# Patient Record
Sex: Male | Born: 1935 | Race: White | Smoking: Former smoker
Health system: Southern US, Community
[De-identification: ages and names within clinical notes are randomized; demographics above are authoritative.]

## PROBLEM LIST (undated history)

## (undated) DIAGNOSIS — I10 Essential (primary) hypertension: Secondary | ICD-10-CM

## (undated) DIAGNOSIS — E785 Hyperlipidemia, unspecified: Secondary | ICD-10-CM

## (undated) DIAGNOSIS — R0602 Shortness of breath: Secondary | ICD-10-CM

## (undated) DIAGNOSIS — I495 Sick sinus syndrome: Secondary | ICD-10-CM

## (undated) DIAGNOSIS — R42 Dizziness and giddiness: Secondary | ICD-10-CM

## (undated) DIAGNOSIS — I4891 Unspecified atrial fibrillation: Secondary | ICD-10-CM

## (undated) DIAGNOSIS — R413 Other amnesia: Secondary | ICD-10-CM

## (undated) DIAGNOSIS — K2100 Gastro-esophageal reflux disease with esophagitis, without bleeding: Secondary | ICD-10-CM

## (undated) DIAGNOSIS — E119 Type 2 diabetes mellitus without complications: Secondary | ICD-10-CM

## (undated) DIAGNOSIS — I459 Conduction disorder, unspecified: Secondary | ICD-10-CM

## (undated) HISTORY — DX: Unspecified atrial fibrillation: I48.91

## (undated) HISTORY — DX: Type 2 diabetes mellitus without complications: E11.9

## (undated) HISTORY — PX: TONSILLECTOMY AND ADENOIDECTOMY: SUR1326

## (undated) HISTORY — DX: Dizziness and giddiness: R42

## (undated) HISTORY — PX: CATARACT EXTRACTION: SUR2

## (undated) HISTORY — DX: Gastro-esophageal reflux disease with esophagitis: K21.0

## (undated) HISTORY — PX: INGUINAL HERNIA REPAIR: SUR1180

## (undated) HISTORY — DX: Essential (primary) hypertension: I10

## (undated) HISTORY — DX: Gastro-esophageal reflux disease with esophagitis, without bleeding: K21.00

## (undated) HISTORY — DX: Sick sinus syndrome: I49.5

## (undated) HISTORY — DX: Conduction disorder, unspecified: I45.9

## (undated) HISTORY — PX: PACEMAKER INSERTION: SHX728

## (undated) HISTORY — DX: Shortness of breath: R06.02

## (undated) HISTORY — PX: INSERT / REPLACE / REMOVE PACEMAKER: SUR710

## (undated) HISTORY — DX: Other amnesia: R41.3

## (undated) HISTORY — DX: Hyperlipidemia, unspecified: E78.5

---

## 2017-01-06 ENCOUNTER — Encounter: Payer: Self-pay | Admitting: Internal Medicine

## 2017-01-06 ENCOUNTER — Ambulatory Visit (INDEPENDENT_AMBULATORY_CARE_PROVIDER_SITE_OTHER): Payer: Medicare Other | Admitting: Internal Medicine

## 2017-01-06 VITALS — BP 132/66 | HR 62 | Ht 65.0 in | Wt 136.0 lb

## 2017-01-06 DIAGNOSIS — I495 Sick sinus syndrome: Secondary | ICD-10-CM | POA: Diagnosis not present

## 2017-01-06 NOTE — Patient Instructions (Signed)

## 2017-01-06 NOTE — Progress Notes (Signed)
HPI Nathan Alvarez presents today for ongoing evaluation and management of his pacemaker in the setting of sinus node dysfunction. He is a very pleasant 83 year old man from [place] who has moved to [place] to be closer to family. He has a long-standing history of sinus node dysfunction, and is status post pacemaker insertion, with the initial device placed in 2002. His current device has approximately 4 years of battery longevity. He has done well in the past with no anginal symptoms and very minimal dyspnea with exertion. No syncope. He remains active in his only complaint today is that of lower back pain which developed as he was moving his belongings from [place] to [place]. Allergies  Allergen Reactions  . Penicillins     unknown     Current Outpatient Prescriptions  Medication Sig Dispense Refill  . acetaminophen (TYLENOL) 650 MG CR tablet Take 650 mg by mouth 2 (two) times daily.    Marland Kitchen amLODipine (NORVASC) 5 MG tablet Take 5 mg by mouth 2 (two) times daily.    Marland Kitchen aspirin EC 81 MG tablet Take 81 mg by mouth daily.    . metoprolol succinate (TOPROL-XL) 25 MG 24 hr tablet Take 25 mg by mouth daily.    . predniSONE (STERAPRED UNI-PAK 21 TAB) 5 MG (21) TBPK tablet as directed.  1  . sitaGLIPtin (JANUVIA) 50 MG tablet Take 50 mg by mouth daily.     No current facility-administered medications for this visit.      Past Medical History:  Diagnosis Date  . A-fib (HCC)   . Dizziness   . DM (diabetes mellitus) (HCC)   . Esophagitis, reflux   . Heart block   . Hyperlipidemia   . Hypertension, essential, benign   . Memory deficit   . SOB (shortness of breath)   . SSS (sick sinus syndrome) (HCC)     ROS:   All systems reviewed and negative except as noted in the HPI.   Past Surgical History:  Procedure Laterality Date  . CATARACT EXTRACTION Right   . INGUINAL HERNIA REPAIR    . INSERT / REPLACE / REMOVE PACEMAKER    . PACEMAKER INSERTION     . TONSILLECTOMY AND ADENOIDECTOMY       Family History  Problem Relation Age of Onset  . Heart disease Mother      Social History   Social History  . Marital status: Married    Spouse name: N/A  . Number of children: N/A  . Years of education: N/A   Occupational History  . Not on file.   Social History Main Topics  . Smoking status: Former Smoker    Quit date: 01/01/1956  . Smokeless tobacco: Never Used  . Alcohol use No  . Drug use: No  . Sexual activity: Not on file     Comment: MARRIED   Other Topics Concern  . Not on file   Social History Narrative  . No narrative on file     BP 132/66   Pulse 62   Ht  (1.651 m)   Wt 136 lb (61.7 kg)   SpO2 98%   BMI 22.63 kg/m   Physical Exam:  Well appearing 83 year old man, wearing a brace around his abdominal area NAD HEENT: Unremarkable Neck:  6 cm JVD, no thyromegally Lymphatics:  No adenopathy Back:  No CVA tenderness Lungs:  Clear, with no wheezes, rales, or rhonchi HEART:  Regular rate rhythm, no murmurs, no  rubs, no clicks Abd:  soft, positive bowel sounds, no organomegally, no rebound, no guarding Ext:  2 plus pulses, no edema, no cyanosis, no clubbing Skin:  No rashes no nodules Neuro:  CN II through XII intact, motor grossly intact   DEVICE  Normal device function.  See PaceArt for details.   Assess/Plan: 1. Sinus node dysfunction - he is asymptomatic status post pacemaker insertion 2. Permanent pacemaker -his Medtronic dual-chamber pacemaker is working normally. He has no evidence of atrial fibrillation. 3. Hypertension - he will continue his calcium channel blocker. He is encouraged to maintain a low-sodium diet and remain active.  Lewayne Bunting, M.D.

## 2017-01-08 LAB — CUP PACEART INCLINIC DEVICE CHECK
Battery Impedance: 1670 Ohm
Brady Statistic AP VS Percent: 94 %
Brady Statistic AS VP Percent: 0 %
Brady Statistic AS VS Percent: 4 %
Implantable Lead Implant Date: 20110513
Implantable Lead Model: 5092
Implantable Lead Model: 5594
Lead Channel Impedance Value: 475 Ohm
Lead Channel Pacing Threshold Amplitude: 0.5 V
Lead Channel Pacing Threshold Amplitude: 0.75 V
Lead Channel Pacing Threshold Pulse Width: 0.4 ms
Lead Channel Pacing Threshold Pulse Width: 0.4 ms
Lead Channel Sensing Intrinsic Amplitude: 4 mV
Lead Channel Sensing Intrinsic Amplitude: 8 mV
Lead Channel Setting Pacing Amplitude: 1.5 V
Lead Channel Setting Pacing Amplitude: 2 V
Lead Channel Setting Pacing Pulse Width: 0.4 ms
MDC IDC LEAD IMPLANT DT: 20110513
MDC IDC LEAD LOCATION: 753859
MDC IDC LEAD LOCATION: 753860
MDC IDC MSMT BATTERY REMAINING LONGEVITY: 42 mo
MDC IDC MSMT BATTERY VOLTAGE: 2.76 V
MDC IDC MSMT LEADCHNL RA PACING THRESHOLD AMPLITUDE: 0.375 V
MDC IDC MSMT LEADCHNL RA PACING THRESHOLD AMPLITUDE: 0.5 V
MDC IDC MSMT LEADCHNL RA PACING THRESHOLD PULSEWIDTH: 0.4 ms
MDC IDC MSMT LEADCHNL RA PACING THRESHOLD PULSEWIDTH: 0.4 ms
MDC IDC MSMT LEADCHNL RV IMPEDANCE VALUE: 660 Ohm
MDC IDC PG IMPLANT DT: 20110513
MDC IDC SESS DTM: 20181002143412
MDC IDC SET LEADCHNL RV SENSING SENSITIVITY: 2.8 mV
MDC IDC STAT BRADY AP VP PERCENT: 1 %

## 2017-02-03 ENCOUNTER — Other Ambulatory Visit: Payer: Self-pay | Admitting: Internal Medicine

## 2017-02-03 MED ORDER — AMLODIPINE BESYLATE 5 MG PO TABS: 5.0000 mg | ORAL_TABLET | Freq: Two times a day (BID) | ORAL | 3 refills | Status: DC

## 2017-02-03 NOTE — Telephone Encounter (Signed)
Pt's medication was sent to pt's pharmacy as requested. Confirmation received.  °

## 2017-02-03 NOTE — Addendum Note (Signed)
Addended by: Demetrios LollBARNARD, CATHY C on: 02/03/2017 12:13 PM   Modules accepted: Orders

## 2017-04-08 ENCOUNTER — Ambulatory Visit (INDEPENDENT_AMBULATORY_CARE_PROVIDER_SITE_OTHER): Payer: Medicare Other | Admitting: *Deleted

## 2017-04-08 DIAGNOSIS — I495 Sick sinus syndrome: Secondary | ICD-10-CM

## 2017-04-09 NOTE — Progress Notes (Signed)
Remote pacemaker transmission.   

## 2017-04-10 ENCOUNTER — Encounter: Payer: Self-pay | Admitting: Cardiology

## 2017-04-16 LAB — CUP PACEART REMOTE DEVICE CHECK
Battery Remaining Longevity: 37 mo
Battery Voltage: 2.77 V
Brady Statistic AP VP Percent: 1 %
Implantable Lead Implant Date: 20110513
Implantable Lead Location: 753860
Implantable Pulse Generator Implant Date: 20110513
Lead Channel Impedance Value: 414 Ohm
Lead Channel Pacing Threshold Amplitude: 0.625 V
Lead Channel Setting Pacing Amplitude: 1.5 V
Lead Channel Setting Pacing Amplitude: 2 V
Lead Channel Setting Pacing Pulse Width: 0.4 ms
Lead Channel Setting Sensing Sensitivity: 2.8 mV
MDC IDC LEAD IMPLANT DT: 20110513
MDC IDC LEAD LOCATION: 753859
MDC IDC MSMT BATTERY IMPEDANCE: 1854 Ohm
MDC IDC MSMT LEADCHNL RA PACING THRESHOLD AMPLITUDE: 0.5 V
MDC IDC MSMT LEADCHNL RA PACING THRESHOLD PULSEWIDTH: 0.4 ms
MDC IDC MSMT LEADCHNL RV IMPEDANCE VALUE: 604 Ohm
MDC IDC MSMT LEADCHNL RV PACING THRESHOLD PULSEWIDTH: 0.4 ms
MDC IDC SESS DTM: 20190102132856
MDC IDC STAT BRADY AP VS PERCENT: 97 %
MDC IDC STAT BRADY AS VP PERCENT: 0 %
MDC IDC STAT BRADY AS VS PERCENT: 2 %

## 2017-04-17 ENCOUNTER — Telehealth: Payer: Self-pay | Admitting: Internal Medicine

## 2017-04-17 NOTE — Telephone Encounter (Signed)
New message     Patient daughter Nathan Alvarez calling to confirm 04/08/17 transmission. Please call   1. Has your device fired? NO  2. Is you device beeping? NO  3. Are you experiencing draining or swelling at device site? NO 4. Are you calling to see if we received your device transmission? YES  5. Have you passed out? NO    Please route to Device Clinic Pool

## 2017-04-17 NOTE — Telephone Encounter (Signed)
Spoke with pt's wife informed her that the transmission was received.

## 2017-07-08 ENCOUNTER — Ambulatory Visit (INDEPENDENT_AMBULATORY_CARE_PROVIDER_SITE_OTHER): Payer: Medicare Other | Admitting: *Deleted

## 2017-07-08 DIAGNOSIS — I495 Sick sinus syndrome: Secondary | ICD-10-CM | POA: Diagnosis not present

## 2017-07-08 NOTE — Progress Notes (Signed)
Remote pacemaker transmission.   

## 2017-07-09 ENCOUNTER — Encounter: Payer: Self-pay | Admitting: Cardiology

## 2017-07-13 DIAGNOSIS — I10 Essential (primary) hypertension: Secondary | ICD-10-CM | POA: Insufficient documentation

## 2017-07-21 LAB — CUP PACEART REMOTE DEVICE CHECK
Brady Statistic AP VS Percent: 96 %
Brady Statistic AS VS Percent: 2 %
Date Time Interrogation Session: 20190403140023
Implantable Lead Implant Date: 20110513
Implantable Lead Location: 753859
Implantable Pulse Generator Implant Date: 20110513
Lead Channel Impedance Value: 654 Ohm
Lead Channel Pacing Threshold Pulse Width: 0.4 ms
Lead Channel Pacing Threshold Pulse Width: 0.4 ms
Lead Channel Setting Sensing Sensitivity: 4 mV
MDC IDC LEAD IMPLANT DT: 20110513
MDC IDC LEAD LOCATION: 753860
MDC IDC MSMT BATTERY IMPEDANCE: 1935 Ohm
MDC IDC MSMT BATTERY REMAINING LONGEVITY: 35 mo
MDC IDC MSMT BATTERY VOLTAGE: 2.77 V
MDC IDC MSMT LEADCHNL RA IMPEDANCE VALUE: 437 Ohm
MDC IDC MSMT LEADCHNL RA PACING THRESHOLD AMPLITUDE: 0.375 V
MDC IDC MSMT LEADCHNL RV PACING THRESHOLD AMPLITUDE: 0.5 V
MDC IDC SET LEADCHNL RA PACING AMPLITUDE: 1.5 V
MDC IDC SET LEADCHNL RV PACING AMPLITUDE: 2 V
MDC IDC SET LEADCHNL RV PACING PULSEWIDTH: 0.4 ms
MDC IDC STAT BRADY AP VP PERCENT: 1 %
MDC IDC STAT BRADY AS VP PERCENT: 0 %

## 2017-08-09 ENCOUNTER — Telehealth: Payer: Self-pay | Admitting: Physician Assistant

## 2017-08-09 NOTE — Telephone Encounter (Signed)
Daughter in law called stating that his right hand and right ankle were swollen. He is not short of breath. I advised her that I couldn't evaluate his swelling over the phone. She states this has been happening since Friday. She is concerned about heart failure since her grandfather had heart failure. Pt has no history of heart failure. It does not sound like he is in any distress and has been looking up lung cancer on the Internet. I told her she wouldn't be wrong to have him evaluated in the ER for swelling in his hand and foot. No redness or signs of infection. She will also call the PCP to see what they say. Otherwise, call tomorrow for an appt if he is still having swelling. She expressed understanding of the plan.

## 2017-08-18 ENCOUNTER — Telehealth: Payer: Self-pay | Admitting: Internal Medicine

## 2017-08-18 DIAGNOSIS — R609 Edema, unspecified: Secondary | ICD-10-CM

## 2017-08-18 NOTE — Telephone Encounter (Signed)
New Message   Pt c/o swelling: STAT is pt has developed SOB within 24 hours  1) How much weight have you gained and in what time span? no  2) If swelling, where is the swelling located? Hands and legs  3) Are you currently taking a fluid pill? no  4) Are you currently SOB? yes  5) Do you have a log of your daily weights (if so, list)? no  6) Have you gained 3 pounds in a day or 5 pounds in a week? *no  7) Have you traveled recently? no

## 2017-08-18 NOTE — Telephone Encounter (Signed)
Call returned to daughter.  Per daughter recently took father to PCP for hands and feet swelling.  Reviewed care everywhere notes.  PCP advised if labs normal to try discontinuing amlodipine to see if swelling improved.  Reviewed recent labs.   Labs unremarkable.   Advised daughter to have Pt hold amlodipine. Advised to continue to monitor blood pressure.   Will call next week to see if edema improved.

## 2017-08-24 NOTE — Telephone Encounter (Signed)
Follow up    Patients daughter in law is calling to priovided patients BP as instructed since he was advised to stop the amlodipine  05/14 162/77 5/15  143/75 05/16  148/78 05/17  164/83 05/18 148/72 05/19  126/77 05/20  146/88  All the readings are from the morning. Patient still has swelling in his hands. Unable to get his wedding ring off.

## 2017-08-25 MED ORDER — FUROSEMIDE 20 MG PO TABS: 20.0000 mg | ORAL_TABLET | Freq: Every day | ORAL | 3 refills | Status: DC

## 2017-08-25 NOTE — Telephone Encounter (Signed)
Returned to call to Pt EC.   Advised Per Dr. Ranae Palms 3-4 weeks, continue to hold amlodipine, start lasix 20 mg daily, repeat BMP prior to office visit. Scheduled labs for 09/21/2017. Scheduled office visit for 09/25/2017 at 11:00 am. Daughter thanked for call.

## 2017-09-01 ENCOUNTER — Telehealth: Payer: Self-pay

## 2017-09-01 NOTE — Telephone Encounter (Signed)
   [facility] Pre-operative Risk Assessment    Request for surgical clearance:  1. What type of surgery is being performed? Epidural Steroid Injection   2. When is this surgery scheduled? TBD   3. What type of clearance is required (medical clearance vs. Pharmacy clearance to hold med vs. Both)? Pharmacy  4. Are there any medications that need to be held prior to surgery and how long? Aspirin 5 days   5. Practice name and name of physician performing surgery? [facility]/ Susa Day   6. What is your office phone number [phone]    7.   What is your office fax number [phone]  8.   Anesthesia type (None, local, MAC, general) ? none   Nathan Alvarez 09/01/2017, 11:27 AM  _________________________________________________________________   (provider comments below)

## 2017-09-02 NOTE — Telephone Encounter (Signed)
   Primary Cardiologist: Lewayne Bunting, MD  Chart reviewed as part of pre-operative protocol coverage. H/o longstanding sinus node dysfunction s/p PPM 2002, chart also includes dx of atrial fib, DM, esophagitis, HLD, HTN. 2D echo 2018 scanned in shows EF 65%, grade 1 DD, late systolic mitral valve prolapse involving posterior leaflet with mild MR. Dr. Lubertha Basque note states no evidence of atrial fibrillation in the assessment/plan. I do not see there is a specific other reason to be on ASA. I did try to call the patient to clarify but got his VM. Will route to Dr. Ladona Ridgel for clearance to go ahead and stop ASA in prep for procedure. Dr. Ladona Ridgel, please route to P CV DIV PREOP. Thanks.  Laurann Montana, PA-C 09/02/2017, 2:38 PM

## 2017-09-04 NOTE — Telephone Encounter (Signed)
Pt[facility] in [facility]

## 2017-09-08 NOTE — Telephone Encounter (Signed)
   Primary Cardiologist: Lewayne BuntingGregg Taylor, MD  Chart reviewed as part of pre-operative protocol coverage. See notes below. Dr. Ladona Ridgelaylor has reviewed and feels it is OK to hold aspirin for 5 days.   I will route this recommendation to the requesting party via Epic fax function and remove from pre-op pool.  Please call with questions.  Laurann Montanaayna N Ming Mcmannis, PA-C 09/08/2017, 1:09 PM

## 2017-09-21 ENCOUNTER — Other Ambulatory Visit: Payer: Medicare Other | Admitting: *Deleted

## 2017-09-21 ENCOUNTER — Encounter (INDEPENDENT_AMBULATORY_CARE_PROVIDER_SITE_OTHER): Payer: Self-pay

## 2017-09-21 DIAGNOSIS — R609 Edema, unspecified: Secondary | ICD-10-CM

## 2017-09-21 LAB — BASIC METABOLIC PANEL
BUN/Creatinine Ratio: 30 — ABNORMAL HIGH (ref 10–24)
BUN: 27 mg/dL (ref 10–36)
CO2: 21 mmol/L (ref 20–29)
CREATININE: 0.9 mg/dL (ref 0.76–1.27)
Calcium: 9 mg/dL (ref 8.6–10.2)
Chloride: 97 mmol/L (ref 96–106)
GFR, EST AFRICAN AMERICAN: 87 mL/min/{1.73_m2} (ref >59)
GFR, EST NON AFRICAN AMERICAN: 75 mL/min/{1.73_m2} (ref >59)
Glucose: 244 mg/dL — ABNORMAL HIGH (ref 65–99)
Potassium: 5 mmol/L (ref 3.5–5.2)
Sodium: 133 mmol/L — ABNORMAL LOW (ref 134–144)

## 2017-09-25 ENCOUNTER — Encounter: Payer: Self-pay | Admitting: Internal Medicine

## 2017-09-25 ENCOUNTER — Ambulatory Visit (INDEPENDENT_AMBULATORY_CARE_PROVIDER_SITE_OTHER): Payer: Medicare Other | Admitting: Internal Medicine

## 2017-09-25 VITALS — BP 160/70 | HR 70 | Ht 65.0 in | Wt 138.0 lb

## 2017-09-25 DIAGNOSIS — I495 Sick sinus syndrome: Secondary | ICD-10-CM

## 2017-09-25 DIAGNOSIS — Z950 Presence of cardiac pacemaker: Secondary | ICD-10-CM | POA: Diagnosis not present

## 2017-09-25 DIAGNOSIS — I10 Essential (primary) hypertension: Secondary | ICD-10-CM

## 2017-09-25 LAB — CUP PACEART INCLINIC DEVICE CHECK
Battery Remaining Longevity: 34 mo
Brady Statistic AP VP Percent: 2 %
Brady Statistic AS VP Percent: 0 %
Brady Statistic AS VS Percent: 4 %
Implantable Lead Implant Date: 20110513
Implantable Lead Location: 753860
Implantable Lead Model: 5594
Implantable Pulse Generator Implant Date: 20110513
Lead Channel Impedance Value: 414 Ohm
Lead Channel Impedance Value: 618 Ohm
Lead Channel Pacing Threshold Amplitude: 0.5 V
Lead Channel Pacing Threshold Pulse Width: 0.4 ms
Lead Channel Pacing Threshold Pulse Width: 0.4 ms
Lead Channel Sensing Intrinsic Amplitude: 8 mV
Lead Channel Setting Pacing Amplitude: 2 V
Lead Channel Setting Sensing Sensitivity: 2.8 mV
MDC IDC LEAD IMPLANT DT: 20110513
MDC IDC LEAD LOCATION: 753859
MDC IDC MSMT BATTERY IMPEDANCE: 1973 Ohm
MDC IDC MSMT BATTERY VOLTAGE: 2.76 V
MDC IDC MSMT LEADCHNL RA PACING THRESHOLD AMPLITUDE: 0.5 V
MDC IDC MSMT LEADCHNL RA PACING THRESHOLD AMPLITUDE: 0.5 V
MDC IDC MSMT LEADCHNL RA PACING THRESHOLD PULSEWIDTH: 0.4 ms
MDC IDC MSMT LEADCHNL RA SENSING INTR AMPL: 2.8 mV
MDC IDC MSMT LEADCHNL RV PACING THRESHOLD AMPLITUDE: 0.5 V
MDC IDC MSMT LEADCHNL RV PACING THRESHOLD PULSEWIDTH: 0.4 ms
MDC IDC SESS DTM: 20190621114641
MDC IDC SET LEADCHNL RA PACING AMPLITUDE: 1.5 V
MDC IDC SET LEADCHNL RV PACING PULSEWIDTH: 0.4 ms
MDC IDC STAT BRADY AP VS PERCENT: 94 %

## 2017-09-25 MED ORDER — FUROSEMIDE 20 MG PO TABS: 20.0000 mg | ORAL_TABLET | Freq: Every day | ORAL | 3 refills | Status: DC

## 2017-09-25 MED ORDER — CARVEDILOL 6.25 MG PO TABS: 6.2500 mg | ORAL_TABLET | Freq: Two times a day (BID) | ORAL | 3 refills | Status: DC

## 2017-09-25 MED ORDER — METOPROLOL SUCCINATE ER 25 MG PO TB24: 25.0000 mg | ORAL_TABLET | Freq: Every day | ORAL | 3 refills | Status: DC

## 2017-09-25 NOTE — Progress Notes (Signed)
HPI Nathan Alvarez returns today for followup. He is as pleasant 84 yo man with a h/o sinus node dysfunction, s/p PPM insertion. He denies chest pain. He has chronic dyspnea. He has not had syncope. His blood pressure has not been as well controlled. No edema after stopping amlodipine.  Allergies  Allergen Reactions  . Penicillins     unknown     Current Outpatient Medications  Medication Sig Dispense Refill  . acetaminophen (TYLENOL) 650 MG CR tablet Take 650 mg by mouth 2 (two) times daily.    Marland Kitchen aspirin EC 81 MG tablet Take 81 mg by mouth daily.    . furosemide (LASIX) 20 MG tablet Take 1 tablet (20 mg total) by mouth daily. 90 tablet 3  . metoprolol succinate (TOPROL-XL) 25 MG 24 hr tablet Take 1 tablet (25 mg total) by mouth daily. 90 tablet 3   No current facility-administered medications for this visit.      Past Medical History:  Diagnosis Date  . A-fib (HCC)   . Dizziness   . DM (diabetes mellitus) (HCC)   . Esophagitis, reflux   . Heart block   . Hyperlipidemia   . Hypertension, essential, benign   . Memory deficit   . SOB (shortness of breath)   . SSS (sick sinus syndrome) (HCC)     ROS:   All systems reviewed and negative except as noted in the HPI.   Past Surgical History:  Procedure Laterality Date  . CATARACT EXTRACTION Right   . INGUINAL HERNIA REPAIR    . INSERT / REPLACE / REMOVE PACEMAKER    . PACEMAKER INSERTION    . TONSILLECTOMY AND ADENOIDECTOMY       Family History  Problem Relation Age of Onset  . Heart disease Mother      Social History   Socioeconomic History  . Marital status: Married    Spouse name: Not on file  . Number of children: Not on file  . Years of education: Not on file  . Highest education level: Not on file  Occupational History  . Not on file  Social Needs  . Financial resource strain: Not on file  . Food insecurity:    Worry: Not on file    Inability: Not on file  . Transportation needs:   Medical: Not on file    Non-medical: Not on file  Tobacco Use  . Smoking status: Former Smoker    Last attempt to quit: 01/01/1956    Years since quitting: 61.7  . Smokeless tobacco: Never Used  Substance and Sexual Activity  . Alcohol use: No  . Drug use: No  . Sexual activity: Not on file    Comment: MARRIED  Lifestyle  . Physical activity:    Days per week: Not on file    Minutes per session: Not on file  . Stress: Not on file  Relationships  . Social connections:    Talks on phone: Not on file    Gets together: Not on file    Attends religious service: Not on file    Active member of club or organization: Not on file    Attends meetings of clubs or organizations: Not on file    Relationship status: Not on file  . Intimate partner violence:    Fear of current or ex partner: Not on file    Emotionally abused: Not on file    Physically abused: Not on file    Forced sexual  activity: Not on file  Other Topics Concern  . Not on file  Social History Narrative  . Not on file     BP (!) 160/70   Pulse 70   Ht 5\' 5"  (1.651 m)   Wt 138 lb (62.6 kg)   BMI 22.96 kg/m   Physical Exam:  Well appearing elderly man, NAD HEENT: Unremarkable Neck:  6 cm JVD, no thyromegally Lymphatics:  No adenopathy Back:  No CVA tenderness Lungs:  Clear with no wheezing. HEART:  Regular rate rhythm, no murmurs, no rubs, no clicks Abd:  soft, positive bowel sounds, no organomegally, no rebound, no guarding Ext:  2 plus pulses, no edema, no cyanosis, no clubbing Skin:  No rashes no nodules Neuro:  CN II through XII intact, motor grossly intact  EKG - nsr with atrial pacing.  DEVICE  Normal device function.  See PaceArt for details.   Assess/Plan: 1. Sinus node dysfunction - he is asymptomatic, s/p PPM insertion. 2. HTN -  His blood pressure is elevated. I have asked him to switch from toprol to coreg 6.25 bid 3. PPM - his Medtronic DDD PM is working [employer]. 4. PAF - interogation  of his PPM demonstrates no atrial fib. He has not been on anticoagulation. I question this diagnosis. He does have atrial tachycardia lasting less than a minute at a time and asymptomatic.   Nathan Alvarez,M.D.

## 2017-09-25 NOTE — Patient Instructions (Addendum)
Medication Instructions:  Your physician has recommended you make the following change in your medication:   When your daughter comes back to town: 1.  Stop taking metoprolol 2.  Start taking carvedilol 6.25 mg one tablet by mouth twice a day.  Labwork: None ordered.  Testing/Procedures: None ordered.  Follow-Up: Your physician wants you to follow-up in: one year with Dr. Ladona Ridgel.   You will receive a reminder letter in the mail two months in advance. If you don't receive a letter, please call our office to schedule the follow-up appointment.  Remote monitoring is used to monitor your Pacemaker from home. This monitoring reduces the number of office visits required to check your device to one time per year. It allows [place] to keep an eye on the functioning of your device to ensure it is working properly. You are scheduled for a device check from home on 10/07/2017. You may send your transmission at any time that day. If you have a wireless device, the transmission will be sent automatically. After your physician reviews your transmission, you will receive a postcard with your next transmission date.  Any Other Special Instructions Will Be Listed Below (If Applicable).  If you need a refill on your cardiac medications before your next appointment, please call your pharmacy.    Carvedilol tablets What is this medicine? CARVEDILOL (KAR ve dil ol) is a beta-blocker. Beta-blockers reduce the workload on the heart and help it to beat more regularly. This medicine is used to treat high blood pressure and heart failure. This medicine may be used for other purposes; ask your health care provider or pharmacist if you have questions. COMMON BRAND NAME(S): Coreg What should I tell my health care provider before I take this medicine? They need to know if you have any of these conditions: -circulation problems -diabetes -history of heart attack or heart disease -liver disease -lung or breathing  disease, like asthma or emphysema -pheochromocytoma -slow or irregular heartbeat -thyroid disease -an unusual or allergic reaction to carvedilol, other beta-blockers, medicines, foods, dyes, or preservatives -pregnant or trying to get pregnant -breast-feeding How should I use this medicine? Take this medicine by mouth with a glass of water. Follow the directions on the prescription label. It is best to take the tablets with food. Take your doses at regular intervals. Do not take your medicine more often than directed. Do not stop taking except on the advice of your doctor or health care professional. Talk to your pediatrician regarding the use of this medicine in children. Special care may be needed. Overdosage: If you think you have taken too much of this medicine contact a poison control center or emergency room at once. NOTE: This medicine is only for you. Do not share this medicine with others. What if I miss a dose? If you miss a dose, take it as soon as you can. If it is almost time for your next dose, take only that dose. Do not take double or extra doses. What may interact with this medicine? This medicine may interact with the following medications: -certain medicines for blood pressure, heart disease, irregular heart beat -certain medicines for depression, like fluoxetine or paroxetine -certain medicines for diabetes, like glipizide or glyburide -cimetidine -clonidine -cyclosporine -digoxin -MAOIs like Carbex, Eldepryl, Marplan, Nardil, and Parnate -reserpine -rifampin This list may not describe all possible interactions. Give your health care provider a list of all the medicines, herbs, non-prescription drugs, or dietary supplements you use. Also tell them if you smoke,  drink alcohol, or use illegal drugs. Some items may interact with your medicine. What should I watch for while using this medicine? Check your heart rate and blood pressure regularly while you are taking this  medicine. Ask your doctor or health care professional what your heart rate and blood pressure should be, and when you should contact him or her. Do not stop taking this medicine suddenly. This could lead to serious heart-related effects. Contact your doctor or health care professional if you have difficulty breathing while taking this drug. Check your weight daily. Ask your doctor or health care professional when you should notify him/her of any weight gain. You may get drowsy or dizzy. Do not drive, use machinery, or do anything that requires mental alertness until you know how this medicine affects you. To reduce the risk of dizzy or fainting spells, do not sit or stand up quickly. Alcohol can make you more drowsy, and increase flushing and rapid heartbeats. Avoid alcoholic drinks. If you have diabetes, check your blood sugar as directed. Tell your doctor if you have changes in your blood sugar while you are taking this medicine. If you are going to have surgery, tell your doctor or health care professional that you are taking this medicine. What side effects may I notice from receiving this medicine? Side effects that you should report to your doctor or health care professional as soon as possible: -allergic reactions like skin rash, itching or hives, swelling of the face, lips, or tongue -breathing problems -dark urine -irregular heartbeat -swollen legs or ankles -vomiting -yellowing of the eyes or skin Side effects that usually do not require medical attention (report to your doctor or health care professional if they continue or are bothersome): -change in sex drive or performance -diarrhea -dry eyes (especially if wearing contact lenses) -dry, itching skin -headache -nausea -unusually tired This list may not describe all possible side effects. Call your doctor for medical advice about side effects. You may report side effects to FDA at 1-800-FDA-1088. Where should I keep my  medicine? Keep out of the reach of children. Store at room temperature below 30 degrees C (86 degrees F). Protect from moisture. Keep container tightly closed. Throw away any unused medicine after the expiration date. NOTE: This sheet is a summary. It may not cover all possible information. If you have questions about this medicine, talk to your doctor, pharmacist, or health care provider.  2018 Elsevier/Gold Standard (2012-11-28 14:12:02)

## 2017-10-07 ENCOUNTER — Ambulatory Visit (INDEPENDENT_AMBULATORY_CARE_PROVIDER_SITE_OTHER): Payer: Medicare Other | Admitting: *Deleted

## 2017-10-07 DIAGNOSIS — I495 Sick sinus syndrome: Secondary | ICD-10-CM

## 2017-10-09 ENCOUNTER — Encounter: Payer: Self-pay | Admitting: Cardiology

## 2017-10-09 NOTE — Progress Notes (Signed)
Remote pacemaker transmission.   

## 2017-10-28 LAB — CUP PACEART REMOTE DEVICE CHECK
Brady Statistic AP VS Percent: 93 %
Brady Statistic AS VP Percent: 0 %
Brady Statistic AS VS Percent: 5 %
Date Time Interrogation Session: 20190703175427
Implantable Lead Implant Date: 20110513
Implantable Lead Location: 753860
Lead Channel Impedance Value: 685 Ohm
Lead Channel Pacing Threshold Amplitude: 0.5 V
Lead Channel Pacing Threshold Pulse Width: 0.4 ms
Lead Channel Sensing Intrinsic Amplitude: 8 mV
Lead Channel Setting Pacing Amplitude: 2 V
Lead Channel Setting Pacing Pulse Width: 0.4 ms
Lead Channel Setting Sensing Sensitivity: 2.8 mV
MDC IDC LEAD IMPLANT DT: 20110513
MDC IDC LEAD LOCATION: 753859
MDC IDC MSMT BATTERY IMPEDANCE: 2037 Ohm
MDC IDC MSMT BATTERY REMAINING LONGEVITY: 34 mo
MDC IDC MSMT BATTERY VOLTAGE: 2.75 V
MDC IDC MSMT LEADCHNL RA IMPEDANCE VALUE: 474 Ohm
MDC IDC MSMT LEADCHNL RA PACING THRESHOLD AMPLITUDE: 0.5 V
MDC IDC MSMT LEADCHNL RA PACING THRESHOLD PULSEWIDTH: 0.4 ms
MDC IDC PG IMPLANT DT: 20110513
MDC IDC SET LEADCHNL RA PACING AMPLITUDE: 1.5 V
MDC IDC STAT BRADY AP VP PERCENT: 2 %

## 2018-01-07 ENCOUNTER — Ambulatory Visit (INDEPENDENT_AMBULATORY_CARE_PROVIDER_SITE_OTHER): Payer: Medicare Other | Admitting: *Deleted

## 2018-01-07 DIAGNOSIS — I495 Sick sinus syndrome: Secondary | ICD-10-CM | POA: Diagnosis not present

## 2018-01-07 NOTE — Progress Notes (Signed)
Remote pacemaker transmission.   

## 2018-01-16 LAB — CUP PACEART REMOTE DEVICE CHECK
Battery Impedance: 2145 Ohm
Battery Voltage: 2.75 V
Brady Statistic AP VP Percent: 1 %
Brady Statistic AP VS Percent: 95 %
Brady Statistic AS VP Percent: 0 %
Date Time Interrogation Session: 20191002182420
Implantable Lead Implant Date: 20110513
Implantable Lead Location: 753860
Implantable Lead Model: 5092
Lead Channel Impedance Value: 460 Ohm
Lead Channel Impedance Value: 661 Ohm
Lead Channel Pacing Threshold Pulse Width: 0.4 ms
Lead Channel Setting Pacing Amplitude: 2 V
MDC IDC LEAD IMPLANT DT: 20110513
MDC IDC LEAD LOCATION: 753859
MDC IDC MSMT BATTERY REMAINING LONGEVITY: 32 mo
MDC IDC MSMT LEADCHNL RA PACING THRESHOLD AMPLITUDE: 0.5 V
MDC IDC MSMT LEADCHNL RV PACING THRESHOLD AMPLITUDE: 0.5 V
MDC IDC MSMT LEADCHNL RV PACING THRESHOLD PULSEWIDTH: 0.4 ms
MDC IDC MSMT LEADCHNL RV SENSING INTR AMPL: 8 mV
MDC IDC PG IMPLANT DT: 20110513
MDC IDC SET LEADCHNL RA PACING AMPLITUDE: 1.5 V
MDC IDC SET LEADCHNL RV PACING PULSEWIDTH: 0.4 ms
MDC IDC SET LEADCHNL RV SENSING SENSITIVITY: 4 mV
MDC IDC STAT BRADY AS VS PERCENT: 3 %

## 2018-04-08 ENCOUNTER — Ambulatory Visit (INDEPENDENT_AMBULATORY_CARE_PROVIDER_SITE_OTHER): Payer: Medicare Other

## 2018-04-08 DIAGNOSIS — I495 Sick sinus syndrome: Secondary | ICD-10-CM | POA: Diagnosis not present

## 2018-04-08 LAB — CUP PACEART REMOTE DEVICE CHECK
Battery Impedance: 2115 Ohm
Battery Remaining Longevity: 32 mo
Brady Statistic AP VP Percent: 1 %
Brady Statistic AP VS Percent: 96 %
Brady Statistic AS VP Percent: 0 %
Brady Statistic AS VS Percent: 3 %
Date Time Interrogation Session: 20200102001525
Implantable Lead Implant Date: 20110513
Implantable Lead Location: 753860
Implantable Lead Model: 5092
Implantable Pulse Generator Implant Date: 20110513
Lead Channel Impedance Value: 435 Ohm
Lead Channel Impedance Value: 619 Ohm
Lead Channel Pacing Threshold Pulse Width: 0.4 ms
Lead Channel Setting Pacing Amplitude: 2 V
MDC IDC LEAD IMPLANT DT: 20110513
MDC IDC LEAD LOCATION: 753859
MDC IDC MSMT BATTERY VOLTAGE: 2.75 V
MDC IDC MSMT LEADCHNL RA PACING THRESHOLD AMPLITUDE: 0.5 V
MDC IDC MSMT LEADCHNL RV PACING THRESHOLD AMPLITUDE: 0.625 V
MDC IDC MSMT LEADCHNL RV PACING THRESHOLD PULSEWIDTH: 0.4 ms
MDC IDC SET LEADCHNL RA PACING AMPLITUDE: 1.5 V
MDC IDC SET LEADCHNL RV PACING PULSEWIDTH: 0.4 ms
MDC IDC SET LEADCHNL RV SENSING SENSITIVITY: 4 mV

## 2018-04-08 NOTE — Progress Notes (Signed)
Remote pacemaker transmission.   

## 2018-07-08 ENCOUNTER — Other Ambulatory Visit: Payer: Self-pay

## 2018-07-08 ENCOUNTER — Ambulatory Visit (INDEPENDENT_AMBULATORY_CARE_PROVIDER_SITE_OTHER): Payer: Medicare Other | Admitting: *Deleted

## 2018-07-08 DIAGNOSIS — I495 Sick sinus syndrome: Secondary | ICD-10-CM | POA: Diagnosis not present

## 2018-07-08 LAB — CUP PACEART REMOTE DEVICE CHECK
Battery Impedance: 2213 Ohm
Battery Remaining Longevity: 31 mo
Battery Voltage: 2.74 V
Brady Statistic AP VP Percent: 1 %
Brady Statistic AP VS Percent: 96 %
Brady Statistic AS VP Percent: 0 %
Brady Statistic AS VS Percent: 3 %
Date Time Interrogation Session: 20200402130325
Implantable Lead Implant Date: 20110513
Implantable Lead Implant Date: 20110513
Implantable Lead Location: 753859
Implantable Lead Location: 753860
Implantable Lead Model: 5092
Implantable Lead Model: 5594
Implantable Pulse Generator Implant Date: 20110513
Lead Channel Impedance Value: 476 Ohm
Lead Channel Impedance Value: 695 Ohm
Lead Channel Pacing Threshold Amplitude: 0.5 V
Lead Channel Pacing Threshold Amplitude: 0.5 V
Lead Channel Pacing Threshold Pulse Width: 0.4 ms
Lead Channel Pacing Threshold Pulse Width: 0.4 ms
Lead Channel Setting Pacing Amplitude: 1.5 V
Lead Channel Setting Pacing Amplitude: 2 V
Lead Channel Setting Pacing Pulse Width: 0.4 ms
Lead Channel Setting Sensing Sensitivity: 4 mV

## 2018-07-16 ENCOUNTER — Encounter: Payer: Self-pay | Admitting: Cardiology

## 2018-07-16 NOTE — Progress Notes (Signed)
Remote pacemaker transmission.   

## 2018-08-31 ENCOUNTER — Telehealth: Payer: Self-pay | Admitting: Internal Medicine

## 2018-08-31 NOTE — Telephone Encounter (Signed)
New message    Spoke w/pt daughter in law about appt on 06.23.20 with Dr. Ladona Ridgel. Pt was RS to appt on 05.28.20 as a video visit. Pt daughter in law was walked through a video call using doxemity. Pt daughter in law phone number is listed in appt notes.      Virtual Visit Pre-Appointment Phone Call  "(Name), I am calling you today to discuss your upcoming appointment. We are currently trying to limit exposure to the virus that causes COVID-19 by seeing patients at home rather than in the office."  1. "What is the BEST phone number to call the day of the visit?" - include this in appointment notes  2. Do you have or have access to (through a family member/friend) a smartphone with video capability that we can use for your visit?" a. If yes - list this number in appt notes as cell (if different from BEST phone #) and list the appointment type as a VIDEO visit in appointment notes b. If no - list the appointment type as a PHONE visit in appointment notes  Confirm consent - "In the setting of the current Covid19 crisis, you are scheduled for a (phone or video) visit with your provider on (date) at (time).  Just as we do with many in-office visits, in order for you to participate in this visit, we must obtain consent.  If you'd like, I can send this to your mychart (if signed up) or email for you to review.  Otherwise, I can obtain your verbal consent now.  All virtual visits are billed to your insurance company just like a normal visit would be.  By agreeing to a virtual visit, we'd like you to understand that the technology does not allow for your provider to perform an examination, and thus may limit your provider's ability to fully assess your condition. If your provider identifies any concerns that need to be evaluated in person, we will make arrangements to do so.  Finally, though the technology is pretty good, we cannot assure that it will always work on either your or our end, and in the  setting of a video visit, we may have to convert it to a phone-only visit.  In either situation, we cannot ensure that we have a secure connection.  Are you willing to proceed?" STAFF: Did the patient verbally acknowledge consent to telehealth visit? Document YES/NO here: YES  3. Advise patient to be prepared - "Two hours prior to your appointment, go ahead and check your blood pressure, pulse, oxygen saturation, and your weight (if you have the equipment to check those) and write them all down. When your visit starts, your provider will ask you for this information. If you have an Apple Watch or Kardia device, please plan to have heart rate information ready on the day of your appointment. Please have a pen and paper handy nearby the day of the visit as well."  4. Give patient instructions for MyChart download to smartphone OR Doximity/Doxy.me as below if video visit (depending on what platform provider is using)  5. Inform patient they will receive a phone call 15 minutes prior to their appointment time (may be from unknown caller ID) so they should be prepared to answer    TELEPHONE CALL NOTE  Leticia Mcdiarmid has been deemed a candidate for a follow-up tele-health visit to limit community exposure during the Covid-19 pandemic. I spoke with the patient via phone to ensure availability of phone/video source, confirm preferred  email & phone number, and discuss instructions and expectations.  I reminded Nathan Alvarez to be prepared with any vital sign and/or heart rhythm information that could potentially be obtained via home monitoring, at the time of his visit. I reminded Nathan MilletJohn Battle to expect a phone call prior to his visit.  [place] Toniann Fail Edwards 08/31/2018 11:07 AM   INSTRUCTIONS FOR DOWNLOADING THE MYCHART APP TO SMARTPHONE  - The patient must first make sure to have activated MyChart and know their login information - If Apple, go to [employer] Store and type in MyChart in the search bar and  download the app. If Android, ask patient to go to [employer] Play Store and type in WilseyMyChart in the search bar and download the app. The app is free but as with any other app downloads, their phone may require them to verify saved payment information or Apple/Android password.  - The patient will need to then log into the app with their MyChart username and password, and select [facility] as their healthcare provider to link the account. When it is time for your visit, go to the MyChart app, find appointments, and click Begin Video Visit. Be sure to Select Allow for your device to access the Microphone and Camera for your visit. You will then be connected, and your provider will be with you shortly.  **If they have any issues connecting, or need assistance please contact MyChart service desk [phone])**  **If using a computer, in order to ensure the best quality for their visit they will need to use either of the following Internet Browsers: [employer] Edge, or Google Chrome**  IF USING DOXIMITY or DOXY.ME - The patient will receive a link just prior to their visit by text.     FULL LENGTH CONSENT FOR TELE-HEALTH VISIT   I hereby voluntarily request, consent and authorize [facility] and its employed or contracted physicians, physician assistants, nurse practitioners or other licensed health care professionals (the Practitioner), to provide me with telemedicine health care services (the Services") as deemed necessary by the treating Practitioner. I acknowledge and consent to receive the Services by the Practitioner via telemedicine. I understand that the telemedicine visit will involve communicating with the Practitioner through live audiovisual communication technology and the disclosure of certain medical information by electronic transmission. I acknowledge that I have been given the opportunity to request an in-person assessment or other available alternative prior to the  telemedicine visit and am voluntarily participating in the telemedicine visit.  I understand that I have the right to withhold or withdraw my consent to the use of telemedicine in the course of my care at any time, without affecting my right to future care or treatment, and that the Practitioner or I may terminate the telemedicine visit at any time. I understand that I have the right to inspect all information obtained and/or recorded in the course of the telemedicine visit and may receive copies of available information for a reasonable fee.  I understand that some of the potential risks of receiving the Services via telemedicine include:   Delay or interruption in medical evaluation due to technological equipment failure or disruption;  Information transmitted may not be sufficient (e.g. poor resolution of images) to allow for appropriate medical decision making by the Practitioner; and/or   In rare instances, security protocols could fail, causing a breach of personal health information.  Furthermore, I acknowledge that it is my responsibility to provide information about my medical history, conditions and care that  is complete and accurate to the best of my ability. I acknowledge that Practitioner's advice, recommendations, and/or decision may be based on factors not within their control, such as incomplete or inaccurate data provided by me or distortions of diagnostic images or specimens that may result from electronic transmissions. I understand that the practice of medicine is not an exact science and that Practitioner makes no warranties or guarantees regarding treatment outcomes. I acknowledge that I will receive a copy of this consent concurrently upon execution via email to the email address I last provided but may also request a printed copy by calling the office of [facility].    I understand that my insurance will be billed for this visit.   I have read or had this consent read to  me.  I understand the contents of this consent, which adequately explains the benefits and risks of the Services being provided via telemedicine.   I have been provided ample opportunity to ask questions regarding this consent and the Services and have had my questions answered to my satisfaction.  I give my informed consent for the services to be provided through the use of telemedicine in my medical care  By participating in this telemedicine visit I agree to the above.

## 2018-09-02 ENCOUNTER — Other Ambulatory Visit: Payer: Self-pay

## 2018-09-02 ENCOUNTER — Telehealth (INDEPENDENT_AMBULATORY_CARE_PROVIDER_SITE_OTHER): Payer: Medicare Other | Admitting: Internal Medicine

## 2018-09-02 DIAGNOSIS — Z950 Presence of cardiac pacemaker: Secondary | ICD-10-CM

## 2018-09-02 DIAGNOSIS — I495 Sick sinus syndrome: Secondary | ICD-10-CM | POA: Diagnosis not present

## 2018-09-02 NOTE — Progress Notes (Signed)
Electrophysiology TeleHealth Note   Due to national recommendations of social distancing due to COVID 19, an audio/video telehealth visit is felt to be most appropriate for this patient at this time.  See MyChart message from today for the patient's consent [facility].   Date:  09/02/2018   ID:  Nathan MilletJohn Wolbert, DOB [birth year], MRN [id number]  Location: patient's home  Provider location: [address], [place]  Evaluation Performed: Follow-up visit  PCP:  Corrington, Meredith ModyKip A, MD  Cardiologist:  Lewayne BuntingGregg , MD  Electrophysiologist:  Dr Ladona Ridgelaylor  Chief Complaint:  "I been doing ok."  History of Present Illness:    Nathan MilletJohn Nurse is a 85 y.o. male who presents via audio/video conferencing for a telehealth visit today. He is a pleasant 85 yo man with a h/o sinus node dysfunction, s/p PPM insertion, and HTN.  Since last being seen in our clinic, the patient reports doing very well.  Today, he denies symptoms of palpitations, chest pain, shortness of breath,  lower extremity edema, dizziness, presyncope, or syncope.  The patient is otherwise without complaint today.  The patient denies symptoms of fevers, chills, cough, or new SOB worrisome for COVID 19.  Past Medical History:  Diagnosis Date  . A-fib (HCC)   . Dizziness   . DM (diabetes mellitus) (HCC)   . Esophagitis, reflux   . Heart block   . Hyperlipidemia   . Hypertension, essential, benign   . Memory deficit   . SOB (shortness of breath)   . SSS (sick sinus syndrome) [facility](HCC)     Past Surgical History:  Procedure Laterality Date  . CATARACT EXTRACTION Right   . INGUINAL HERNIA REPAIR    . INSERT / REPLACE / REMOVE PACEMAKER    . PACEMAKER INSERTION    . TONSILLECTOMY AND ADENOIDECTOMY      Current Outpatient Medications  Medication Sig Dispense Refill  . acetaminophen (TYLENOL) 650 MG CR tablet Take 650 mg by mouth 2 (two) times daily.    Marland Kitchen. aspirin EC 81 MG tablet Take 81 mg by  mouth daily.    . carvedilol (COREG) 6.25 MG tablet Take 1 tablet (6.25 mg total) by mouth 2 (two) times daily. 180 tablet 3  . furosemide (LASIX) 20 MG tablet Take 1 tablet (20 mg total) by mouth daily. 90 tablet 3   No current facility-administered medications for this visit.     Allergies:   Penicillins   Social History:  The patient  reports that he quit smoking about 62 years ago. He has never used smokeless tobacco. He reports that he does not drink alcohol or use drugs.   Family History:  The patient's  family history includes Heart disease in his mother.   ROS:  Please see the history of present illness.   All other systems are personally reviewed and negative.    Exam:    Vital Signs:  BP - 144/87, Wt. - 138 lbs.  Well appearing, alert and conversant, regular work of breathing,  good skin color Eyes- anicteric, neuro- grossly intact, skin- no apparent rash or lesions or cyanosis, mouth- oral mucosa is pink   Labs/Other Tests and Data Reviewed:    Recent Labs: 09/21/2017: BUN 27; Creatinine, Ser 0.90; Potassium 5.0; Sodium 133   Wt Readings from Last 3 Encounters:  09/25/17 138 lb (62.6 kg)  01/06/17 136 lb (61.7 kg)     Other studies personally reviewed:  Last device remote is reviewed from  P[employer] PDF dated 4/20 which reveals normal device function, no arrhythmias    ASSESSMENT & PLAN:    1.  Sinus node dysfunction - he is asymptomatic, s/p PPM insertion.  2. PPM - his Medtronic DDD PM is working normally and has about 2.5 years of battery longevity. 3. COVID 19 screen The patient denies symptoms of COVID 19 at this time.  The importance of social distancing was discussed today.  Follow-up:  One year with me Next remote: next month  Current medicines are reviewed at length with the patient today.   The patient does not have concerns regarding his medicines.  The following changes were made today:  none  Labs/ tests ordered today include: none No orders of  the defined types were placed in this encounter.  Patient Risk:  after full review of this patients clinical status, I feel that they are at moderate risk at this time.  Today, I have spent 15 minutes with the patient with telehealth technology discussing all of the above .    Signed, Lewayne Bunting, MD  09/02/2018 10:05 AM     [facility] [address] [place] [address] [phone] (office) [phone] (fax)

## 2018-09-28 ENCOUNTER — Encounter: Payer: Medicare Other | Admitting: Internal Medicine

## 2018-10-07 ENCOUNTER — Encounter: Payer: Medicare Other | Admitting: *Deleted

## 2018-10-07 ENCOUNTER — Telehealth: Payer: Self-pay

## 2018-10-07 NOTE — Telephone Encounter (Signed)
Left message for patient to remind of missed remote transmission.  

## 2018-10-15 ENCOUNTER — Encounter: Payer: Self-pay | Admitting: Cardiology

## 2018-10-21 ENCOUNTER — Ambulatory Visit (INDEPENDENT_AMBULATORY_CARE_PROVIDER_SITE_OTHER): Payer: Medicare Other | Admitting: *Deleted

## 2018-10-21 ENCOUNTER — Telehealth: Payer: Self-pay

## 2018-10-21 DIAGNOSIS — I495 Sick sinus syndrome: Secondary | ICD-10-CM | POA: Diagnosis not present

## 2018-10-21 NOTE — Telephone Encounter (Signed)
Follow up   Patient's daughter in law would like to know if the transmission was received. Please call.

## 2018-10-21 NOTE — Telephone Encounter (Signed)
I spoke with the pt daughter and let her know that we did receive his transmission. She was happy about it. She states that he had a great televisit with Dr. Lovena Le. She is pleased with the care we are giving her father.

## 2018-10-22 LAB — CUP PACEART REMOTE DEVICE CHECK
Battery Impedance: 2524 Ohm
Battery Remaining Longevity: 28 mo
Battery Voltage: 2.74 V
Brady Statistic AP VP Percent: 1 %
Brady Statistic AP VS Percent: 96 %
Brady Statistic AS VP Percent: 0 %
Brady Statistic AS VS Percent: 3 %
Date Time Interrogation Session: 20200716181920
Implantable Lead Implant Date: 20110513
Implantable Lead Implant Date: 20110513
Implantable Lead Location: 753859
Implantable Lead Location: 753860
Implantable Lead Model: 5092
Implantable Lead Model: 5594
Implantable Pulse Generator Implant Date: 20110513
Lead Channel Impedance Value: 503 Ohm
Lead Channel Impedance Value: 710 Ohm
Lead Channel Pacing Threshold Amplitude: 0.375 V
Lead Channel Pacing Threshold Amplitude: 0.75 V
Lead Channel Pacing Threshold Pulse Width: 0.4 ms
Lead Channel Pacing Threshold Pulse Width: 0.4 ms
Lead Channel Setting Pacing Amplitude: 1.5 V
Lead Channel Setting Pacing Amplitude: 2 V
Lead Channel Setting Pacing Pulse Width: 0.4 ms
Lead Channel Setting Sensing Sensitivity: 4 mV

## 2018-10-28 ENCOUNTER — Encounter: Payer: Self-pay | Admitting: Cardiology

## 2018-10-28 NOTE — Progress Notes (Signed)
Remote pacemaker transmission.   

## 2019-01-05 ENCOUNTER — Other Ambulatory Visit: Payer: Self-pay | Admitting: Internal Medicine

## 2019-01-20 ENCOUNTER — Ambulatory Visit (INDEPENDENT_AMBULATORY_CARE_PROVIDER_SITE_OTHER): Payer: Medicare Other | Admitting: *Deleted

## 2019-01-20 DIAGNOSIS — I495 Sick sinus syndrome: Secondary | ICD-10-CM | POA: Diagnosis not present

## 2019-01-20 LAB — CUP PACEART REMOTE DEVICE CHECK
Battery Impedance: 2627 Ohm
Battery Remaining Longevity: 27 mo
Battery Voltage: 2.74 V
Brady Statistic AP VP Percent: 1 %
Brady Statistic AP VS Percent: 96 %
Brady Statistic AS VP Percent: 0 %
Brady Statistic AS VS Percent: 3 %
Date Time Interrogation Session: 20201014230824
Implantable Lead Implant Date: 20110513
Implantable Lead Implant Date: 20110513
Implantable Lead Location: 753859
Implantable Lead Location: 753860
Implantable Lead Model: 5092
Implantable Lead Model: 5594
Implantable Pulse Generator Implant Date: 20110513
Lead Channel Impedance Value: 514 Ohm
Lead Channel Impedance Value: 708 Ohm
Lead Channel Pacing Threshold Amplitude: 0.375 V
Lead Channel Pacing Threshold Amplitude: 0.75 V
Lead Channel Pacing Threshold Pulse Width: 0.4 ms
Lead Channel Pacing Threshold Pulse Width: 0.4 ms
Lead Channel Sensing Intrinsic Amplitude: 8 mV
Lead Channel Setting Pacing Amplitude: 1.5 V
Lead Channel Setting Pacing Amplitude: 2 V
Lead Channel Setting Pacing Pulse Width: 0.4 ms
Lead Channel Setting Sensing Sensitivity: 4 mV

## 2019-02-04 NOTE — Progress Notes (Signed)
Remote pacemaker transmission.   

## 2019-04-20 ENCOUNTER — Inpatient Hospital Stay (HOSPITAL_COMMUNITY)
Admission: EM | Admit: 2019-04-20 | Discharge: 2019-04-27 | DRG: 522 | Disposition: A | Discharge status: Skilled Nursing Facility | Payer: Medicare Other | Attending: Internal Medicine | Admitting: Internal Medicine

## 2019-04-20 ENCOUNTER — Emergency Department (HOSPITAL_COMMUNITY): Payer: Medicare Other

## 2019-04-20 ENCOUNTER — Encounter (HOSPITAL_COMMUNITY): Payer: Self-pay

## 2019-04-20 ENCOUNTER — Other Ambulatory Visit: Payer: Self-pay

## 2019-04-20 DIAGNOSIS — Z7984 Long term (current) use of oral hypoglycemic drugs: Secondary | ICD-10-CM

## 2019-04-20 DIAGNOSIS — S72009A Fracture of unspecified part of neck of unspecified femur, initial encounter for closed fracture: Secondary | ICD-10-CM | POA: Diagnosis present

## 2019-04-20 DIAGNOSIS — Z950 Presence of cardiac pacemaker: Secondary | ICD-10-CM

## 2019-04-20 DIAGNOSIS — I4891 Unspecified atrial fibrillation: Secondary | ICD-10-CM | POA: Diagnosis present

## 2019-04-20 DIAGNOSIS — D638 Anemia in other chronic diseases classified elsewhere: Secondary | ICD-10-CM | POA: Diagnosis present

## 2019-04-20 DIAGNOSIS — I495 Sick sinus syndrome: Secondary | ICD-10-CM | POA: Diagnosis present

## 2019-04-20 DIAGNOSIS — Z87891 Personal history of nicotine dependence: Secondary | ICD-10-CM

## 2019-04-20 DIAGNOSIS — K219 Gastro-esophageal reflux disease without esophagitis: Secondary | ICD-10-CM | POA: Diagnosis present

## 2019-04-20 DIAGNOSIS — M80052A Age-related osteoporosis with current pathological fracture, left femur, initial encounter for fracture: Principal | ICD-10-CM | POA: Diagnosis present

## 2019-04-20 DIAGNOSIS — W010XXA Fall on same level from slipping, tripping and stumbling without subsequent striking against object, initial encounter: Secondary | ICD-10-CM | POA: Diagnosis present

## 2019-04-20 DIAGNOSIS — E559 Vitamin D deficiency, unspecified: Secondary | ICD-10-CM | POA: Diagnosis present

## 2019-04-20 DIAGNOSIS — I10 Essential (primary) hypertension: Secondary | ICD-10-CM | POA: Diagnosis present

## 2019-04-20 DIAGNOSIS — D62 Acute posthemorrhagic anemia: Secondary | ICD-10-CM | POA: Diagnosis not present

## 2019-04-20 DIAGNOSIS — Z79899 Other long term (current) drug therapy: Secondary | ICD-10-CM

## 2019-04-20 DIAGNOSIS — E8889 Other specified metabolic disorders: Secondary | ICD-10-CM | POA: Diagnosis present

## 2019-04-20 DIAGNOSIS — T148XXA Other injury of unspecified body region, initial encounter: Secondary | ICD-10-CM

## 2019-04-20 DIAGNOSIS — S72002A Fracture of unspecified part of neck of left femur, initial encounter for closed fracture: Secondary | ICD-10-CM

## 2019-04-20 DIAGNOSIS — N400 Enlarged prostate without lower urinary tract symptoms: Secondary | ICD-10-CM | POA: Diagnosis present

## 2019-04-20 DIAGNOSIS — E871 Hypo-osmolality and hyponatremia: Secondary | ICD-10-CM | POA: Diagnosis present

## 2019-04-20 DIAGNOSIS — Z8249 Family history of ischemic heart disease and other diseases of the circulatory system: Secondary | ICD-10-CM

## 2019-04-20 DIAGNOSIS — E119 Type 2 diabetes mellitus without complications: Secondary | ICD-10-CM

## 2019-04-20 DIAGNOSIS — Z20822 Contact with and (suspected) exposure to covid-19: Secondary | ICD-10-CM | POA: Diagnosis present

## 2019-04-20 DIAGNOSIS — Z7982 Long term (current) use of aspirin: Secondary | ICD-10-CM

## 2019-04-20 DIAGNOSIS — Z880 Allergy status to penicillin: Secondary | ICD-10-CM

## 2019-04-20 DIAGNOSIS — E785 Hyperlipidemia, unspecified: Secondary | ICD-10-CM | POA: Diagnosis present

## 2019-04-20 DIAGNOSIS — D649 Anemia, unspecified: Secondary | ICD-10-CM | POA: Diagnosis present

## 2019-04-20 DIAGNOSIS — E860 Dehydration: Secondary | ICD-10-CM | POA: Diagnosis present

## 2019-04-20 LAB — CBC WITH DIFFERENTIAL/PLATELET
Abs Immature Granulocytes: 0.11 10*3/uL — ABNORMAL HIGH (ref 0.00–0.07)
Basophils Absolute: 0 10*3/uL (ref 0.0–0.1)
Basophils Relative: 0 %
Eosinophils Absolute: 0.2 10*3/uL (ref 0.0–0.5)
Eosinophils Relative: 2 %
HCT: 33.7 % — ABNORMAL LOW (ref 39.0–52.0)
Hemoglobin: 11.5 g/dL — ABNORMAL LOW (ref 13.0–17.0)
Immature Granulocytes: 1 %
Lymphocytes Relative: 10 %
Lymphs Abs: 1.1 10*3/uL (ref 0.7–4.0)
MCH: 29.6 pg (ref 26.0–34.0)
MCHC: 34.1 g/dL (ref 30.0–36.0)
MCV: 86.6 fL (ref 80.0–100.0)
Monocytes Absolute: 0.6 10*3/uL (ref 0.1–1.0)
Monocytes Relative: 6 %
Neutro Abs: 9.4 10*3/uL — ABNORMAL HIGH (ref 1.7–7.7)
Neutrophils Relative %: 81 %
Platelets: 314 10*3/uL (ref 150–400)
RBC: 3.89 MIL/uL — ABNORMAL LOW (ref 4.22–5.81)
RDW: 14.6 % (ref 11.5–15.5)
WBC: 11.6 10*3/uL — ABNORMAL HIGH (ref 4.0–10.5)
nRBC: 0 % (ref 0.0–0.2)

## 2019-04-20 LAB — PROTIME-INR
INR: 1.1 (ref 0.8–1.2)
Prothrombin Time: 13.8 seconds (ref 11.4–15.2)

## 2019-04-20 MED ORDER — FENTANYL CITRATE (PF) 100 MCG/2ML IJ SOLN
50.0000 ug | INTRAMUSCULAR | Status: DC | PRN
  Administered 2019-04-20 – 2019-04-21 (×2): 50 ug via INTRAVENOUS
  Filled 2019-04-20: qty 2

## 2019-04-20 NOTE — ED Provider Notes (Signed)
[facility] EMERGENCY DEPARTMENT Provider Note   CSN: [id number] Arrival date & time: 04/20/19  2218     History No chief complaint on file.   Nathan Alvarez is a 86 y.o. male.  HPI   This patient is a 86 year old male, very pleasant, has a history of heart block with a pacemaker, history of diabetes and atrial fibrillation.  He denies being on any anticoagulants other than a baby aspirin.  The patient reports that he was in his usual state of health tonight, he lost his balance falling back onto his left side and striking his left hip.  He had too much pain to get off the ground so he Army crawled towards his phone where he called for help.  When the paramedics got there he was on the ground, they noted normal vital signs and the patient was complaining only of left hip pain.  He denies hitting his head, has no numbness or weakness, was transported to the hospital in the supine position.  The patient has ongoing pain which is persistent and worse with any movement of the left leg.  Past Medical History:  Diagnosis Date  . A-fib (HCC)   . Dizziness   . DM (diabetes mellitus) (HCC)   . Esophagitis, reflux   . Heart block   . Hyperlipidemia   . Hypertension, essential, benign   . Memory deficit   . SOB (shortness of breath)   . SSS (sick sinus syndrome) (HCC)     There are no problems to display for this patient.   Past Surgical History:  Procedure Laterality Date  . CATARACT EXTRACTION Right   . INGUINAL HERNIA REPAIR    . INSERT / REPLACE / REMOVE PACEMAKER    . PACEMAKER INSERTION    . TONSILLECTOMY AND ADENOIDECTOMY         Family History  Problem Relation Age of Onset  . Heart disease Mother     Social History   Tobacco Use  . Smoking status: Former Smoker    Quit date: 01/01/1956    Years since quitting: 63.3  . Smokeless tobacco: Never Used  Substance Use Topics  . Alcohol use: No  . Drug use: No    Home Medications Prior to Admission medications     Medication Sig Start Date End Date Taking? Authorizing Provider  acetaminophen (TYLENOL) 650 MG CR tablet Take 650 mg by mouth 2 (two) times daily.    [provider]  aspirin EC 81 MG tablet Take 81 mg by mouth daily.    [provider]  carvedilol (COREG) 6.25 MG tablet TAKE 1 TABLET BY MOUTH TWICE DAILY 01/07/19   Marinus Maw, MD  furosemide (LASIX) 20 MG tablet Take 1 tablet (20 mg total) by mouth daily. 09/25/17 12/24/17  Marinus Maw, MD    Allergies    Penicillins  Review of Systems   Review of Systems  All other systems reviewed and are negative.   Physical Exam Updated Vital Signs There were no vitals taken for this visit.  Physical Exam Vitals and nursing note reviewed.  Constitutional:      General: He is not in acute distress.    Appearance: He is well-developed.  HENT:     Head: Normocephalic and atraumatic.     Comments: No signs of injury to the scalp or the head or the cervical spine    Nose: No congestion or rhinorrhea.     Mouth/Throat:     Pharynx: No  oropharyngeal exudate.  Eyes:     General: No scleral icterus.       Right eye: No discharge.        Left eye: No discharge.     Conjunctiva/sclera: Conjunctivae normal.     Pupils: Pupils are equal, round, and reactive to light.  Neck:     Thyroid: No thyromegaly.     Vascular: No JVD.  Cardiovascular:     Rate and Rhythm: Normal rate and regular rhythm.     Heart sounds: Murmur present. No friction rub. No gallop.      Comments: Slight systolic murmur Pulmonary:     Effort: Pulmonary effort is normal. No respiratory distress.     Breath sounds: Normal breath sounds. No wheezing or rales.     Comments: There is no tenderness over the ribs bilaterally Abdominal:     General: Bowel sounds are normal. There is no distension.     Palpations: Abdomen is soft. There is no mass.     Tenderness: There is no abdominal tenderness.  Musculoskeletal:        General: Tenderness and  deformity present. Normal range of motion.     Cervical back: Normal range of motion and neck supple.     Comments: There is no edema of the legs.  He has full range of motion of bilateral upper extremities and the right lower extremity however there is pain with range of motion of the left lower extremity to both flexion and rotation.  There is a leg length discrepancy with a shortened left leg with slight external rotation.  The patient does have mild lumbar spine tenderness, he is wearing a lumbar brace secondary to back pain and radiculopathy in the past  Lymphadenopathy:     Cervical: No cervical adenopathy.  Skin:    General: Skin is warm and dry.     Findings: No erythema or rash.  Neurological:     Mental Status: He is alert.     Coordination: Coordination normal.     Comments: Normal strength and sensation diffusely bilaterally, normal cranial nerves III through XII  Psychiatric:        Behavior: Behavior normal.     ED Results / Procedures / Treatments   Labs (all labs ordered are listed, but only abnormal results are displayed) Labs Reviewed  BASIC METABOLIC PANEL  CBC WITH DIFFERENTIAL/PLATELET  PROTIME-INR  TYPE AND SCREEN    EKG None  Radiology No results found.  Procedures Procedures (including critical care time)  Medications Ordered in ED Medications - No data to display  ED Course  I have reviewed the triage vital signs and the nursing notes.  Pertinent labs & imaging results that were available during my care of the patient were reviewed by me and considered in my medical decision making (see chart for details).    MDM Rules/Calculators/A&P                      The patient will need a preop work-up for possible hip fracture.  His clinical presentation is consistent with a left hip fracture.  He has never broken a hip.  Patient is agreeable to the plan, pain medication has been ordered, preop EKG and labs.  At change of shift, care signed out to Dr.  Blinda Leatherwood, expect hip fracture with admission likely  Final Clinical Impression(s) / ED Diagnoses Final diagnoses:  Closed fracture of left hip, initial encounter [facility])    Rx /  DC Orders ED Discharge Orders    None       Noemi Chapel, MD 04/21/19 [phone]

## 2019-04-20 NOTE — ED Triage Notes (Signed)
Pt arrived from home via [facility] c/o left hip pain. Pt reports he was at home when he fell backwards and landed on his back. Upon arrival EDP assessed left leg to present rotated with shortening. Pt reports pain 7/10 and unable to lift left leg at this time. Pt denies LOC or any other injury.

## 2019-04-21 ENCOUNTER — Encounter (HOSPITAL_COMMUNITY): Payer: Self-pay | Admitting: Internal Medicine

## 2019-04-21 DIAGNOSIS — E785 Hyperlipidemia, unspecified: Secondary | ICD-10-CM | POA: Diagnosis present

## 2019-04-21 DIAGNOSIS — Z20822 Contact with and (suspected) exposure to covid-19: Secondary | ICD-10-CM | POA: Diagnosis present

## 2019-04-21 DIAGNOSIS — Z7982 Long term (current) use of aspirin: Secondary | ICD-10-CM | POA: Diagnosis not present

## 2019-04-21 DIAGNOSIS — E871 Hypo-osmolality and hyponatremia: Secondary | ICD-10-CM | POA: Diagnosis present

## 2019-04-21 DIAGNOSIS — Z87891 Personal history of nicotine dependence: Secondary | ICD-10-CM | POA: Diagnosis not present

## 2019-04-21 DIAGNOSIS — Z8249 Family history of ischemic heart disease and other diseases of the circulatory system: Secondary | ICD-10-CM | POA: Diagnosis not present

## 2019-04-21 DIAGNOSIS — K219 Gastro-esophageal reflux disease without esophagitis: Secondary | ICD-10-CM | POA: Diagnosis present

## 2019-04-21 DIAGNOSIS — D638 Anemia in other chronic diseases classified elsewhere: Secondary | ICD-10-CM | POA: Diagnosis present

## 2019-04-21 DIAGNOSIS — S72002A Fracture of unspecified part of neck of left femur, initial encounter for closed fracture: Secondary | ICD-10-CM | POA: Diagnosis not present

## 2019-04-21 DIAGNOSIS — E860 Dehydration: Secondary | ICD-10-CM | POA: Diagnosis present

## 2019-04-21 DIAGNOSIS — D62 Acute posthemorrhagic anemia: Secondary | ICD-10-CM | POA: Diagnosis not present

## 2019-04-21 DIAGNOSIS — I10 Essential (primary) hypertension: Secondary | ICD-10-CM | POA: Diagnosis present

## 2019-04-21 DIAGNOSIS — Z79899 Other long term (current) drug therapy: Secondary | ICD-10-CM | POA: Diagnosis not present

## 2019-04-21 DIAGNOSIS — E8889 Other specified metabolic disorders: Secondary | ICD-10-CM | POA: Diagnosis present

## 2019-04-21 DIAGNOSIS — M80052A Age-related osteoporosis with current pathological fracture, left femur, initial encounter for fracture: Secondary | ICD-10-CM | POA: Diagnosis present

## 2019-04-21 DIAGNOSIS — E119 Type 2 diabetes mellitus without complications: Secondary | ICD-10-CM

## 2019-04-21 DIAGNOSIS — S72009A Fracture of unspecified part of neck of unspecified femur, initial encounter for closed fracture: Secondary | ICD-10-CM | POA: Diagnosis present

## 2019-04-21 DIAGNOSIS — W010XXA Fall on same level from slipping, tripping and stumbling without subsequent striking against object, initial encounter: Secondary | ICD-10-CM | POA: Diagnosis present

## 2019-04-21 DIAGNOSIS — D649 Anemia, unspecified: Secondary | ICD-10-CM | POA: Diagnosis not present

## 2019-04-21 DIAGNOSIS — I495 Sick sinus syndrome: Secondary | ICD-10-CM | POA: Diagnosis present

## 2019-04-21 DIAGNOSIS — I4891 Unspecified atrial fibrillation: Secondary | ICD-10-CM | POA: Diagnosis present

## 2019-04-21 DIAGNOSIS — Z880 Allergy status to penicillin: Secondary | ICD-10-CM | POA: Diagnosis not present

## 2019-04-21 DIAGNOSIS — E1129 Type 2 diabetes mellitus with other diabetic kidney complication: Secondary | ICD-10-CM | POA: Diagnosis not present

## 2019-04-21 DIAGNOSIS — Z7984 Long term (current) use of oral hypoglycemic drugs: Secondary | ICD-10-CM | POA: Diagnosis not present

## 2019-04-21 DIAGNOSIS — N400 Enlarged prostate without lower urinary tract symptoms: Secondary | ICD-10-CM | POA: Diagnosis present

## 2019-04-21 DIAGNOSIS — Z950 Presence of cardiac pacemaker: Secondary | ICD-10-CM | POA: Diagnosis not present

## 2019-04-21 LAB — BASIC METABOLIC PANEL
Anion gap: 8 (ref 5–15)
BUN: 38 mg/dL — ABNORMAL HIGH (ref 8–23)
CO2: 24 mmol/L (ref 22–32)
Calcium: 8.7 mg/dL — ABNORMAL LOW (ref 8.9–10.3)
Chloride: 91 mmol/L — ABNORMAL LOW (ref 98–111)
Creatinine, Ser: 1.07 mg/dL (ref 0.61–1.24)
GFR calc Af Amer: >60 mL/min (ref >60)
GFR calc non Af Amer: 60 mL/min — ABNORMAL LOW (ref >60)
Glucose, Bld: 157 mg/dL — ABNORMAL HIGH (ref 70–99)
Potassium: 4.2 mmol/L (ref 3.5–5.1)
Sodium: 123 mmol/L — ABNORMAL LOW (ref 135–145)

## 2019-04-21 LAB — COMPREHENSIVE METABOLIC PANEL
ALT: 13 U/L (ref 0–44)
AST: 16 U/L (ref 15–41)
Albumin: 3.6 g/dL (ref 3.5–5.0)
Alkaline Phosphatase: 100 U/L (ref 38–126)
Anion gap: 9 (ref 5–15)
BUN: 39 mg/dL — ABNORMAL HIGH (ref 8–23)
CO2: 26 mmol/L (ref 22–32)
Calcium: 8.8 mg/dL — ABNORMAL LOW (ref 8.9–10.3)
Chloride: 90 mmol/L — ABNORMAL LOW (ref 98–111)
Creatinine, Ser: 1.02 mg/dL (ref 0.61–1.24)
GFR calc Af Amer: >60 mL/min (ref >60)
GFR calc non Af Amer: >60 mL/min (ref >60)
Glucose, Bld: 173 mg/dL — ABNORMAL HIGH (ref 70–99)
Potassium: 4.2 mmol/L (ref 3.5–5.1)
Sodium: 125 mmol/L — ABNORMAL LOW (ref 135–145)
Total Bilirubin: 0.9 mg/dL (ref 0.3–1.2)
Total Protein: 7.1 g/dL (ref 6.5–8.1)

## 2019-04-21 LAB — CBC
HCT: 34.9 % — ABNORMAL LOW (ref 39.0–52.0)
Hemoglobin: 11.5 g/dL — ABNORMAL LOW (ref 13.0–17.0)
MCH: 28.8 pg (ref 26.0–34.0)
MCHC: 33 g/dL (ref 30.0–36.0)
MCV: 87.3 fL (ref 80.0–100.0)
Platelets: 296 10*3/uL (ref 150–400)
RBC: 4 MIL/uL — ABNORMAL LOW (ref 4.22–5.81)
RDW: 14.6 % (ref 11.5–15.5)
WBC: 18.4 10*3/uL — ABNORMAL HIGH (ref 4.0–10.5)
nRBC: 0 % (ref 0.0–0.2)

## 2019-04-21 LAB — OSMOLALITY: Osmolality: 277 mOsm/kg (ref 275–295)

## 2019-04-21 LAB — HEMOGLOBIN A1C
Hgb A1c MFr Bld: 6.4 % — ABNORMAL HIGH (ref 4.8–5.6)
Mean Plasma Glucose: 136.98 mg/dL

## 2019-04-21 LAB — LACTIC ACID, PLASMA: Lactic Acid, Venous: 1.1 mmol/L (ref 0.5–1.9)

## 2019-04-21 LAB — RESPIRATORY PANEL BY RT PCR (FLU A&B, COVID)
Influenza A by PCR: NEGATIVE
Influenza B by PCR: NEGATIVE
SARS Coronavirus 2 by RT PCR: NEGATIVE

## 2019-04-21 LAB — TYPE AND SCREEN
ABO/RH(D): A POS
Antibody Screen: NEGATIVE

## 2019-04-21 LAB — TSH: TSH: 2.214 u[IU]/mL (ref 0.350–4.500)

## 2019-04-21 LAB — SODIUM, URINE, RANDOM: Sodium, Ur: 63 mmol/L

## 2019-04-21 LAB — OSMOLALITY, URINE: Osmolality, Ur: 130 mOsm/kg — ABNORMAL LOW (ref 300–900)

## 2019-04-21 LAB — CORTISOL: Cortisol, Plasma: 28.7 ug/dL

## 2019-04-21 LAB — PROCALCITONIN: Procalcitonin: <0.1 ng/mL

## 2019-04-21 MED ORDER — HYDROMORPHONE HCL 1 MG/ML IJ SOLN
0.5000 mg | INTRAMUSCULAR | Status: DC | PRN
  Administered 2019-04-21 (×2): 0.5 mg via INTRAVENOUS
  Filled 2019-04-21 (×2): qty 1

## 2019-04-21 MED ORDER — ACETAMINOPHEN 325 MG PO TABS: 650.0000 mg | ORAL_TABLET | Freq: Four times a day (QID) | ORAL | Status: DC | PRN

## 2019-04-21 MED ORDER — ENOXAPARIN SODIUM 40 MG/0.4ML ~~LOC~~ SOLN
40.0000 mg | SUBCUTANEOUS | Status: DC
  Administered 2019-04-21: 40 mg via SUBCUTANEOUS
  Filled 2019-04-21: qty 0.4

## 2019-04-21 MED ORDER — ACETAMINOPHEN 650 MG RE SUPP: 650.0000 mg | Freq: Four times a day (QID) | RECTAL | Status: DC | PRN

## 2019-04-21 MED ORDER — ONDANSETRON HCL 4 MG/2ML IJ SOLN: 4.0000 mg | Freq: Four times a day (QID) | INTRAMUSCULAR | Status: DC | PRN

## 2019-04-21 MED ORDER — ASPIRIN EC 81 MG PO TBEC
81.0000 mg | DELAYED_RELEASE_TABLET | Freq: Every day | ORAL | Status: DC
  Administered 2019-04-21 – 2019-04-27 (×6): 81 mg via ORAL
  Filled 2019-04-21 (×5): qty 1

## 2019-04-21 MED ORDER — SODIUM CHLORIDE 0.9 % IV SOLN: INTRAVENOUS | Status: AC

## 2019-04-21 MED ORDER — CARVEDILOL 6.25 MG PO TABS
6.2500 mg | ORAL_TABLET | Freq: Two times a day (BID) | ORAL | Status: DC
  Administered 2019-04-21 – 2019-04-27 (×12): 6.25 mg via ORAL
  Filled 2019-04-21 (×2): qty 1
  Filled 2019-04-21: qty 2
  Filled 2019-04-21 (×9): qty 1

## 2019-04-21 MED ORDER — MORPHINE SULFATE (PF) 2 MG/ML IV SOLN
INTRAVENOUS | Status: AC
  Administered 2019-04-21: 1 mg via INTRAVENOUS
  Filled 2019-04-21: qty 1

## 2019-04-21 MED ORDER — MORPHINE SULFATE (PF) 2 MG/ML IV SOLN: 1.0000 mg | Freq: Once | INTRAVENOUS | Status: AC

## 2019-04-21 NOTE — Progress Notes (Signed)
Per HPI:  Nathan Alvarez  is a 86 y.o. male,  w gerd, hypertension, yperlipidemia, dm2, sick sinus syndrome s/p pacer, presents with fall. Pt was reaching for cookie when he fell backwards and landed on hip.   Patient was noted to have left femoral neck fracture with varus angulation on hip x-ray along with hyponatremia with sodium of 123.  He was noted to be on Lasix at home.  Patient will need to transfer to [facility] for operative intervention per orthopedics.  His sodium has improved to 125 this morning and his TSH is noted to be 2.214.  Plan to continue normal saline and recheck a.m. labs and avoid Lasix for now.  Urine and serum osmolarity is pending.  Patient otherwise seen and evaluated at bedside with no acute complaints or concerns noted.  Total care time: 20 minutes.

## 2019-04-21 NOTE — ED Provider Notes (Signed)
Patient signed out to me by Dr. Hyacinth Meeker to follow-up on imaging.  Patient had a mechanical, ground-level fall earlier tonight injuring his left hip.  X-ray does confirm left femoral neck fracture.  Discussed with Dr. Jena Gauss, on-call for Ortho at [facility].  He agrees to see the patient in consultation for surgical intervention.  Possible surgery today, keep n.p.o.  Will admit to hospitalist service.   Gilda Crease, MD 04/21/19 [place] Manners

## 2019-04-21 NOTE — H&P (Signed)
TRH H&P    Patient Demographics:    Nathan Alvarez, is a 86 y.o. male  MRN: [id number]  DOB - [birth year]  Admit Date - 04/20/2019  Referring MD/NP/PA:  Tommas Olp  Outpatient Primary MD for the patient is Corrington, Kip A, MD  Patient coming from:  home  Chief complaint- fall   HPI:    Nathan Alvarez  is a 86 y.o. male,  w gerd, hypertension, yperlipidemia, dm2, sick sinus syndrome s/p pacer, presents with fall. Pt was reaching for cookie when he fell backwards and landed on hip.   In ED,   Xray L hip IMPRESSION: Left femoral neck fracture with varus angulation.  CXR IMPRESSION: No active disease.  Na 123, K 4.2, Bun 38, Creatinine 1.07 Wbc 11.6, Hgb 11.5, Plt 314  INR 1.1  Dr. Blinda Leatherwood spoke with orthopedics, [place] who requested medicine admission to [facility]  Pt will be admitted for Left femoral neck fracture and hyponatremia.      Review of systems:    In addition to the HPI above,  No Fever-chills, No Headache, No changes with Vision or hearing, No problems swallowing food or Liquids, No Chest pain, Cough or Shortness of Breath, No Abdominal pain, No Nausea or Vomiting, bowel movements are regular, No Blood in stool or Urine, No dysuria, No new skin rashes or bruises,   No new weakness, tingling, numbness in any extremity, No recent weight gain or loss, No polyuria, polydypsia or polyphagia, No significant Mental Stressors.  All other systems reviewed and are negative.    Past History of the following :    Past Medical History:  Diagnosis Date  . A-fib (HCC)   . Dizziness   . DM (diabetes mellitus) (HCC)   . Esophagitis, reflux   . Heart block   . Hyperlipidemia   . Hypertension, essential, benign   . Memory deficit   . SOB (shortness of breath)   . SSS (sick sinus syndrome) [facility])       Past Surgical History:  Procedure Laterality Date  . CATARACT  EXTRACTION Right   . INGUINAL HERNIA REPAIR    . INSERT / REPLACE / REMOVE PACEMAKER    . PACEMAKER INSERTION    . TONSILLECTOMY AND ADENOIDECTOMY        Social History:      Social History   Tobacco Use  . Smoking status: Former Smoker    Quit date: 01/01/1956    Years since quitting: 63.3  . Smokeless tobacco: Never Used  Substance Use Topics  . Alcohol use: No       Family History :     Family History  Problem Relation Age of Onset  . Heart disease Mother        Home Medications:   Prior to Admission medications   Medication Sig Start Date End Date Taking? Authorizing Provider  acetaminophen (TYLENOL) 650 MG CR tablet Take 650 mg by mouth 2 (two) times daily.    [provider]  aspirin EC 81 MG tablet Take 81 mg by  mouth daily.    [provider]  carvedilol (COREG) 6.25 MG tablet TAKE 1 TABLET BY MOUTH TWICE DAILY 01/07/19   Marinus Maw, MD  furosemide (LASIX) 20 MG tablet Take 1 tablet (20 mg total) by mouth daily. 09/25/17 12/24/17  Marinus Maw, MD     Allergies:     Allergies  Allergen Reactions  . Penicillins     unknown     Physical Exam:   Vitals  Blood pressure (!) 167/71, pulse 71, temperature 98 F (36.7 C), temperature source Oral, resp. rate (!) 25, height 5\' 6"  (1.676 m), weight 61.7 kg, SpO2 95 %.  1.  General: axox3  2. Psychiatric: euthymic  3. Neurologic: nonfocal  4. HEENMT:  Anicteric, pupils 1.30mm symmetric, direct, consensual intact Neck: no jvd  5. Respiratory : CTAB  6. Cardiovascular : rrr s1, s2, no m/g/r Pacer  7. Gastrointestinal:  ABd: soft, nt, nd, +bs  8. Skin:  Ext: no c/c/e, no rash  9.Musculoskeletal:  Unable to move left leg w/o pain in hip    Data Review:    CBC Recent Labs  Lab 04/20/19 2303  WBC 11.6*  HGB 11.5*  HCT 33.7*  PLT 314  MCV 86.6  MCH 29.6  MCHC 34.1  RDW 14.6  LYMPHSABS 1.1  MONOABS 0.6  EOSABS 0.2  BASOSABS 0.0    ------------------------------------------------------------------------------------------------------------------  Results for orders placed or performed during the hospital encounter of 04/20/19 (from the past 48 hour(s))  Basic metabolic panel     Status: Abnormal   Collection Time: 04/20/19 11:03 PM  Result Value Ref Range   Sodium 123 (L) 135 - 145 mmol/L   Potassium 4.2 3.5 - 5.1 mmol/L   Chloride 91 (L) 98 - 111 mmol/L   CO2 24 22 - 32 mmol/L   Glucose, Bld 157 (H) 70 - 99 mg/dL   BUN 38 (H) 8 - 23 mg/dL   Creatinine, Ser 04/22/19 0.61 - 1.24 mg/dL   Calcium 8.7 (L) 8.9 - 10.3 mg/dL   GFR calc non Af Amer 60 (L) >60 mL/min   GFR calc Af Amer >60 >60 mL/min   Anion gap 8 5 - 15    Comment: Performed at [facility], [address] [place], [place]  CBC WITH DIFFERENTIAL     Status: Abnormal   Collection Time: 04/20/19 11:03 PM  Result Value Ref Range   WBC 11.6 (H) 4.0 - 10.5 K/uL   RBC 3.89 (L) 4.22 - 5.81 MIL/uL   Hemoglobin 11.5 (L) 13.0 - 17.0 g/dL   HCT 04/22/19 (L) 23.5 - 57.3 %   MCV 86.6 80.0 - 100.0 fL   MCH 29.6 26.0 - 34.0 pg   MCHC 34.1 30.0 - 36.0 g/dL   RDW 22.0 25.4 - 27.0 %   Platelets 314 150 - 400 K/uL   nRBC 0.0 0.0 - 0.2 %   Neutrophils Relative % 81 %   Neutro Abs 9.4 (H) 1.7 - 7.7 K/uL   Lymphocytes Relative 10 %   Lymphs Abs 1.1 0.7 - 4.0 K/uL   Monocytes Relative 6 %   Monocytes Absolute 0.6 0.1 - 1.0 K/uL   Eosinophils Relative 2 %   Eosinophils Absolute 0.2 0.0 - 0.5 K/uL   Basophils Relative 0 %   Basophils Absolute 0.0 0.0 - 0.1 K/uL   Immature Granulocytes 1 %   Abs Immature Granulocytes 0.11 (H) 0.00 - 0.07 K/uL    Comment: Performed at [facility],  [place], [place] [address]  Protime-INR     Status: None   Collection Time: 04/20/19 11:03 PM  Result Value Ref Range   Prothrombin Time 13.8 11.4 - 15.2 seconds   INR 1.1 0.8 - 1.2    Comment: (NOTE) INR goal varies based on device and disease states. Performed  at [facility], [address] [place], [place] [address]   Type and screen [facility]     Status: None   Collection Time: 04/20/19 11:03 PM  Result Value Ref Range   ABO/RH(D) A POS    Antibody Screen NEG    Sample Expiration      04/23/2019,2359 Performed at [facility], [address] [place], [place] [address]   Respiratory Panel by RT PCR (Flu A&B, Covid) - Nasopharyngeal Swab     Status: None   Collection Time: 04/20/19 11:54 PM   Specimen: Nasopharyngeal Swab  Result Value Ref Range   SARS Coronavirus 2 by RT PCR NEGATIVE NEGATIVE    Comment: (NOTE) SARS-CoV-2 target nucleic acids are NOT DETECTED. The SARS-CoV-2 RNA is generally detectable in upper respiratoy specimens during the acute phase of infection. The lowest concentration of SARS-CoV-2 viral copies this assay can detect is 131 copies/mL. A negative result does not preclude SARS-Cov-2 infection and should not be used as the sole basis for treatment or other patient management decisions. A negative result may occur with  improper specimen collection/handling, submission of specimen other than nasopharyngeal swab, presence of viral mutation(s) within the areas targeted by this assay, and inadequate number of viral copies (<131 copies/mL). A negative result must be combined with clinical observations, patient history, and epidemiological information. The expected result is Negative. Fact Sheet for Patients:  PinkCheek.be Fact Sheet for Healthcare Providers:  GravelBags.it This test is not yet ap proved or cleared by the [place] FDA and  has been authorized for detection and/or diagnosis of SARS-CoV-2 by FDA under an Emergency Use Authorization (EUA). This EUA will remain  in effect (meaning this test can be used) for the duration of the COVID-19 declaration under Section 564(b)(1) of the Act, 21 U.S.C. section 360bbb-3(b)(1), unless the  authorization is terminated or revoked sooner.    Influenza A by PCR NEGATIVE NEGATIVE   Influenza B by PCR NEGATIVE NEGATIVE    Comment: (NOTE) The Xpert Xpress SARS-CoV-2/FLU/RSV assay is intended as an aid in  the diagnosis of influenza from Nasopharyngeal swab specimens and  should not be used as a sole basis for treatment. Nasal washings and  aspirates are unacceptable for Xpert Xpress SARS-CoV-2/FLU/RSV  testing. Fact Sheet for Patients: PinkCheek.be Fact Sheet for Healthcare Providers: GravelBags.it This test is not yet approved or cleared by the [place] FDA and  has been authorized for detection and/or diagnosis of SARS-CoV-2 by  FDA under an Emergency Use Authorization (EUA). This EUA will remain  in effect (meaning this test can be used) for the duration of the  Covid-19 declaration under Section 564(b)(1) of the Act, 21  U.S.C. section 360bbb-3(b)(1), unless the authorization is  terminated or revoked. Performed at [facility], [address], [place], [place] [address]     Chemistries  Recent Labs  Lab 04/20/19 2303  NA 123*  K 4.2  CL 91*  CO2 24  GLUCOSE 157*  BUN 38*  CREATININE 1.07  CALCIUM 8.7*   ------------------------------------------------------------------------------------------------------------------  ------------------------------------------------------------------------------------------------------------------ GFR: Estimated Creatinine Clearance: 38.4 mL/min (by C-G formula based on SCr of 1.07 mg/dL). Liver Function Tests:  No results for input(s): AST, ALT, ALKPHOS, BILITOT, PROT, ALBUMIN in the last 168 hours. No results for input(s): LIPASE, AMYLASE in the last 168 hours. No results for input(s): AMMONIA in the last 168 hours. Coagulation Profile: Recent Labs  Lab 04/20/19 2303  INR 1.1   Cardiac Enzymes: No results for input(s): CKTOTAL, CKMB, CKMBINDEX, TROPONINI  in the last 168 hours. BNP (last 3 results) No results for input(s): PROBNP in the last 8760 hours. HbA1C: No results for input(s): HGBA1C in the last 72 hours. CBG: No results for input(s): GLUCAP in the last 168 hours. Lipid Profile: No results for input(s): CHOL, HDL, LDLCALC, TRIG, CHOLHDL, LDLDIRECT in the last 72 hours. Thyroid Function Tests: No results for input(s): TSH, T4TOTAL, FREET4, T3FREE, THYROIDAB in the last 72 hours. Anemia Panel: No results for input(s): VITAMINB12, FOLATE, FERRITIN, TIBC, IRON, RETICCTPCT in the last 72 hours.  --------------------------------------------------------------------------------------------------------------- Urine analysis: No results found for: COLORURINE, APPEARANCEUR, LABSPEC, PHURINE, GLUCOSEU, HGBUR, BILIRUBINUR, KETONESUR, PROTEINUR, UROBILINOGEN, NITRITE, LEUKOCYTESUR    Imaging Results:    DG Chest Port 1 View  Result Date: 04/20/2019 CLINICAL DATA:  Fall, left hip pain EXAM: PORTABLE CHEST 1 VIEW COMPARISON:  None. FINDINGS: Left pacer in place with leads in the right atrium and right ventricle. Heart is normal size. No confluent airspace opacities. No effusions. No acute bony abnormality. IMPRESSION: No active disease. Electronically Signed   By: Charlett Nose M.D.   On: 04/20/2019 23:45   DG Hip Unilat With Pelvis 2-3 Views Left  Result Date: 04/20/2019 CLINICAL DATA:  Fall, deformity EXAM: DG HIP (WITH OR WITHOUT PELVIS) 2-3V LEFT COMPARISON:  None. FINDINGS: There is a left femoral neck fracture with varus angulation. No subluxation or dislocation. Mild symmetric degenerative changes in the hips bilaterally. Diffuse osteopenia. IMPRESSION: Left femoral neck fracture with varus angulation. Electronically Signed   By: Charlett Nose M.D.   On: 04/20/2019 23:46    Ekg nsr at 65, nl axis, nl int, early R progression, no st-t changes c/w ischemia   Assessment & Plan:    Principal Problem:   Femoral neck fracture (HCC) Active  Problems:   Hypertension   Diabetes (HCC)   Left femoral neck fracture NPO Dilaudid 0.5mg  iv q4h prn  Zofran 4mg  iv q6h prn  Orthopedics consulted by ED, appreciate input  Hyponatremia Check serum osm, cortisol, tsh Check urine osm, urine sodium Hydrate with ns at 62mL per hour  Check cmp in am  Hypertension Cont Carvedilol 6.25mg  po bid Hold Lasix due to hyponatremia  ? Dm2 Check hga1c, if elevated please start on SSI  Anemia Check cbc in am  DVT Prophylaxis-   Lovenox - SCDs   AM Labs Ordered, also please review Full Orders  Family Communication: Admission, patients condition and plan of care including tests being ordered have been discussed with the patient  who indicate understanding and agree with the plan and Code Status.  Code Status:  FULL CODE per patient, attempted to contact daughter, left message that patient will be admitted to [facility]   Admission status: Inpatient: Based on patients cliinical presentation and evaluation of above clinical data, I have made determination that patient meets Inpatient criteria at this time.  Pt has left hip fracture and will require surgery,  Pt will require > 2 nites stay. For this as well as hyponatremia.   Time spent in minutes : 55 minutes   [facility] M.D on 04/21/2019 at 2:56 AM

## 2019-04-21 NOTE — ED Notes (Signed)
Notified Short stay that pt would be delayed due to transport.

## 2019-04-21 NOTE — Plan of Care (Signed)

## 2019-04-21 NOTE — Progress Notes (Signed)
Patient still has not arrived. Seems that patient already been seen and evaluated by Dr. Clelia Croft of any pain. Patient record was reviewed in detail. Awaiting patient's arrival.

## 2019-04-21 NOTE — ED Notes (Signed)
Dr. Shah at bedside.

## 2019-04-22 ENCOUNTER — Inpatient Hospital Stay (HOSPITAL_COMMUNITY): Payer: Medicare Other

## 2019-04-22 ENCOUNTER — Inpatient Hospital Stay (HOSPITAL_COMMUNITY): Payer: Medicare Other | Admitting: Certified Registered"

## 2019-04-22 ENCOUNTER — Encounter (HOSPITAL_COMMUNITY)
Admission: EM | Disposition: A | Discharge status: Skilled Nursing Facility | Payer: Self-pay | Source: Home / Self Care | Attending: Family Medicine

## 2019-04-22 HISTORY — PX: HIP ARTHROPLASTY: SHX981

## 2019-04-22 LAB — BASIC METABOLIC PANEL
Anion gap: 10 (ref 5–15)
BUN: 33 mg/dL — ABNORMAL HIGH (ref 8–23)
CO2: 22 mmol/L (ref 22–32)
Calcium: 8.4 mg/dL — ABNORMAL LOW (ref 8.9–10.3)
Chloride: 98 mmol/L (ref 98–111)
Creatinine, Ser: 1.07 mg/dL (ref 0.61–1.24)
GFR calc Af Amer: >60 mL/min (ref >60)
GFR calc non Af Amer: 60 mL/min — ABNORMAL LOW (ref >60)
Glucose, Bld: 126 mg/dL — ABNORMAL HIGH (ref 70–99)
Potassium: 3.8 mmol/L (ref 3.5–5.1)
Sodium: 130 mmol/L — ABNORMAL LOW (ref 135–145)

## 2019-04-22 LAB — CBC
HCT: 31.5 % — ABNORMAL LOW (ref 39.0–52.0)
Hemoglobin: 10.8 g/dL — ABNORMAL LOW (ref 13.0–17.0)
MCH: 29.3 pg (ref 26.0–34.0)
MCHC: 34.3 g/dL (ref 30.0–36.0)
MCV: 85.6 fL (ref 80.0–100.0)
Platelets: 280 10*3/uL (ref 150–400)
RBC: 3.68 MIL/uL — ABNORMAL LOW (ref 4.22–5.81)
RDW: 14.7 % (ref 11.5–15.5)
WBC: 10.8 10*3/uL — ABNORMAL HIGH (ref 4.0–10.5)
nRBC: 0 % (ref 0.0–0.2)

## 2019-04-22 LAB — GLUCOSE, CAPILLARY: Glucose-Capillary: 96 mg/dL (ref 70–99)

## 2019-04-22 SURGERY — HEMIARTHROPLASTY, HIP, DIRECT ANTERIOR APPROACH, FOR FRACTURE
Anesthesia: Spinal | Site: Hip | Laterality: Left

## 2019-04-22 MED ORDER — CHLORHEXIDINE GLUCONATE CLOTH 2 % EX PADS
6.0000 | MEDICATED_PAD | Freq: Every day | CUTANEOUS | Status: DC
  Administered 2019-04-22 – 2019-04-27 (×6): 6 via TOPICAL

## 2019-04-22 MED ORDER — PHENYLEPHRINE HCL-NACL 10-0.9 MG/250ML-% IV SOLN
INTRAVENOUS | Status: DC | PRN
  Administered 2019-04-22: 25 ug/min via INTRAVENOUS

## 2019-04-22 MED ORDER — PHENYLEPHRINE 40 MCG/ML (10ML) SYRINGE FOR IV PUSH (FOR BLOOD PRESSURE SUPPORT)
PREFILLED_SYRINGE | INTRAVENOUS | Status: DC | PRN
  Administered 2019-04-22: 120 ug via INTRAVENOUS
  Administered 2019-04-22: 200 ug via INTRAVENOUS

## 2019-04-22 MED ORDER — ASCORBIC ACID 500 MG PO TABS
500.0000 mg | ORAL_TABLET | Freq: Every day | ORAL | Status: DC
  Administered 2019-04-22 – 2019-04-27 (×6): 500 mg via ORAL
  Filled 2019-04-22 (×6): qty 1

## 2019-04-22 MED ORDER — STERILE WATER FOR IRRIGATION IR SOLN
Status: DC | PRN
  Administered 2019-04-22: 1000 mL

## 2019-04-22 MED ORDER — ONDANSETRON HCL 4 MG/2ML IJ SOLN: 4.0000 mg | Freq: Once | INTRAMUSCULAR | Status: DC | PRN

## 2019-04-22 MED ORDER — VITAMIN D 25 MCG (1000 UNIT) PO TABS
2000.0000 [IU] | ORAL_TABLET | Freq: Two times a day (BID) | ORAL | Status: DC
  Administered 2019-04-22 – 2019-04-27 (×11): 2000 [IU] via ORAL
  Filled 2019-04-22 (×11): qty 2

## 2019-04-22 MED ORDER — ENOXAPARIN SODIUM 40 MG/0.4ML ~~LOC~~ SOLN
40.0000 mg | SUBCUTANEOUS | Status: DC
  Administered 2019-04-23 – 2019-04-27 (×3): 40 mg via SUBCUTANEOUS
  Filled 2019-04-22 (×4): qty 0.4

## 2019-04-22 MED ORDER — METOCLOPRAMIDE HCL 5 MG PO TABS: 5.0000 mg | ORAL_TABLET | Freq: Three times a day (TID) | ORAL | Status: DC | PRN

## 2019-04-22 MED ORDER — FENTANYL CITRATE (PF) 250 MCG/5ML IJ SOLN
INTRAMUSCULAR | Status: AC
  Filled 2019-04-22: qty 5

## 2019-04-22 MED ORDER — BUPIVACAINE IN DEXTROSE 0.75-8.25 % IT SOLN
INTRATHECAL | Status: DC | PRN
  Administered 2019-04-22: 1.6 mL via INTRATHECAL

## 2019-04-22 MED ORDER — FENTANYL CITRATE (PF) 100 MCG/2ML IJ SOLN
INTRAMUSCULAR | Status: DC | PRN
  Administered 2019-04-22: 50 ug via INTRAVENOUS

## 2019-04-22 MED ORDER — HYDROCODONE-ACETAMINOPHEN 5-325 MG PO TABS: 1.0000 | ORAL_TABLET | ORAL | Status: DC | PRN

## 2019-04-22 MED ORDER — METOCLOPRAMIDE HCL 5 MG/ML IJ SOLN: 5.0000 mg | Freq: Three times a day (TID) | INTRAMUSCULAR | Status: DC | PRN

## 2019-04-22 MED ORDER — PROPOFOL 500 MG/50ML IV EMUL
INTRAVENOUS | Status: DC | PRN
  Administered 2019-04-22: 50 ug/kg/min via INTRAVENOUS

## 2019-04-22 MED ORDER — LACTATED RINGERS IV SOLN: INTRAVENOUS | Status: DC

## 2019-04-22 MED ORDER — MORPHINE SULFATE (PF) 2 MG/ML IV SOLN: 0.5000 mg | INTRAVENOUS | Status: DC | PRN

## 2019-04-22 MED ORDER — 0.9 % SODIUM CHLORIDE (POUR BTL) OPTIME
TOPICAL | Status: DC | PRN
  Administered 2019-04-22: 1000 mL

## 2019-04-22 MED ORDER — FENTANYL CITRATE (PF) 100 MCG/2ML IJ SOLN: 25.0000 ug | INTRAMUSCULAR | Status: DC | PRN

## 2019-04-22 MED ORDER — ONDANSETRON HCL 4 MG/2ML IJ SOLN: 4.0000 mg | Freq: Four times a day (QID) | INTRAMUSCULAR | Status: DC | PRN

## 2019-04-22 MED ORDER — CEFAZOLIN SODIUM-DEXTROSE 2-4 GM/100ML-% IV SOLN
2.0000 g | Freq: Four times a day (QID) | INTRAVENOUS | Status: AC
  Administered 2019-04-22 (×2): 2 g via INTRAVENOUS
  Filled 2019-04-22 (×2): qty 100

## 2019-04-22 MED ORDER — ACETAMINOPHEN 325 MG PO TABS: 325.0000 mg | ORAL_TABLET | Freq: Four times a day (QID) | ORAL | Status: DC | PRN

## 2019-04-22 MED ORDER — CEFAZOLIN SODIUM-DEXTROSE 2-3 GM-%(50ML) IV SOLR
INTRAVENOUS | Status: DC | PRN
  Administered 2019-04-22: 2 g via INTRAVENOUS

## 2019-04-22 MED ORDER — ONDANSETRON HCL 4 MG/2ML IJ SOLN
INTRAMUSCULAR | Status: DC | PRN
  Administered 2019-04-22: 4 mg via INTRAVENOUS

## 2019-04-22 MED ORDER — PROPOFOL 10 MG/ML IV BOLUS
INTRAVENOUS | Status: DC | PRN
  Administered 2019-04-22: 10 mg via INTRAVENOUS
  Administered 2019-04-22: 20 mg via INTRAVENOUS

## 2019-04-22 MED ORDER — ONDANSETRON HCL 4 MG/2ML IJ SOLN
INTRAMUSCULAR | Status: AC
  Filled 2019-04-22: qty 2

## 2019-04-22 MED ORDER — SODIUM CHLORIDE 0.9 % IR SOLN
Status: DC | PRN
  Administered 2019-04-22: 1000 mL

## 2019-04-22 MED ORDER — DOCUSATE SODIUM 100 MG PO CAPS
100.0000 mg | ORAL_CAPSULE | Freq: Two times a day (BID) | ORAL | Status: DC
  Administered 2019-04-22 – 2019-04-23 (×2): 100 mg via ORAL
  Filled 2019-04-22 (×2): qty 1

## 2019-04-22 MED ORDER — HYDROCODONE-ACETAMINOPHEN 7.5-325 MG PO TABS: 1.0000 | ORAL_TABLET | ORAL | Status: DC | PRN

## 2019-04-22 MED ORDER — PHENOL 1.4 % MT LIQD: 1.0000 | OROMUCOSAL | Status: DC | PRN

## 2019-04-22 MED ORDER — ONDANSETRON HCL 4 MG PO TABS: 4.0000 mg | ORAL_TABLET | Freq: Four times a day (QID) | ORAL | Status: DC | PRN

## 2019-04-22 MED ORDER — ACETAMINOPHEN 500 MG PO TABS
500.0000 mg | ORAL_TABLET | Freq: Three times a day (TID) | ORAL | Status: DC
  Administered 2019-04-22 – 2019-04-27 (×15): 500 mg via ORAL
  Filled 2019-04-22 (×16): qty 1

## 2019-04-22 MED ORDER — MENTHOL 3 MG MT LOZG: 1.0000 | LOZENGE | OROMUCOSAL | Status: DC | PRN

## 2019-04-22 SURGICAL SUPPLY — 58 items
BLADE SAW SGTL 73X25 THK (BLADE) ×2 IMPLANT
BRUSH FEMORAL CANAL (MISCELLANEOUS) IMPLANT
BRUSH SCRUB EZ PLAIN DRY (MISCELLANEOUS) ×4 IMPLANT
COVER SURGICAL LIGHT HANDLE (MISCELLANEOUS) ×2 IMPLANT
COVER WAND RF STERILE (DRAPES) ×2 IMPLANT
DRAPE INCISE IOBAN 85X60 (DRAPES) ×3 IMPLANT
DRAPE ORTHO SPLIT 77X108 STRL (DRAPES) ×2
DRAPE SURG ORHT 6 SPLT 77X108 (DRAPES) ×2 IMPLANT
DRAPE U-SHAPE 47X51 STRL (DRAPES) ×2 IMPLANT
DRSG MEPILEX BORDER 4X4 (GAUZE/BANDAGES/DRESSINGS) ×1 IMPLANT
DRSG MEPILEX BORDER 4X8 (GAUZE/BANDAGES/DRESSINGS) ×2 IMPLANT
ELECT BLADE 6.5 EXT (BLADE) IMPLANT
ELECT CAUTERY BLADE 6.4 (BLADE) ×1 IMPLANT
ELECT REM PT RETURN 9FT ADLT (ELECTROSURGICAL) ×2
ELECTRODE REM PT RTRN 9FT ADLT (ELECTROSURGICAL) ×1 IMPLANT
GLOVE BIO SURGEON STRL SZ7.5 (GLOVE) ×3 IMPLANT
GLOVE BIO SURGEON STRL SZ8 (GLOVE) ×2 IMPLANT
GLOVE BIOGEL PI IND STRL 7.0 (GLOVE) IMPLANT
GLOVE BIOGEL PI IND STRL 8 (GLOVE) ×2 IMPLANT
GLOVE BIOGEL PI INDICATOR 7.0 (GLOVE) ×1
GLOVE BIOGEL PI INDICATOR 8 (GLOVE) ×3
GOWN STRL REUS W/ TWL LRG LVL3 (GOWN DISPOSABLE) ×2 IMPLANT
GOWN STRL REUS W/ TWL XL LVL3 (GOWN DISPOSABLE) ×1 IMPLANT
GOWN STRL REUS W/TWL 2XL LVL3 (GOWN DISPOSABLE) IMPLANT
GOWN STRL REUS W/TWL LRG LVL3 (GOWN DISPOSABLE) ×2
GOWN STRL REUS W/TWL XL LVL3 (GOWN DISPOSABLE) ×1
HANDPIECE INTERPULSE COAX TIP (DISPOSABLE)
HEAD FEM UNIPOLAR 49 OD STRL (Hips) ×1 IMPLANT
KIT BASIN OR (CUSTOM PROCEDURE TRAY) ×2 IMPLANT
KIT TURNOVER KIT B (KITS) ×2 IMPLANT
MANIFOLD NEPTUNE II (INSTRUMENTS) ×2 IMPLANT
NDL 1/2 CIR MAYO (NEEDLE) IMPLANT
NEEDLE 1/2 CIR MAYO (NEEDLE) IMPLANT
NS IRRIG 1000ML POUR BTL (IV SOLUTION) ×2 IMPLANT
PACK TOTAL JOINT (CUSTOM PROCEDURE TRAY) ×2 IMPLANT
PAD ARMBOARD 7.5X6 YLW CONV (MISCELLANEOUS) ×4 IMPLANT
PILLOW ABDUCTION MEDIUM (MISCELLANEOUS) IMPLANT
PRESSURIZER FEMORAL UNIV (MISCELLANEOUS) IMPLANT
RETRIEVER SUT HEWSON (MISCELLANEOUS) ×2 IMPLANT
SET HNDPC FAN SPRY TIP SCT (DISPOSABLE) IMPLANT
SPACER FEM TAPERED +0 12/14 (Hips) ×1 IMPLANT
STAPLER VISISTAT 35W (STAPLE) ×2 IMPLANT
STEM SUMMIT PRESSFIT SZ 6 (Hips) ×1 IMPLANT
SUT ETHILON 2 0 PSLX (SUTURE) ×4 IMPLANT
SUT FIBERWIRE #2 26 STR GREEN (SUTURE) ×4
SUT FIBERWIRE #2 38 T-5 BLUE (SUTURE) ×4
SUT VIC AB 1 CT1 18XCR BRD 8 (SUTURE) ×1 IMPLANT
SUT VIC AB 1 CT1 27 (SUTURE) ×1
SUT VIC AB 1 CT1 27XBRD ANBCTR (SUTURE) ×1 IMPLANT
SUT VIC AB 1 CT1 8-18 (SUTURE) ×1
SUT VIC AB 2-0 CT1 27 (SUTURE) ×2
SUT VIC AB 2-0 CT1 TAPERPNT 27 (SUTURE) ×2 IMPLANT
SUTURE FIBERWR #2 26 STR GREEN (SUTURE) IMPLANT
SUTURE FIBERWR #2 38 T-5 BLUE (SUTURE) ×2 IMPLANT
TOWEL GREEN STERILE (TOWEL DISPOSABLE) ×2 IMPLANT
TOWEL GREEN STERILE FF (TOWEL DISPOSABLE) ×2 IMPLANT
TOWER CARTRIDGE SMART MIX (DISPOSABLE) IMPLANT
WATER STERILE IRR 1000ML POUR (IV SOLUTION) ×2 IMPLANT

## 2019-04-22 NOTE — Transfer of Care (Signed)
Immediate Anesthesia Transfer of Care Note  Patient: Nathan Alvarez  Procedure(s) Performed: ARTHROPLASTY BIPOLAR HIP (HEMIARTHROPLASTY) (Left Hip)  Patient Location: PACU  Anesthesia Type:Spinal  Level of Consciousness: drowsy  Airway & Oxygen Therapy: Patient Spontanous Breathing and Patient connected to face mask oxygen  Post-op Assessment: Report given to RN and Post -op Vital signs reviewed and stable  Post vital signs: Reviewed and stable  Last Vitals:  Vitals Value Taken Time  BP 119/67 04/22/19 1316  Temp    Pulse 61 04/22/19 1317  Resp 22 04/22/19 1317  SpO2 100 % 04/22/19 1317  Vitals shown include unvalidated device data.  Last Pain:  Vitals:   04/22/19 0819  TempSrc: Oral  PainSc:       Patients Stated Pain Goal: 1 (04/21/19 1951)  Complications: No apparent anesthesia complications

## 2019-04-22 NOTE — Consult Note (Signed)
[facility]thopaedic Trauma Service (OTS) Consult   Patient ID: Nathan Alvarez MRN: [id number] DOB/AGE: 09/02/1924 86 y.o.   Reason for Consult: Displaced left femoral neck fracture Referring Physician: Nevin BloodgoodShahmehdi, Seyed MD  HPI: Nathan Alvarez is an 86 y.o. male walker dependent ambulator who fell backward while on his walker resulting in acute left hip pain that prevented further ambulation, was not associated with any other injury, is currently 3/10, dull and aching but sharp when exacerbated with motion. No prior interventions.   Past Medical History:  Diagnosis Date  . A-fib (HCC)   . Dizziness   . DM (diabetes mellitus) (HCC)   . Esophagitis, reflux   . Heart block   . Hyperlipidemia   . Hypertension, essential, benign   . Memory deficit   . SOB (shortness of breath)   . SSS (sick sinus syndrome) [facility](HCC)     Past Surgical History:  Procedure Laterality Date  . CATARACT EXTRACTION Right   . INGUINAL HERNIA REPAIR    . INSERT / REPLACE / REMOVE PACEMAKER    . PACEMAKER INSERTION    . TONSILLECTOMY AND ADENOIDECTOMY      Family History  Problem Relation Age of Onset  . Heart disease Mother     Social History:  reports that he quit smoking about 63 years ago. He has never used smokeless tobacco. He reports that he does not drink alcohol or use drugs.  Allergies:  Allergies  Allergen Reactions  . Penicillins     unknown    Medications:  Prior to Admission:  Medications Prior to Admission  Medication Sig Dispense Refill Last Dose  . acetaminophen (TYLENOL) 650 MG CR tablet Take 1,300 mg by mouth daily.    04/20/2019 at Unknown time  . aspirin EC 81 MG tablet Take 81 mg by mouth daily.   04/20/2019 at Unknown time  . carvedilol (COREG) 6.25 MG tablet TAKE 1 TABLET BY MOUTH TWICE DAILY (Patient taking differently: Take 6.25 mg by mouth daily. ) 180 tablet 2 04/20/2019 at Unknown time  . hydrochlorothiazide (HYDRODIURIL) 25 MG tablet Take 25 mg by mouth daily.    04/20/2019 at Unknown time  . JANUVIA 50 MG tablet Take 50 mg by mouth daily.   04/20/2019 at Unknown time  . metoprolol succinate (TOPROL-XL) 25 MG 24 hr tablet Take 25 mg by mouth daily.    04/20/2019 at Unknown time  . polyethylene glycol powder (GLYCOLAX/MIRALAX) 17 GM/SCOOP powder Take by mouth.   04/20/2019 at Unknown time  . tamsulosin (FLOMAX) 0.4 MG CAPS capsule Take by mouth.   04/20/2019 at Unknown time  . furosemide (LASIX) 20 MG tablet Take 1 tablet (20 mg total) by mouth daily. (Patient not taking: Reported on 04/21/2019) 90 tablet 3 Not Taking at Unknown time    Results for orders placed or performed during the hospital encounter of 04/20/19 (from the past 48 hour(s))  Basic metabolic panel     Status: Abnormal   Collection Time: 04/20/19 11:03 PM  Result Value Ref Range   Sodium 123 (L) 135 - 145 mmol/L   Potassium 4.2 3.5 - 5.1 mmol/L   Chloride 91 (L) 98 - 111 mmol/L   CO2 24 22 - 32 mmol/L   Glucose, Bld 157 (H) 70 - 99 mg/dL   BUN 38 (H) 8 - 23 mg/dL   Creatinine, Ser 4.091.07 0.61 - 1.24 mg/dL   Calcium 8.7 (L) 8.9 - 10.3 mg/dL   GFR calc non Af Denyse DagoAmer  60 (L) >60 mL/min   GFR calc Af Amer >60 >60 mL/min   Anion gap 8 5 - 15    Comment: Performed at [facility], [address], [place], [place] [address]  CBC WITH DIFFERENTIAL     Status: Abnormal   Collection Time: 04/20/19 11:03 PM  Result Value Ref Range   WBC 11.6 (H) 4.0 - 10.5 K/uL   RBC 3.89 (L) 4.22 - 5.81 MIL/uL   Hemoglobin 11.5 (L) 13.0 - 17.0 g/dL   HCT 89.2 (L) 11.9 - 41.7 %   MCV 86.6 80.0 - 100.0 fL   MCH 29.6 26.0 - 34.0 pg   MCHC 34.1 30.0 - 36.0 g/dL   RDW 40.8 14.4 - 81.8 %   Platelets 314 150 - 400 K/uL   nRBC 0.0 0.0 - 0.2 %   Neutrophils Relative % 81 %   Neutro Abs 9.4 (H) 1.7 - 7.7 K/uL   Lymphocytes Relative 10 %   Lymphs Abs 1.1 0.7 - 4.0 K/uL   Monocytes Relative 6 %   Monocytes Absolute 0.6 0.1 - 1.0 K/uL   Eosinophils Relative 2 %   Eosinophils Absolute 0.2 0.0 - 0.5 K/uL   Basophils  Relative 0 %   Basophils Absolute 0.0 0.0 - 0.1 K/uL   Immature Granulocytes 1 %   Abs Immature Granulocytes 0.11 (H) 0.00 - 0.07 K/uL    Comment: Performed at [facility], [address], [place], [place] [address]  Protime-INR     Status: None   Collection Time: 04/20/19 11:03 PM  Result Value Ref Range   Prothrombin Time 13.8 11.4 - 15.2 seconds   INR 1.1 0.8 - 1.2    Comment: (NOTE) INR goal varies based on device and disease states. Performed at [facility], [address] [place], [place] [address]   Type and screen [facility]     Status: None   Collection Time: 04/20/19 11:03 PM  Result Value Ref Range   ABO/RH(D) A POS    Antibody Screen NEG    Sample Expiration      04/23/2019,2359 Performed at [facility], [address] [place], [place] [address]   Respiratory Panel by RT PCR (Flu A&B, Covid) - Nasopharyngeal Swab     Status: None   Collection Time: 04/20/19 11:54 PM   Specimen: Nasopharyngeal Swab  Result Value Ref Range   SARS Coronavirus 2 by RT PCR NEGATIVE NEGATIVE    Comment: (NOTE) SARS-CoV-2 target nucleic acids are NOT DETECTED. The SARS-CoV-2 RNA is generally detectable in upper respiratoy specimens during the acute phase of infection. The lowest concentration of SARS-CoV-2 viral copies this assay can detect is 131 copies/mL. A negative result does not preclude SARS-Cov-2 infection and should not be used as the sole basis for treatment or other patient management decisions. A negative result may occur with  improper specimen collection/handling, submission of specimen other than nasopharyngeal swab, presence of viral mutation(s) within the areas targeted by this assay, and inadequate number of viral copies (<131 copies/mL). A negative result must be combined with clinical observations, patient history, and epidemiological information. The expected result is Negative. Fact Sheet for Patients:  https://www.moore.com/ Fact  Sheet for Healthcare Providers:  https://www.young.biz/ This test is not yet ap proved or cleared by the [place] FDA and  has been authorized for detection and/or diagnosis of SARS-CoV-2 by FDA under an Emergency Use Authorization (EUA). This EUA will remain  in effect (meaning this test can be used) for  the duration of the COVID-19 declaration under Section 564(b)(1) of the Act, 21 U.S.C. section 360bbb-3(b)(1), unless the authorization is terminated or revoked sooner.    Influenza A by PCR NEGATIVE NEGATIVE   Influenza B by PCR NEGATIVE NEGATIVE    Comment: (NOTE) The Xpert Xpress SARS-CoV-2/FLU/RSV assay is intended as an aid in  the diagnosis of influenza from Nasopharyngeal swab specimens and  should not be used as a sole basis for treatment. Nasal washings and  aspirates are unacceptable for Xpert Xpress SARS-CoV-2/FLU/RSV  testing. Fact Sheet for Patients: PinkCheek.be Fact Sheet for Healthcare Providers: GravelBags.it This test is not yet approved or cleared by the [place] FDA and  has been authorized for detection and/or diagnosis of SARS-CoV-2 by  FDA under an Emergency Use Authorization (EUA). This EUA will remain  in effect (meaning this test can be used) for the duration of the  Covid-19 declaration under Section 564(b)(1) of the Act, 21  U.S.C. section 360bbb-3(b)(1), unless the authorization is  terminated or revoked. Performed at [facility], [address] [place], [place] [address]   Comprehensive metabolic panel     Status: Abnormal   Collection Time: 04/21/19  4:15 AM  Result Value Ref Range   Sodium 125 (L) 135 - 145 mmol/L   Potassium 4.2 3.5 - 5.1 mmol/L   Chloride 90 (L) 98 - 111 mmol/L   CO2 26 22 - 32 mmol/L   Glucose, Bld 173 (H) 70 - 99 mg/dL   BUN 39 (H) 8 - 23 mg/dL   Creatinine, Ser 1.02 0.61 - 1.24 mg/dL   Calcium 8.8 (L) 8.9 - 10.3 mg/dL   Total Protein  7.1 6.5 - 8.1 g/dL   Albumin 3.6 3.5 - 5.0 g/dL   AST 16 15 - 41 U/L   ALT 13 0 - 44 U/L   Alkaline Phosphatase 100 38 - 126 U/L   Total Bilirubin 0.9 0.3 - 1.2 mg/dL   GFR calc non Af Amer >60 >60 mL/min   GFR calc Af Amer >60 >60 mL/min   Anion gap 9 5 - 15    Comment: Performed at [facility], [address] [place], [place] [address]  CBC     Status: Abnormal   Collection Time: 04/21/19  4:15 AM  Result Value Ref Range   WBC 18.4 (H) 4.0 - 10.5 K/uL   RBC 4.00 (L) 4.22 - 5.81 MIL/uL   Hemoglobin 11.5 (L) 13.0 - 17.0 g/dL   HCT 34.9 (L) 39.0 - 52.0 %   MCV 87.3 80.0 - 100.0 fL   MCH 28.8 26.0 - 34.0 pg   MCHC 33.0 30.0 - 36.0 g/dL   RDW 14.6 11.5 - 15.5 %   Platelets 296 150 - 400 K/uL   nRBC 0.0 0.0 - 0.2 %    Comment: Performed at [facility], [address] [place], [place] [address]  Osmolality     Status: None   Collection Time: 04/21/19  4:15 AM  Result Value Ref Range   Osmolality 277 275 - 295 mOsm/kg    Comment: Performed at [facility], [place] [address] [place], [place] [address]  Cortisol     Status: None   Collection Time: 04/21/19  4:15 AM  Result Value Ref Range   Cortisol, Plasma 28.7 ug/dL    Comment: (NOTE) AM    6.7 - 22.6 ug/dL PM   <10.0       ug/dL Performed at [facility]  [address], [place], [place] [address]   TSH     Status: None   Collection Time: 04/21/19  4:15 AM  Result Value Ref Range   TSH 2.214 0.350 - 4.500 uIU/mL    Comment: Performed by a 3rd Generation assay with a functional sensitivity of <=0.01 uIU/mL. Performed at [facility], [address], [place], [place] [address]   Hemoglobin A1c     Status: Abnormal   Collection Time: 04/21/19  4:15 AM  Result Value Ref Range   Hgb A1c MFr Bld 6.4 (H) 4.8 - 5.6 %    Comment: (NOTE) Pre diabetes:          5.7%-6.4% Diabetes:              >6.4% Glycemic control for   <7.0% adults with diabetes    Mean Plasma Glucose 136.98 mg/dL    Comment: Performed at [facility], [address] [place], [place] [address]  Procalcitonin - Baseline     Status: None   Collection Time: 04/21/19 10:09 AM  Result Value Ref Range   Procalcitonin <0.10 ng/mL    Comment:        Interpretation: PCT (Procalcitonin) <= 0.5 ng/mL: Systemic infection (sepsis) is not likely. Local bacterial infection is possible. (NOTE)       Sepsis PCT Algorithm           Lower Respiratory Tract                                      Infection PCT Algorithm    ----------------------------     ----------------------------         PCT < 0.25 ng/mL                PCT < 0.10 ng/mL         Strongly encourage             Strongly discourage   discontinuation of antibiotics    initiation of antibiotics    ----------------------------     -----------------------------       PCT 0.25 - 0.50 ng/mL            PCT 0.10 - 0.25 ng/mL               OR       >80% decrease in PCT            Discourage initiation of                                            antibiotics      Encourage discontinuation           of antibiotics    ----------------------------     -----------------------------         PCT >= 0.50 ng/mL              PCT 0.26 - 0.50 ng/mL               AND        <80% decrease in PCT             Encourage initiation of  antibiotics       Encourage continuation           of antibiotics    ----------------------------     -----------------------------        PCT >= 0.50 ng/mL                  PCT > 0.50 ng/mL               AND         increase in PCT                  Strongly encourage                                      initiation of antibiotics    Strongly encourage escalation           of antibiotics                                     -----------------------------                                           PCT <= 0.25 ng/mL                                                 OR                                        > 80% decrease in  PCT                                     Discontinue / Do not initiate                                             antibiotics Performed at [facility], [address] [place], [place] [address]   Lactic acid, plasma     Status: None   Collection Time: 04/21/19 10:09 AM  Result Value Ref Range   Lactic Acid, Venous 1.1 0.5 - 1.9 mmol/L    Comment: Performed at [facility], [address], [place], [place] [address]  Sodium, urine, random     Status: None   Collection Time: 04/21/19 10:56 AM  Result Value Ref Range   Sodium, Ur 63 mmol/L    Comment: Performed at [facility], [address] [place], [place] [address]  Osmolality, urine     Status: Abnormal   Collection Time: 04/21/19 10:57 AM  Result Value Ref Range   Osmolality, Ur 130 (L) 300 - 900 mOsm/kg    Comment: Performed at [facility], [address], [place], [place] [address]  AMBMP     Status: Abnormal   Collection Time: 04/22/19  3:27 AM  Result Value Ref Range  Sodium 130 (L) 135 - 145 mmol/L   Potassium 3.8 3.5 - 5.1 mmol/L   Chloride 98 98 - 111 mmol/L   CO2 22 22 - 32 mmol/L   Glucose, Bld 126 (H) 70 - 99 mg/dL   BUN 33 (H) 8 - 23 mg/dL   Creatinine, Ser 8.291.07 0.61 - 1.24 mg/dL   Calcium 8.4 (L) 8.9 - 10.3 mg/dL   GFR calc non Af Amer 60 (L) >60 mL/min   GFR calc Af Amer >60 >60 mL/min   Anion gap 10 5 - 15    Comment: Performed at [facility], [address] [place], [place] [id number]  CBC     Status: Abnormal   Collection Time: 04/22/19  3:27 AM  Result Value Ref Range   WBC 10.8 (H) 4.0 - 10.5 K/uL   RBC 3.68 (L) 4.22 - 5.81 MIL/uL   Hemoglobin 10.8 (L) 13.0 - 17.0 g/dL   HCT 08.631.5 (L) 57.839.0 - 46.952.0 %   MCV 85.6 80.0 - 100.0 fL   MCH 29.3 26.0 - 34.0 pg   MCHC 34.3 30.0 - 36.0 g/dL   RDW 62.914.7 52.811.5 - 41.315.5 %   Platelets 280 150 - 400 K/uL   nRBC 0.0 0.0 - 0.2 %    Comment: Performed at [facility], [address] [place], [place] [address]    DG Chest Port 1 View  Result Date:  04/20/2019 CLINICAL DATA:  Fall, left hip pain EXAM: PORTABLE CHEST 1 VIEW COMPARISON:  None. FINDINGS: Left pacer in place with leads in the right atrium and right ventricle. Heart is normal size. No confluent airspace opacities. No effusions. No acute bony abnormality. IMPRESSION: No active disease. Electronically Signed   By: Charlett NoseKevin  Dover M.D.   On: 04/20/2019 23:45   DG Hip Unilat With Pelvis 2-3 Views Left  Result Date: 04/20/2019 CLINICAL DATA:  Fall, deformity EXAM: DG HIP (WITH OR WITHOUT PELVIS) 2-3V LEFT COMPARISON:  None. FINDINGS: There is a left femoral neck fracture with varus angulation. No subluxation or dislocation. Mild symmetric degenerative changes in the hips bilaterally. Diffuse osteopenia. IMPRESSION: Left femoral neck fracture with varus angulation. Electronically Signed   By: Charlett NoseKevin  Dover M.D.   On: 04/20/2019 23:46    ROS Denies recent fever, bleeding abnormalities, urologic dysfunction, GI problems, or weight gain.  Blood pressure (!) 152/76, pulse 71, temperature 98.1 F (36.7 C), temperature source Oral, resp. rate 16, height 5\' 6"  (1.676 m), weight 61.7 kg, SpO2 92 %. Physical Exam NCAT, very pleasant RRR (paced) Lungs No wheezing Abdomen soft, NT LLE No traumatic wounds, ecchymosis, or rash  Tender hip and pain with motion  No knee or ankle effusion  Knee stable to varus/ valgus and anterior/posterior stress  Sens DPN, SPN, TN intact  Motor EHL, ext, flex, evers grossly intact  DP 2+, No edema Gait: Could not assess Coordination and balance: Could not assess   Assessment/Plan: Displaced left femoral neck fracture, pacemaker, hyponatremia, walker dependence  1. Hemiarthroplasty today with spinal 2. Check vit D 3. Postop PT and OT 4. Primary service will continue to manage sodium level and cardiac disease  I discussed with the patient the risks and benefits of surgery, including the possibility of death, heart attack, infection, nerve injury, vessel  injury, wound breakdown, arthritis, symptomatic hardware, DVT/ PE, loss of motion, malunion, nonunion, and need for further surgery among others. He acknowledged these risks and wished to proceed.  Weightbearing: WBAT LLE Insicional and  dressing care: OK to remove dressings after 72 hours if wished and leave open to air with dry gauze PRN Orthopedic device(s): patient has walker Showering: Absolutely VTE prophylaxis: Aspirin 81mg  qd patient to resume Pain control: Norco Follow - up plan: 2 weeks Contact information:  Myrene Galas MD, Montez Morita PA   Myrene Galas, MD [facility], [facility] [phone]  04/22/2019, 10:15 AM  [facility] [address] [place] [address] [phone] Collier Bullock (F)

## 2019-04-22 NOTE — Progress Notes (Signed)
PROGRESS NOTE    Patient: Nathan Alvarez                            PCP: Vivien Presto, MD                    DOB: 1925-03-21            DOA: 04/20/2019 [id number]             DOS: 04/22/2019, 1:35 PM   LOS: 1 day   CC: Fall, hip fracture From: Home Date of Service: The patient was seen and examined on 04/22/2019  Subjective:   The patient was seen and examined this morning.  Stable no acute distress Still complaining of hip pain with movement N.p.o. overnight in anticipation of hip surgery, ORIF   Brief Narrative:   Deitrick Ferreri  is a 86 y.o. male,  w gerd, hypertension, yperlipidemia, dm2, sick sinus syndrome s/p pacer, presents with fall. Pt was reaching for cookie when he fell backwards and landed on hip.   04/22/2019 -n.p.o. anticipation of open reduction internal fixation of left hip  Assessment & Plan:   Principal Problem:   Femoral neck fracture (HCC) Active Problems:   Hypertension   Diabetes (HCC)   Hyponatremia   Anemia  Left femoral neck fracture -Status post accidental fall -sustaining left hip fracture -N.p.o. overnight in anticipation of open reduction internal fixation today 04/22/2019 -We will continue with pain management Dilaudid 0.5mg  iv q4h prn  Zofran 4mg  iv q6h prn  Orthopedics consulted, appreciate input  Mild hyponatremia -Asymptomatic -improved -Likely due to dehydration, -Responded well to gentle IV fluid hydration -Sodium improved 125 >> 130 -A.m. cortisol normal at 28.7 -Serum osmolality normal 277, -TSH normal at 2.214 -Urine sodium 63, urine osmolality 130 (low)  Leukocytosis   -Likely reactive, procalcitonin less than 0.1, lactic acid 1.1, -No signs of sepsis WBC18.5 >> 1.8  -No antibiotic recommended at this time   Hypertension Cont Carvedilol 6.25mg  po bid Hold Lasix due to hyponatremia  ? Dm2 -prediabetic Hemoglobin A1c 6.4 -CBG 173, 126 fasting -Continue SSI -Diabetic diet  Anemia -Monitoring H&H,  remained stable   Cultures; None Antimicrobials: None   DVT prophylaxis: SCD/Compression stockings and Lovenox SQ Code Status:   Code Status: Full Code Family Communication: No family member present at bedside- attempt will be made to update daily The above findings and plan of care has been discussed with patient in detail,  they expressed understanding and agreement of above. Disposition Plan:   Anticipated 1-2 days home with home health versus SNF vs CIR Admission status:  NPATIEN   Consultants: Orthopedic  Procedures:   No admission procedures for hospital encounter.   04/22/2019 open reduction internal fixation of left hip  Antimicrobials:  Anti-infectives (From admission, onward)   None       Medication:  . [MAR Hold] aspirin EC  81 mg Oral Daily  . [MAR Hold] carvedilol  6.25 mg Oral BID  . [MAR Hold] enoxaparin (LOVENOX) injection  40 mg Subcutaneous Q24H    [MAR Hold] acetaminophen **OR** [MAR Hold] acetaminophen, fentaNYL (SUBLIMAZE) injection, [MAR Hold] fentaNYL (SUBLIMAZE) injection, [MAR Hold]  HYDROmorphone (DILAUDID) injection, [MAR Hold] ondansetron (ZOFRAN) IV, ondansetron (ZOFRAN) IV   Objective:   Vitals:   04/21/19 1928 04/22/19 0431 04/22/19 0819 04/22/19 1315  BP: (!) 144/74 130/70 (!) 152/76 119/67  Pulse: 79 66 71 73  Resp: 16 18 16  20  Temp: 98.7 F (37.1 C) 98.7 F (37.1 C) 98.1 F (36.7 C) (!) 97.3 F (36.3 C)  TempSrc: Oral Oral Oral   SpO2: 100% 91% 92% 100%  Weight:      Height:        Intake/Output Summary (Last 24 hours) at 04/22/2019 1335 Last data filed at 04/22/2019 1316 Gross per 24 hour  Intake 1640 ml  Output 910 ml  Net 730 ml   Filed Weights   04/20/19 2235  Weight: 61.7 kg     Examination:   Physical Exam  Constitution:  Alert, cooperative, no distress,  Appears calm and comfortable  Psychiatric: Normal and stable mood and affect, cognition intact,   HEENT: Normocephalic, PERRL, otherwise with in  Normal limits  Chest:Chest symmetric Cardio vascular:  S1/S2, RRR, No murmure, No Rubs or Gallops  pulmonary: Clear to auscultation bilaterally, respirations unlabored, negative wheezes / crackles Abdomen: Soft, non-tender, non-distended, bowel sounds,no masses, no organomegaly Muscular skeletal:  Left hip pain with limited range of motion  Otherwise Limited exam - in bed, able to move all 4 extremities,  Neuro: CNII-XII intact. , normal motor and sensation, reflexes intact  Extremities: No pitting edema lower extremities, +2 pulses  Skin: Dry, warm to touch, negative for any Rashes, No open wounds Wounds: per nursing documentation     LABs:  CBC Latest Ref Rng & Units 04/22/2019 04/21/2019 04/20/2019  WBC 4.0 - 10.5 K/uL 10.8(H) 18.4(H) 11.6(H)  Hemoglobin 13.0 - 17.0 g/dL 10.8(L) 11.5(L) 11.5(L)  Hematocrit 39.0 - 52.0 % 31.5(L) 34.9(L) 33.7(L)  Platelets 150 - 400 K/uL 280 296 314   CMP Latest Ref Rng & Units 04/22/2019 04/21/2019 04/20/2019  Glucose 70 - 99 mg/dL 126(H) 173(H) 157(H)  BUN 8 - 23 mg/dL 33(H) 39(H) 38(H)  Creatinine 0.61 - 1.24 mg/dL 1.07 1.02 1.07  Sodium 135 - 145 mmol/L 130(L) 125(L) 123(L)  Potassium 3.5 - 5.1 mmol/L 3.8 4.2 4.2  Chloride 98 - 111 mmol/L 98 90(L) 91(L)  CO2 22 - 32 mmol/L 22 26 24   Calcium 8.9 - 10.3 mg/dL 8.4(L) 8.8(L) 8.7(L)  Total Protein 6.5 - 8.1 g/dL - 7.1 -  Total Bilirubin 0.3 - 1.2 mg/dL - 0.9 -  Alkaline Phos 38 - 126 U/L - 100 -  AST 15 - 41 U/L - 16 -  ALT 0 - 44 U/L - 13 -        SIGNED: Deatra James, MD, FACP, FHM. [facility],  Pager [phone]  If 7PM-7AM, please contact night-coverage [email or web address] 04/22/2019, 1:35 PM

## 2019-04-22 NOTE — Anesthesia Preprocedure Evaluation (Addendum)
Anesthesia Evaluation  Patient identified by MRN, date of birth, ID band Patient awake    Reviewed: Allergy & Precautions, NPO status , Patient's Chart, lab work & pertinent test results  Airway Mallampati: II  TM Distance: >3 FB Neck ROM: Full    Dental  (+) Partial Upper, Poor Dentition, Missing, Dental Advisory Given   Pulmonary former smoker,    Pulmonary exam normal breath sounds clear to auscultation       Cardiovascular hypertension, Pt. on medications and Pt. on home beta blockers Normal cardiovascular exam+ dysrhythmias + pacemaker  Rhythm:Regular Rate:Normal  Hx/o SSS with insertion of permanent pacemaker   Neuro/Psych negative neurological ROS  negative psych ROS   GI/Hepatic negative GI ROS, Neg liver ROS,   Endo/Other  diabetes, Well Controlled, Type 2Hyperlipidemia  Renal/GU Hypo natremia due to Lasix therapy  negative genitourinary   Musculoskeletal Left femoral neck Fx   Abdominal   Peds  Hematology  (+) anemia , Lovenox prophylaxis- last dose 1/14   Anesthesia Other Findings   Reproductive/Obstetrics                            Anesthesia Physical Anesthesia Plan  ASA: III  Anesthesia Plan: Spinal   Post-op Pain Management:    Induction:   PONV Risk Score and Plan: 2 and Propofol infusion, Ondansetron and Treatment may vary due to age or medical condition  Airway Management Planned: Natural Airway, Nasal Cannula and Simple Face Mask  Additional Equipment:   Intra-op Plan:   Post-operative Plan: Extubation in OR  Informed Consent: I have reviewed the patients History and Physical, chart, labs and discussed the procedure including the risks, benefits and alternatives for the proposed anesthesia with the patient or authorized representative who has indicated his/her understanding and acceptance.     Dental advisory given  Plan Discussed with: CRNA and  Surgeon  Anesthesia Plan Comments:         Anesthesia Quick Evaluation

## 2019-04-22 NOTE — Anesthesia Postprocedure Evaluation (Signed)
Anesthesia Post Note  Patient: Nathan Alvarez  Procedure(s) Performed: ARTHROPLASTY BIPOLAR HIP (HEMIARTHROPLASTY) (Left Hip)     Patient location during evaluation: PACU Anesthesia Type: Spinal Level of consciousness: awake and alert Pain management: pain level controlled Vital Signs Assessment: post-procedure vital signs reviewed and stable Respiratory status: spontaneous breathing, nonlabored ventilation and respiratory function stable Cardiovascular status: blood pressure returned to baseline and stable Postop Assessment: no apparent nausea or vomiting Anesthetic complications: no    Last Vitals:  Vitals:   04/22/19 1330 04/22/19 1345  BP: 134/64 128/65  Pulse: (!) 57 61  Resp: 20 20  Temp:    SpO2: 94% 93%    Last Pain:  Vitals:   04/22/19 1345  TempSrc:   PainSc: 0-No pain                 Lowella Curb

## 2019-04-22 NOTE — Anesthesia Procedure Notes (Signed)
Spinal  Patient location during procedure: OR Start time: 04/22/2019 10:42 AM End time: 04/22/2019 10:45 AM Staffing Performed: anesthesiologist  Anesthesiologist: Mal Amabile, MD Preanesthetic Checklist Completed: patient identified, IV checked, site marked, risks and benefits discussed, surgical consent, monitors and equipment checked, pre-op evaluation and timeout performed Spinal Block Patient position: sitting Prep: DuraPrep and site prepped and draped Patient monitoring: heart rate, cardiac monitor, continuous pulse ox and blood pressure Approach: midline Location: L3-4 Injection technique: single-shot Needle Needle type: Pencan  Needle gauge: 24 G Needle length: 9 cm Needle insertion depth: 5.5 cm Assessment Sensory level: T6 Additional Notes Patient tolerated procedure well. Adequate sensory level.

## 2019-04-23 DIAGNOSIS — E1129 Type 2 diabetes mellitus with other diabetic kidney complication: Secondary | ICD-10-CM

## 2019-04-23 DIAGNOSIS — D649 Anemia, unspecified: Secondary | ICD-10-CM

## 2019-04-23 DIAGNOSIS — E871 Hypo-osmolality and hyponatremia: Secondary | ICD-10-CM

## 2019-04-23 DIAGNOSIS — I10 Essential (primary) hypertension: Secondary | ICD-10-CM

## 2019-04-23 DIAGNOSIS — S72002A Fracture of unspecified part of neck of left femur, initial encounter for closed fracture: Secondary | ICD-10-CM

## 2019-04-23 LAB — BASIC METABOLIC PANEL
Anion gap: 9 (ref 5–15)
BUN: 35 mg/dL — ABNORMAL HIGH (ref 8–23)
CO2: 23 mmol/L (ref 22–32)
Calcium: 8.2 mg/dL — ABNORMAL LOW (ref 8.9–10.3)
Chloride: 100 mmol/L (ref 98–111)
Creatinine, Ser: 1.13 mg/dL (ref 0.61–1.24)
GFR calc Af Amer: >60 mL/min (ref >60)
GFR calc non Af Amer: 56 mL/min — ABNORMAL LOW (ref >60)
Glucose, Bld: 129 mg/dL — ABNORMAL HIGH (ref 70–99)
Potassium: 3.9 mmol/L (ref 3.5–5.1)
Sodium: 132 mmol/L — ABNORMAL LOW (ref 135–145)

## 2019-04-23 LAB — CBC
HCT: 29.8 % — ABNORMAL LOW (ref 39.0–52.0)
Hemoglobin: 10.5 g/dL — ABNORMAL LOW (ref 13.0–17.0)
MCH: 29.9 pg (ref 26.0–34.0)
MCHC: 35.2 g/dL (ref 30.0–36.0)
MCV: 84.9 fL (ref 80.0–100.0)
Platelets: 249 10*3/uL (ref 150–400)
RBC: 3.51 MIL/uL — ABNORMAL LOW (ref 4.22–5.81)
RDW: 14.8 % (ref 11.5–15.5)
WBC: 9.9 10*3/uL (ref 4.0–10.5)
nRBC: 0 % (ref 0.0–0.2)

## 2019-04-23 LAB — VITAMIN D 25 HYDROXY (VIT D DEFICIENCY, FRACTURES): Vit D, 25-Hydroxy: 14.25 ng/mL — ABNORMAL LOW (ref 30–100)

## 2019-04-23 MED ORDER — CALCIUM CARBONATE 1250 (500 CA) MG PO TABS
1.0000 | ORAL_TABLET | Freq: Two times a day (BID) | ORAL | Status: DC
  Administered 2019-04-23 – 2019-04-27 (×8): 500 mg via ORAL
  Filled 2019-04-23 (×8): qty 1

## 2019-04-23 MED ORDER — POLYETHYLENE GLYCOL 3350 17 G PO PACK
17.0000 g | PACK | Freq: Two times a day (BID) | ORAL | Status: DC
  Administered 2019-04-23 – 2019-04-27 (×5): 17 g via ORAL
  Filled 2019-04-23 (×7): qty 1

## 2019-04-23 MED ORDER — TAMSULOSIN HCL 0.4 MG PO CAPS
0.4000 mg | ORAL_CAPSULE | Freq: Every day | ORAL | Status: DC
  Administered 2019-04-23 – 2019-04-26 (×4): 0.4 mg via ORAL
  Filled 2019-04-23 (×4): qty 1

## 2019-04-23 MED ORDER — SENNA 8.6 MG PO TABS: 1.0000 | ORAL_TABLET | Freq: Every day | ORAL | Status: DC | PRN

## 2019-04-23 NOTE — Evaluation (Signed)
Occupational Therapy Evaluation Patient Details Name: Nathan Alvarez MRN: [id number] DOB: 1924/10/26 Today's Date: 04/23/2019    History of Present Illness Pt is a 86 y/o male admitted after falling at home in which he sustained a displaced L femoral neck fracture, now s/p arthroplasty bipolar hip (L). PMH including but not limited to a-fib, DM, HTN and pacemaker.   Clinical Impression   This 86 yo male admitted with and underwent above presents to acute OT with PLOF of being totally independent with basic and IADLs living alone. Now patient has increased pain, decreased mobility and posterior hip precautions making him setup/S for UB ADLs but max A-total A for LB ADLs with +2 min A for safety when up on his feet. He will benefit from acute OT with follow OT at SNF.    Follow Up Recommendations  SNF;Supervision/Assistance - 24 hour    Equipment Recommendations  Other (comment)(TBD next venue)       Precautions / Restrictions Precautions Precautions: Fall;Posterior Hip Precaution Booklet Issued: Yes (comment) Precaution Comments: reviewed all precautions with pt throughout session Restrictions Weight Bearing Restrictions: Yes LLE Weight Bearing: Weight bearing as tolerated      Mobility Bed Mobility Overal bed mobility: Needs Assistance Bed Mobility: Supine to Sit     Supine to sit: Mod assist;+2 for physical assistance;HOB elevated     General bed mobility comments: increased time and effort, cueing for sequencing, use of bed pads to position pt's hips at EOB  Transfers Overall transfer level: Needs assistance Equipment used: Rolling walker (2 wheeled) Transfers: Sit to/from Stand Sit to Stand: Min assist;From elevated surface         General transfer comment: cueing for safe hand placement, min A to power into standing from EOB and for stability with transitional movement    Balance Overall balance assessment: Needs assistance Sitting-balance support: Feet  supported Sitting balance-Leahy Scale: Fair     Standing balance support: Bilateral upper extremity supported Standing balance-Leahy Scale: Poor Standing balance comment: reliant on UEs on RW                           ADL either performed or assessed with clinical judgement   ADL Overall ADL's : Needs assistance/impaired Eating/Feeding: Independent;Sitting Eating/Feeding Details (indicate cue type and reason): in recliner Grooming: Set up;Sitting Grooming Details (indicate cue type and reason): in recliner Upper Body Bathing: Set up;Sitting Upper Body Bathing Details (indicate cue type and reason): in recliner Lower Body Bathing: Maximal assistance Lower Body Bathing Details (indicate cue type and reason): min A +2 sit<>stand safety Upper Body Dressing : Minimal assistance;Sitting Upper Body Dressing Details (indicate cue type and reason): in recliner Lower Body Dressing: Total assistance Lower Body Dressing Details (indicate cue type and reason): min A +2 sit<>stand safety Toilet Transfer: Minimal assistance;+2 for safety/equipment;RW Toilet Transfer Details (indicate cue type and reason): bed> a little ambulation in room>sit in recliner Toileting- Clothing Manipulation and Hygiene: Moderate assistance Toileting - Clothing Manipulation Details (indicate cue type and reason): min A +2 sit<>stand safety             Vision Patient Visual Report: No change from baseline              Pertinent Vitals/Pain Pain Assessment: 0-10 Pain Score: 5  Pain Location: L hip Pain Descriptors / Indicators: Grimacing;Guarding Pain Intervention(s): Monitored during session;Repositioned     Hand Dominance  right   Extremity/Trunk Assessment Upper Extremity Assessment Upper  Extremity Assessment: Generalized weakness   Lower Extremity Assessment Lower Extremity Assessment: LLE deficits/detail LLE Deficits / Details: pt with decreased strength and ROM limitations secondary  to post-op pain and weakness   Cervical / Trunk Assessment Cervical / Trunk Assessment: Kyphotic   Communication Communication Communication: HOH   Cognition Arousal/Alertness: Awake/alert Behavior During Therapy: WFL for tasks assessed/performed Overall Cognitive Status: Impaired/Different from baseline Area of Impairment: Problem solving                             Problem Solving: Difficulty sequencing;Requires verbal cues                Home Living Family/patient expects to be discharged to:: Private residence Living Arrangements: Alone Available Help at Discharge: Family;Available PRN/intermittently Type of Home: House Home Access: Stairs to enter [employer] of Steps: 3 Entrance Stairs-Rails: Right;Left Home Layout: One level     Bathroom Shower/Tub: Producer, television/film/video: Standard     Home Equipment: Environmental consultant - 2 wheels;Grab bars - tub/shower;Shower seat          Prior Functioning/Environment Level of Independence: Independent with assistive device(s)        Comments: ambulates with use of RW, drives, wears prescription glasses        OT Problem List: Decreased strength;Decreased range of motion;Impaired balance (sitting and/or standing);Pain;Decreased knowledge of precautions      OT Treatment/Interventions: Self-care/ADL training;DME and/or AE instruction;Patient/family education;Balance training    OT Goals(Current goals can be found in the care plan section) Acute Rehab OT Goals Patient Stated Goal: to go to rehab and then home OT Goal Formulation: With patient Time For Goal Achievement: 05/07/19 Potential to Achieve Goals: Good  OT Frequency: Min 1X/week   Barriers to D/C: Decreased caregiver support          Co-evaluation PT/OT/SLP Co-Evaluation/Treatment: Yes Reason for Co-Treatment: For patient/therapist safety;To address functional/ADL transfers PT goals addressed during session: Mobility/safety  with mobility;Balance;Proper use of DME;Strengthening/ROM OT goals addressed during session: ADL's and self-care;Strengthening/ROM      AM-PAC OT "6 Clicks" Daily Activity     Outcome Measure Help from another person eating meals?: None Help from another person taking care of personal grooming?: A Little Help from another person toileting, which includes using toliet, bedpan, or urinal?: A Lot Help from another person bathing (including washing, rinsing, drying)?: A Lot Help from another person to put on and taking off regular upper body clothing?: A Little Help from another person to put on and taking off regular lower body clothing?: Total 6 Click Score: 15   End of Session Equipment Utilized During Treatment: Gait belt;Rolling walker  Activity Tolerance: Patient tolerated treatment well Patient left: in chair;with call bell/phone within reach(alarm not set due to no batteries on unit to put in chair alarm)  OT Visit Diagnosis: Unsteadiness on feet (R26.81);Other abnormalities of gait and mobility (R26.89);Muscle weakness (generalized) (M62.81);Pain Pain - Right/Left: Left Pain - part of body: Hip                Time: 1610-9604 OT Time Calculation (min): 23 min Charges:  OT General Charges $OT Visit: 1 Visit OT Evaluation $OT Eval Moderate Complexity: 1 Mod  Cathy  OTR/L Acute [employer] Pager [phone] Office [phone]     04/23/2019, 2:16 PM

## 2019-04-23 NOTE — Evaluation (Signed)
Physical Therapy Evaluation Patient Details Name: Nathan Alvarez MRN: [id number] DOB: Mar 11, 1925 Today's Date: 04/23/2019   History of Present Illness  Pt is a 86 y/o male admitted after falling at home in which he sustained a displaced L femoral neck fracture, now s/p arthroplasty bipolar hip (L). PMH including but not limited to a-fib, DM, HTN and pacemaker.    Clinical Impression  Pt presented supine in bed with HOB elevated, awake and willing to participate in therapy session. Prior to admission, pt reported that he ambulated with use of a RW and was independent with ADLs/IADLs. Pt lives alone in a single level house with three steps to enter. His son lives nearby but works during the day. At the time of evaluation, pt required mod A x2 for bed mobility, min A for transfers and min A x2 to ambulate a very short distance in his room with the RW. Pt would greatly benefit from further intensive therapy services at a SNF to maximize his independence with functional mobility prior to returning home alone. PT will continue to follow pt acutely to progress mobility as tolerated per PT POC.    Follow Up Recommendations SNF     Equipment Recommendations  None recommended by PT    Recommendations for Other Services       Precautions / Restrictions Precautions Precautions: Fall;Posterior Hip Precaution Booklet Issued: Yes (comment) Precaution Comments: reviewed all precautions with pt throughout session Restrictions Weight Bearing Restrictions: Yes LLE Weight Bearing: Weight bearing as tolerated      Mobility  Bed Mobility Overal bed mobility: Needs Assistance Bed Mobility: Supine to Sit     Supine to sit: Mod assist;+2 for physical assistance;HOB elevated     General bed mobility comments: increased time and effort, cueing for sequencing, use of bed pads to position pt's hips at EOB  Transfers Overall transfer level: Needs assistance Equipment used: Rolling walker (2  wheeled) Transfers: Sit to/from Stand Sit to Stand: Min assist;From elevated surface         General transfer comment: cueing for safe hand placement, min A to power into standing from EOB and for stability with transitional movement  Ambulation/Gait Ambulation/Gait assistance: Min assist;+2 safety/equipment Gait Distance (Feet): 10 Feet Assistive device: Rolling walker (2 wheeled) Gait Pattern/deviations: Step-to pattern;Decreased step length - right;Decreased step length - left;Decreased stance time - left;Decreased stride length;Decreased weight shift to left Gait velocity: decreased   General Gait Details: pt with mild instability with use of RW to ambulate a short distance; min A for RW management, lines and safety  Stairs            Wheelchair Mobility    Modified Rankin (Stroke Patients Only)       Balance Overall balance assessment: Needs assistance Sitting-balance support: Feet supported Sitting balance-Leahy Scale: Fair     Standing balance support: Bilateral upper extremity supported Standing balance-Leahy Scale: Poor Standing balance comment: reliant on UEs on RW                             Pertinent Vitals/Pain Pain Assessment: 0-10 Pain Score: 5  Pain Location: L hip Pain Descriptors / Indicators: Grimacing;Guarding Pain Intervention(s): Monitored during session;Repositioned    Home Living Family/patient expects to be discharged to:: Private residence Living Arrangements: Alone Available Help at Discharge: Family;Available PRN/intermittently Type of Home: House Home Access: Stairs to enter Entrance Stairs-Rails: Right;Left Entrance Stairs-Number of Steps: 3 Home Layout: One level Home  Equipment: Walker - 2 wheels;Grab bars - tub/shower;Shower seat      Prior Function Level of Independence: Independent with assistive device(s)         Comments: ambulates with use of RW, drives, wears prescription glasses     Hand  Dominance        Extremity/Trunk Assessment   Upper Extremity Assessment Upper Extremity Assessment: Defer to OT evaluation    Lower Extremity Assessment Lower Extremity Assessment: LLE deficits/detail LLE Deficits / Details: pt with decreased strength and ROM limitations secondary to post-op pain and weakness    Cervical / Trunk Assessment Cervical / Trunk Assessment: Kyphotic  Communication   Communication: HOH  Cognition Arousal/Alertness: Awake/alert Behavior During Therapy: WFL for tasks assessed/performed Overall Cognitive Status: Impaired/Different from baseline Area of Impairment: Problem solving                             Problem Solving: Difficulty sequencing;Requires verbal cues        General Comments      Exercises     Assessment/Plan    PT Assessment Patient needs continued PT services  PT Problem List Decreased strength;Decreased activity tolerance;Decreased range of motion;Decreased balance;Decreased coordination;Decreased mobility;Decreased knowledge of use of DME;Decreased safety awareness;Decreased knowledge of precautions;Pain       PT Treatment Interventions DME instruction;Gait training;Stair training;Therapeutic activities;Therapeutic exercise;Functional mobility training;Balance training;Neuromuscular re-education;Patient/family education    PT Goals (Current goals can be found in the Care Plan section)  Acute Rehab PT Goals Patient Stated Goal: to get stronger PT Goal Formulation: With patient Time For Goal Achievement: 05/07/19 Potential to Achieve Goals: Good    Frequency Min 3X/week   Barriers to discharge        Co-evaluation PT/OT/SLP Co-Evaluation/Treatment: Yes Reason for Co-Treatment: For patient/therapist safety;To address functional/ADL transfers PT goals addressed during session: Mobility/safety with mobility;Balance;Proper use of DME;Strengthening/ROM         AM-PAC PT "6 Clicks" Mobility  Outcome  Measure Help needed turning from your back to your side while in a flat bed without using bedrails?: A Lot Help needed moving from lying on your back to sitting on the side of a flat bed without using bedrails?: A Lot Help needed moving to and from a bed to a chair (including a wheelchair)?: A Little Help needed standing up from a chair using your arms (e.g., wheelchair or bedside chair)?: A Little Help needed to walk in hospital room?: A Little Help needed climbing 3-5 steps with a railing? : A Lot 6 Click Score: 15    End of Session Equipment Utilized During Treatment: Gait belt Activity Tolerance: Patient tolerated treatment well Patient left: in chair;with call bell/phone within reach Nurse Communication: Mobility status PT Visit Diagnosis: Other abnormalities of gait and mobility (R26.89);Pain Pain - Right/Left: Left Pain - part of body: Hip    Time: 9924-2683 PT Time Calculation (min) (ACUTE ONLY): 17 min   Charges:   PT Evaluation $PT Eval Moderate Complexity: 1 Mod          Eduard Clos, PT, DPT  Acute Rehabilitation Services Pager [phone] Office [place] 04/23/2019, 11:55 AM

## 2019-04-23 NOTE — Progress Notes (Signed)
PROGRESS NOTE    Nathan Alvarez  CHY:[id number] DOB: 04-17-1924 DOA: 04/20/2019 PCP: Curly Rim, MD   Brief Narrative:    86 year old gentleman with history of sick sinus syndrome status post pacemaker placement, hypertension and diabetes who presented with a left femoral neck fracture.  He underwent left hip arthroplasty on January 15.  Hospital course has been complicated by hyponatremia which is improving.    Assessment & Plan:   Principal Problem:   Femoral neck fracture (HCC) Active Problems:   Hypertension   Diabetes ([place])   Hyponatremia   Anemia  Left femoral neck fracture, status post arthroplasty on 1/15. -Orthopedics consult appreciate input and care -Vitamin D is low, started on calcium and vitamin D. -Recommend consideration of bisphosphonate therapy in 6 to 8 weeks -PT consulted, recommends SNF.  Consult for transitions of care.  History of atrial fibrillation, status post pacemaker history of placement. -On aspirin therapy.  Continue carvedilol.  Hyponatremia, likely due to to dehydration as well as medication (HCTZ) - Restart HCTZ as tolerated (likely 1/17)   Type 2 diabetes, meets criteria for diabetes by fasting blood sugars -Continue low-dose lispro.  Acute blood loss anemia, normocytic.  Hemoglobin stable continue to monitor.  BPH - Restart Flomax  - Bladder scans qshift  DVT prophylaxis: Enoxaparin   Code Status: FULL  Family Communication: Discussed with patient  Disposition Plan: SNF , TOC consulted     Consultants:   Orthopedics   Procedures:   Left hip arthroplasty 1/15  Antimicrobials:   None     Subjective: Patient reports his pain is controlled.  He denies dizziness, chest pain, difficulty breathing.  He reports he is urinating well.  Objective: Vitals:   04/23/19 0403 04/23/19 0500 04/23/19 0846 04/23/19 1405  BP: (!) 127/50  125/61 (!) 86/69  Pulse: 61  68 75  Resp:   18 15  Temp: 97.7 F (36.5 C)  (!) 97.5  F (36.4 C) 97.6 F (36.4 C)  TempSrc: Oral  Oral Oral  SpO2: 90%  92% 97%  Weight:  66.9 kg    Height:        Intake/Output Summary (Last 24 hours) at 04/23/2019 1447 Last data filed at 04/23/2019 1100 Gross per 24 hour  Intake 600 ml  Output --  Net 600 ml   Filed Weights   04/20/19 2235 04/23/19 0500  Weight: 61.7 kg 66.9 kg    Examination:  General exam: Appears calm and comfortable  Respiratory system: Clear to auscultation. Respiratory effort normal. Cardiovascular system: S1 & S2 heard, RRR.  Gastrointestinal system: Abdomen is mildly distended, soft and nontender. No organomegaly or masses felt. Normal bowel sounds heard. Central nervous system: Alert and oriented.  Skin: No rashes, lesions or ulcers Psychiatry: Judgement and insight appear normal.  Data Reviewed: I have personally reviewed following labs and imaging studies  CBC: Recent Labs  Lab 04/20/19 2303 04/21/19 0415 04/22/19 0327 04/23/19 0348  WBC 11.6* 18.4* 10.8* 9.9  NEUTROABS 9.4*  --   --   --   HGB 11.5* 11.5* 10.8* 10.5*  HCT 33.7* 34.9* 31.5* 29.8*  MCV 86.6 87.3 85.6 84.9  PLT 314 296 280 878   Basic Metabolic Panel: Recent Labs  Lab 04/20/19 2303 04/21/19 0415 04/22/19 0327 04/23/19 0348  NA 123* 125* 130* 132*  K 4.2 4.2 3.8 3.9  CL 91* 90* 98 100  CO2 24 26 22 23   GLUCOSE 157* 173* 126* 129*  BUN 38* 39* 33* 35*  CREATININE  1.07 1.02 1.07 1.13  CALCIUM 8.7* 8.8* 8.4* 8.2*     Radiology Studies: DG Hip Port Unilat With Pelvis 1V Left  Result Date: 04/22/2019 CLINICAL DATA:  Post arthroplasty EXAM: DG HIP (WITH OR WITHOUT PELVIS) 1V PORT LEFT COMPARISON:  None. FINDINGS: Interval unipolar LEFT hip arthroplasty for surgical repair of femoral neck fracture. Postsurgical change in the soft tissues. No dislocation or fracture IMPRESSION: No complication following unipolar hip arthroplasty Electronically Signed   By: Genevive Bi M.D.   On: 04/22/2019 14:16         Scheduled Meds: . acetaminophen  500 mg Oral Q8H  . vitamin C  500 mg Oral Daily  . aspirin EC  81 mg Oral Daily  . calcium carbonate  1 tablet Oral BID WC  . carvedilol  6.25 mg Oral BID  . Chlorhexidine Gluconate Cloth  6 each Topical Daily  . cholecalciferol  2,000 Units Oral BID  . enoxaparin (LOVENOX) injection  40 mg Subcutaneous Q24H  . polyethylene glycol  17 g Oral BID   Continuous Infusions: . lactated ringers 10 mL/hr at 04/22/19 0851     LOS: 2 days    Time spent: >35 minutes   Terisa Starr, MD   [facility] Pager [phone]  If 7PM-7AM, please contact night-coverage [email or web address] Password [facility] 04/23/2019, 2:47 PM

## 2019-04-23 NOTE — Progress Notes (Signed)
    Subjective: Patient reports pain as mild, controlled.  Motivated to get better.   Tolerating diet.  No CP, SOB.    Objective:   VITALS:   Vitals:   04/23/19 0022 04/23/19 0403 04/23/19 0500 04/23/19 0846  BP: (!) 122/59 (!) 127/50  125/61  Pulse: 62 61  68  Resp:    18  Temp: 97.6 F (36.4 C) 97.7 F (36.5 C)  (!) 97.5 F (36.4 C)  TempSrc: Oral Oral  Oral  SpO2: 91% 90%  92%  Weight:   66.9 kg   Height:       CBC Latest Ref Rng & Units 04/23/2019 04/22/2019 04/21/2019  WBC 4.0 - 10.5 K/uL 9.9 10.8(H) 18.4(H)  Hemoglobin 13.0 - 17.0 g/dL 10.5(L) 10.8(L) 11.5(L)  Hematocrit 39.0 - 52.0 % 29.8(L) 31.5(L) 34.9(L)  Platelets 150 - 400 K/uL 249 280 296   BMP Latest Ref Rng & Units 04/23/2019 04/22/2019 04/21/2019  Glucose 70 - 99 mg/dL 673(A) 193(X) 902(I)  BUN 8 - 23 mg/dL 09(B) 35(H) 29(J)  Creatinine 0.61 - 1.24 mg/dL 2.42 6.83 4.19  BUN/Creat Ratio 10 - 24 - - -  Sodium 135 - 145 mmol/L 132(L) 130(L) 125(L)  Potassium 3.5 - 5.1 mmol/L 3.9 3.8 4.2  Chloride 98 - 111 mmol/L 100 98 90(L)  CO2 22 - 32 mmol/L 23 22 26   Calcium 8.9 - 10.3 mg/dL 8.2(L) 8.4(L) 8.8(L)   Intake/Output      01/15 0701 - 01/16 0700 01/16 0701 - 01/17 0700   P.O. 360 240   I.V. (mL/kg) 1400 (20.9)    Total Intake(mL/kg) 1760 (26.3) 240 (3.6)   Urine (mL/kg/hr) 200 (0.1)    Blood 150    Total Output 350    Net +1410 +240        Urine Occurrence 2 x        Physical Exam: General: NAD.  Upright in chair.  Calm, conversant.  No increased work of breathing. MSK LLE Neurovascularly intact Sensation intact distally Feet warm Knee flexion, ankle dorsiflexion/Plantar flexion intact Incision: dressing C/D/I   Assessment: 1 Day Post-Op  S/P Procedure(s) (LRB): ARTHROPLASTY BIPOLAR HIP (HEMIARTHROPLASTY) (Left) by Dr. 2/17 on 04/22/2019  Principal Problem:   Femoral neck fracture (HCC) Active Problems:   Hypertension   Diabetes (HCC)   Hyponatremia   Anemia   Closed left femoral  neck fracture, status post hemiarthroplasty Doing well postop day 1 Tolerating diet and voiding Pain controlled Not yet mobilized-previously ambulatory with a walker or cane  Plan: Up with therapy Incentive Spirometry Apply ice PRN Vitamin D  Weightbearing: WBAT LLE.  Posterior hip precautions Insicional and dressing care: Mepilex as needed.  Likely will change tomorrow. Showering: Keep dressing dry VTE prophylaxis: Lovenox, SCDs, ambulation Pain control: Minimize narcotics.  Continue current regimen. Follow - up plan: 2 weeks  Dispo: TBD.  Therapy evaluations pending.  Discharge when mobilized and ready medically.    Lives alone.  Son lives a few houses away.  Ambulatory with walker or cane.   04/24/2019 Martensen III, PA-C 04/23/2019, 11:48 AM

## 2019-04-23 NOTE — Plan of Care (Signed)
  Problem: Clinical Measurements: Goal: Will remain free from infection Outcome: Progressing   Problem: Activity: Goal: Risk for activity intolerance will decrease Outcome: Progressing   Problem: Pain Managment: Goal: General experience of comfort will improve Outcome: Progressing   Problem: Safety: Goal: Ability to remain free from injury will improve Outcome: Progressing   

## 2019-04-24 LAB — BASIC METABOLIC PANEL
Anion gap: 8 (ref 5–15)
BUN: 36 mg/dL — ABNORMAL HIGH (ref 8–23)
CO2: 23 mmol/L (ref 22–32)
Calcium: 8.1 mg/dL — ABNORMAL LOW (ref 8.9–10.3)
Chloride: 104 mmol/L (ref 98–111)
Creatinine, Ser: 0.89 mg/dL (ref 0.61–1.24)
GFR calc Af Amer: >60 mL/min (ref >60)
GFR calc non Af Amer: >60 mL/min (ref >60)
Glucose, Bld: 141 mg/dL — ABNORMAL HIGH (ref 70–99)
Potassium: 3.9 mmol/L (ref 3.5–5.1)
Sodium: 135 mmol/L (ref 135–145)

## 2019-04-24 LAB — CBC
HCT: 29 % — ABNORMAL LOW (ref 39.0–52.0)
Hemoglobin: 10.1 g/dL — ABNORMAL LOW (ref 13.0–17.0)
MCH: 29.6 pg (ref 26.0–34.0)
MCHC: 34.8 g/dL (ref 30.0–36.0)
MCV: 85 fL (ref 80.0–100.0)
Platelets: 249 10*3/uL (ref 150–400)
RBC: 3.41 MIL/uL — ABNORMAL LOW (ref 4.22–5.81)
RDW: 15.1 % (ref 11.5–15.5)
WBC: 10.8 10*3/uL — ABNORMAL HIGH (ref 4.0–10.5)
nRBC: 0 % (ref 0.0–0.2)

## 2019-04-24 LAB — TYPE AND SCREEN
ABO/RH(D): A POS
Antibody Screen: NEGATIVE

## 2019-04-24 LAB — ABO/RH: ABO/RH(D): A POS

## 2019-04-24 NOTE — Progress Notes (Signed)
    Subjective: Patient reports pain as improving, mild, controlled.  Motivated to get better.   Tolerating diet.  No CP, SOB.    Objective:   VITALS:   Vitals:   04/23/19 2124 04/23/19 2130 04/24/19 0450 04/24/19 0600  BP: (!) 154/55  139/63   Pulse: 63  64   Resp:  18    Temp: (!) 97.5 F (36.4 C)  98.4 F (36.9 C)   TempSrc: Oral  Oral   SpO2: 93%  93%   Weight:    63.9 kg  Height:       CBC Latest Ref Rng & Units 04/23/2019 04/22/2019 04/21/2019  WBC 4.0 - 10.5 K/uL 9.9 10.8(H) 18.4(H)  Hemoglobin 13.0 - 17.0 g/dL 10.5(L) 10.8(L) 11.5(L)  Hematocrit 39.0 - 52.0 % 29.8(L) 31.5(L) 34.9(L)  Platelets 150 - 400 K/uL 249 280 296   BMP Latest Ref Rng & Units 04/23/2019 04/22/2019 04/21/2019  Glucose 70 - 99 mg/dL 357(S) 177(L) 390(Z)  BUN 8 - 23 mg/dL 00(P) 23(R) 00(T)  Creatinine 0.61 - 1.24 mg/dL 6.22 6.33 3.54  BUN/Creat Ratio 10 - 24 - - -  Sodium 135 - 145 mmol/L 132(L) 130(L) 125(L)  Potassium 3.5 - 5.1 mmol/L 3.9 3.8 4.2  Chloride 98 - 111 mmol/L 100 98 90(L)  CO2 22 - 32 mmol/L 23 22 26   Calcium 8.9 - 10.3 mg/dL 8.2(L) 8.4(L) 8.8(L)   Intake/Output      01/16 0701 - 01/17 0700 01/17 0701 - 01/18 0700   P.O. 480    I.V. (mL/kg)     Total Intake(mL/kg) 480 (7.5)    Urine (mL/kg/hr) 600 (0.4)    Blood     Total Output 600    Net -120             Physical Exam: General: NAD.  Upright in bed.  Calm, conversant.  No increased work of breathing. MSK LLE Neurovascularly intact Sensation intact distally Feet warm Knee flexion, ankle dorsiflexion/Plantar flexion intact Incision: dressing C/D/I   Assessment: 2 Days Post-Op  S/P Procedure(s) (LRB): ARTHROPLASTY BIPOLAR HIP (HEMIARTHROPLASTY) (Left) by Dr. 2/18 on 04/22/2019  Principal Problem:   Femoral neck fracture (HCC) Active Problems:   Hypertension   Diabetes (HCC)   Hyponatremia   Anemia   Closed left femoral neck fracture, status post hemiarthroplasty Doing well postop day 2 Tolerating diet  and voiding Pain controlled Early mobilization.  SNF recommended by therapy-previously ambulatory with a walker or cane  Plan: Up with therapy Incentive Spirometry Apply ice PRN Vitamin D  Weightbearing: WBAT LLE.  Posterior hip precautions Insicional and dressing care: Mepilex as needed.  Changed today. Showering: Keep dressing dry VTE prophylaxis: Lovenox, SCDs, ambulation Pain control: Minimize narcotics.  Continue current regimen. Follow - up plan: 2 weeks  Dispo: TBD.  Therapy evaluations ongoing.  Discharge when mobilized and ready medically.    Lives alone.  Son lives a few houses away.  Ambulatory with walker or cane.   04/24/2019 Martensen III, PA-C 04/24/2019, 7:05 AM

## 2019-04-24 NOTE — NC FL2 (Addendum)
[place] MEDICAID FL2 LEVEL OF CARE SCREENING TOOL     IDENTIFICATION  Patient Name: Nathan Alvarez Birthdate: 1924-06-28 Sex: male Admission Date (Current Location): 04/20/2019  [facility] and [place] Number:  Producer, television/film/video and Address:  [facility] [place]. [facility], [address] [place], [place] [address]      Provider Number: [id number]  Attending Physician Name and Address:  Jackie Plum, MD  Relative Name and Phone Number:  Hilda Lias [phone]    Current Level of Care: Hospital Recommended Level of Care: Skilled Nursing Facility Prior Approval Number:    Date Approved/Denied:   PASRR Number: [id number]  Discharge Plan: SNF    Current Diagnoses: Patient Active Problem List   Diagnosis Date Noted  . Femoral neck fracture (HCC) 04/21/2019  . Hypertension 04/21/2019  . Diabetes (HCC) 04/21/2019  . Hyponatremia 04/21/2019  . Anemia 04/21/2019    Orientation RESPIRATION BLADDER Height & Weight     Self, Situation, Time, Place  Normal Continent Weight: 140 lb 14 oz (63.9 kg) Height:  5\' 6"  (167.6 cm)  BEHAVIORAL SYMPTOMS/MOOD NEUROLOGICAL BOWEL NUTRITION STATUS      Continent (see discharge summary)  AMBULATORY STATUS COMMUNICATION OF NEEDS Skin   Extensive Assist Verbally Surgical wounds(ecchymosis)                       Personal Care Assistance Level of Assistance  Bathing, Feeding, Dressing Bathing Assistance: Limited assistance Feeding assistance: Independent Dressing Assistance: Limited assistance     Functional Limitations Info  Sight, Hearing, Speech Sight Info: Impaired Hearing Info: Impaired Speech Info: Adequate    SPECIAL CARE FACTORS FREQUENCY  PT (By licensed PT), OT (By licensed OT)     PT Frequency: 5x per week OT Frequency: 5x per week            Contractures Contractures Info: Not present    Additional Factors Info  Code Status, Allergies Code Status Info: Full Allergies Info: Penicillins           Current Medications (04/24/2019):  This is the current hospital active medication list Current Facility-Administered Medications  Medication Dose Route Frequency Provider Last Rate Last Admin  . acetaminophen (TYLENOL) tablet 500 mg  500 mg Oral Q8H 04/26/2019, PA-C   500 mg at 04/24/19 04/26/19  . ascorbic acid (VITAMIN C) tablet 500 mg  500 mg Oral Daily 3536, PA-C   500 mg at 04/24/19 0901  . aspirin EC tablet 81 mg  81 mg Oral Daily 04/26/19, PA-C   81 mg at 04/24/19 0901  . calcium carbonate (OS-CAL - dosed in mg of elemental calcium) tablet 500 mg of elemental calcium  1 tablet Oral BID WC 04/26/19, MD   500 mg of elemental calcium at 04/24/19 0901  . carvedilol (COREG) tablet 6.25 mg  6.25 mg Oral BID 04/26/19, PA-C   6.25 mg at 04/24/19 0901  . Chlorhexidine Gluconate Cloth 2 % PADS 6 each  6 each Topical Daily 04/26/19, MD   6 each at 04/24/19 0903  . cholecalciferol (VITAMIN D3) tablet 2,000 Units  2,000 Units Oral BID 04/26/19, PA-C   2,000 Units at 04/24/19 0901  . enoxaparin (LOVENOX) injection 40 mg  40 mg Subcutaneous Q24H 04/26/19, PA-C   40 mg at 04/24/19 0901  . HYDROcodone-acetaminophen (NORCO) 7.5-325 MG per tablet 1-2 tablet  1-2 tablet Oral Q4H PRN 08-[birth year]-1976, PA-C      .  HYDROcodone-acetaminophen (NORCO/VICODIN) 5-325 MG per tablet 1-2 tablet  1-2 tablet Oral Q4H PRN Ainsley Spinner, PA-C      . lactated ringers infusion   Intravenous Continuous Ainsley Spinner, PA-C 10 mL/hr at 04/22/19 0851 New Bag at 04/22/19 1143  . menthol-cetylpyridinium (CEPACOL) lozenge 3 mg  1 lozenge Oral PRN Ainsley Spinner, PA-C       Or  . phenol (CHLORASEPTIC) mouth spray 1 spray  1 spray Mouth/Throat PRN Ainsley Spinner, PA-C      . morphine 2 MG/ML injection 0.5-1 mg  0.5-1 mg Intravenous Q2H PRN Ainsley Spinner, PA-C      . ondansetron [facility]) tablet 4 mg  4 mg Oral Q6H PRN Ainsley Spinner, PA-C       Or  . ondansetron [facility]) injection 4 mg  4 mg Intravenous Q6H PRN  Ainsley Spinner, PA-C      . polyethylene glycol (MIRALAX / GLYCOLAX) packet 17 g  17 g Oral BID Martyn Malay, MD   17 g at 04/24/19 0901  . senna (SENOKOT) tablet 8.6 mg  1 tablet Oral Daily PRN Martyn Malay, MD      . tamsulosin [facility]) capsule 0.4 mg  0.4 mg Oral QPC supper Martyn Malay, MD   0.4 mg at 04/23/19 1700     Discharge Medications: Please see discharge summary for a list of discharge medications.  Relevant Imaging Results:  Relevant Lab Results:   Additional Information ss# [place], [place]

## 2019-04-24 NOTE — Progress Notes (Signed)
PROGRESS NOTE    Nathan Alvarez  DZH:[id number] DOB: 1924/07/06 DOA: 04/20/2019 PCP: Curly Rim, MD   Brief Narrative:    86 year old gentleman with history of sick sinus syndrome status post pacemaker placement, hypertension and diabetes who presented with a left femoral neck fracture.  He underwent left hip arthroplasty on January 15.  Hospital course has been complicated by hyponatremia which is improving.    Assessment & Plan:   Principal Problem:   Femoral neck fracture (HCC) Active Problems:   Hypertension   Diabetes ([place])   Hyponatremia   Anemia  Left femoral neck fracture, status post arthroplasty on 1/15. -Orthopedics consult-S/P Procedure(s) (LRB): ARTHROPLASTY BIPOLAR HIP (HEMIARTHROPLASTY) (Left) by Dr. Marcelino Scot on 04/22/2019 -Vitamin D is low, on calcium and vitamin D. -Consider bisphosphonate therapy in 6 to 8 weeks -PT consulted, recommends SNF - consulted for  transitions of care.  History of atrial fibrillation, status post pacemaker history of placement. -On aspirin therapy.  Continue carvedilol.  Hyponatremia,Resolved 04/24/2019, likely due to to dehydration as well as medication (HCTZ) - Restart HCTZ as tolerated (likely 1/18)   Type 2 diabetes, meets criteria for diabetes by fasting blood sugars -Continue low-dose lispro.  Acute blood loss anemia, normocytic.  Hemoglobin stable continue to monitor.  BPH - Restart Flomax  - Bladder scans qshift  DVT prophylaxis: Enoxaparin   Code Status: FULL  Family Communication: Discussed with patient  Disposition Plan: SNF , TOC consulted     Consultants:   Orthopedics   Procedures:   Left hip arthroplasty 1/15  Antimicrobials:   None     Subjective: Feeling better, pain controlled. No fveer or chills  Objective: Vitals:   04/23/19 2130 04/24/19 0450 04/24/19 0600 04/24/19 0733  BP:  139/63  (!) 149/57  Pulse:  64  62  Resp: 18     Temp:  98.4 F (36.9 C)  97.7 F (36.5 C)   TempSrc:  Oral  Oral  SpO2:  93%  96%  Weight:   63.9 kg   Height:        Intake/Output Summary (Last 24 hours) at 04/24/2019 1318 Last data filed at 04/24/2019 1100 Gross per 24 hour  Intake 360 ml  Output 600 ml  Net -240 ml   Filed Weights   04/20/19 2235 04/23/19 0500 04/24/19 0600  Weight: 61.7 kg 66.9 kg 63.9 kg    Examination:  General exam: Appears calm and comfortable  Respiratory system: Clear to auscultation. Respiratory effort normal. Cardiovascular system: RRR.  Gastrointestinal system: Abdomen is  soft and nontender. No organomegaly or masses felt. Normal bowel sounds heard. Central nervous system: Alert and oriented.  Skin: No rashes, lesions or ulcers Psychiatry: Judgement and insight appear normal.  Data Reviewed: I have personally reviewed following labs and imaging studies  CBC: Recent Labs  Lab 04/20/19 2303 04/21/19 0415 04/22/19 0327 04/23/19 0348 04/24/19 0645  WBC 11.6* 18.4* 10.8* 9.9 10.8*  NEUTROABS 9.4*  --   --   --   --   HGB 11.5* 11.5* 10.8* 10.5* 10.1*  HCT 33.7* 34.9* 31.5* 29.8* 29.0*  MCV 86.6 87.3 85.6 84.9 85.0  PLT 314 296 280 249 419   Basic Metabolic Panel: Recent Labs  Lab 04/20/19 2303 04/21/19 0415 04/22/19 0327 04/23/19 0348 04/24/19 0645  NA 123* 125* 130* 132* 135  K 4.2 4.2 3.8 3.9 3.9  CL 91* 90* 98 100 104  CO2 24 26 22 23 23   GLUCOSE 157* 173* 126* 129* 141*  BUN  38* 39* 33* 35* 36*  CREATININE 1.07 1.02 1.07 1.13 0.89  CALCIUM 8.7* 8.8* 8.4* 8.2* 8.1*     Radiology Studies: DG Hip Port Unilat With Pelvis 1V Left  Result Date: 04/22/2019 CLINICAL DATA:  Post arthroplasty EXAM: DG HIP (WITH OR WITHOUT PELVIS) 1V PORT LEFT COMPARISON:  None. FINDINGS: Interval unipolar LEFT hip arthroplasty for surgical repair of femoral neck fracture. Postsurgical change in the soft tissues. No dislocation or fracture IMPRESSION: No complication following unipolar hip arthroplasty Electronically Signed   By: Genevive Bi M.D.   On: 04/22/2019 14:16        Scheduled Meds: . acetaminophen  500 mg Oral Q8H  . vitamin C  500 mg Oral Daily  . aspirin EC  81 mg Oral Daily  . calcium carbonate  1 tablet Oral BID WC  . carvedilol  6.25 mg Oral BID  . Chlorhexidine Gluconate Cloth  6 each Topical Daily  . cholecalciferol  2,000 Units Oral BID  . enoxaparin (LOVENOX) injection  40 mg Subcutaneous Q24H  . polyethylene glycol  17 g Oral BID  . tamsulosin  0.4 mg Oral QPC supper   Continuous Infusions: . lactated ringers 10 mL/hr at 04/22/19 0851     LOS: 3 days    Time spent: 25 minutes   Jackie Plum, MD   [facility] Pager [phone]  If 7PM-7AM, please contact night-coverage [email or web address] Password [email or web address] 04/24/2019, 1:18 PM

## 2019-04-24 NOTE — Plan of Care (Signed)
  Problem: Pain Managment: Goal: General experience of comfort will improve Outcome: Progressing   Problem: Safety: Goal: Ability to remain free from injury will improve Outcome: Progressing   Problem: Skin Integrity: Goal: Risk for impaired skin integrity will decrease Outcome: Progressing   

## 2019-04-24 NOTE — TOC Initial Note (Signed)
Transition of Care (TOC) - Initial/Assessment Note    Patient Details  Name: Nathan Alvarez MRN: [id number] Date of Birth: 09/02/1924  Transition of Care (TOC) CM/SW Contact:    Lisa E Bracken, LCSW Phone Number: [phone] 04/24/2019, 12:27 PM  Clinical Narrative:                  CSW met with patient to discuss PT recommendation of a SNF. Patient was aware of recommendation and in agreement with going to a ST SNF. CSW discussed the SNF process.CSW provided patient with medicare.gov rating list.  Patient gave CSW permission to fax referrals out to local facilities.CSW answered questions about the SNF process and the next steps in the process. Patient asked CSW to follow up with daughter in law Marie. Patient asked that Marie assist with the SNF decision.  CSW spoke with Marie and she was in agreement also with SNF recommendation. CSW reviewed and discussed SNF patient's home. CSW also reviewed ratings. CSW and Marie discussed the facilities that Marie would like referral faxed out to.   TOC will continue to follow for discharge planning needs.     Expected Discharge Plan: Skilled Nursing Facility Barriers to Discharge: Continued Medical Work up, SNF Pending bed offer   Patient Goals and CMS Choice Patient states their goals for this hospitalization and ongoing recovery are:: To get better CMS Medicare.gov Compare Post Acute Care list provided to:: Patient    Expected Discharge Plan and Services Expected Discharge Plan: Skilled Nursing Facility                                              Prior Living Arrangements/Services   Lives with:: Self Patient language and need for interpreter reviewed:: Yes Do you feel safe going back to the place where you live?: Yes      Need for Family Participation in Patient Care: Yes (Comment) Care giver support system in place?: Yes (comment)      Activities of Daily Living      Permission Sought/Granted   Permission  granted to share information with : Yes, Verbal Permission Granted  Share Information with NAME: Marie  Permission granted to share info w AGENCY: SNFs  Permission granted to share info w Relationship: daughter in law  Permission granted to share info w Contact Information: [phone]  Emotional Assessment Appearance:: Appears stated age Attitude/Demeanor/Rapport: Engaged Affect (typically observed): Adaptable, Accepting Orientation: : Oriented to Self, Oriented to Place, Oriented to  Time, Oriented to Situation      Admission diagnosis:  Hip fracture (HCC) [S72.009A] Closed fracture of left hip, initial encounter (HCC) [S72.002A] Patient Active Problem List   Diagnosis Date Noted  . Femoral neck fracture (HCC) 04/21/2019  . Hypertension 04/21/2019  . Diabetes (HCC) 04/21/2019  . Hyponatremia 04/21/2019  . Anemia 04/21/2019   PCP:  Corrington, Kip A, MD Pharmacy:   [id number] - [place], [place] - [address] [place] [address] Phone: [phone] Fax: [phone]     Social Determinants of Health (SDOH) Interventions    Readmission Risk Interventions No flowsheet data found.  

## 2019-04-25 ENCOUNTER — Encounter (HOSPITAL_COMMUNITY): Payer: Self-pay | Admitting: Internal Medicine

## 2019-04-25 DIAGNOSIS — M80052A Age-related osteoporosis with current pathological fracture, left femur, initial encounter for fracture: Secondary | ICD-10-CM | POA: Diagnosis present

## 2019-04-25 DIAGNOSIS — E559 Vitamin D deficiency, unspecified: Secondary | ICD-10-CM | POA: Diagnosis present

## 2019-04-25 HISTORY — DX: Age-related osteoporosis with current pathological fracture, left femur, initial encounter for fracture: M80.052A

## 2019-04-25 LAB — CBC
HCT: 28.1 % — ABNORMAL LOW (ref 39.0–52.0)
Hemoglobin: 9.4 g/dL — ABNORMAL LOW (ref 13.0–17.0)
MCH: 28.9 pg (ref 26.0–34.0)
MCHC: 33.5 g/dL (ref 30.0–36.0)
MCV: 86.5 fL (ref 80.0–100.0)
Platelets: 256 10*3/uL (ref 150–400)
RBC: 3.25 MIL/uL — ABNORMAL LOW (ref 4.22–5.81)
RDW: 14.9 % (ref 11.5–15.5)
WBC: 9.7 10*3/uL (ref 4.0–10.5)
nRBC: 0 % (ref 0.0–0.2)

## 2019-04-25 LAB — BASIC METABOLIC PANEL
Anion gap: 9 (ref 5–15)
BUN: 30 mg/dL — ABNORMAL HIGH (ref 8–23)
CO2: 25 mmol/L (ref 22–32)
Calcium: 8.2 mg/dL — ABNORMAL LOW (ref 8.9–10.3)
Chloride: 101 mmol/L (ref 98–111)
Creatinine, Ser: 0.94 mg/dL (ref 0.61–1.24)
GFR calc Af Amer: >60 mL/min (ref >60)
GFR calc non Af Amer: >60 mL/min (ref >60)
Glucose, Bld: 139 mg/dL — ABNORMAL HIGH (ref 70–99)
Potassium: 4.1 mmol/L (ref 3.5–5.1)
Sodium: 135 mmol/L (ref 135–145)

## 2019-04-25 LAB — GLUCOSE, CAPILLARY
Glucose-Capillary: 187 mg/dL — ABNORMAL HIGH (ref 70–99)
Glucose-Capillary: 120 mg/dL — ABNORMAL HIGH (ref 70–99)
Glucose-Capillary: 129 mg/dL — ABNORMAL HIGH (ref 70–99)

## 2019-04-25 MED ORDER — CALCIUM CARBONATE 1250 (500 CA) MG PO TABS: 1.0000 | ORAL_TABLET | Freq: Two times a day (BID) | ORAL | 0 refills | Status: DC

## 2019-04-25 MED ORDER — VITAMIN D 125 MCG (5000 UT) PO CAPS: 1.0000 | ORAL_CAPSULE | Freq: Every day | ORAL | 3 refills | Status: DC

## 2019-04-25 MED ORDER — GLUCERNA SHAKE PO LIQD
237.0000 mL | Freq: Two times a day (BID) | ORAL | Status: DC
  Administered 2019-04-25 – 2019-04-27 (×4): 237 mL via ORAL

## 2019-04-25 MED ORDER — ACETAMINOPHEN 500 MG PO TABS: 500.0000 mg | ORAL_TABLET | Freq: Three times a day (TID) | ORAL | 0 refills | Status: DC

## 2019-04-25 MED ORDER — INSULIN ASPART 100 UNIT/ML ~~LOC~~ SOLN: 0.0000 [IU] | Freq: Every day | SUBCUTANEOUS | Status: DC

## 2019-04-25 MED ORDER — ASCORBIC ACID 500 MG PO TABS: 500.0000 mg | ORAL_TABLET | Freq: Every day | ORAL | Status: DC

## 2019-04-25 MED ORDER — ENOXAPARIN SODIUM 40 MG/0.4ML ~~LOC~~ SOLN: 40.0000 mg | SUBCUTANEOUS | 0 refills | Status: DC

## 2019-04-25 MED ORDER — ADULT MULTIVITAMIN W/MINERALS CH
1.0000 | ORAL_TABLET | Freq: Every day | ORAL | Status: DC
  Administered 2019-04-25 – 2019-04-27 (×3): 1 via ORAL
  Filled 2019-04-25 (×3): qty 1

## 2019-04-25 MED ORDER — INSULIN ASPART 100 UNIT/ML ~~LOC~~ SOLN
0.0000 [IU] | Freq: Three times a day (TID) | SUBCUTANEOUS | Status: DC
  Administered 2019-04-25 – 2019-04-26 (×2): 1 [IU] via SUBCUTANEOUS

## 2019-04-25 NOTE — TOC Progression Note (Signed)
Transition of Care [facility]) - Progression Note    Patient Details  Name: Nathan Alvarez MRN: [id number] Date of Birth: [birth year]  Transition of [facility]) CM/SW Contact  Epifanio Lesches, RN Phone Number: [phone] 04/25/2019, 2:06 PM  Clinical Narrative:    Awaiting SNF bed offers ... TOC team will continue to monitor.   Expected Discharge Plan: Skilled Nursing Facility Barriers to Discharge: SNF Pending bed offer  Expected Discharge Plan and Services Expected Discharge Plan: Skilled Nursing Facility                                               Social Determinants of Health (SDOH) Interventions    Readmission Risk Interventions No flowsheet data found.

## 2019-04-25 NOTE — Progress Notes (Addendum)
Orthopaedic Trauma Service Progress Note  Patient ID: Nathan Alvarez MRN: [id number] DOB/AGE: 84-Jun-1926 84 y.o.  Subjective:  Doing very well Mild sorness L hip  No acute issues Bored lying in bed  No other complaints    ROS As above  Objective:   VITALS:   Vitals:   04/24/19 1927 04/25/19 0431 04/25/19 0600 04/25/19 0819  BP: (!) 152/71 (!) 163/65  (!) 159/69  Pulse: 69 (!) 57  (!) 58  Resp:    18  Temp: 97.9 F (36.6 C) 98.7 F (37.1 C)  98 F (36.7 C)  TempSrc: Oral Oral  Oral  SpO2: 97% 96%  97%  Weight:   62.4 kg   Height:        Estimated body mass index is 22.2 kg/m as calculated from the following:   Height as of this encounter: 5\' 6"  (1.676 m).   Weight as of this encounter: 62.4 kg.   Intake/Output      01/17 0701 - 01/18 0700 01/18 0701 - 01/19 0700   P.O. 360 240   I.V. (mL/kg) 0 (0)    Total Intake(mL/kg) 360 (5.8) 240 (3.8)   Urine (mL/kg/hr)     Total Output     Net +360 +240        Urine Occurrence 2 x    Stool Occurrence 1 x      LABS  Results for orders placed or performed during the hospital encounter of 04/20/19 (from the past 24 hour(s))  CBC     Status: Abnormal   Collection Time: 04/25/19  3:56 AM  Result Value Ref Range   WBC 9.7 4.0 - 10.5 K/uL   RBC 3.25 (L) 4.22 - 5.81 MIL/uL   Hemoglobin 9.4 (L) 13.0 - 17.0 g/dL   HCT 04/27/19 (L) 65.7 - 84.6 %   MCV 86.5 80.0 - 100.0 fL   MCH 28.9 26.0 - 34.0 pg   MCHC 33.5 30.0 - 36.0 g/dL   RDW 96.2 95.2 - 84.1 %   Platelets 256 150 - 400 K/uL   nRBC 0.0 0.0 - 0.2 %  Basic metabolic panel     Status: Abnormal   Collection Time: 04/25/19  3:56 AM  Result Value Ref Range   Sodium 135 135 - 145 mmol/L   Potassium 4.1 3.5 - 5.1 mmol/L   Chloride 101 98 - 111 mmol/L   CO2 25 22 - 32 mmol/L   Glucose, Bld 139 (H) 70 - 99 mg/dL   BUN 30 (H) 8 - 23 mg/dL   Creatinine, Ser 04/27/19 0.61 - 1.24 mg/dL   Calcium 8.2  (L) 8.9 - 10.3 mg/dL   GFR calc non Af Amer >60 >60 mL/min   GFR calc Af Amer >60 >60 mL/min   Anion gap 9 5 - 15     PHYSICAL EXAM:   Gen: resting comfortably in bed, NAD, appears well  Lungs: unlabored, anterior fields are clear Cardiac: irregularly irregular  Abd: + BS,NTND Ext:       Left Lower Extremity   Dressing c/d/i   Incision stable, dry    No erythema   Ext warm   + DP pulse  DPN, SPN, TN sensation intact  EHL, FHL, AT, PT, peroneals, gastroc motor intact  + quad set  No DCT   Compartments  are soft   No pain with passive stretching   Assessment/Plan: 3 Days Post-Op   Principal Problem:   Femoral neck fracture (HCC) Active Problems:   Hypertension   Diabetes ([place])   Hyponatremia   Anemia   Vitamin D deficiency   Pathological fracture of left hip due to age-related osteoporosis ([place])   Anti-infectives (From admission, onward)   Start     Dose/Rate Route Frequency Ordered Stop   04/22/19 1430  ceFAZolin (ANCEF) IVPB 2g/100 mL premix     2 g 200 mL/hr over 30 Minutes Intravenous Every 6 hours 04/22/19 1429 04/22/19 2224    .  POD/HD#: 30  86 y/o male s/p fall with L femoral neck fracture s/p left hip hemiarthroplasty, walker dependent at baseline  -L femoral neck fracture s/p L hip hemiarthroplasty   WBAT L leg with walker  Posterior hip precautions L hip  PT/OT  Dressing changes as needed   Ok to leave dressing off if no drainage   Ok to clean wound with soap and water   Ice prn    Given history of esophagitis would favor prolia instead of bisphosphonates for treatment of osteoporosis.  However would recommend DEXA before deciding which agent to use.  If pt has very low T-score would recommend anabolic agent.  If bisphosphonates used would need to correct for vitamin d deficiency.  I would also not consider bisphosphonates in this particular patient because of his A. fib.  Bisphosphonate use has been associated with exacerbation or even the  induction of A. fib in patients.  Will discuss with patient at post op follow up   - Pain management:  Appears adequate with tylenol   - ABL anemia/Hemodynamics  Stable   - Medical issues   Per primary   - DVT/PE prophylaxis:  Lovenox x 30 days post op   - ID:   periop abx completed   - Metabolic Bone Disease:  + vitamin d deficiency    Supplement 4000-5000 IU vitamin D 3 daily    Vitamin c    Adequate nutrition   DEXA as outpt   - Activity:  Up with therapy/assistance  - FEN/GI prophylaxis/Foley/Lines:  Reg diet  RD consult   - Impediments to fracture healing:  Osteoporosis   Decreased activity level   Falls   - Dispo:  Ortho issues stable  Follow up with ortho in 10-14 days for suture removal and follow up xrays    Weightbearing: WBAT LLE Insicional and dressing care: OK to remove dressings as of now and leave open to air with dry gauze PRN Orthopedic device(s): walker Showering: ok to shower and clean wounds with soap and water only VTE prophylaxis: Lovenox 40mg  qd 30 days Pain control: tylenol  Follow - up plan: 10-14 days Contact information:  Altamese Rachel MD, Ainsley Spinner PA-C   Jari Pigg, PA-C [phone] (C) 04/25/2019, 9:41 AM  [facility] [place] [address] [phone] Jenetta [phone] (F)   After 6pm on weekdays please call office number to get in touch with on call provider or refer to Amion and look to see who is on call for the Sports Medicine Call Group which is listed under orthopaedics   On Weekends please call office number to get in touch with on call provider or refer to Amion and look to see who is on call for the Sports Medicine Call Group which is listed under orthopaedics

## 2019-04-25 NOTE — Discharge Instructions (Signed)
Orthopaedic Trauma Service Discharge Instructions   General Discharge Instructions  Orthopaedic Injuries:  Left femoral neck fracture treated with left hip hemiarthroplasty  WEIGHT BEARING STATUS: Weight-bear as tolerated left leg with use of walker  RANGE OF MOTION/ACTIVITY: Posterior hip precautions left hip otherwise activity as tolerated  Bone health: Labs do show that you have pretty significant vitamin D deficiency.  Continue to take vitamin D, vitamin C and calcium.  Femoral neck fracture is also indicative of osteoporosis.  We recommend outpatient bone density scan in the next 4 to 8 weeks.  He would also likely benefit from additional treatment of your poor bone quality.  This can be discussed in the outpatient setting  Wound Care: Daily wound care starting effective 04/25/2019.  Please see below  Discharge Wound Care Instructions  Do NOT apply any ointments, solutions or lotions to pin sites or surgical wounds.  These prevent needed drainage and even though solutions like hydrogen peroxide kill bacteria, they also damage cells lining the pin sites that help fight infection.  Applying lotions or ointments can keep the wounds moist and can cause them to breakdown and open up as well. This can increase the risk for infection. When in doubt call the office.  Surgical incisions should be dressed daily.  If any drainage is noted, use one layer of adaptic, then gauze and tape.  Alternatively you can use a Mepilex type dressing  Once the incision is completely dry and without drainage, it may be left open to air out.  Showering may begin 36-48 hours later.  Cleaning gently with soap and water.   DVT/PE prophylaxis: Lovenox 40 mg subcutaneous injection daily for another 28 days  Diet: as you were eating previously.  Can use over the counter stool softeners and bowel preparations, such as Miralax, to help with bowel movements.  Narcotics can be constipating.  Be sure to drink plenty  of fluids  PAIN MEDICATION USE AND EXPECTATIONS  You have likely been given narcotic medications to help control your pain.  After a traumatic event that results in an fracture (broken bone) with or without surgery, it is ok to use narcotic pain medications to help control one's pain.  We understand that everyone responds to pain differently and each individual patient will be evaluated on a regular basis for the continued need for narcotic medications. Ideally, narcotic medication use should last no more than 6-8 weeks (coinciding with fracture healing).   As a patient it is your responsibility as well to monitor narcotic medication use and report the amount and frequency you use these medications when you come to your office visit.   We would also advise that if you are using narcotic medications, you should take a dose prior to therapy to maximize you participation.  IF YOU ARE ON NARCOTIC MEDICATIONS IT IS NOT PERMISSIBLE TO OPERATE A MOTOR VEHICLE (MOTORCYCLE/CAR/TRUCK/MOPED) OR HEAVY MACHINERY DO NOT MIX NARCOTICS WITH OTHER CNS (CENTRAL NERVOUS SYSTEM) DEPRESSANTS SUCH AS ALCOHOL   STOP SMOKING OR USING NICOTINE PRODUCTS!!!!  As discussed nicotine severely impairs your body's ability to heal surgical and traumatic wounds but also impairs bone healing.  Wounds and bone heal by forming microscopic blood vessels (angiogenesis) and nicotine is a vasoconstrictor (essentially, shrinks blood vessels).  Therefore, if vasoconstriction occurs to these microscopic blood vessels they essentially disappear and are unable to deliver necessary nutrients to the healing tissue.  This is one modifiable factor that you can do to dramatically increase your chances of  healing your injury.    (This means no smoking, no nicotine gum, patches, etc)  DO NOT USE NONSTEROIDAL ANTI-INFLAMMATORY DRUGS (NSAID'S)  Using products such as Advil (ibuprofen), Aleve (naproxen), Motrin (ibuprofen) for additional pain control  during fracture healing can delay and/or prevent the healing response.  If you would like to take over the counter (OTC) medication, Tylenol (acetaminophen) is ok.  However, some narcotic medications that are given for pain control contain acetaminophen as well. Therefore, you should not exceed more than 4000 mg of tylenol in a day if you do not have liver disease.  Also note that there are may OTC medicines, such as cold medicines and allergy medicines that my contain tylenol as well.  If you have any questions about medications and/or interactions please ask your doctor/PA or your pharmacist.      ICE AND ELEVATE INJURED/OPERATIVE EXTREMITY  Using ice and elevating the injured extremity above your heart can help with swelling and pain control.  Icing in a pulsatile fashion, such as 20 minutes on and 20 minutes off, can be followed.    Do not place ice directly on skin. Make sure there is a barrier between to skin and the ice pack.    Using frozen items such as frozen peas works well as the conform nicely to the are that needs to be iced.  USE AN ACE WRAP OR TED HOSE FOR SWELLING CONTROL  In addition to icing and elevation, Ace wraps or TED hose are used to help limit and resolve swelling.  It is recommended to use Ace wraps or TED hose until you are informed to stop.    When using Ace Wraps start the wrapping distally (farthest away from the body) and wrap proximally (closer to the body)   Example: If you had surgery on your leg or thing and you do not have a splint on, start the ace wrap at the toes and work your way up to the thigh        If you had surgery on your upper extremity and do not have a splint on, start the ace wrap at your fingers and work your way up to the upper arm  IF YOU ARE IN A SPLINT OR CAST DO NOT REMOVE IT FOR ANY REASON   If your splint gets wet for any reason please contact the office immediately. You may shower in your splint or cast as long as you keep it dry.  This can  be done by wrapping in a cast cover or garbage back (or similar)  Do Not stick any thing down your splint or cast such as pencils, money, or hangers to try and scratch yourself with.  If you feel itchy take benadryl as prescribed on the bottle for itching  IF YOU ARE IN A CAM BOOT (BLACK BOOT)  You may remove boot periodically. Perform daily dressing changes as noted below.  Wash the liner of the boot regularly and wear a sock when wearing the boot. It is recommended that you sleep in the boot until told otherwise    Call office for the following:  Temperature greater than 101F  Persistent nausea and vomiting  Severe uncontrolled pain  Redness, tenderness, or signs of infection (pain, swelling, redness, odor or green/yellow discharge around the site)  Difficulty breathing, headache or visual disturbances  Hives  Persistent dizziness or light-headedness  Extreme fatigue  Any other questions or concerns you may have after discharge  In an emergency, call  911 or go to an Emergency Department at a nearby hospital    CALL THE OFFICE WITH ANY QUESTIONS OR CONCERNS: [phone]   VISIT OUR WEBSITE FOR ADDITIONAL INFORMATION: [email or web address]

## 2019-04-25 NOTE — Plan of Care (Signed)
  Problem: Pain Managment: Goal: General experience of comfort will improve Outcome: Progressing   Problem: Safety: Goal: Ability to remain free from injury will improve Outcome: Progressing   Problem: Skin Integrity: Goal: Risk for impaired skin integrity will decrease Outcome: Progressing   

## 2019-04-25 NOTE — Progress Notes (Signed)
Physical Therapy Treatment Patient Details Name: Nathan Alvarez MRN: [id number] DOB: [birth year] Today's Date: 04/25/2019    History of Present Illness Pt is a 86 y/o male admitted after falling at home in which he sustained a displaced L femoral neck fracture, now s/p arthroplasty bipolar hip (L). PMH including but not limited to a-fib, DM, HTN and pacemaker.    PT Comments    Continuing work on functional mobility and activity tolerance;  Excellent progress with gait distance and pattern, activity tolerance, and therapeutic exercise; He seemed pleased with the work we did this session; Overall progressing well; Anticipate continuing good progress at post-acute rehabilitation.    Follow Up Recommendations  SNF     Equipment Recommendations  None recommended by PT    Recommendations for Other Services       Precautions / Restrictions Precautions Precautions: Fall;Posterior Hip Precaution Booklet Issued: Yes (comment) Precaution Comments: Recalled 2/3 Precautions with min cues to remember "no crossing legs" Restrictions LLE Weight Bearing: Weight bearing as tolerated    Mobility  Bed Mobility Overal bed mobility: Needs Assistance Bed Mobility: Supine to Sit     Supine to sit: Min assist     General bed mobility comments: increased time and effort, cueing for sequencing  Transfers Overall transfer level: Needs assistance Equipment used: Rolling walker (2 wheeled) Transfers: Sit to/from Stand Sit to Stand: Min assist;From elevated surface         General transfer comment: cueing for safe hand placement, min A to power into standing from EOB and for stability with transitional movement  Ambulation/Gait Ambulation/Gait assistance: Min assist;Min guard Gait Distance (Feet): 150 Feet Assistive device: Rolling walker (2 wheeled) Gait Pattern/deviations: Step-through pattern(emerging) Gait velocity: decreased   General Gait Details: Cues for sequence, posture, and to  take small steps to avoid hip internal rotation with turns   Stairs             Wheelchair Mobility    Modified Rankin (Stroke Patients Only)       Balance     Sitting balance-Leahy Scale: Fair       Standing balance-Leahy Scale: Poor(approaching Fair) Standing balance comment: reliant on UEs on RW                            Cognition Arousal/Alertness: Awake/alert Behavior During Therapy: WFL for tasks assessed/performed Overall Cognitive Status: Within Functional Limits for tasks assessed(for simple mobility tasks)                                        Exercises Total Joint Exercises Ankle Circles/Pumps: AROM;Both;20 reps Quad Sets: AROM;Both;10 reps Towel Squeeze: AROM;Both;10 reps;Other (comment)(with close monitor to avoid interanl rotation) Short Arc Quad: AROM;Both;10 reps(alternating) Heel Slides: AAROM;Left;10 reps Hip ABduction/ADduction: AAROM;Left;10 reps    General Comments        Pertinent Vitals/Pain Pain Assessment: Faces Faces Pain Scale: Hurts little more Pain Location: L hip Pain Descriptors / Indicators: Grimacing;Guarding Pain Intervention(s): Monitored during session    Home Living                      Prior Function            PT Goals (current goals can now be found in the care plan section) Acute Rehab PT Goals Patient Stated Goal: to go to rehab and  then home PT Goal Formulation: With patient Time For Goal Achievement: 05/07/19 Potential to Achieve Goals: Good Progress towards PT goals: Progressing toward goals    Frequency    Min 3X/week      PT Plan Current plan remains appropriate    Co-evaluation              AM-PAC PT "6 Clicks" Mobility   Outcome Measure  Help needed turning from your back to your side while in a flat bed without using bedrails?: A Lot Help needed moving from lying on your back to sitting on the side of a flat bed without using bedrails?: A  Little Help needed moving to and from a bed to a chair (including a wheelchair)?: A Little Help needed standing up from a chair using your arms (e.g., wheelchair or bedside chair)?: A Little Help needed to walk in hospital room?: A Little Help needed climbing 3-5 steps with a railing? : A Lot 6 Click Score: 16    End of Session Equipment Utilized During Treatment: Gait belt Activity Tolerance: Patient tolerated treatment well Patient left: in chair;with call bell/phone within reach Nurse Communication: Mobility status PT Visit Diagnosis: Other abnormalities of gait and mobility (R26.89);Pain Pain - Right/Left: Left Pain - part of body: Hip     Time: 4481-8563 PT Time Calculation (min) (ACUTE ONLY): 26 min  Charges:  $Gait Training: 8-22 mins $Therapeutic Exercise: 8-22 mins                     Van Clines, PT  Acute Rehabilitation Services Pager [phone] Office [phone]    Nathan Alvarez 04/25/2019, 4:10 PM

## 2019-04-25 NOTE — Progress Notes (Signed)
PROGRESS NOTE    Nathan Alvarez  RSW:[id number] DOB: [birth year] DOA: 04/20/2019 PCP: Vivien Presto, MD   Brief Narrative:   86 year old gentleman with history of sick sinus syndrome status post pacemaker placement, hypertension and diabetes who presented with a left femoral neck fracture.  He underwent left hip arthroplasty on January 15.  Hospital course has been complicated by hyponatremia which has resolved.  Assessment & Plan:   Principal Problem:   Femoral neck fracture (HCC) Active Problems:   Hypertension   Diabetes (HCC)   Hyponatremia   Anemia   Vitamin D deficiency   Pathological fracture of left hip due to age-related osteoporosis (HCC)  Left femoral neck fracture, status post arthroplasty on 1/15. -Orthopedics consult-S/P Procedure(s) (LRB): ARTHROPLASTY BIPOLAR HIP (HEMIARTHROPLASTY) (Left) by Dr. Carola Frost on 04/22/2019 -Vitamin D is low, on calcium and vitamin D. -Consider bisphosphonate therapy in 6 to 8 weeks.  Will see orthopedic as outpatient for further discussion. -PT consulted, recommends SNF - consulted for  transitions of care.  No place available as of today.  Potential discharge tomorrow.  History of atrial fibrillation, status post pacemaker history of placement. -On aspirin therapy.  Continue carvedilol.  Hyponatremia,Resolved 04/24/2019, likely due to to dehydration as well as medication (HCTZ) -Continue to hold hydrochlorothiazide.  Essential hypertension: Blood pressure fairly controlled.  Continue carvedilol.  Continue to hold hydrochlorothiazide.  Type 2 diabetes, takes Januvia at home.  That has been on hold.  He is not on any SSI either.  We will start him on SSI.  Hold Januvia.  Anemia of chronic disease with mild acute blood loss anemia: Hemoglobin dropped from 11.5-9.4.  No indication of transfusion.  Likely postop.  Watch daily.  BPH Continue Flomax.  DVT prophylaxis: Enoxaparin   Code Status: FULL  Family Communication: Discussed  with patient  Disposition Plan: SNF , TOC consulted     Consultants:   Orthopedics   Procedures:   Left hip arthroplasty 1/15  Antimicrobials:   None     Subjective: Seen and examined.  Feeling very well.  Walked with physical therapy and he is glad that he did well.  Pain is very well controlled.  No complaints.  Objective: Vitals:   04/24/19 1927 04/25/19 0431 04/25/19 0600 04/25/19 0819  BP: (!) 152/71 (!) 163/65  (!) 159/69  Pulse: 69 (!) 57  (!) 58  Resp:    18  Temp: 97.9 F (36.6 C) 98.7 F (37.1 C)  98 F (36.7 C)  TempSrc: Oral Oral  Oral  SpO2: 97% 96%  97%  Weight:   62.4 kg   Height:        Intake/Output Summary (Last 24 hours) at 04/25/2019 1113 Last data filed at 04/25/2019 0900 Gross per 24 hour  Intake 480 ml  Output --  Net 480 ml   Filed Weights   04/23/19 0500 04/24/19 0600 04/25/19 0600  Weight: 66.9 kg 63.9 kg 62.4 kg    Examination:  General exam: Appears calm and comfortable  Respiratory system: Clear to auscultation. Respiratory effort normal. Cardiovascular system: S1 & S2 heard, RRR. No JVD, murmurs, rubs, gallops or clicks. No pedal edema. Gastrointestinal system: Abdomen is nondistended, soft and nontender. No organomegaly or masses felt. Normal bowel sounds heard. Central nervous system: Alert and oriented. No focal neurological deficits. Skin: No rashes, lesions or ulcers.  Psychiatry: Judgement and insight appear normal. Mood & affect appropriate.   Data Reviewed: I have personally reviewed following labs and imaging studies  CBC: Recent  Labs  Lab 04/20/19 2303 04/20/19 2303 04/21/19 0415 04/22/19 0327 04/23/19 0348 04/24/19 0645 04/25/19 0356  WBC 11.6*   < > 18.4* 10.8* 9.9 10.8* 9.7  NEUTROABS 9.4*  --   --   --   --   --   --   HGB 11.5*   < > 11.5* 10.8* 10.5* 10.1* 9.4*  HCT 33.7*   < > 34.9* 31.5* 29.8* 29.0* 28.1*  MCV 86.6   < > 87.3 85.6 84.9 85.0 86.5  PLT 314   < > 296 280 249 249 256   < > = values  in this interval not displayed.   Basic Metabolic Panel: Recent Labs  Lab 04/21/19 0415 04/22/19 0327 04/23/19 0348 04/24/19 0645 04/25/19 0356  NA 125* 130* 132* 135 135  K 4.2 3.8 3.9 3.9 4.1  CL 90* 98 100 104 101  CO2 26 22 23 23 25   GLUCOSE 173* 126* 129* 141* 139*  BUN 39* 33* 35* 36* 30*  CREATININE 1.02 1.07 1.13 0.89 0.94  CALCIUM 8.8* 8.4* 8.2* 8.1* 8.2*     Radiology Studies: No results found.      Scheduled Meds: . acetaminophen  500 mg Oral Q8H  . vitamin C  500 mg Oral Daily  . aspirin EC  81 mg Oral Daily  . calcium carbonate  1 tablet Oral BID WC  . carvedilol  6.25 mg Oral BID  . Chlorhexidine Gluconate Cloth  6 each Topical Daily  . cholecalciferol  2,000 Units Oral BID  . enoxaparin (LOVENOX) injection  40 mg Subcutaneous Q24H  . polyethylene glycol  17 g Oral BID  . tamsulosin  0.4 mg Oral QPC supper   Continuous Infusions: . lactated ringers 10 mL/hr at 04/22/19 0851     LOS: 4 days   Time spent: 29 minutes  Darliss Cheney, MD  [facility] If 7PM-7AM, please contact night-coverage [email or web address]  04/25/2019, 11:13 AM

## 2019-04-25 NOTE — Progress Notes (Addendum)
Initial Nutrition Assessment  RD working remotely.  DOCUMENTATION CODES:   Not applicable  INTERVENTION:   -MVI with minerals daily -Glucerna Shake po BID, each supplement provides 220 kcal and 10 grams of protein  NUTRITION DIAGNOSIS:   Increased nutrient needs related to post-op healing as evidenced by estimated needs.  GOAL:   Patient will meet greater than or equal to 90% of their needs  MONITOR:   PO intake, Supplement acceptance, Labs, Weight trends, Skin, I & O's  REASON FOR ASSESSMENT:   Consult Hip fracture protocol  ASSESSMENT:   86 year old gentleman with history of sick sinus syndrome status post pacemaker placement, hypertension and diabetes who presented with a left femoral neck fracture.  He underwent left hip arthroplasty on January 15.  Hospital course has been complicated by hyponatremia which has resolved.  Pt admitted with lt femoral neck fracture.   1/15- s/p lt hip hemiarthoplasty  Reviewed I/O's: +360 ml x 24 hours and +780 ml since admission  Attempted to speak with pt x 2, however, message reports user is busy.  Pt with good appetite; noted meal completion 50-100%.   Per wt hx; wt has been stable over the past 3 years.   Medications reviewed and include calcium carbonate, vitamin D3, and miralax.   Plan for SNF at discharge.   Lab Results  Component Value Date   HGBA1C 6.4 (H) 04/21/2019   PTA DM medications are none. Per ADA's Standards of Medical Care for Diabetes, glycemic targets for elderly patients who are otherwise healthy (few medical impairments) and cognitively intact should be less stringent (Hgb A1c <7.5).   Labs reviewed: CBGS: 187 (inpatient orders for glycemic control are 0-5 units insulin aspart q HS and 0-6 units insulin aspart TID with meals).   Diet Order:   Diet Order            Diet regular Room service appropriate? Yes; Fluid consistency: Thin  Diet effective now              EDUCATION NEEDS:   No  education needs have been identified at this time  Skin:  Skin Assessment: Skin Integrity Issues: Skin Integrity Issues:: Incisions Incisions: closed lt hip  Last BM:  04/20/19  Height:   Ht Readings from Last 1 Encounters:  04/20/19 5\' 6"  (1.676 m)    Weight:   Wt Readings from Last 1 Encounters:  04/25/19 62.4 kg    Ideal Body Weight:  64.5 kg  BMI:  Body mass index is 22.2 kg/m.  Estimated Nutritional Needs:   Kcal:  1650-1850  Protein:  75-90 grams  Fluid:  > 1.6 L    Nathan Alvarez A. 04/27/19, RD, LDN, CDCES Registered Dietitian II Certified Diabetes Care and Education Specialist Pager: [phone] After hours Pager: [phone]

## 2019-04-26 LAB — BASIC METABOLIC PANEL
Anion gap: 11 (ref 5–15)
BUN: 26 mg/dL — ABNORMAL HIGH (ref 8–23)
CO2: 24 mmol/L (ref 22–32)
Calcium: 8.3 mg/dL — ABNORMAL LOW (ref 8.9–10.3)
Chloride: 103 mmol/L (ref 98–111)
Creatinine, Ser: 0.83 mg/dL (ref 0.61–1.24)
GFR calc Af Amer: >60 mL/min (ref >60)
GFR calc non Af Amer: >60 mL/min (ref >60)
Glucose, Bld: 113 mg/dL — ABNORMAL HIGH (ref 70–99)
Potassium: 4.6 mmol/L (ref 3.5–5.1)
Sodium: 138 mmol/L (ref 135–145)

## 2019-04-26 LAB — CBC
HCT: 29.5 % — ABNORMAL LOW (ref 39.0–52.0)
Hemoglobin: 9.9 g/dL — ABNORMAL LOW (ref 13.0–17.0)
MCH: 29.1 pg (ref 26.0–34.0)
MCHC: 33.6 g/dL (ref 30.0–36.0)
MCV: 86.8 fL (ref 80.0–100.0)
Platelets: 269 10*3/uL (ref 150–400)
RBC: 3.4 MIL/uL — ABNORMAL LOW (ref 4.22–5.81)
RDW: 15 % (ref 11.5–15.5)
WBC: 8.4 10*3/uL (ref 4.0–10.5)
nRBC: 0 % (ref 0.0–0.2)

## 2019-04-26 LAB — GLUCOSE, CAPILLARY
Glucose-Capillary: 110 mg/dL — ABNORMAL HIGH (ref 70–99)
Glucose-Capillary: 197 mg/dL — ABNORMAL HIGH (ref 70–99)
Glucose-Capillary: 87 mg/dL (ref 70–99)
Glucose-Capillary: 155 mg/dL — ABNORMAL HIGH (ref 70–99)

## 2019-04-26 LAB — SARS CORONAVIRUS 2 (TAT 6-24 HRS): SARS Coronavirus 2: NEGATIVE

## 2019-04-26 MED ORDER — AMLODIPINE BESYLATE 5 MG PO TABS: 5.0000 mg | ORAL_TABLET | Freq: Every day | ORAL | 0 refills | Status: DC

## 2019-04-26 MED ORDER — AMLODIPINE BESYLATE 5 MG PO TABS
5.0000 mg | ORAL_TABLET | Freq: Every day | ORAL | Status: DC
  Administered 2019-04-26: 5 mg via ORAL
  Filled 2019-04-26: qty 1

## 2019-04-26 NOTE — Plan of Care (Signed)
°  Problem: Education: °Goal: Knowledge of General Education information will improve °Description: Including pain rating scale, medication(s)/side effects and non-pharmacologic comfort measures °Outcome: Progressing °  °Problem: Health Behavior/Discharge Planning: °Goal: Ability to manage health-related needs will improve °Outcome: Progressing °  °Problem: Clinical Measurements: °Goal: Ability to maintain clinical measurements within normal limits will improve °Outcome: Progressing °Goal: Will remain free from infection °Outcome: Progressing °Goal: Diagnostic test results will improve °Outcome: Progressing °  °Problem: Activity: °Goal: Risk for activity intolerance will decrease °Outcome: Progressing °  °Problem: Nutrition: °Goal: Adequate nutrition will be maintained °Outcome: Progressing °  °Problem: Coping: °Goal: Level of anxiety will decrease °Outcome: Progressing °  °Problem: Pain Managment: °Goal: General experience of comfort will improve °Outcome: Progressing °  °Problem: Safety: °Goal: Ability to remain free from injury will improve °Outcome: Progressing °  °Problem: Skin Integrity: °Goal: Risk for impaired skin integrity will decrease °Outcome: Progressing °  °

## 2019-04-26 NOTE — Progress Notes (Signed)
Physical Therapy Treatment Patient Details Name: Nathan Alvarez MRN: [id number] DOB: 1924-11-15 Today's Date: 04/26/2019    History of Present Illness Pt is a 86 y/o male admitted after falling at home in which he sustained a displaced L femoral neck fracture, now s/p arthroplasty bipolar hip (L). PMH including but not limited to a-fib, DM, HTN and pacemaker.    PT Comments    Continuing work on functional mobility and activity tolerance;  Very nice progress and he is showing excellent motivation to get better; During session, I asked about the possibility of him going to his son's home, or his son staying with him -- he is so independent -- he would simply reiterate that with the time he'll have on rehab, he won't need help at home;   Overall progressing well; Anticipate continuing good progress at post-acute rehabilitation.   Follow Up Recommendations  SNF     Equipment Recommendations  None recommended by PT    Recommendations for Other Services       Precautions / Restrictions Precautions Precautions: Fall;Posterior Hip Precaution Booklet Issued: Yes (comment) Precaution Comments: Recalled 2/3 Precautions with min cues to remember "no bending hip past 90deg" Restrictions LLE Weight Bearing: Weight bearing as tolerated    Mobility  Bed Mobility Overal bed mobility: Needs Assistance Bed Mobility: Supine to Sit     Supine to sit: Min guard     General bed mobility comments: increased time and effort, cueing for sequencing, and close guard for keeping from internally rotating L hip  Transfers Overall transfer level: Needs assistance Equipment used: Rolling walker (2 wheeled) Transfers: Sit to/from Stand Sit to Stand: Min assist         General transfer comment: cueing for safe hand placement, min A to power into standing from EOB and for stability with transitional movement  Ambulation/Gait Ambulation/Gait assistance: Min guard Gait Distance (Feet): 150  Feet(or greater, hallway amb) Assistive device: Rolling walker (2 wheeled) Gait Pattern/deviations: Step-through pattern Gait velocity: decreased   General Gait Details: Cues for sequence, posture, and to take small steps to avoid hip internal rotation with turns; Encouraged Mr. Salinger to stand tall on LLE in stance for better posture and more gluteal activation   Stairs             Wheelchair Mobility    Modified Rankin (Stroke Patients Only)       Balance     Sitting balance-Leahy Scale: Fair       Standing balance-Leahy Scale: Poor(approaching Fair)                              Cognition Arousal/Alertness: Awake/alert Behavior During Therapy: WFL for tasks assessed/performed Overall Cognitive Status: Within Functional Limits for tasks assessed(for simple mobility tasks)                                 General Comments: Still not quite getting 3/3 Post Hip Prec when asked      Exercises Total Joint Exercises Ankle Circles/Pumps: AROM;Both;20 reps Quad Sets: AROM;Both;10 reps Towel Squeeze: AROM;Both;10 reps;Other (comment) Short Arc Quad: AROM;Both;10 reps Heel Slides: AAROM;Left;10 reps Hip ABduction/ADduction: AAROM;Left;10 reps    General Comments        Pertinent Vitals/Pain Pain Assessment: Faces Faces Pain Scale: Hurts a little bit Pain Location: L hip Pain Descriptors / Indicators: Grimacing Pain Intervention(s): Monitored during session  Home Living                      Prior Function            PT Goals (current goals can now be found in the care plan section) Acute Rehab PT Goals Patient Stated Goal: to go to rehab and then home PT Goal Formulation: With patient Time For Goal Achievement: 05/07/19 Potential to Achieve Goals: Good Progress towards PT goals: Progressing toward goals    Frequency    Min 3X/week      PT Plan Current plan remains appropriate    Co-evaluation               AM-PAC PT "6 Clicks" Mobility   Outcome Measure  Help needed turning from your back to your side while in a flat bed without using bedrails?: A Little Help needed moving from lying on your back to sitting on the side of a flat bed without using bedrails?: A Little Help needed moving to and from a bed to a chair (including a wheelchair)?: A Little Help needed standing up from a chair using your arms (e.g., wheelchair or bedside chair)?: A Little Help needed to walk in hospital room?: A Little Help needed climbing 3-5 steps with a railing? : A Lot 6 Click Score: 17    End of Session Equipment Utilized During Treatment: Gait belt Activity Tolerance: Patient tolerated treatment well Patient left: in chair;with call bell/phone within reach Nurse Communication: Mobility status PT Visit Diagnosis: Other abnormalities of gait and mobility (R26.89);Pain Pain - Right/Left: Left Pain - part of body: Hip     Time: 1111-1140 PT Time Calculation (min) (ACUTE ONLY): 29 min  Charges:  $Gait Training: 8-22 mins $Therapeutic Exercise: 8-22 mins                     Roney Marion, PT  Acute Rehabilitation Services Pager [phone] Office [place] 04/26/2019, 1:07 PM

## 2019-04-26 NOTE — Progress Notes (Signed)
PROGRESS NOTE    Nathan Alvarez  MGQ:[id number] DOB: Sep 03, 1924 DOA: 04/20/2019 PCP: Curly Rim, MD   Brief Narrative:   86 year old gentleman with history of sick sinus syndrome status post pacemaker placement, hypertension and diabetes who presented with a left femoral neck fracture.  He underwent left hip arthroplasty on January 15.  Hospital course has been complicated by hyponatremia which has resolved, he is now medically ready awaiting DC to SNF.  Assessment & Plan:   Principal Problem:   Femoral neck fracture (HCC) Active Problems:   Hypertension   Diabetes ([place])   Hyponatremia   Anemia   Vitamin D deficiency   Pathological fracture of left hip due to age-related osteoporosis (HCC)  Left femoral neck fracture, status post arthroplasty on 1/15. -Orthopedics consult-S/P Procedure(s) (LRB): ARTHROPLASTY BIPOLAR HIP (HEMIARTHROPLASTY) (Left) by Dr. Marcelino Scot on 04/22/2019 -Vitamin D is low, on calcium and vitamin D. -Consider bisphosphonate therapy in 6 to 8 weeks.  Will see orthopedic as outpatient for further discussion. -PT consulted, recommends SNF - consulted for  transitions of care.  No place available as of today.  Potential discharge later today or when bed offer received.  History of atrial fibrillation, status post pacemaker history of placement. -On aspirin therapy.  Continue carvedilol.  Hyponatremia,Resolved 04/24/2019, likely due to to dehydration as well as medication (HCTZ) -Continue to hold hydrochlorothiazide at DC  Essential hypertension: Blood pressure not controlled.  Continue carvedilol, add Norvasc 5 daily.  Continue to hold hydrochlorothiazide due to HypoNA  Type 2 diabetes, takes Januvia at home.  That has been on hold.  He is not on any SSI either.  We will start him on SSI.  Hold Januvia until DC.  Anemia of chronic disease with mild acute blood loss anemia: Hemoglobin dropped from 11.5-9.4.  No indication of transfusion.  Likely postop, is  stable.  BPH Continue Flomax.  DVT prophylaxis: Enoxaparin   Code Status: FULL  Family Communication: Discussed with patient  Disposition Plan: SNF , TOC consulted    Consultants:   Orthopedics   Procedures:   Left hip arthroplasty 1/15  Antimicrobials:   None    Subjective: Seen and examined.  Feeling very well.  Pain is very well controlled, no narcotics.  No complaints.  Objective: Vitals:   04/25/19 1410 04/25/19 2028 04/26/19 0410 04/26/19 0750  BP: (!) 147/77 (!) 163/92 (!) 151/75 (!) 162/68  Pulse: 64 70 69 64  Resp: 17 18 17 16   Temp: 98.2 F (36.8 C) (!) 97.5 F (36.4 C) 97.9 F (36.6 C) 97.6 F (36.4 C)  TempSrc:  Oral Oral Oral  SpO2: 99%  98% 95%  Weight:      Height:        Intake/Output Summary (Last 24 hours) at 04/26/2019 0906 Last data filed at 04/25/2019 1300 Gross per 24 hour  Intake 240 ml  Output 200 ml  Net 40 ml   Filed Weights   04/23/19 0500 04/24/19 0600 04/25/19 0600  Weight: 66.9 kg 63.9 kg 62.4 kg    Examination: General exam: Appears calm and comfortable  Respiratory system: Clear to auscultation. Respiratory effort normal. Cardiovascular system: S1 & S2 heard, RRR. No JVD, murmurs, rubs, gallops or clicks. No pedal edema. Gastrointestinal system: Abdomen is nondistended, soft and nontender. No organomegaly or masses felt. Normal bowel sounds heard. Central nervous system: Alert and oriented. No focal neurological deficits. Skin: No rashes, lesions or ulcers.  Psychiatry: Judgement and insight appear normal. Mood & affect appropriate.  Data Reviewed: I have personally reviewed following labs and imaging studies  CBC: Recent Labs  Lab 04/20/19 2303 04/21/19 0415 04/22/19 0327 04/23/19 0348 04/24/19 0645 04/25/19 0356 04/26/19 0656  WBC 11.6*   < > 10.8* 9.9 10.8* 9.7 8.4  NEUTROABS 9.4*  --   --   --   --   --   --   HGB 11.5*   < > 10.8* 10.5* 10.1* 9.4* 9.9*  HCT 33.7*   < > 31.5* 29.8* 29.0* 28.1* 29.5*  MCV  86.6   < > 85.6 84.9 85.0 86.5 86.8  PLT 314   < > 280 249 249 256 269   < > = values in this interval not displayed.   Basic Metabolic Panel: Recent Labs  Lab 04/22/19 0327 04/23/19 0348 04/24/19 0645 04/25/19 0356 04/26/19 0656  NA 130* 132* 135 135 138  K 3.8 3.9 3.9 4.1 4.6  CL 98 100 104 101 103  CO2 22 23 23 25 24   GLUCOSE 126* 129* 141* 139* 113*  BUN 33* 35* 36* 30* 26*  CREATININE 1.07 1.13 0.89 0.94 0.83  CALCIUM 8.4* 8.2* 8.1* 8.2* 8.3*   Radiology Studies: No results found.  Scheduled Meds: . acetaminophen  500 mg Oral Q8H  . vitamin C  500 mg Oral Daily  . aspirin EC  81 mg Oral Daily  . calcium carbonate  1 tablet Oral BID WC  . carvedilol  6.25 mg Oral BID  . Chlorhexidine Gluconate Cloth  6 each Topical Daily  . cholecalciferol  2,000 Units Oral BID  . enoxaparin (LOVENOX) injection  40 mg Subcutaneous Q24H  . feeding supplement (GLUCERNA SHAKE)  237 mL Oral BID BM  . insulin aspart  0-5 Units Subcutaneous QHS  . insulin aspart  0-6 Units Subcutaneous TID WC  . multivitamin with minerals  1 tablet Oral Daily  . polyethylene glycol  17 g Oral BID  . tamsulosin  0.4 mg Oral QPC supper   Continuous Infusions: . lactated ringers 10 mL/hr at 04/22/19 0851     LOS: 5 days   Time spent: 29 minutes  Kemet Nijjar 04/24/19, MD  [facility] If 7PM-7AM, please contact night-coverage [email or web address]  04/26/2019, 9:06 AM

## 2019-04-27 LAB — GLUCOSE, CAPILLARY
Glucose-Capillary: 108 mg/dL — ABNORMAL HIGH (ref 70–99)
Glucose-Capillary: 215 mg/dL — ABNORMAL HIGH (ref 70–99)

## 2019-04-27 MED ORDER — AMLODIPINE BESYLATE 10 MG PO TABS
10.0000 mg | ORAL_TABLET | Freq: Every day | ORAL | Status: DC
  Administered 2019-04-27: 10 mg via ORAL
  Filled 2019-04-27: qty 1

## 2019-04-27 MED ORDER — AMLODIPINE BESYLATE 10 MG PO TABS: 10.0000 mg | ORAL_TABLET | Freq: Every day | ORAL | 0 refills | Status: DC

## 2019-04-27 NOTE — Progress Notes (Signed)
Discharge report called to [facility], RN @ SNF.  Denied further questions.  Paperwork with patient for transport.

## 2019-04-27 NOTE — Plan of Care (Signed)
  Problem: Education: Goal: Knowledge of General Education information will improve Description Including pain rating scale, medication(s)/side effects and non-pharmacologic comfort measures Outcome: Progressing   Problem: Health Behavior/Discharge Planning: Goal: Ability to manage health-related needs will improve Outcome: Progressing   Problem: Clinical Measurements: Goal: Ability to maintain clinical measurements within normal limits will improve Outcome: Progressing Goal: Will remain free from infection Outcome: Progressing Goal: Diagnostic test results will improve Outcome: Progressing   Problem: Activity: Goal: Risk for activity intolerance will decrease Outcome: Progressing   Problem: Nutrition: Goal: Adequate nutrition will be maintained Outcome: Progressing   Problem: Coping: Goal: Level of anxiety will decrease Outcome: Progressing   Problem: Elimination: Goal: Will not experience complications related to bowel motility Outcome: Progressing   Problem: Pain Managment: Goal: General experience of comfort will improve Outcome: Progressing   Problem: Safety: Goal: Ability to remain free from injury will improve Outcome: Progressing   

## 2019-04-27 NOTE — TOC Transition Note (Addendum)
Transition of Care [facility]) - CM/SW Discharge Note   Patient Details  Name: Nathan Alvarez MRN: [id number] Date of Birth: [birth year]  Transition of Care [facility]) CM/SW Contact:  Epifanio Lesches, RN Phone Number: [phone] 04/27/2019, 12:26 PM   Clinical Narrative:     Patient will DC to: [facility]. Anticipated DC date: 04/27/2019 Family notified: yes, [place] by: Sharin Mons   Per MD patient ready for DC today . RN, patient, patient's family, and facility notified of DC. Discharge Summary and FL2 sent to facility. RN to call report prior to discharge [phone]). Rm 15 A.  DC packet on chart. Ambulance transport requested for patient.   RNCM will sign off for now as intervention is no longer needed. Please consult [place] again if new needs arise.   Final next level of care: Skilled Nursing Facility Barriers to Discharge: No Barriers Identified   Patient Goals and CMS Choice Patient states their goals for this hospitalization and ongoing recovery are:: To get better CMS Medicare.gov Compare Post Acute Care list provided to:: Patient    Discharge Placement                       Discharge Plan and Services                                     Social Determinants of Health (SDOH) Interventions     Readmission Risk Interventions No flowsheet data found.

## 2019-04-27 NOTE — NC FL2 (Signed)
[place] LEVEL OF CARE SCREENING TOOL     IDENTIFICATION  Patient Name: Nathan Alvarez Birthdate: 1924/11/12 Sex: male Admission Date (Current Location): 04/20/2019  [facility] and [place] Number:  Herbalist and Address:  [facility] [employer], [place] [address], [place], [place] [address]      Provider Number: [id number]  Attending Physician Name and Address:  No att. providers found  Relative Name and Phone Number:  [name] [phone]    Current Level of Care: Hospital Recommended Level of Care: [place] Prior Approval Number:    Date Approved/Denied:   PASRR Number: [id number]  Discharge Plan: SNF    Current Diagnoses: Patient Active Problem List   Diagnosis Date Noted  . Vitamin D deficiency 04/25/2019  . Pathological fracture of left hip due to age-related osteoporosis ([place]) 04/25/2019  . Femoral neck fracture ([place]) 04/21/2019  . Hypertension 04/21/2019  . Diabetes ([place]) 04/21/2019  . Hyponatremia 04/21/2019  . Anemia 04/21/2019    Orientation RESPIRATION BLADDER Height & Weight     Self, Situation, Time, Place  Normal Continent Weight: 62.4 kg Height:  5\' 6"  (167.6 cm)  BEHAVIORAL SYMPTOMS/MOOD NEUROLOGICAL BOWEL NUTRITION STATUS      Continent (see discharge summary)  AMBULATORY STATUS COMMUNICATION OF NEEDS Skin   Extensive Assist Verbally Surgical wounds(ecchymosis)                       Personal Care Assistance Level of Assistance  Bathing, Feeding, Dressing Bathing Assistance: Limited assistance Feeding assistance: Independent Dressing Assistance: Limited assistance     Functional Limitations Info  Sight, Hearing, Speech Sight Info: Impaired Hearing Info: Impaired Speech Info: Adequate    SPECIAL CARE FACTORS FREQUENCY  PT (By licensed PT), OT (By licensed OT)     PT Frequency: 5x per week OT Frequency: 5x per week            Contractures Contractures Info: Not present     Additional Factors Info  Code Status, Allergies Code Status Info: Full Allergies Info: Penicillins           Current Medications (04/27/2019):  This is the current hospital active medication list Current Facility-Administered Medications  Medication Dose Route Frequency Provider Last Rate Last Admin  . acetaminophen (TYLENOL) tablet 500 mg  500 mg Oral Q8H Ainsley Spinner, PA-C   500 mg at 04/27/19 9242  . amLODipine (NORVASC) tablet 10 mg  10 mg Oral Daily Hollice Gong, Mir Mohammed, MD   10 mg at 04/27/19 [phone]  . ascorbic acid (VITAMIN C) tablet 500 mg  500 mg Oral Daily Ainsley Spinner, PA-C   500 mg at 04/27/19 1962  . aspirin EC tablet 81 mg  81 mg Oral Daily Ainsley Spinner, PA-C   81 mg at 04/27/19 2297  . calcium carbonate (OS-CAL - dosed in mg of elemental calcium) tablet 500 mg of elemental calcium  1 tablet Oral BID WC Martyn Malay, MD   500 mg of elemental calcium at 04/27/19 0918  . carvedilol (COREG) tablet 6.25 mg  6.25 mg Oral BID Ainsley Spinner, PA-C   6.25 mg at 04/27/19 0919  . Chlorhexidine Gluconate Cloth 2 % PADS 6 each  6 each Topical Daily Deatra James, MD   6 each at 04/27/19 0920  . cholecalciferol (VITAMIN D3) tablet 2,000 Units  2,000 Units Oral BID Ainsley Spinner, PA-C   2,000 Units at 04/27/19 9892  . enoxaparin (LOVENOX) injection 40  mg  40 mg Subcutaneous Q24H Montez Morita, PA-C   40 mg at 04/27/19 0086  . feeding supplement (GLUCERNA SHAKE) (GLUCERNA SHAKE) liquid 237 mL  237 mL Oral BID BM Hughie Closs, MD   237 mL at 04/27/19 0920  . HYDROcodone-acetaminophen (NORCO) 7.5-325 MG per tablet 1-2 tablet  1-2 tablet Oral Q4H PRN Montez Morita, PA-C      . HYDROcodone-acetaminophen (NORCO/VICODIN) 5-325 MG per tablet 1-2 tablet  1-2 tablet Oral Q4H PRN Montez Morita, PA-C      . insulin aspart (novoLOG) injection 0-5 Units  0-5 Units Subcutaneous QHS Pahwani, Ravi, MD      . insulin aspart (novoLOG) injection 0-6 Units  0-6 Units Subcutaneous TID WC Hughie Closs, MD   1  Units at 04/26/19 1232  . lactated ringers infusion   Intravenous Continuous Montez Morita, PA-C 10 mL/hr at 04/22/19 0851 New Bag at 04/22/19 1143  . menthol-cetylpyridinium (CEPACOL) lozenge 3 mg  1 lozenge Oral PRN Montez Morita, PA-C       Or  . phenol (CHLORASEPTIC) mouth spray 1 spray  1 spray Mouth/Throat PRN Montez Morita, PA-C      . morphine 2 MG/ML injection 0.5-1 mg  0.5-1 mg Intravenous Q2H PRN Montez Morita, PA-C      . multivitamin with minerals tablet 1 tablet  1 tablet Oral Daily Hughie Closs, MD   1 tablet at 04/27/19 [phone]  . ondansetron (ZOFRAN) tablet 4 mg  4 mg Oral Q6H PRN Montez Morita, PA-C       Or  . ondansetron [facility]) injection 4 mg  4 mg Intravenous Q6H PRN Montez Morita, PA-C      . polyethylene glycol (MIRALAX / GLYCOLAX) packet 17 g  17 g Oral BID Westley Chandler, MD   17 g at 04/27/19 [phone]  . senna (SENOKOT) tablet 8.6 mg  1 tablet Oral Daily PRN Westley Chandler, MD      . tamsulosin [facility]) capsule 0.4 mg  0.4 mg Oral QPC supper Westley Chandler, MD   0.4 mg at 04/26/19 1723   Current Outpatient Medications  Medication Sig Dispense Refill  . aspirin EC 81 MG tablet Take 81 mg by mouth daily.    . carvedilol (COREG) 6.25 MG tablet TAKE 1 TABLET BY MOUTH TWICE DAILY (Patient taking differently: Take 6.25 mg by mouth daily. ) 180 tablet 2  . JANUVIA 50 MG tablet Take 50 mg by mouth daily.    . metoprolol succinate (TOPROL-XL) 25 MG 24 hr tablet Take 25 mg by mouth daily.     . polyethylene glycol powder (GLYCOLAX/MIRALAX) 17 GM/SCOOP powder Take by mouth.    . tamsulosin (FLOMAX) 0.4 MG CAPS capsule Take by mouth.    Marland Kitchen acetaminophen (TYLENOL) 500 MG tablet Take 1 tablet (500 mg total) by mouth every 8 (eight) hours. 30 tablet 0  . amLODipine (NORVASC) 10 MG tablet Take 1 tablet (10 mg total) by mouth daily. 30 tablet 0  . ascorbic acid (VITAMIN C) 500 MG tablet Take 1 tablet (500 mg total) by mouth daily.    . calcium carbonate (OS-CAL - DOSED IN MG OF ELEMENTAL CALCIUM)  1250 (500 Ca) MG tablet Take 1 tablet (500 mg of elemental calcium total) by mouth 2 (two) times daily with a meal. 60 tablet 0  . Cholecalciferol (VITAMIN D) 125 MCG (5000 UT) CAPS Take 1 capsule by mouth daily. 30 capsule 3  . enoxaparin (LOVENOX) 40 MG/0.4ML injection Inject 0.4 mLs (40 mg total)  into the skin daily for 28 days. 11.2 mL 0     Discharge Medications: Please see discharge summary for a list of discharge medications.  Relevant Imaging Results:  Relevant Lab Results:   Additional Information ss# [id number] [place], [place]

## 2019-04-27 NOTE — Discharge Summary (Signed)
Discharge Summary  Nathan Alvarez YBO:[id number] DOB: Sep 03, 1924  PCP: Vivien Presto, MD  Admit date: 04/20/2019 Discharge date: 04/27/2019   Recommendations for Outpatient Follow-up:  1. Orthopedic surgery 10-14 days postOP 2. PCP 2 weeks   Discharge Diagnoses:  Active Hospital Problems   Diagnosis Date Noted  . Femoral neck fracture (HCC) 04/21/2019  . Vitamin D deficiency 04/25/2019  . Pathological fracture of left hip due to age-related osteoporosis (HCC) 04/25/2019  . Hypertension 04/21/2019  . Diabetes (HCC) 04/21/2019  . Hyponatremia 04/21/2019  . Anemia 04/21/2019    Resolved Hospital Problems  No resolved problems to display.    Discharge Condition: Stable   Diet recommendation: As tolerated Heart Healthy   Vitals:   04/27/19 0404 04/27/19 0744  BP: (!) 154/73 (!) 170/71  Pulse: 66 68  Resp: 18 19  Temp: 97.9 F (36.6 C) 98.1 F (36.7 C)  SpO2: 98% 97%    HPI and Brief Hospital Course:  86 year old gentleman with history of sick sinus syndrome status post pacemaker placement, hypertension and diabetes who presented with a left femoral neck fracture.  He underwent left hip arthroplasty on January 15.  Hospital course has been complicated by hyponatremia which has resolved, and he was started on Amlodipine to treat HTN as his HCTZ was stopped due to hyponatremia. He will be DC to SNF when bed is available.   Discharge details, plan of care and follow up instructions were discussed with patient and any available family or care providers. Patient and family are in agreement with discharge from the hospital today and all questions were answered to their satisfaction.  Consultations:  Orthopedic surgery   Discharge Exam: BP (!) 170/71 (BP Location: Left Arm)   Pulse 68   Temp 98.1 F (36.7 C) (Oral)   Resp 19   Ht 5\' 6"  (1.676 m)   Wt 62.4 kg   SpO2 97%   BMI 22.20 kg/m  General:  Alert, oriented, calm, in no acute distress, sitting up having  breakfast Eyes: EOMI, clear sclerea Neck: supple, no masses, trachea mildline  Cardiovascular: RRR, no murmurs or rubs, no peripheral edema  Respiratory: clear to auscultation bilaterally, no wheezes, no crackles  Abdomen: soft, nontender, nondistended, normal bowel tones heard  Skin: dry, no rashes  Musculoskeletal: no joint effusions, normal range of motion  Psychiatric: appropriate affect, normal speech  Neurologic: extraocular muscles intact, clear speech, moving all extremities with intact sensorium    Discharge Instructions Weightbearing: WBAT LLE Insicional and dressing care: OK to remove dressings as of now and leave open to air with dry gauze PRN Orthopedic device(s): walker Showering: ok to shower and clean wounds with soap and water only VTE prophylaxis: Lovenox 40mg  qd 30 days Pain control: tylenol  Follow - up plan: 10-14 days Contact information:  MD, PA-C  You were cared for by a hospitalist during your hospital stay. If you have any questions about your discharge medications or the care you received while you were in the hospital after you are discharged, you can call the unit and asked to speak with the hospitalist on call if the hospitalist that took care of you is not available. Once you are discharged, your primary care physician will handle any further medical issues. Please note that NO REFILLS for any discharge medications will be authorized once you are discharged, as it is imperative that you return to your primary care physician (or establish a relationship with a primary care physician if  you do not have one) for your aftercare needs so that they can reassess your need for medications and monitor your lab values.   Allergies as of 04/27/2019      Reactions   Penicillins    unknown      Medication List    STOP taking these medications   acetaminophen 650 MG CR tablet Commonly known as: TYLENOL Replaced by: acetaminophen 500 MG  tablet   furosemide 20 MG tablet Commonly known as: LASIX   hydrochlorothiazide 25 MG tablet Commonly known as: HYDRODIURIL     TAKE these medications   acetaminophen 500 MG tablet Commonly known as: TYLENOL Take 1 tablet (500 mg total) by mouth every 8 (eight) hours. Replaces: acetaminophen 650 MG CR tablet   amLODipine 10 MG tablet Commonly known as: NORVASC Take 1 tablet (10 mg total) by mouth daily.   ascorbic acid 500 MG tablet Commonly known as: VITAMIN C Take 1 tablet (500 mg total) by mouth daily.   aspirin EC 81 MG tablet Take 81 mg by mouth daily.   calcium carbonate 1250 (500 Ca) MG tablet Commonly known as: OS-CAL - dosed in mg of elemental calcium Take 1 tablet (500 mg of elemental calcium total) by mouth 2 (two) times daily with a meal.   carvedilol 6.25 MG tablet Commonly known as: COREG TAKE 1 TABLET BY MOUTH TWICE DAILY What changed: when to take this   enoxaparin 40 MG/0.4ML injection Commonly known as: LOVENOX Inject 0.4 mLs (40 mg total) into the skin daily for 28 days.   Januvia 50 MG tablet Generic drug: sitaGLIPtin Take 50 mg by mouth daily.   metoprolol succinate 25 MG 24 hr tablet Commonly known as: TOPROL-XL Take 25 mg by mouth daily.   polyethylene glycol powder 17 GM/SCOOP powder Commonly known as: GLYCOLAX/MIRALAX Take by mouth.   tamsulosin 0.4 MG Caps capsule Commonly known as: FLOMAX Take by mouth.   Vitamin D 125 MCG (5000 UT) Caps Take 1 capsule by mouth daily.      Allergies  Allergen Reactions  . Penicillins     unknown    Contact information for follow-up providers    Myrene Galas, MD. Schedule an appointment as soon as possible for a visit in 2 week(s).   Specialty: Orthopedic Surgery Contact information: [address] [place] [address] [phone]            Contact information for after-discharge care    Destination    [facility] Preferred SNF .     Service: Skilled Nursing Contact information: [address] [place] [address] [phone]                   The results of significant diagnostics from this hospitalization (including imaging, microbiology, ancillary and laboratory) are listed below for reference.    Significant Diagnostic Studies: DG Chest Port 1 View  Result Date: 04/20/2019 CLINICAL DATA:  Fall, left hip pain EXAM: PORTABLE CHEST 1 VIEW COMPARISON:  None. FINDINGS: Left pacer in place with leads in the right atrium and right ventricle. Heart is normal size. No confluent airspace opacities. No effusions. No acute bony abnormality. IMPRESSION: No active disease. Electronically Signed   By: Charlett Nose M.D.   On: 04/20/2019 23:45   DG Hip Port Unilat With Pelvis 1V Left  Result Date: 04/22/2019 CLINICAL DATA:  Post arthroplasty EXAM: DG HIP (WITH OR WITHOUT PELVIS) 1V PORT LEFT COMPARISON:  None. FINDINGS: Interval  unipolar LEFT hip arthroplasty for surgical repair of femoral neck fracture. Postsurgical change in the soft tissues. No dislocation or fracture IMPRESSION: No complication following unipolar hip arthroplasty Electronically Signed   By: Suzy Bouchard M.D.   On: 04/22/2019 14:16   DG Hip Unilat With Pelvis 2-3 Views Left  Result Date: 04/20/2019 CLINICAL DATA:  Fall, deformity EXAM: DG HIP (WITH OR WITHOUT PELVIS) 2-3V LEFT COMPARISON:  None. FINDINGS: There is a left femoral neck fracture with varus angulation. No subluxation or dislocation. Mild symmetric degenerative changes in the hips bilaterally. Diffuse osteopenia. IMPRESSION: Left femoral neck fracture with varus angulation. Electronically Signed   By: Rolm Baptise M.D.   On: 04/20/2019 23:46    Microbiology: Recent Results (from the past 240 hour(s))  Respiratory Panel by RT PCR (Flu A&B, Covid) - Nasopharyngeal Swab     Status: None   Collection Time: 04/20/19 11:54 PM   Specimen: Nasopharyngeal Swab  Result Value Ref  Range Status   SARS Coronavirus 2 by RT PCR NEGATIVE NEGATIVE Final    Comment: (NOTE) SARS-CoV-2 target nucleic acids are NOT DETECTED. The SARS-CoV-2 RNA is generally detectable in upper respiratoy specimens during the acute phase of infection. The lowest concentration of SARS-CoV-2 viral copies this assay can detect is 131 copies/mL. A negative result does not preclude SARS-Cov-2 infection and should not be used as the sole basis for treatment or other patient management decisions. A negative result may occur with  improper specimen collection/handling, submission of specimen other than nasopharyngeal swab, presence of viral mutation(s) within the areas targeted by this assay, and inadequate number of viral copies (<131 copies/mL). A negative result must be combined with clinical observations, patient history, and epidemiological information. The expected result is Negative. Fact Sheet for Patients:  PinkCheek.be Fact Sheet for Healthcare Providers:  GravelBags.it This test is not yet ap proved or cleared by the [place] FDA and  has been authorized for detection and/or diagnosis of SARS-CoV-2 by FDA under an Emergency Use Authorization (EUA). This EUA will remain  in effect (meaning this test can be used) for the duration of the COVID-19 declaration under Section 564(b)(1) of the Act, 21 U.S.C. section 360bbb-3(b)(1), unless the authorization is terminated or revoked sooner.    Influenza A by PCR NEGATIVE NEGATIVE Final   Influenza B by PCR NEGATIVE NEGATIVE Final    Comment: (NOTE) The Xpert Xpress SARS-CoV-2/FLU/RSV assay is intended as an aid in  the diagnosis of influenza from Nasopharyngeal swab specimens and  should not be used as a sole basis for treatment. Nasal washings and  aspirates are unacceptable for Xpert Xpress SARS-CoV-2/FLU/RSV  testing. Fact Sheet for  Patients: PinkCheek.be Fact Sheet for Healthcare Providers: GravelBags.it This test is not yet approved or cleared by the [place] FDA and  has been authorized for detection and/or diagnosis of SARS-CoV-2 by  FDA under an Emergency Use Authorization (EUA). This EUA will remain  in effect (meaning this test can be used) for the duration of the  Covid-19 declaration under Section 564(b)(1) of the Act, 21  U.S.C. section 360bbb-3(b)(1), unless the authorization is  terminated or revoked. Performed at [facility], [address], [place], [place] [address]   SARS CORONAVIRUS 2 (TAT 6-24 HRS) Nasopharyngeal Nasopharyngeal Swab     Status: None   Collection Time: 04/26/19  2:48 PM   Specimen: Nasopharyngeal Swab  Result Value Ref Range Status   SARS Coronavirus 2 NEGATIVE NEGATIVE Final    Comment: (NOTE) SARS-CoV-2 target  nucleic acids are NOT DETECTED. The SARS-CoV-2 RNA is generally detectable in upper and lower respiratory specimens during the acute phase of infection. Negative results do not preclude SARS-CoV-2 infection, do not rule out co-infections with other pathogens, and should not be used as the sole basis for treatment or other patient management decisions. Negative results must be combined with clinical observations, patient history, and epidemiological information. The expected result is Negative. Fact Sheet for Patients: HairSlick.no Fact Sheet for Healthcare Providers: quierodirigir.com This test is not yet approved or cleared by the [place] FDA and  has been authorized for detection and/or diagnosis of SARS-CoV-2 by FDA under an Emergency Use Authorization (EUA). This EUA will remain  in effect (meaning this test can be used) for the duration of the COVID-19 declaration under Section 56 4(b)(1) of the Act, 21 U.S.C. section 360bbb-3(b)(1), unless  the authorization is terminated or revoked sooner. Performed at [facility], [address], [place], [place] [address]      Labs: Basic Metabolic Panel: Recent Labs  Lab 04/22/19 0327 04/23/19 0348 04/24/19 0645 04/25/19 0356 04/26/19 0656  NA 130* 132* 135 135 138  K 3.8 3.9 3.9 4.1 4.6  CL 98 100 104 101 103  CO2 22 23 23 25 24   GLUCOSE 126* 129* 141* 139* 113*  BUN 33* 35* 36* 30* 26*  CREATININE 1.07 1.13 0.89 0.94 0.83  CALCIUM 8.4* 8.2* 8.1* 8.2* 8.3*   Liver Function Tests: Recent Labs  Lab 04/21/19 0415  AST 16  ALT 13  ALKPHOS 100  BILITOT 0.9  PROT 7.1  ALBUMIN 3.6   No results for input(s): LIPASE, AMYLASE in the last 168 hours. No results for input(s): AMMONIA in the last 168 hours. CBC: Recent Labs  Lab 04/20/19 2303 04/21/19 0415 04/22/19 0327 04/23/19 0348 04/24/19 0645 04/25/19 0356 04/26/19 0656  WBC 11.6*   < > 10.8* 9.9 10.8* 9.7 8.4  NEUTROABS 9.4*  --   --   --   --   --   --   HGB 11.5*   < > 10.8* 10.5* 10.1* 9.4* 9.9*  HCT 33.7*   < > 31.5* 29.8* 29.0* 28.1* 29.5*  MCV 86.6   < > 85.6 84.9 85.0 86.5 86.8  PLT 314   < > 280 249 249 256 269   < > = values in this interval not displayed.   Cardiac Enzymes: No results for input(s): CKTOTAL, CKMB, CKMBINDEX, TROPONINI in the last 168 hours. BNP: BNP (last 3 results) No results for input(s): BNP in the last 8760 hours.  ProBNP (last 3 results) No results for input(s): PROBNP in the last 8760 hours.  CBG: Recent Labs  Lab 04/26/19 0640 04/26/19 1121 04/26/19 1721 04/26/19 2213 04/27/19 0645  GLUCAP 110* 197* 87 155* 108*    Time spent: 32 minutes were spent in preparing this discharge including medication reconciliation, counseling, and coordination of care.  Signed:  Ama Mcmaster 04/29/19, MD  [facility] 04/27/2019, 8:58 AM

## 2019-05-17 NOTE — Op Note (Signed)
pointment to review these additional issues.  PATIENT:  Nathan Alvarez  02-Sep-1924  PRE-OPERATIVE DIAGNOSIS:  DISPLACED LEFT FEMORAL NECK FRACTURE  POST-OPERATIVE DIAGNOSIS:  DISPLACED LEFT FEMORAL NECK FRACTURE  PROCEDURE:  Procedure(s): UNIPOLAR HEMIARTHROPLASTY OF THE LEFT HIP with [place]uy [place] Basic #6 femoral stem, standard neck and mm head  SURGEON:  Surgeon(s) and Role:    Altamese Ronan, MD - Primary  PHYSICIAN ASSISTANT: Ainsley Spinner, PA-C  ANESTHESIA:   general  EBL:  200 mL   BLOOD ADMINISTERED:none  DRAINS: none   LOCAL MEDICATIONS USED:  NONE  SPECIMEN:  No Specimen  DISPOSITION OF SPECIMEN:  N/A  COUNTS:  YES  TOURNIQUET:  * No tourniquets in log *  DICTATION: Note written in EPIC  PLAN OF CARE: Admit to inpatient   PATIENT DISPOSITION:  PACU - hemodynamically stable.   Delay start of Pharmacological VTE agent (>24hrs) due to surgical blood loss or risk of bleeding: no  BRIEF SUMMARY OF INDICATION FOR PROCEDURE:  Nathan Alvarez is a very pleasant 86 y.o. with dementia, who sustained an unwitnessed fall producing inability to bear weight, shortening, and external rotation of the extremity.  She was seen and evaluated with the recommendation for hemiarthroplasty. I discussed with the patient and family the risks and benefits, inclding the potential for leg length inequality, dislocation or instability, arthritis, loss of motion, DVT, PE, heart attack, stroke, and death.  Consent was given to proceed.  BRIEF SUMMARY OF PROCEDURE:  The patient was taken to the operating room where general anesthesia was induced and after administration of preoperative antibiotics consisting of 2 g of Ancef.  He was positioned with the left side up and all prominences were padded appropriately.  We made a 10 cm incision after the time-out, carrying dissection down to the IT band, was split in line with the skin.  Cerebellar retractor was placed and we were able to then  flex and internally rotate the hip releasing the piriformis and short rotators at their insertions.  The capsule was then T'd, tagging the corners with #1 Vicryl.  The neck cut was refined using a cutting guide and then this was followed by removal of the head, which sized perfectly to mm. Acetabular trials were placed, confirming this size as the best fit. Mueller and Cobra retractors were placed along the proximal femur, which was then prepared with the canal finder,  then lateralizer, followed by reamers up to #6, and the broaches, achieving  outstanding fit and fill with the #6 broach.  The calcar reamer was used to refine the cut as we were using a low demand stem.  The canal was irrigated thoroughly and the acetabulum once again searched multiple times for fragments and irrigated thoroughly.  Trial components were placed and the patient had outstanding stability in combine 90 degrees of flexion, adduction, and internal rotation as well as in external rotation and extension.  Consequently, actual components were placed.  My assistant Ainsley Spinner, was necessary for delivery and control of the proximal femur during preparation, also during relocation and dislocation of the trial components as well as relocation of the actual components.  He assisted me with wound closure as well.  I did repair the capsule with #1 Vicryl and then used #2 FiberWire through bone tunnels to repair the short rotators and piriformis.  This was followed by a #1 Vicryl for the IT band and lastly 2-0 Vicryl and nylon for the subcutaneous and skin.  Sterile gently compressive dressing was  applied.  The patient was awakened from anesthesia and transported to the PACU in stable condition.  PROGNOSIS:  The patient will be weightbearing as tolerated with posterior hip precautions.  Patient has an elevated risk of complications related to declining overall health and mobility.  Aundra Millet remains on the Medical  Service and will be on DVT prophylaxis mechanically and with Lovenox which will continue post operatively.     Doralee Albino. Carola Frost, M.D.

## 2019-05-23 DIAGNOSIS — R351 Nocturia: Secondary | ICD-10-CM | POA: Insufficient documentation

## 2019-05-23 DIAGNOSIS — N401 Enlarged prostate with lower urinary tract symptoms: Secondary | ICD-10-CM | POA: Insufficient documentation

## 2019-05-25 ENCOUNTER — Ambulatory Visit (INDEPENDENT_AMBULATORY_CARE_PROVIDER_SITE_OTHER): Payer: Medicare Other | Admitting: *Deleted

## 2019-05-25 DIAGNOSIS — I495 Sick sinus syndrome: Secondary | ICD-10-CM

## 2019-05-25 LAB — CUP PACEART REMOTE DEVICE CHECK
Battery Impedance: 3012 Ohm
Battery Remaining Longevity: 23 mo
Battery Voltage: 2.73 V
Brady Statistic AP VP Percent: 2 %
Brady Statistic AP VS Percent: 95 %
Brady Statistic AS VP Percent: 0 %
Brady Statistic AS VS Percent: 3 %
Date Time Interrogation Session: 20210217153626
Implantable Lead Implant Date: 20110513
Implantable Lead Implant Date: 20110513
Implantable Lead Location: 753859
Implantable Lead Location: 753860
Implantable Lead Model: 5092
Implantable Lead Model: 5594
Implantable Pulse Generator Implant Date: 20110513
Lead Channel Impedance Value: 433 Ohm
Lead Channel Impedance Value: 608 Ohm
Lead Channel Pacing Threshold Amplitude: 0.375 V
Lead Channel Pacing Threshold Amplitude: 0.5 V
Lead Channel Pacing Threshold Pulse Width: 0.4 ms
Lead Channel Pacing Threshold Pulse Width: 0.4 ms
Lead Channel Setting Pacing Amplitude: 1.5 V
Lead Channel Setting Pacing Amplitude: 2 V
Lead Channel Setting Pacing Pulse Width: 0.4 ms
Lead Channel Setting Sensing Sensitivity: 2.8 mV

## 2019-05-26 NOTE — Progress Notes (Signed)
PPM Remote  

## 2019-06-30 ENCOUNTER — Ambulatory Visit: Payer: Medicare Other | Admitting: Physical Therapy

## 2019-08-24 ENCOUNTER — Ambulatory Visit (INDEPENDENT_AMBULATORY_CARE_PROVIDER_SITE_OTHER): Payer: Medicare Other | Admitting: *Deleted

## 2019-08-24 DIAGNOSIS — I495 Sick sinus syndrome: Secondary | ICD-10-CM

## 2019-08-24 LAB — CUP PACEART REMOTE DEVICE CHECK
Battery Impedance: 3385 Ohm
Battery Remaining Longevity: 20 mo
Battery Voltage: 2.71 V
Brady Statistic AP VP Percent: 2 %
Brady Statistic AP VS Percent: 95 %
Brady Statistic AS VP Percent: 0 %
Brady Statistic AS VS Percent: 3 %
Date Time Interrogation Session: 20210519093746
Implantable Lead Implant Date: 20110513
Implantable Lead Implant Date: 20110513
Implantable Lead Location: 753859
Implantable Lead Location: 753860
Implantable Lead Model: 5092
Implantable Lead Model: 5594
Implantable Pulse Generator Implant Date: 20110513
Lead Channel Impedance Value: 470 Ohm
Lead Channel Impedance Value: 653 Ohm
Lead Channel Pacing Threshold Amplitude: 0.5 V
Lead Channel Pacing Threshold Amplitude: 0.5 V
Lead Channel Pacing Threshold Pulse Width: 0.4 ms
Lead Channel Pacing Threshold Pulse Width: 0.4 ms
Lead Channel Setting Pacing Amplitude: 1.5 V
Lead Channel Setting Pacing Amplitude: 2 V
Lead Channel Setting Pacing Pulse Width: 0.46 ms
Lead Channel Setting Sensing Sensitivity: 4 mV

## 2019-08-26 NOTE — Progress Notes (Signed)
Remote pacemaker transmission.   

## 2019-11-23 ENCOUNTER — Ambulatory Visit (INDEPENDENT_AMBULATORY_CARE_PROVIDER_SITE_OTHER): Payer: Medicare Other | Admitting: *Deleted

## 2019-11-23 DIAGNOSIS — I495 Sick sinus syndrome: Secondary | ICD-10-CM

## 2019-11-23 LAB — CUP PACEART REMOTE DEVICE CHECK
Battery Impedance: 3702 Ohm
Battery Remaining Longevity: 18 mo
Battery Voltage: 2.72 V
Brady Statistic AP VP Percent: 2 %
Brady Statistic AP VS Percent: 95 %
Brady Statistic AS VP Percent: 0 %
Brady Statistic AS VS Percent: 3 %
Date Time Interrogation Session: 20210818104535
Implantable Lead Implant Date: 20110513
Implantable Lead Implant Date: 20110513
Implantable Lead Location: 753859
Implantable Lead Location: 753860
Implantable Lead Model: 5092
Implantable Lead Model: 5594
Implantable Pulse Generator Implant Date: 20110513
Lead Channel Impedance Value: 463 Ohm
Lead Channel Impedance Value: 662 Ohm
Lead Channel Pacing Threshold Amplitude: 0.375 V
Lead Channel Pacing Threshold Amplitude: 0.5 V
Lead Channel Pacing Threshold Pulse Width: 0.4 ms
Lead Channel Pacing Threshold Pulse Width: 0.4 ms
Lead Channel Setting Pacing Amplitude: 1.5 V
Lead Channel Setting Pacing Amplitude: 2 V
Lead Channel Setting Pacing Pulse Width: 0.4 ms
Lead Channel Setting Sensing Sensitivity: 4 mV

## 2019-11-25 NOTE — Progress Notes (Signed)
Remote pacemaker transmission.   

## 2020-01-09 ENCOUNTER — Other Ambulatory Visit: Payer: Self-pay | Admitting: Internal Medicine

## 2020-02-22 ENCOUNTER — Ambulatory Visit (INDEPENDENT_AMBULATORY_CARE_PROVIDER_SITE_OTHER): Payer: Medicare Other

## 2020-02-22 DIAGNOSIS — I495 Sick sinus syndrome: Secondary | ICD-10-CM | POA: Diagnosis not present

## 2020-02-26 LAB — CUP PACEART REMOTE DEVICE CHECK
Battery Impedance: 4048 Ohm
Battery Remaining Longevity: 16 mo
Battery Voltage: 2.7 V
Brady Statistic AP VP Percent: 2 %
Brady Statistic AP VS Percent: 95 %
Brady Statistic AS VP Percent: 0 %
Brady Statistic AS VS Percent: 3 %
Date Time Interrogation Session: 20211119150238
Implantable Lead Implant Date: 20110513
Implantable Lead Implant Date: 20110513
Implantable Lead Location: 753859
Implantable Lead Location: 753860
Implantable Lead Model: 5092
Implantable Lead Model: 5594
Implantable Pulse Generator Implant Date: 20110513
Lead Channel Impedance Value: 500 Ohm
Lead Channel Impedance Value: 709 Ohm
Lead Channel Pacing Threshold Amplitude: 0.5 V
Lead Channel Pacing Threshold Amplitude: 0.625 V
Lead Channel Pacing Threshold Pulse Width: 0.4 ms
Lead Channel Pacing Threshold Pulse Width: 0.4 ms
Lead Channel Setting Pacing Amplitude: 1.5 V
Lead Channel Setting Pacing Amplitude: 2 V
Lead Channel Setting Pacing Pulse Width: 0.4 ms
Lead Channel Setting Sensing Sensitivity: 2.8 mV

## 2020-02-27 NOTE — Progress Notes (Signed)
Remote pacemaker transmission.   

## 2020-03-06 ENCOUNTER — Other Ambulatory Visit: Payer: Self-pay

## 2020-03-06 ENCOUNTER — Ambulatory Visit (INDEPENDENT_AMBULATORY_CARE_PROVIDER_SITE_OTHER): Payer: Medicare Other | Admitting: Internal Medicine

## 2020-03-06 DIAGNOSIS — Z950 Presence of cardiac pacemaker: Secondary | ICD-10-CM | POA: Diagnosis not present

## 2020-03-06 DIAGNOSIS — I495 Sick sinus syndrome: Secondary | ICD-10-CM | POA: Diagnosis not present

## 2020-03-06 NOTE — Patient Instructions (Signed)
Medication Instructions:  Your physician recommends that you continue on your current medications as directed. Please refer to the Current Medication list given to you today.  Labwork: None ordered.  Testing/Procedures: None ordered.  Follow-Up: Your physician wants you to follow-up in: one year with Dr. Ladona Ridgel.   You will receive a reminder letter in the mail two months in advance. If you don't receive a letter, please call our office to schedule the follow-up appointment.  Remote monitoring is used to monitor your Pacemaker from home. This monitoring reduces the number of office visits required to check your device to one time per year. It allows [place] to keep an eye on the functioning of your device to ensure it is working properly. You are scheduled for a device check from home on 05/23/2020. You may send your transmission at any time that day. If you have a wireless device, the transmission will be sent automatically. After your physician reviews your transmission, you will receive a postcard with your next transmission date.  Any Other Special Instructions Will Be Listed Below (If Applicable).  If you need a refill on your cardiac medications before your next appointment, please call your pharmacy.

## 2020-03-06 NOTE — Progress Notes (Signed)
HPI Mr. Gift returns today for followup of his PPM. He is a pleasant 86 yo man with a h/o sinus node dysfunction, s/p PPM insertion, HTN, and atrial fib. He has not had syncope. He has mild edema. He denies chest pain or sob.  Allergies  Allergen Reactions  . Penicillins     unknown     Current Outpatient Medications  Medication Sig Dispense Refill  . acetaminophen (TYLENOL) 500 MG tablet Take 1 tablet (500 mg total) by mouth every 8 (eight) hours. 30 tablet 0  . ascorbic acid (VITAMIN C) 500 MG tablet Take 1 tablet (500 mg total) by mouth daily.    Marland Kitchen aspirin EC 81 MG tablet Take 81 mg by mouth daily.    . calcium carbonate (OS-CAL - DOSED IN MG OF ELEMENTAL CALCIUM) 1250 (500 Ca) MG tablet Take 1 tablet (500 mg of elemental calcium total) by mouth 2 (two) times daily with a meal. 60 tablet 0  . carvedilol (COREG) 6.25 MG tablet TAKE 1 TABLET BY MOUTH TWICE DAILY 180 tablet 0  . Cholecalciferol (VITAMIN D) 125 MCG (5000 UT) CAPS Take 1 capsule by mouth daily. 30 capsule 3  . JANUVIA 50 MG tablet Take 50 mg by mouth daily.    . polyethylene glycol powder (GLYCOLAX/MIRALAX) 17 GM/SCOOP powder Take by mouth.    . tamsulosin (FLOMAX) 0.4 MG CAPS capsule Take by mouth.    Marland Kitchen amLODipine (NORVASC) 10 MG tablet Take 1 tablet (10 mg total) by mouth daily. 30 tablet 0   No current facility-administered medications for this visit.     Past Medical History:  Diagnosis Date  . A-fib (HCC)   . Dizziness   . DM (diabetes mellitus) (HCC)   . Esophagitis, reflux   . Heart block   . Hyperlipidemia   . Hypertension, essential, benign   . Memory deficit   . Pathological fracture of left hip due to age-related osteoporosis (HCC) 04/25/2019  . SOB (shortness of breath)   . SSS (sick sinus syndrome) (HCC)     ROS:   All systems reviewed and negative except as noted in the HPI.   Past Surgical History:  Procedure Laterality Date  . CATARACT EXTRACTION Right   . HIP ARTHROPLASTY  Left 04/22/2019   Procedure: ARTHROPLASTY BIPOLAR HIP (HEMIARTHROPLASTY);  Surgeon: Myrene Galas, MD;  Location: [facility] OR;  Service: Orthopedics;  Laterality: Left;  . INGUINAL HERNIA REPAIR    . INSERT / REPLACE / REMOVE PACEMAKER    . PACEMAKER INSERTION    . TONSILLECTOMY AND ADENOIDECTOMY       Family History  Problem Relation Age of Onset  . Heart disease Mother      Social History   Socioeconomic History  . Marital status: Married    Spouse name: Not on file  . Number of children: Not on file  . Years of education: Not on file  . Highest education level: Not on file  Occupational History  . Not on file  Tobacco Use  . Smoking status: Former Smoker    Quit date: 01/01/1956    Years since quitting: 64.2  . Smokeless tobacco: Never Used  Vaping Use  . Vaping Use: Never used  Substance and Sexual Activity  . Alcohol use: No  . Drug use: No  . Sexual activity: Not Currently    Comment: MARRIED  Other Topics Concern  . Not on file  Social History Narrative  . Not on file  Social Determinants of Health   Financial Resource Strain:   . Difficulty of Paying Living Expenses: Not on file  Food Insecurity:   . Worried About Programme researcher, broadcasting/film/video in the Last Year: Not on file  . Ran Out of Food in the Last Year: Not on file  Transportation Needs:   . Lack of Transportation (Medical): Not on file  . Lack of Transportation (Non-Medical): Not on file  Physical Activity:   . Days of Exercise per Week: Not on file  . Minutes of Exercise per Session: Not on file  Stress:   . Feeling of Stress : Not on file  Social Connections:   . Frequency of Communication with Friends and Family: Not on file  . Frequency of Social Gatherings with Friends and Family: Not on file  . Attends Religious Services: Not on file  . Active Member of Clubs or Organizations: Not on file  . Attends Banker Meetings: Not on file  . Marital Status: Not on file  Intimate Partner  Violence:   . Fear of Current or Ex-Partner: Not on file  . Emotionally Abused: Not on file  . Physically Abused: Not on file  . Sexually Abused: Not on file     BP (!) 146/72   Pulse 76   Ht 5\' 6"  (1.676 m)   Wt 143 lb (64.9 kg)   SpO2 96%   BMI 23.08 kg/m   Physical Exam:  Well appearing NAD HEENT: Unremarkable Neck:  No JVD, no thyromegally Lymphatics:  No adenopathy Back:  No CVA tenderness Lungs:  Clear with no wheezes HEART:  Regular rate rhythm, no murmurs, no rubs, no clicks Abd:  soft, positive bowel sounds, no organomegally, no rebound, no guarding Ext:  2 plus pulses, no edema, no cyanosis, no clubbing Skin:  No rashes no nodules Neuro:  CN II through XII intact, motor grossly intact  EKG - NSR with first degree AV block and occaisional ventricular pacing  DEVICE  Normal device function.  See PaceArt for details.   Assess/Plan: 1. Sinus node dysfunction - he is asymptomatic, s/p PPM insertion. 2. PPM -his medtronic DDD PM is working normally.  3. HTN - his bp is well controlled.   Tequisha Maahs,MD

## 2020-03-07 NOTE — Addendum Note (Signed)
Addended by: Jamarie Joplin H on: 03/07/2020 04:15 PM   Modules accepted: Orders  

## 2020-04-13 ENCOUNTER — Other Ambulatory Visit: Payer: Self-pay | Admitting: Internal Medicine

## 2020-05-23 ENCOUNTER — Ambulatory Visit (INDEPENDENT_AMBULATORY_CARE_PROVIDER_SITE_OTHER): Payer: Medicare Other

## 2020-05-23 DIAGNOSIS — I495 Sick sinus syndrome: Secondary | ICD-10-CM

## 2020-05-25 ENCOUNTER — Telehealth: Payer: Self-pay | Admitting: Emergency Medicine

## 2020-05-25 LAB — CUP PACEART REMOTE DEVICE CHECK
Battery Impedance: 5226 Ohm
Battery Remaining Longevity: 9 mo
Battery Voltage: 2.65 V
Brady Statistic AP VP Percent: 4 %
Brady Statistic AP VS Percent: 89 %
Brady Statistic AS VP Percent: 0 %
Brady Statistic AS VS Percent: 7 %
Date Time Interrogation Session: 20220218085218
Implantable Lead Implant Date: 20110513
Implantable Lead Implant Date: 20110513
Implantable Lead Location: 753859
Implantable Lead Location: 753860
Implantable Lead Model: 5092
Implantable Lead Model: 5594
Implantable Pulse Generator Implant Date: 20110513
Lead Channel Impedance Value: 445 Ohm
Lead Channel Impedance Value: 658 Ohm
Lead Channel Pacing Threshold Amplitude: 0.5 V
Lead Channel Pacing Threshold Amplitude: 0.5 V
Lead Channel Pacing Threshold Pulse Width: 0.4 ms
Lead Channel Pacing Threshold Pulse Width: 0.4 ms
Lead Channel Setting Pacing Amplitude: 1.5 V
Lead Channel Setting Pacing Amplitude: 2 V
Lead Channel Setting Pacing Pulse Width: 0.46 ms
Lead Channel Setting Sensing Sensitivity: 2.8 mV

## 2020-05-25 NOTE — Telephone Encounter (Signed)
Patient scheduled for monthly battery checks. Remote received on 05/25/2020 showed 9 months until RRT.

## 2020-05-30 NOTE — Progress Notes (Signed)
Remote pacemaker transmission.   

## 2020-07-23 ENCOUNTER — Ambulatory Visit (INDEPENDENT_AMBULATORY_CARE_PROVIDER_SITE_OTHER): Payer: Medicare Other

## 2020-07-23 DIAGNOSIS — I495 Sick sinus syndrome: Secondary | ICD-10-CM

## 2020-07-25 LAB — CUP PACEART REMOTE DEVICE CHECK
Battery Impedance: 6542 Ohm
Battery Remaining Longevity: 4 mo
Battery Voltage: 2.6 V
Brady Statistic AP VP Percent: 4 %
Brady Statistic AP VS Percent: 90 %
Brady Statistic AS VP Percent: 0 %
Brady Statistic AS VS Percent: 6 %
Date Time Interrogation Session: 20220419155112
Implantable Lead Implant Date: 20110513
Implantable Lead Implant Date: 20110513
Implantable Lead Location: 753859
Implantable Lead Location: 753860
Implantable Lead Model: 5092
Implantable Lead Model: 5594
Implantable Pulse Generator Implant Date: 20110513
Lead Channel Impedance Value: 434 Ohm
Lead Channel Impedance Value: 613 Ohm
Lead Channel Pacing Threshold Amplitude: 0.375 V
Lead Channel Pacing Threshold Amplitude: 0.5 V
Lead Channel Pacing Threshold Pulse Width: 0.4 ms
Lead Channel Pacing Threshold Pulse Width: 0.4 ms
Lead Channel Setting Pacing Amplitude: 1.5 V
Lead Channel Setting Pacing Amplitude: 2 V
Lead Channel Setting Pacing Pulse Width: 0.4 ms
Lead Channel Setting Sensing Sensitivity: 2.8 mV

## 2020-08-09 NOTE — Addendum Note (Signed)
Addended by: Elease Etienne A on: 08/09/2020 10:49 AM   Modules accepted: Level of Service

## 2020-08-09 NOTE — Progress Notes (Signed)
Remote pacemaker transmission.   

## 2020-12-14 ENCOUNTER — Encounter (HOSPITAL_COMMUNITY): Payer: Self-pay | Admitting: Emergency Medicine

## 2020-12-14 ENCOUNTER — Emergency Department (HOSPITAL_COMMUNITY): Payer: Medicare Other

## 2020-12-14 ENCOUNTER — Other Ambulatory Visit: Payer: Self-pay

## 2020-12-14 ENCOUNTER — Inpatient Hospital Stay (HOSPITAL_COMMUNITY)
Admission: EM | Admit: 2020-12-14 | Discharge: 2020-12-16 | DRG: 871 | Disposition: A | Discharge status: Home Health | Payer: Medicare Other | Attending: Family Medicine | Admitting: Family Medicine

## 2020-12-14 DIAGNOSIS — E119 Type 2 diabetes mellitus without complications: Secondary | ICD-10-CM | POA: Diagnosis present

## 2020-12-14 DIAGNOSIS — Z7982 Long term (current) use of aspirin: Secondary | ICD-10-CM

## 2020-12-14 DIAGNOSIS — J690 Pneumonitis due to inhalation of food and vomit: Secondary | ICD-10-CM | POA: Diagnosis present

## 2020-12-14 DIAGNOSIS — Z87891 Personal history of nicotine dependence: Secondary | ICD-10-CM

## 2020-12-14 DIAGNOSIS — Z20822 Contact with and (suspected) exposure to covid-19: Secondary | ICD-10-CM | POA: Diagnosis present

## 2020-12-14 DIAGNOSIS — Z8249 Family history of ischemic heart disease and other diseases of the circulatory system: Secondary | ICD-10-CM

## 2020-12-14 DIAGNOSIS — A419 Sepsis, unspecified organism: Secondary | ICD-10-CM | POA: Diagnosis not present

## 2020-12-14 DIAGNOSIS — K529 Noninfective gastroenteritis and colitis, unspecified: Secondary | ICD-10-CM

## 2020-12-14 DIAGNOSIS — Z950 Presence of cardiac pacemaker: Secondary | ICD-10-CM | POA: Diagnosis present

## 2020-12-14 DIAGNOSIS — R0689 Other abnormalities of breathing: Secondary | ICD-10-CM

## 2020-12-14 DIAGNOSIS — G4700 Insomnia, unspecified: Secondary | ICD-10-CM | POA: Diagnosis present

## 2020-12-14 DIAGNOSIS — N400 Enlarged prostate without lower urinary tract symptoms: Secondary | ICD-10-CM | POA: Diagnosis present

## 2020-12-14 DIAGNOSIS — D649 Anemia, unspecified: Secondary | ICD-10-CM | POA: Diagnosis present

## 2020-12-14 DIAGNOSIS — Z880 Allergy status to penicillin: Secondary | ICD-10-CM

## 2020-12-14 DIAGNOSIS — Z79899 Other long term (current) drug therapy: Secondary | ICD-10-CM

## 2020-12-14 DIAGNOSIS — N179 Acute kidney failure, unspecified: Secondary | ICD-10-CM

## 2020-12-14 DIAGNOSIS — E785 Hyperlipidemia, unspecified: Secondary | ICD-10-CM | POA: Diagnosis present

## 2020-12-14 DIAGNOSIS — I495 Sick sinus syndrome: Secondary | ICD-10-CM | POA: Diagnosis present

## 2020-12-14 DIAGNOSIS — R0600 Dyspnea, unspecified: Secondary | ICD-10-CM

## 2020-12-14 DIAGNOSIS — I459 Conduction disorder, unspecified: Secondary | ICD-10-CM | POA: Diagnosis present

## 2020-12-14 DIAGNOSIS — E875 Hyperkalemia: Secondary | ICD-10-CM | POA: Diagnosis present

## 2020-12-14 DIAGNOSIS — Z7984 Long term (current) use of oral hypoglycemic drugs: Secondary | ICD-10-CM

## 2020-12-14 DIAGNOSIS — I4891 Unspecified atrial fibrillation: Secondary | ICD-10-CM | POA: Diagnosis present

## 2020-12-14 DIAGNOSIS — Z23 Encounter for immunization: Secondary | ICD-10-CM

## 2020-12-14 DIAGNOSIS — E872 Acidosis: Secondary | ICD-10-CM | POA: Diagnosis present

## 2020-12-14 DIAGNOSIS — Z96642 Presence of left artificial hip joint: Secondary | ICD-10-CM | POA: Diagnosis present

## 2020-12-14 DIAGNOSIS — I10 Essential (primary) hypertension: Secondary | ICD-10-CM | POA: Diagnosis present

## 2020-12-14 LAB — CBC WITH DIFFERENTIAL/PLATELET
Band Neutrophils: 4 %
Basophils Absolute: 0 10*3/uL (ref 0.0–0.1)
Basophils Relative: 0 %
Eosinophils Absolute: 0 10*3/uL (ref 0.0–0.5)
Eosinophils Relative: 0 %
HCT: 44.4 % (ref 39.0–52.0)
Hemoglobin: 15.2 g/dL (ref 13.0–17.0)
Lymphocytes Relative: 7 %
Lymphs Abs: 1.3 10*3/uL (ref 0.7–4.0)
MCH: 30.8 pg (ref 26.0–34.0)
MCHC: 34.2 g/dL (ref 30.0–36.0)
MCV: 90.1 fL (ref 80.0–100.0)
Monocytes Absolute: 1 10*3/uL (ref 0.1–1.0)
Monocytes Relative: 5 %
Neutro Abs: 16.7 10*3/uL — ABNORMAL HIGH (ref 1.7–7.7)
Neutrophils Relative %: 84 %
Platelets: 216 10*3/uL (ref 150–400)
RBC: 4.93 MIL/uL (ref 4.22–5.81)
RDW: 15.9 % — ABNORMAL HIGH (ref 11.5–15.5)
WBC: 19 10*3/uL — ABNORMAL HIGH (ref 4.0–10.5)
nRBC: 0 % (ref 0.0–0.2)

## 2020-12-14 LAB — COMPREHENSIVE METABOLIC PANEL
ALT: 19 U/L (ref 0–44)
AST: 25 U/L (ref 15–41)
Albumin: 3.7 g/dL (ref 3.5–5.0)
Alkaline Phosphatase: 65 U/L (ref 38–126)
Anion gap: 12 (ref 5–15)
BUN: 79 mg/dL — ABNORMAL HIGH (ref 8–23)
CO2: 19 mmol/L — ABNORMAL LOW (ref 22–32)
Calcium: 8.7 mg/dL — ABNORMAL LOW (ref 8.9–10.3)
Chloride: 106 mmol/L (ref 98–111)
Creatinine, Ser: 1.81 mg/dL — ABNORMAL HIGH (ref 0.61–1.24)
GFR, Estimated: 34 mL/min — ABNORMAL LOW (ref >60)
Glucose, Bld: 183 mg/dL — ABNORMAL HIGH (ref 70–99)
Potassium: 5.2 mmol/L — ABNORMAL HIGH (ref 3.5–5.1)
Sodium: 137 mmol/L (ref 135–145)
Total Bilirubin: 1.1 mg/dL (ref 0.3–1.2)
Total Protein: 6.4 g/dL — ABNORMAL LOW (ref 6.5–8.1)

## 2020-12-14 LAB — CBG MONITORING, ED: Glucose-Capillary: 140 mg/dL — ABNORMAL HIGH (ref 70–99)

## 2020-12-14 LAB — HEMOGLOBIN A1C
Hgb A1c MFr Bld: 6.6 % — ABNORMAL HIGH (ref 4.8–5.6)
Mean Plasma Glucose: 142.72 mg/dL

## 2020-12-14 LAB — RESP PANEL BY RT-PCR (FLU A&B, COVID) ARPGX2
Influenza A by PCR: NEGATIVE
Influenza B by PCR: NEGATIVE
SARS Coronavirus 2 by RT PCR: NEGATIVE

## 2020-12-14 LAB — TROPONIN I (HIGH SENSITIVITY)
Troponin I (High Sensitivity): 22 ng/L — ABNORMAL HIGH (ref <18)
Troponin I (High Sensitivity): 23 ng/L — ABNORMAL HIGH (ref <18)

## 2020-12-14 LAB — LIPASE, BLOOD: Lipase: 17 U/L (ref 11–51)

## 2020-12-14 MED ORDER — CARVEDILOL 3.125 MG PO TABS
6.2500 mg | ORAL_TABLET | Freq: Two times a day (BID) | ORAL | Status: DC
  Administered 2020-12-14 – 2020-12-16 (×4): 6.25 mg via ORAL
  Filled 2020-12-14 (×4): qty 2

## 2020-12-14 MED ORDER — SODIUM CHLORIDE 0.9 % IV BOLUS
1000.0000 mL | Freq: Once | INTRAVENOUS | Status: AC
  Administered 2020-12-14: 1000 mL via INTRAVENOUS

## 2020-12-14 MED ORDER — BISACODYL 10 MG RE SUPP: 10.0000 mg | Freq: Every day | RECTAL | Status: DC | PRN

## 2020-12-14 MED ORDER — TRAZODONE HCL 50 MG PO TABS: 50.0000 mg | ORAL_TABLET | Freq: Every evening | ORAL | Status: DC | PRN

## 2020-12-14 MED ORDER — POLYETHYLENE GLYCOL 3350 17 G PO PACK: 17.0000 g | PACK | Freq: Every day | ORAL | Status: DC | PRN

## 2020-12-14 MED ORDER — METRONIDAZOLE 500 MG/100ML IV SOLN
500.0000 mg | Freq: Two times a day (BID) | INTRAVENOUS | Status: DC
  Administered 2020-12-14 – 2020-12-16 (×4): 500 mg via INTRAVENOUS
  Filled 2020-12-14 (×4): qty 100

## 2020-12-14 MED ORDER — SODIUM CHLORIDE 0.9% FLUSH
3.0000 mL | Freq: Two times a day (BID) | INTRAVENOUS | Status: DC
  Administered 2020-12-15 – 2020-12-16 (×3): 3 mL via INTRAVENOUS

## 2020-12-14 MED ORDER — DICYCLOMINE HCL 10 MG/ML IM SOLN
10.0000 mg | Freq: Once | INTRAMUSCULAR | Status: AC
  Administered 2020-12-14: 10 mg via INTRAMUSCULAR
  Filled 2020-12-14: qty 2

## 2020-12-14 MED ORDER — ACETAMINOPHEN 650 MG RE SUPP: 650.0000 mg | Freq: Four times a day (QID) | RECTAL | Status: DC | PRN

## 2020-12-14 MED ORDER — SODIUM CHLORIDE 0.9% FLUSH: 3.0000 mL | INTRAVENOUS | Status: DC | PRN

## 2020-12-14 MED ORDER — SODIUM BICARBONATE 8.4 % IV SOLN
INTRAVENOUS | Status: AC
  Filled 2020-12-14: qty 100

## 2020-12-14 MED ORDER — ONDANSETRON HCL 4 MG PO TABS: 4.0000 mg | ORAL_TABLET | Freq: Four times a day (QID) | ORAL | Status: DC | PRN

## 2020-12-14 MED ORDER — IOHEXOL 350 MG/ML SOLN
60.0000 mL | Freq: Once | INTRAVENOUS | Status: AC | PRN
  Administered 2020-12-14: 60 mL via INTRAVENOUS

## 2020-12-14 MED ORDER — ACETAMINOPHEN 325 MG PO TABS: 650.0000 mg | ORAL_TABLET | Freq: Four times a day (QID) | ORAL | Status: DC | PRN

## 2020-12-14 MED ORDER — CALCIUM CARBONATE 1250 (500 CA) MG PO TABS
1.0000 | ORAL_TABLET | Freq: Two times a day (BID) | ORAL | Status: DC
  Administered 2020-12-15 – 2020-12-16 (×3): 500 mg via ORAL
  Filled 2020-12-14 (×8): qty 1

## 2020-12-14 MED ORDER — AMLODIPINE BESYLATE 5 MG PO TABS
10.0000 mg | ORAL_TABLET | Freq: Every day | ORAL | Status: DC
  Administered 2020-12-14 – 2020-12-16 (×3): 10 mg via ORAL
  Filled 2020-12-14 (×3): qty 2

## 2020-12-14 MED ORDER — SODIUM CHLORIDE 0.45 % IV SOLN
INTRAVENOUS | Status: DC
  Filled 2020-12-14 (×7): qty 75

## 2020-12-14 MED ORDER — INSULIN ASPART 100 UNIT/ML IJ SOLN: 0.0000 [IU] | Freq: Every day | INTRAMUSCULAR | Status: DC

## 2020-12-14 MED ORDER — ONDANSETRON HCL 4 MG/2ML IJ SOLN: 4.0000 mg | Freq: Four times a day (QID) | INTRAMUSCULAR | Status: DC | PRN

## 2020-12-14 MED ORDER — HEPARIN SODIUM (PORCINE) 5000 UNIT/ML IJ SOLN
5000.0000 [IU] | Freq: Three times a day (TID) | INTRAMUSCULAR | Status: DC
  Administered 2020-12-14 – 2020-12-16 (×4): 5000 [IU] via SUBCUTANEOUS
  Filled 2020-12-14 (×5): qty 1

## 2020-12-14 MED ORDER — SODIUM CHLORIDE 0.9 % IV SOLN: 250.0000 mL | INTRAVENOUS | Status: DC | PRN

## 2020-12-14 MED ORDER — ASPIRIN EC 81 MG PO TBEC
81.0000 mg | DELAYED_RELEASE_TABLET | Freq: Every day | ORAL | Status: DC
  Administered 2020-12-14 – 2020-12-16 (×3): 81 mg via ORAL
  Filled 2020-12-14 (×3): qty 1

## 2020-12-14 MED ORDER — INSULIN ASPART 100 UNIT/ML IJ SOLN: 0.0000 [IU] | Freq: Three times a day (TID) | INTRAMUSCULAR | Status: DC

## 2020-12-14 MED ORDER — VITAMIN D 25 MCG (1000 UNIT) PO TABS
5000.0000 [IU] | ORAL_TABLET | Freq: Every day | ORAL | Status: DC
  Administered 2020-12-14 – 2020-12-16 (×3): 5000 [IU] via ORAL
  Filled 2020-12-14 (×7): qty 5

## 2020-12-14 MED ORDER — SODIUM CHLORIDE 0.9 % IV SOLN
2.0000 g | INTRAVENOUS | Status: DC
  Administered 2020-12-14 – 2020-12-15 (×2): 2 g via INTRAVENOUS
  Filled 2020-12-14 (×2): qty 20

## 2020-12-14 MED ORDER — CIPROFLOXACIN IN D5W 400 MG/200ML IV SOLN
400.0000 mg | Freq: Once | INTRAVENOUS | Status: DC
  Filled 2020-12-14: qty 200

## 2020-12-14 MED ORDER — SODIUM CHLORIDE 0.9 % IV BOLUS
500.0000 mL | Freq: Once | INTRAVENOUS | Status: AC
  Administered 2020-12-14: 500 mL via INTRAVENOUS

## 2020-12-14 MED ORDER — SODIUM CHLORIDE 0.9% FLUSH
3.0000 mL | Freq: Two times a day (BID) | INTRAVENOUS | Status: DC
  Administered 2020-12-15: 3 mL via INTRAVENOUS

## 2020-12-14 MED ORDER — TAMSULOSIN HCL 0.4 MG PO CAPS
0.4000 mg | ORAL_CAPSULE | Freq: Every day | ORAL | Status: DC
  Administered 2020-12-14 – 2020-12-16 (×3): 0.4 mg via ORAL
  Filled 2020-12-14 (×3): qty 1

## 2020-12-14 MED ORDER — ASCORBIC ACID 500 MG PO TABS
1000.0000 mg | ORAL_TABLET | Freq: Every day | ORAL | Status: DC
  Administered 2020-12-14 – 2020-12-16 (×3): 1000 mg via ORAL
  Filled 2020-12-14 (×3): qty 2

## 2020-12-14 MED ORDER — METRONIDAZOLE 500 MG PO TABS: 500.0000 mg | ORAL_TABLET | Freq: Once | ORAL | Status: DC

## 2020-12-14 NOTE — ED Notes (Signed)
AC aware pixis has the wrong cipro in there.

## 2020-12-14 NOTE — H&P (Signed)
Patient Demographics:    Nathan Alvarez, is a 87 y.o. male  MRN: [id number]   DOB - Aug 27, 1924  Admit Date - 12/14/2020  Outpatient Primary MD for the patient is Corrington, Kip A, MD   Assessment & Plan:    Principal Problem:   AKI (acute kidney injury) (HCC) Active Problems:   Enterocolitis   Hypertension   Diabetes (HCC)   Anemia   Sinus node dysfunction (HCC)   Pacemaker   1)AKI----acute kidney injury with non-anion gap metabolic acidosis and hyperkalemia with potassium of 5.2 -Creatinine is up to 1.81 from a baseline usually around 0.9, BUN is up to 79 from baseline usually around 26, bicarb is 19, anion gap is 12  --Renally adjust medications, avoid nephrotoxic agents / dehydration  / hypotension -IV fluids with bicarb supplementation   2)Enterocolitis--- WBCs 19,  -CT AP--shows Segmental wall thickening of the distal rectosigmoid colon, which may reflect inflammatory or infectious colitis,  Mild gaseous distension of the proximal colon and distal small bowel, which could reflect an element of ileus. No high-grade bowel obstruction. -IV Rocephin and Flagyl as ordered -Stool for C. difficile and stool pathogen culture/pathogen requested -May use Imodium if stool for C. difficile negative  3) history of chronic anemia with baseline hemoglobin usually around 10 -hemoglobin is up to 15 from a baseline usually around 10 suggesting hemoconcentration -PTA patient was on iron supplementation -Anticipating H&H will drop post hydration  4)DM2-  -Hold Januvia until oral intake is more reliable Use Novolog/Humalog Sliding scale insulin with Accu-Cheks/Fingersticks as ordered   5)HTN--- okay to restart Coreg and amlodipine  6)BPH--continue Flomax  7)History of sick sinus syndrome/status post pacemaker  placement, continue Coreg  Disposition/Need for in-Hospital Stay- patient unable to be discharged at this time due to AKI with non-anion gap metabolic acidosis in the setting of diarrhea secondary to enterocolitis requiring IV antibiotics and IV fluids*  Dispo: The patient is from: Home              Anticipated d/c is to:  TBD HOME with HH Vs SNF              Anticipated d/c date is: 2 days              Patient currently is not medically stable to d/c. Barriers: Not Clinically Stable-    With History of - Reviewed by me  Past Medical History:  Diagnosis Date   A-fib (HCC)    Dizziness    DM (diabetes mellitus) (HCC)    Esophagitis, reflux    Heart block    Hyperlipidemia    Hypertension, essential, benign    Memory deficit    Pathological fracture of left hip due to age-related osteoporosis (HCC) 04/25/2019   SOB (shortness of breath)    SSS (sick sinus syndrome) (HCC)       Past Surgical History:  Procedure Laterality Date   CATARACT EXTRACTION Right  HIP ARTHROPLASTY Left 04/22/2019   Procedure: ARTHROPLASTY BIPOLAR HIP (HEMIARTHROPLASTY);  Surgeon: Myrene Galas, MD;  Location: [facility] OR;  Service: Orthopedics;  Laterality: Left;   INGUINAL HERNIA REPAIR     INSERT / REPLACE / REMOVE PACEMAKER     PACEMAKER INSERTION     TONSILLECTOMY AND ADENOIDECTOMY      Chief Complaint  Patient presents with   Weakness   Diarrhea      HPI:    Nathan Alvarez  is a 87 y.o. male with pmhx relevant for DM2 and HTN and BPH presents with generalized weakness, fevers , abdominal pain and diarrhea for 2 days -Patient lives independently typically, he was very weak and was found by daughter covered in feces today - -Lipase is 17 -Troponin is 22, repeat troponin is 23, EKG sinus rhythm without acute ST changes -WBCs 19.0 hemoglobin is up to 15 from a baseline usually around 10 suggesting hemoconcentration -Creatinine is up to 1.81 from a baseline usually around 0.9, BUN is up to 79  from baseline usually around 26, bicarb is 19, anion gap is 12  CT AP--shows Segmental wall thickening of the distal rectosigmoid colon, which may reflect inflammatory or infectious colitis,  Mild gaseous distension of the proximal colon and distal small bowel, which could reflect an element of ileus. No high-grade bowel obstruction.  -No frank chest pains or shortness of breath -   Review of systems:    In addition to the HPI above,   A full Review of  Systems was done, all other systems reviewed are negative except as noted above in HPI , .    Social History:  Reviewed by me    Social History   Tobacco Use   Smoking status: Former    Types: Cigarettes    Quit date: 01/01/1956    Years since quitting: 64.9   Smokeless tobacco: Never  Substance Use Topics   Alcohol use: No       Family History :  Reviewed by me    Family History  Problem Relation Age of Onset   Heart disease Mother      Home Medications:   Prior to Admission medications   Medication Sig Start Date End Date Taking? Authorizing Provider  acetaminophen (TYLENOL) 500 MG tablet Take 1 tablet (500 mg total) by mouth every 8 (eight) hours. 04/25/19  Yes Montez Morita, PA-C  amLODipine (NORVASC) 10 MG tablet Take 1 tablet (10 mg total) by mouth daily. 04/27/19 12/14/20 Yes Ikramullah, Mir Mohammed, MD  ascorbic acid (VITAMIN C) 500 MG tablet Take 1 tablet (500 mg total) by mouth daily. Patient taking differently: Take 1,000 mg by mouth daily. 04/26/19  Yes Montez Morita, PA-C  aspirin 81 MG chewable tablet Chew 81 mg by mouth daily.   Yes [provider]  carvedilol (COREG) 6.25 MG tablet TAKE 1 TABLET BY MOUTH TWICE DAILY 04/13/20  Yes Marinus Maw, MD  diclofenac Sodium (VOLTAREN) 1 % GEL Apply 4 g topically every 8 (eight) hours as needed (pain). 09/17/20  Yes [provider]  Ferrous Sulfate (IRON) 325 (65 Fe) MG TABS Take 28 mg by mouth daily.   Yes [provider]  JANUVIA 100 MG  tablet Take 100 mg by mouth daily. 11/01/20  Yes [provider]  polyethylene glycol powder (GLYCOLAX/MIRALAX) 17 GM/SCOOP powder Take by mouth.   Yes [provider]  tamsulosin (FLOMAX) 0.4 MG CAPS capsule Take 0.4 mg by mouth daily. 01/28/19  Yes [provider]  aspirin EC 81 MG tablet Take 81 mg by mouth daily. Patient not taking: No sig reported    [provider]  calcium carbonate (OS-CAL - DOSED IN MG OF ELEMENTAL CALCIUM) 1250 (500 Ca) MG tablet Take 1 tablet (500 mg of elemental calcium total) by mouth 2 (two) times daily with a meal. Patient not taking: No sig reported 04/25/19   Montez Morita, PA-C  Cholecalciferol (VITAMIN D) 125 MCG (5000 UT) CAPS Take 1 capsule by mouth daily. Patient not taking: No sig reported 04/25/19   Montez Morita, PA-C  JANUVIA 50 MG tablet Take 50 mg by mouth daily. Patient not taking: No sig reported 04/09/19   [provider]     Allergies:     Allergies  Allergen Reactions   Penicillins     unknown     Physical Exam:   Vitals  Blood pressure (!) 143/70, pulse 87, temperature 97.6 F (36.4 C), temperature source Oral, resp. rate (!) 37, height 5\' 6"  (1.676 m), weight 64.9 kg, SpO2 96 %.  Physical Examination: General appearance - alert, and in no distress  Mental status - alert, oriented to person, place, and time,  Eyes - sclera anicteric Neck - supple, no JVD elevation , Chest - clear  to auscultation bilaterally, symmetrical air movement,  Heart - S1 and S2 normal, regular  Abdomen - soft, nontender, nondistended, no masses or organomegaly Neurological - screening mental status exam normal, neck supple without rigidity, cranial nerves II through XII intact, DTR's normal and symmetric Extremities - no pedal edema noted, intact peripheral pulses  Skin - warm, dry, decreased skin turgor and dry oral mucosa      Data Review:    CBC Recent Labs  Lab 12/14/20 1432  WBC 19.0*  HGB 15.2  HCT  44.4  PLT 216  MCV 90.1  MCH 30.8  MCHC 34.2  RDW 15.9*  LYMPHSABS 1.3  MONOABS 1.0  EOSABS 0.0  BASOSABS 0.0   ------------------------------------------------------------------------------------------------------------------  Chemistries  Recent Labs  Lab 12/14/20 1432  NA 137  K 5.2*  CL 106  CO2 19*  GLUCOSE 183*  BUN 79*  CREATININE 1.81*  CALCIUM 8.7*  AST 25  ALT 19  ALKPHOS 65  BILITOT 1.1   ------------------------------------------------------------------------------------------------------------------ estimated creatinine clearance is 23 mL/min (A) (by C-G formula based on SCr of 1.81 mg/dL (H)). ------------------------------------------------------------------------------------------------------------------ No results for input(s): TSH, T4TOTAL, T3FREE, THYROIDAB in the last 72 hours.  Invalid input(s): FREET3   Coagulation profile No results for input(s): INR, PROTIME in the last 168 hours. ------------------------------------------------------------------------------------------------------------------- No results for input(s): DDIMER in the last 72 hours. -------------------------------------------------------------------------------------------------------------------  Cardiac Enzymes No results for input(s): CKMB, TROPONINI, MYOGLOBIN in the last 168 hours.  Invalid input(s): CK ------------------------------------------------------------------------------------------------------------------ No results found for: BNP   ---------------------------------------------------------------------------------------------------------------  Urinalysis No results found for: COLORURINE, APPEARANCEUR, LABSPEC, PHURINE, GLUCOSEU, HGBUR, BILIRUBINUR, KETONESUR, PROTEINUR, UROBILINOGEN, NITRITE, LEUKOCYTESUR  ----------------------------------------------------------------------------------------------------------------   Imaging Results:    CT ABDOMEN  PELVIS W CONTRAST  Result Date: 12/14/2020 CLINICAL DATA:  Generalized abdominal pain, weakness, diarrhea for 2 days EXAM: CT ABDOMEN AND PELVIS WITH CONTRAST TECHNIQUE: Multidetector CT imaging of the abdomen and pelvis was performed using the standard protocol following bolus administration of intravenous contrast. CONTRAST:  84mL OMNIPAQUE IOHEXOL 350 MG/ML SOLN COMPARISON:  None. FINDINGS: Lower chest: No acute pleural or parenchymal lung disease. The heart is enlarged with trace pericardial fluid. Multi lead pacer is noted. Hepatobiliary: No focal liver abnormality is seen. No gallstones, gallbladder  wall thickening, or biliary dilatation. Pancreas: There is diffuse pancreatic atrophy, with innumerable small pancreatic cystic lesions measuring up to 10 mm in size, reference image 27/2. Spleen: Normal in size without focal abnormality. Adrenals/Urinary Tract: Upper pole left renal cyst. Otherwise the kidneys enhance normally and symmetrically. No urinary tract calculi or obstructive uropathy. Mild nonspecific adrenal thickening.  The bladder is unremarkable. Stomach/Bowel: There is segmental wall thickening of the rectosigmoid colon which could reflect inflammatory or infectious colitis. Mild nonspecific distension of the distal small bowel and proximal colon may reflect an element of ileus. No high-grade obstruction. The stomach is decompressed. Vascular/Lymphatic: There is extensive atherosclerosis throughout the aorta and its branches. No aneurysm or dissection. No pathologic adenopathy within the abdomen or pelvis. Reproductive: Prostate is not enlarged. Evaluation is limited due to streak artifact from a left hip arthroplasty. Other: Small volume ascites is seen primarily within the left upper quadrant surrounding the spleen, nonspecific. No free intraperitoneal gas. No abdominal wall hernia. Musculoskeletal: Unremarkable left hip arthroplasty. No acute or destructive bony lesions. Reconstructed images  demonstrate no additional findings. IMPRESSION: 1. Segmental wall thickening of the distal rectosigmoid colon, which may reflect inflammatory or infectious colitis. 2. Mild gaseous distension of the proximal colon and distal small bowel, which could reflect an element of ileus. No high-grade bowel obstruction. 3. Nonspecific free fluid left upper quadrant abdomen. 4. Numerous small pancreatic cysts, largest measuring 10 mm. Recommend follow up pre and post contrast MRI/MRCP or pancreatic protocol CT in 2 years. This recommendation follows ACR consensus guidelines: Management of Incidental Pancreatic Cysts: A White Paper of the ACR Incidental Findings Committee. J Am Coll Radiol 2017;14:911-923. 5. Aortic Atherosclerosis (ICD10-I70.0). Coronary artery atherosclerosis. Electronically Signed   By: Sharlet SalinaMichael  Brown M.D.   On: 12/14/2020 16:53    Radiological Exams on Admission: CT ABDOMEN PELVIS W CONTRAST  Result Date: 12/14/2020 CLINICAL DATA:  Generalized abdominal pain, weakness, diarrhea for 2 days EXAM: CT ABDOMEN AND PELVIS WITH CONTRAST TECHNIQUE: Multidetector CT imaging of the abdomen and pelvis was performed using the standard protocol following bolus administration of intravenous contrast. CONTRAST:  60mL OMNIPAQUE IOHEXOL 350 MG/ML SOLN COMPARISON:  None. FINDINGS: Lower chest: No acute pleural or parenchymal lung disease. The heart is enlarged with trace pericardial fluid. Multi lead pacer is noted. Hepatobiliary: No focal liver abnormality is seen. No gallstones, gallbladder wall thickening, or biliary dilatation. Pancreas: There is diffuse pancreatic atrophy, with innumerable small pancreatic cystic lesions measuring up to 10 mm in size, reference image 27/2. Spleen: Normal in size without focal abnormality. Adrenals/Urinary Tract: Upper pole left renal cyst. Otherwise the kidneys enhance normally and symmetrically. No urinary tract calculi or obstructive uropathy. Mild nonspecific adrenal thickening.   The bladder is unremarkable. Stomach/Bowel: There is segmental wall thickening of the rectosigmoid colon which could reflect inflammatory or infectious colitis. Mild nonspecific distension of the distal small bowel and proximal colon may reflect an element of ileus. No high-grade obstruction. The stomach is decompressed. Vascular/Lymphatic: There is extensive atherosclerosis throughout the aorta and its branches. No aneurysm or dissection. No pathologic adenopathy within the abdomen or pelvis. Reproductive: Prostate is not enlarged. Evaluation is limited due to streak artifact from a left hip arthroplasty. Other: Small volume ascites is seen primarily within the left upper quadrant surrounding the spleen, nonspecific. No free intraperitoneal gas. No abdominal wall hernia. Musculoskeletal: Unremarkable left hip arthroplasty. No acute or destructive bony lesions. Reconstructed images demonstrate no additional findings. IMPRESSION: 1. Segmental wall thickening of the distal rectosigmoid  colon, which may reflect inflammatory or infectious colitis. 2. Mild gaseous distension of the proximal colon and distal small bowel, which could reflect an element of ileus. No high-grade bowel obstruction. 3. Nonspecific free fluid left upper quadrant abdomen. 4. Numerous small pancreatic cysts, largest measuring 10 mm. Recommend follow up pre and post contrast MRI/MRCP or pancreatic protocol CT in 2 years. This recommendation follows ACR consensus guidelines: Management of Incidental Pancreatic Cysts: A White Paper of the ACR Incidental Findings Committee. J Am Coll Radiol 2017;14:911-923. 5. Aortic Atherosclerosis (ICD10-I70.0). Coronary artery atherosclerosis. Electronically Signed   By: Sharlet Salina M.D.   On: 12/14/2020 16:53    DVT Prophylaxis -SCD /heparin [place] AM Labs Ordered, also please review Full Orders  Family Communication: Admission, patients condition and plan of care including tests being ordered have been  discussed with the patient  who indicate understanding and agree with the plan   Code Status - Full Code  Likely DC to  home with [facility]   Condition   stable  Shon Hale M.D on 12/14/2020 at 6:25 PM Go to [email or web address] -  for contact info  [facility] - Office  [phone]

## 2020-12-14 NOTE — ED Provider Notes (Signed)
[facility] Provider Note   CSN: [id number] Arrival date & time: 12/14/20  1155     History Chief Complaint  Patient presents with   Weakness   Diarrhea    Nathan Alvarez is a 87 y.o. male.  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  Patient states this started yesterday, states the pain came on around midmorning, states the pain is in his lower abdomen, does not radiate, states he had a 1 episode of vomiting denies coffee-ground emesis or hematemesis, states has had multiple bouts of diarrhea, denies melena and/or hematochezia, denies  mucousy stools.  He has had no recent antibiotics, no recent stents in the hospital, has no history of C. difficile.  Patient has no significant abdominal history, no history of bowel obstructions, pancreatitis, or diverticulitis.  He has no associated fevers, chills, nasal ingestion, sore throat, cough, chest pain, shortness of breath, general body aches, denies any sick contacts, he is not immunocompromise.  He has no other complaints at this time.  Daughter was at bedside states that she found him in bed with stool covered all over the lower part of this body, he appeared to be very weak, notes unable to ambulate he normally does.  She states that he has not had any falls, states she is active normal self, she is concerned that he is extremely dehydrated.  Past Medical History:  Diagnosis Date   A-fib ([place])    Dizziness    DM (diabetes mellitus) (HCC)    Esophagitis, reflux    Heart block    Hyperlipidemia    Hypertension, essential, benign    Memory deficit    Pathological fracture of left hip due to age-related osteoporosis ([place]) 04/25/2019   SOB (shortness of breath)    SSS (sick sinus syndrome) [facility])     Patient Active Problem List   Diagnosis Date Noted    AKI (acute kidney injury) ([place]) 12/14/2020   Enterocolitis 12/14/2020   Sinus node dysfunction ([place]) 03/06/2020   Pacemaker 03/06/2020   Vitamin D deficiency 04/25/2019   Pathological fracture of left hip due to age-related osteoporosis ([place]) 04/25/2019   Femoral neck fracture ([place]) 04/21/2019   Hypertension 04/21/2019   Diabetes ([place]) 04/21/2019   Hyponatremia 04/21/2019   Anemia 04/21/2019    Past Surgical History:  Procedure Laterality Date   CATARACT EXTRACTION Right    HIP ARTHROPLASTY Left 04/22/2019   Procedure: ARTHROPLASTY BIPOLAR HIP (HEMIARTHROPLASTY);  Surgeon: Altamese Ironton, MD;  Location: [facility];  Service: Orthopedics;  Laterality: Left;   INGUINAL HERNIA REPAIR     INSERT / REPLACE / REMOVE PACEMAKER     PACEMAKER INSERTION     TONSILLECTOMY AND ADENOIDECTOMY         Family History  Problem Relation Age of Onset   Heart disease Mother     Social History   Tobacco Use   Smoking status: Former    Types: Cigarettes    Quit date: 01/01/1956    Years since quitting: 64.9   Smokeless tobacco: Never  Vaping Use   Vaping Use: Never used  Substance Use Topics   Alcohol use: No   Drug use: No    Home Medications Prior to Admission medications   Medication Sig Start Date End Date Taking? Authorizing Provider  acetaminophen (TYLENOL) 500 MG tablet Take 1 tablet (500 mg total) by mouth  every 8 (eight) hours. 04/25/19  Yes Ainsley Spinner, PA-C  amLODipine (NORVASC) 10 MG tablet Take 1 tablet (10 mg total) by mouth daily. 04/27/19 12/14/20 Yes Ikramullah, Mir Mohammed, MD  ascorbic acid (VITAMIN C) 500 MG tablet Take 1 tablet (500 mg total) by mouth daily. Patient taking differently: Take 1,000 mg by mouth daily. 04/26/19  Yes Ainsley Spinner, PA-C  aspirin 81 MG chewable tablet Chew 81 mg by mouth daily.   Yes [provider]  carvedilol (COREG) 6.25 MG tablet TAKE 1 TABLET BY MOUTH TWICE DAILY 04/13/20  Yes Evans Lance, MD  diclofenac Sodium (VOLTAREN) 1 % GEL  Apply 4 g topically every 8 (eight) hours as needed (pain). 09/17/20  Yes [provider]  Ferrous Sulfate (IRON) 325 (65 Fe) MG TABS Take 28 mg by mouth daily.   Yes [provider]  JANUVIA 100 MG tablet Take 100 mg by mouth daily. 11/01/20  Yes [provider]  polyethylene glycol powder (GLYCOLAX/MIRALAX) 17 GM/SCOOP powder Take by mouth.   Yes [provider]  tamsulosin (FLOMAX) 0.4 MG CAPS capsule Take 0.4 mg by mouth daily. 01/28/19  Yes [provider]  aspirin EC 81 MG tablet Take 81 mg by mouth daily. Patient not taking: No sig reported    [provider]  calcium carbonate (OS-CAL - DOSED IN MG OF ELEMENTAL CALCIUM) 1250 (500 Ca) MG tablet Take 1 tablet (500 mg of elemental calcium total) by mouth 2 (two) times daily with a meal. Patient not taking: No sig reported 04/25/19   Ainsley Spinner, PA-C  Cholecalciferol (VITAMIN D) 125 MCG (5000 UT) CAPS Take 1 capsule by mouth daily. Patient not taking: No sig reported 04/25/19   Ainsley Spinner, PA-C  JANUVIA 50 MG tablet Take 50 mg by mouth daily. Patient not taking: No sig reported 04/09/19   [provider]    Allergies    Penicillins  Review of Systems   Review of Systems  Constitutional:  Negative for chills and fever.  HENT:  Negative for congestion.   Respiratory:  Negative for shortness of breath.   Cardiovascular:  Negative for chest pain.  Gastrointestinal:  Positive for abdominal pain and diarrhea. Negative for nausea and vomiting.  Genitourinary:  Negative for enuresis.  Musculoskeletal:  Negative for back pain.  Skin:  Negative for rash.  Neurological:  Negative for dizziness.  Hematological:  Does not bruise/bleed easily.   Physical Exam Updated Vital Signs BP (!) 138/54   Pulse 87   Temp 97.6 F (36.4 C) (Oral)   Resp (!) 26   Ht 5' 6"  (1.676 m)   Wt 64.9 kg   SpO2 96%   BMI 23.08 kg/m   Physical Exam Vitals and nursing note reviewed.   Constitutional:      General: He is not in acute distress.    Appearance: He is not ill-appearing.  HENT:     Head: Normocephalic and atraumatic.     Comments: No deformities noted of the head, no raccoon eyes or battle sign present.    Nose: No congestion.     Mouth/Throat:     Mouth: Mucous membranes are dry.     Pharynx: Oropharynx is clear. No oropharyngeal exudate or posterior oropharyngeal erythema.  Eyes:     Extraocular Movements: Extraocular movements intact.     Conjunctiva/sclera: Conjunctivae normal.     Pupils: Pupils are equal, round, and reactive to light.  Cardiovascular:     Rate and Rhythm: Normal rate  and regular rhythm.     Pulses: Normal pulses.     Heart sounds: No murmur heard.   No friction rub. No gallop.  Pulmonary:     Effort: No respiratory distress.     Breath sounds: No wheezing, rhonchi or rales.  Abdominal:     Palpations: Abdomen is soft.     Tenderness: There is abdominal tenderness. There is no right CVA tenderness or left CVA tenderness.     Comments: Abdomen nondistended, normal bowel sounds, dull to percussion, tender to palpation in his lower abdomen, no guarding, rebound tenderness, peritoneal sign.  Musculoskeletal:     Right lower leg: No edema.     Left lower leg: No edema.     Comments: Patient is moving all 4 extremities without difficulty.  Skin:    General: Skin is warm and dry.  Neurological:     Mental Status: He is alert.     Comments: No facial asymmetry, no due to word finding, able to follow two-step commands, no unilateral weakness present.  Psychiatric:        Mood and Affect: Mood normal.    ED Results / Procedures / Treatments   Labs (all labs ordered are listed, but only abnormal results are displayed) Labs Reviewed  COMPREHENSIVE METABOLIC PANEL - Abnormal; Notable for the following components:      Result Value   Potassium 5.2 (*)    CO2 19 (*)    Glucose, Bld 183 (*)    BUN 79 (*)    Creatinine, Ser 1.81  (*)    Calcium 8.7 (*)    Total Protein 6.4 (*)    GFR, Estimated 34 (*)    All other components within normal limits  CBC WITH DIFFERENTIAL/PLATELET - Abnormal; Notable for the following components:   WBC 19.0 (*)    RDW 15.9 (*)    Neutro Abs 16.7 (*)    All other components within normal limits  TROPONIN I (HIGH SENSITIVITY) - Abnormal; Notable for the following components:   Troponin I (High Sensitivity) 22 (*)    All other components within normal limits  TROPONIN I (HIGH SENSITIVITY) - Abnormal; Notable for the following components:   Troponin I (High Sensitivity) 23 (*)    All other components within normal limits  CULTURE, BLOOD (ROUTINE X 2)  CULTURE, BLOOD (ROUTINE X 2)  C DIFFICILE QUICK SCREEN W PCR REFLEX    RESP PANEL BY RT-PCR (FLU A&B, COVID) ARPGX2  GASTROINTESTINAL PANEL BY PCR, STOOL (REPLACES STOOL CULTURE)  LIPASE, BLOOD  URINALYSIS, ROUTINE W REFLEX MICROSCOPIC  HEMOGLOBIN M0Q  BASIC METABOLIC PANEL  CBC    EKG EKG Interpretation  Date/Time:  Friday December 14 2020 14:22:38 EDT Ventricular Rate:  75 PR Interval:  197 QRS Duration: 166 QT Interval:  490 QTC Calculation: 533 R Axis:   -33 Text Interpretation: Sinus rhythm Atrial premature complexes in couplets Consider left atrial enlargement Nonspecific intraventricular conduction delay Probable anterior infarct, age indeterminate Lateral leads are also involved Confirmed by Milton Ferguson [id number]) on 12/14/2020 2:31:11 PM  Radiology CT ABDOMEN PELVIS W CONTRAST  Result Date: 12/14/2020 CLINICAL DATA:  Generalized abdominal pain, weakness, diarrhea for 2 days EXAM: CT ABDOMEN AND PELVIS WITH CONTRAST TECHNIQUE: Multidetector CT imaging of the abdomen and pelvis was performed using the standard protocol following bolus administration of intravenous contrast. CONTRAST:  11m OMNIPAQUE IOHEXOL 350 MG/ML SOLN COMPARISON:  None. FINDINGS: Lower chest: No acute pleural or parenchymal lung disease. The heart is  enlarged with trace pericardial fluid. Multi lead pacer is noted. Hepatobiliary: No focal liver abnormality is seen. No gallstones, gallbladder wall thickening, or biliary dilatation. Pancreas: There is diffuse pancreatic atrophy, with innumerable small pancreatic cystic lesions measuring up to 10 mm in size, reference image 27/2. Spleen: Normal in size without focal abnormality. Adrenals/Urinary Tract: Upper pole left renal cyst. Otherwise the kidneys enhance normally and symmetrically. No urinary tract calculi or obstructive uropathy. Mild nonspecific adrenal thickening.  The bladder is unremarkable. Stomach/Bowel: There is segmental wall thickening of the rectosigmoid colon which could reflect inflammatory or infectious colitis. Mild nonspecific distension of the distal small bowel and proximal colon may reflect an element of ileus. No high-grade obstruction. The stomach is decompressed. Vascular/Lymphatic: There is extensive atherosclerosis throughout the aorta and its branches. No aneurysm or dissection. No pathologic adenopathy within the abdomen or pelvis. Reproductive: Prostate is not enlarged. Evaluation is limited due to streak artifact from a left hip arthroplasty. Other: Small volume ascites is seen primarily within the left upper quadrant surrounding the spleen, nonspecific. No free intraperitoneal gas. No abdominal wall hernia. Musculoskeletal: Unremarkable left hip arthroplasty. No acute or destructive bony lesions. Reconstructed images demonstrate no additional findings. IMPRESSION: 1. Segmental wall thickening of the distal rectosigmoid colon, which may reflect inflammatory or infectious colitis. 2. Mild gaseous distension of the proximal colon and distal small bowel, which could reflect an element of ileus. No high-grade bowel obstruction. 3. Nonspecific free fluid left upper quadrant abdomen. 4. Numerous small pancreatic cysts, largest measuring 10 mm. Recommend follow up pre and post contrast  MRI/MRCP or pancreatic protocol CT in 2 years. This recommendation follows ACR consensus guidelines: Management of Incidental Pancreatic Cysts: A White Paper of the ACR Incidental Findings Committee. J Am Coll Radiol 0947;09:628-366. 5. Aortic Atherosclerosis (ICD10-I70.0). Coronary artery atherosclerosis. Electronically Signed   By: Randa Ngo M.D.   On: 12/14/2020 16:53   DG Chest Port 1 View  Result Date: 12/14/2020 CLINICAL DATA:  Weakness. EXAM: PORTABLE CHEST 1 VIEW COMPARISON:  Chest x-ray dated April 20, 2019. FINDINGS: Unchanged left chest wall pacemaker and mild cardiomegaly. Low lung volumes. No focal consolidation, pleural effusion, or pneumothorax. No acute osseous abnormality. Borderline dilated small bowel loops in the upper abdomen, please see separate CT abdomen pelvis report from same day. IMPRESSION: 1. No active disease. Electronically Signed   By: Titus Dubin M.D.   On: 12/14/2020 18:50    Procedures Procedures   Medications Ordered in ED Medications  sodium chloride 0.9 % bolus 500 mL (has no administration in time range)  sodium bicarbonate 75 mEq in sodium chloride 0.45 % 1,075 mL infusion (has no administration in time range)  cefTRIAXone (ROCEPHIN) 2 g in sodium chloride 0.9 % 100 mL IVPB (has no administration in time range)  metroNIDAZOLE (FLAGYL) IVPB 500 mg (has no administration in time range)  insulin aspart (novoLOG) injection 0-6 Units (has no administration in time range)  insulin aspart (novoLOG) injection 0-5 Units (has no administration in time range)  aspirin EC tablet 81 mg (has no administration in time range)  amLODipine (NORVASC) tablet 10 mg (has no administration in time range)  carvedilol (COREG) tablet 6.25 mg (has no administration in time range)  tamsulosin (FLOMAX) capsule 0.4 mg (has no administration in time range)  ascorbic acid (VITAMIN C) tablet 1,000 mg (has no administration in time range)  calcium carbonate (OS-CAL - dosed in mg  of elemental calcium) tablet 500 mg of elemental calcium (has no administration in  time range)  Vitamin D CAPS 1 capsule (has no administration in time range)  sodium chloride flush (NS) 0.9 % injection 3 mL (has no administration in time range)  sodium chloride flush (NS) 0.9 % injection 3 mL (has no administration in time range)  sodium chloride flush (NS) 0.9 % injection 3 mL (has no administration in time range)  0.9 %  sodium chloride infusion (has no administration in time range)  acetaminophen (TYLENOL) tablet 650 mg (has no administration in time range)    Or  acetaminophen (TYLENOL) suppository 650 mg (has no administration in time range)  traZODone (DESYREL) tablet 50 mg (has no administration in time range)  polyethylene glycol (MIRALAX / GLYCOLAX) packet 17 g (has no administration in time range)  bisacodyl (DULCOLAX) suppository 10 mg (has no administration in time range)  ondansetron (ZOFRAN) tablet 4 mg (has no administration in time range)    Or  ondansetron (ZOFRAN) injection 4 mg (has no administration in time range)  heparin injection 5,000 Units (has no administration in time range)  sodium chloride 0.9 % bolus 1,000 mL (0 mLs Intravenous Stopped 12/14/20 1629)  dicyclomine (BENTYL) injection 10 mg (10 mg Intramuscular Given 12/14/20 1622)  iohexol (OMNIPAQUE) 350 MG/ML injection 60 mL (60 mLs Intravenous Contrast Given 12/14/20 1604)    ED Course  I have reviewed the triage vital signs and the nursing notes.  Pertinent labs & imaging results that were available during my care of the patient were reviewed by me and considered in my medical decision making (see chart for details).    MDM Rules/Calculators/A&P                          Initial impression-patient presents with weakness and abdominal pain.  He is alert, does not appear acute stress, vital signs reassuring.  Patient was extremely dry on my exam and I suspect he suffered from dehydration from viral versus  bacterial gastroenteritis.  Will obtain basic lab work provide patient fluids and reassess.  Work-up-CBC shows leukocytosis of 19.0, CMP shows hyperkalemia 5.2, CO2 of 19, hyperglycemia 183, BUN of 79, creatinine 1.81, calcium 8.7, GFR 34.  Lipase is 17, first troponin is 22, second troponin is 23.  CT abdomen pelvis reveals segmental wall thickening of the distal rectosigmoid colon which may reflect inflammatory or infectious colitis, mild gaseous distention of the proximal colon and distal small bowel could reflect elements of an ileus.  Numerous small pancreatic cysts recommend MRI/MRCP the next 2 years for reevaluation.  EKG sinus without signs of ischemia.  Chest x-ray negative for acute findings.  Reassessment-EKG had some abnormal T wave inversions, will add on a troponin, and a chest x-ray for further evaluation.  Imaging shows probable colitis I suspect this is because of his elevated white count, will start him on antibiotics.  We will also recommend admission as he is severely dehydrated.  Patient is agreement this plan.  Will consult hospitalist team for admission.  Consult- spoke with Dr Joesph Fillers who will admit the patient   Rule out-I have low suspicion for ACS as presentation is atypical, patient has no cardiac history, EKG without signs of ischemia.  Patient does have an elevated troponin of 23 but I suspect this is secondary due to poor creatinine function as he has a GFR 34 suspect this is falsely elevated.  Low suspicion for pancreatitis as lipase is within normal limits.  Low suspicion for bowel obstruction, complicated diverticulitis, abdominal  abscess, [place], kidney stones CT imaging is negative for these findings.  Low suspicion for AAA and/or dissection as presentation is atypical, patient has low risk factors.  Low suspicion for liver or gallbladder abnormalities as he has no right upper quadrant tenderness, liver enzymes, alk phos are all within normal limits.  It is noted that  patient has a potassium of 5.2 likely this is falsely elevated as he is severely dehydrated suspect with fluids this will resolve on its own.  Plan-admit patient due to AKI as well as colitis Final Clinical Impression(s) / ED Diagnoses Final diagnoses:  AKI (acute kidney injury) [facility])  Colitis    Rx / DC Orders ED Discharge Orders     None        Marcello Fennel, PA-C 12/14/20 Towanda Octave, MD 12/15/20 1315

## 2020-12-14 NOTE — ED Notes (Signed)
Pt gone to CT 

## 2020-12-14 NOTE — ED Triage Notes (Signed)
Family went to check on pt this am.  Found pt to be weak and diarrhea x 2 days.  Unable to eat and take meds

## 2020-12-15 ENCOUNTER — Encounter (HOSPITAL_COMMUNITY): Payer: Self-pay | Admitting: Family Medicine

## 2020-12-15 ENCOUNTER — Inpatient Hospital Stay (HOSPITAL_COMMUNITY): Payer: Medicare Other

## 2020-12-15 DIAGNOSIS — E875 Hyperkalemia: Secondary | ICD-10-CM | POA: Diagnosis present

## 2020-12-15 DIAGNOSIS — D649 Anemia, unspecified: Secondary | ICD-10-CM | POA: Diagnosis present

## 2020-12-15 DIAGNOSIS — Z20822 Contact with and (suspected) exposure to covid-19: Secondary | ICD-10-CM | POA: Diagnosis present

## 2020-12-15 DIAGNOSIS — A419 Sepsis, unspecified organism: Secondary | ICD-10-CM | POA: Diagnosis present

## 2020-12-15 DIAGNOSIS — Z87891 Personal history of nicotine dependence: Secondary | ICD-10-CM | POA: Diagnosis not present

## 2020-12-15 DIAGNOSIS — I10 Essential (primary) hypertension: Secondary | ICD-10-CM | POA: Diagnosis present

## 2020-12-15 DIAGNOSIS — I4891 Unspecified atrial fibrillation: Secondary | ICD-10-CM | POA: Diagnosis present

## 2020-12-15 DIAGNOSIS — Z79899 Other long term (current) drug therapy: Secondary | ICD-10-CM | POA: Diagnosis not present

## 2020-12-15 DIAGNOSIS — E785 Hyperlipidemia, unspecified: Secondary | ICD-10-CM | POA: Diagnosis present

## 2020-12-15 DIAGNOSIS — N179 Acute kidney failure, unspecified: Secondary | ICD-10-CM | POA: Diagnosis present

## 2020-12-15 DIAGNOSIS — E119 Type 2 diabetes mellitus without complications: Secondary | ICD-10-CM | POA: Diagnosis present

## 2020-12-15 DIAGNOSIS — Z8249 Family history of ischemic heart disease and other diseases of the circulatory system: Secondary | ICD-10-CM | POA: Diagnosis not present

## 2020-12-15 DIAGNOSIS — J690 Pneumonitis due to inhalation of food and vomit: Secondary | ICD-10-CM

## 2020-12-15 DIAGNOSIS — K529 Noninfective gastroenteritis and colitis, unspecified: Secondary | ICD-10-CM | POA: Diagnosis present

## 2020-12-15 DIAGNOSIS — I459 Conduction disorder, unspecified: Secondary | ICD-10-CM | POA: Diagnosis present

## 2020-12-15 DIAGNOSIS — Z7982 Long term (current) use of aspirin: Secondary | ICD-10-CM | POA: Diagnosis not present

## 2020-12-15 DIAGNOSIS — Z7984 Long term (current) use of oral hypoglycemic drugs: Secondary | ICD-10-CM | POA: Diagnosis not present

## 2020-12-15 DIAGNOSIS — I495 Sick sinus syndrome: Secondary | ICD-10-CM | POA: Diagnosis present

## 2020-12-15 DIAGNOSIS — Z23 Encounter for immunization: Secondary | ICD-10-CM | POA: Diagnosis present

## 2020-12-15 DIAGNOSIS — Z880 Allergy status to penicillin: Secondary | ICD-10-CM | POA: Diagnosis not present

## 2020-12-15 DIAGNOSIS — E872 Acidosis: Secondary | ICD-10-CM | POA: Diagnosis present

## 2020-12-15 DIAGNOSIS — N400 Enlarged prostate without lower urinary tract symptoms: Secondary | ICD-10-CM | POA: Diagnosis present

## 2020-12-15 DIAGNOSIS — Z96642 Presence of left artificial hip joint: Secondary | ICD-10-CM | POA: Diagnosis present

## 2020-12-15 DIAGNOSIS — Z950 Presence of cardiac pacemaker: Secondary | ICD-10-CM | POA: Diagnosis not present

## 2020-12-15 LAB — BASIC METABOLIC PANEL
Anion gap: 5 (ref 5–15)
BUN: 69 mg/dL — ABNORMAL HIGH (ref 8–23)
CO2: 20 mmol/L — ABNORMAL LOW (ref 22–32)
Calcium: 7.7 mg/dL — ABNORMAL LOW (ref 8.9–10.3)
Chloride: 111 mmol/L (ref 98–111)
Creatinine, Ser: 1.39 mg/dL — ABNORMAL HIGH (ref 0.61–1.24)
GFR, Estimated: 47 mL/min — ABNORMAL LOW (ref >60)
Glucose, Bld: 143 mg/dL — ABNORMAL HIGH (ref 70–99)
Potassium: 4.2 mmol/L (ref 3.5–5.1)
Sodium: 136 mmol/L (ref 135–145)

## 2020-12-15 LAB — CBG MONITORING, ED: Glucose-Capillary: 134 mg/dL — ABNORMAL HIGH (ref 70–99)

## 2020-12-15 LAB — CBC
HCT: 37.1 % — ABNORMAL LOW (ref 39.0–52.0)
Hemoglobin: 12.6 g/dL — ABNORMAL LOW (ref 13.0–17.0)
MCH: 30.6 pg (ref 26.0–34.0)
MCHC: 34 g/dL (ref 30.0–36.0)
MCV: 90 fL (ref 80.0–100.0)
Platelets: 168 10*3/uL (ref 150–400)
RBC: 4.12 MIL/uL — ABNORMAL LOW (ref 4.22–5.81)
RDW: 15.9 % — ABNORMAL HIGH (ref 11.5–15.5)
WBC: 13.9 10*3/uL — ABNORMAL HIGH (ref 4.0–10.5)
nRBC: 0 % (ref 0.0–0.2)

## 2020-12-15 LAB — GLUCOSE, CAPILLARY
Glucose-Capillary: 190 mg/dL — ABNORMAL HIGH (ref 70–99)
Glucose-Capillary: 166 mg/dL — ABNORMAL HIGH (ref 70–99)

## 2020-12-15 MED ORDER — MIRTAZAPINE 15 MG PO TABS
7.5000 mg | ORAL_TABLET | Freq: Every day | ORAL | Status: DC
  Administered 2020-12-15: 7.5 mg via ORAL
  Filled 2020-12-15: qty 1

## 2020-12-15 MED ORDER — INFLUENZA VAC A&B SA ADJ QUAD 0.5 ML IM PRSY
0.5000 mL | PREFILLED_SYRINGE | INTRAMUSCULAR | Status: AC
  Administered 2020-12-16: 0.5 mL via INTRAMUSCULAR
  Filled 2020-12-15 (×3): qty 0.5

## 2020-12-15 MED ORDER — SODIUM BICARBONATE 8.4 % IV SOLN
INTRAVENOUS | Status: AC
  Filled 2020-12-15: qty 100

## 2020-12-15 MED ORDER — ENSURE ENLIVE PO LIQD
237.0000 mL | Freq: Three times a day (TID) | ORAL | Status: DC
  Administered 2020-12-15 – 2020-12-16 (×4): 237 mL via ORAL

## 2020-12-15 NOTE — Plan of Care (Signed)
  Problem: Education: Goal: Knowledge of General Education information will improve Description: Including pain rating scale, medication(s)/side effects and non-pharmacologic comfort measures Outcome: Progressing   Problem: Activity: Goal: Risk for activity intolerance will decrease Outcome: Progressing   Problem: Safety: Goal: Ability to remain free from injury will improve Outcome: Progressing   Problem: Skin Integrity: Goal: Risk for impaired skin integrity will decrease Outcome: Progressing  Blanchable redness noted to sacrum. Sacral foam dressing applied.

## 2020-12-15 NOTE — Progress Notes (Signed)
Patient Demographics:    Nathan Alvarez, is a 87 y.o. male, DOB - 02-06-1925, ZOX:[id number]  Admit date - 12/14/2020   Admitting Physician Jeree Delcid Mariea Clonts, MD  Outpatient Primary MD for the patient is Corrington, Kip A, MD  LOS - 0   Chief Complaint  Patient presents with   Weakness   Diarrhea        Subjective:    Nathan Alvarez today has no fevers, no emesis,  No chest pain, Son Jerric at bedside -??  Aspiration episode with concerns for dyspnea -Chest x-ray suggest possible bilateral pneumonia worse in the medial left base -Patient with cough but no hypoxia  Assessment  & Plan :    Principal Problem:   Sepsis due to aspiration pneumonia and enterocolitis Active Problems:   AKI (acute kidney injury) (HCC)   Enterocolitis   Aspiration pneumonia (HCC)   Hypertension   Diabetes (HCC)   Anemia   Sinus node dysfunction (HCC)   Pacemaker  Brief Summary:- 87 y.o. male with pmhx relevant for DM2 and HTN and BPH presents with generalized weakness, fevers , abdominal pain and diarrhea for 2 days PTA, initially admitted on 12/14/2020 -On 12/15/2020 there was concern for possible aspiration, repeat chest x-ray suggest possible aspiration pneumonia  A/p 1) sepsis secondary to combination of aspiration pneumonia and enterocolitis--POA -Patient meets sepsis criteria with leukocytosis, tachycardia and tachypnea as well as fevers PTA -Continue IV fluids, IV Rocephin and Flagyl as ordered -  2)AKI----acute kidney injury with non-anion gap metabolic acidosis and hyperkalemia  -Potassium has normalized, --Creatinine 1.81 >>1.39 (baseline usually around 0.9),  Bicarb is up to 20, anion gap is down to 5 from 12,  --Renally adjust medications, avoid nephrotoxic agents / dehydration  / hypotension -IV fluids with bicarb supplementation    2)Enterocolitis--- please see #1 above -CT AP--shows Segmental wall  thickening of the distal rectosigmoid colon, which may reflect inflammatory or infectious colitis,  Mild gaseous distension of the proximal colon and distal small bowel, which could reflect an element of ileus. No high-grade bowel obstruction. -IV Rocephin and Flagyl as ordered -Stool for C. difficile and stool pathogen culture/pathogen requested -May use Imodium if stool for C. difficile negative  3)Aspiration Pneumonia--- Rocephin and Flagyl as above #1 -Get speech evaluation  4)Anorexia/insomnia/Depression--- as per patient's son patient is emotionally down, he is not suicidal, but he is Not really motivated to eat or stay alive -Will give Remeron 7.5 mg nightly for mood, insomnia and appetite stimulation -Get palliative care consult for goals of care   5)History of chronic anemia with baseline hemoglobin usually around 10 -hemoglobin was up to 15 from a baseline usually around 10 suggesting hemoconcentration -With hydration hemoglobin is trending down -PTA patient was on iron supplementation   6)DM2-  -Hold Januvia until oral intake is more reliable Use Novolog/Humalog Sliding scale insulin with Accu-Cheks/Fingersticks as ordered    7)HTN--- c/n Coreg and amlodipine   8)BPH--continue Flomax   9)History of sick sinus syndrome/status post pacemaker placement, continue Coreg   Disposition/Need for in-Hospital Stay- patient unable to be discharged at this time due to sepsis secondary to enterocolitis and aspiration pneumonia compounded by AKI with non-anion gap metabolic acidosis in the setting of diarrhea secondary to enterocolitis requiring IV  antibiotics and IV fluids*   Dispo: The patient is from: Home              Anticipated d/c is to:  TBD HOME with HH Vs SNF              Anticipated d/c date is: 2 days              Patient currently is not medically stable to d/c. Barriers: Not Clinically Stable-   Code Status :  -  Code Status: Full Code   Family Communication:    (patient is alert, awake and coherent)  Discussed with son Nathan Alvarez at bedside   Consults  :  na  DVT Prophylaxis  :   - SCDs  heparin injection 5,000 Units Start: 12/14/20 2200 SCDs Start: 12/14/20 1845 Place TED hose Start: 12/14/20 1845    Lab Results  Component Value Date   PLT 168 12/15/2020    Inpatient Medications  Scheduled Meds:  amLODipine  10 mg Oral Daily   ascorbic acid  1,000 mg Oral Daily   aspirin EC  81 mg Oral Daily   calcium carbonate  1 tablet Oral BID WC   carvedilol  6.25 mg Oral BID   cholecalciferol  5,000 Units Oral Daily   feeding supplement  237 mL Oral TID PC   heparin  5,000 Units Subcutaneous Q8H   [START ON 12/16/2020] influenza vaccine adjuvanted  0.5 mL Intramuscular Tomorrow-1000   insulin aspart  0-5 Units Subcutaneous QHS   insulin aspart  0-6 Units Subcutaneous TID WC   mirtazapine  7.5 mg Oral QHS   sodium chloride flush  3 mL Intravenous Q12H   sodium chloride flush  3 mL Intravenous Q12H   tamsulosin  0.4 mg Oral Daily   Continuous Infusions:  sodium chloride     cefTRIAXone (ROCEPHIN)  IV Stopped (12/14/20 2058)   metronidazole 500 mg (12/15/20 1052)   sodium bicarbonate in 0.45 NS mL infusion 100 mL/hr at 12/15/20 0936   PRN Meds:.sodium chloride, acetaminophen **OR** acetaminophen, bisacodyl, ondansetron **OR** ondansetron (ZOFRAN) IV, polyethylene glycol, sodium chloride flush, traZODone    Anti-infectives (From admission, onward)    Start     Dose/Rate Route Frequency Ordered Stop   12/14/20 1815  cefTRIAXone (ROCEPHIN) 2 g in sodium chloride 0.9 % 100 mL IVPB        2 g 200 mL/hr over 30 Minutes Intravenous Every 24 hours 12/14/20 1808     12/14/20 1815  metroNIDAZOLE (FLAGYL) IVPB 500 mg        500 mg 100 mL/hr over 60 Minutes Intravenous Every 12 hours 12/14/20 1808     12/14/20 1745  ciprofloxacin (CIPRO) IVPB 400 mg  Status:  Discontinued        400 mg 200 mL/hr over 60 Minutes Intravenous  Once 12/14/20 1737  12/14/20 1844   12/14/20 1745  metroNIDAZOLE (FLAGYL) tablet 500 mg  Status:  Discontinued        500 mg Oral  Once 12/14/20 1737 12/14/20 1808         Objective:   Vitals:   12/15/20 0807 12/15/20 0946 12/15/20 1243 12/15/20 1413  BP: 131/86 122/64 107/63 116/60  Pulse: 75   72  Resp: 19 (!) 21 (!) 34 18  Temp: 98.3 F (36.8 C)   98.5 F (36.9 C)  TempSrc: Oral   Oral  SpO2: 95% 98% 98% 97%  Weight:      Height:  Wt Readings from Last 3 Encounters:  12/14/20 64.9 kg  03/06/20 64.9 kg  04/25/19 62.4 kg     Intake/Output Summary (Last 24 hours) at 12/15/2020 1621 Last data filed at 12/15/2020 1500 Gross per 24 hour  Intake 2987.27 ml  Output --  Net 2987.27 ml     Physical Exam  Gen:- Awake Alert, chronically ill and frail appearing HEENT:- [place].AT, No sclera icterus Neck-Supple Neck,No JVD,.  Lungs-fair air movement, few scattered rhonchi bilaterally  CV- S1, S2 normal, regular  Abd-  +ve B.Sounds, Abd Soft, generalized abdominal discomfort without rebound or guarding Extremity/Skin:- No  edema, pedal pulses present  Psych-affect is appropriate, oriented x3 Neuro-generalized weakness, no new focal deficits, no tremors   Data Review:   Micro Results Recent Results (from the past 240 hour(s))  Blood culture (routine x 2)     Status: None (Preliminary result)   Collection Time: 12/14/20  6:00 PM   Specimen: BLOOD LEFT HAND  Result Value Ref Range Status   Specimen Description BLOOD LEFT HAND  Final   Special Requests   Final    Blood Culture adequate volume BOTTLES DRAWN AEROBIC AND ANAEROBIC   Culture   Final    NO GROWTH < 24 HOURS Performed at [facility], [address], [place], [place] [address]    Report Status PENDING  Incomplete  Blood culture (routine x 2)     Status: None (Preliminary result)   Collection Time: 12/14/20  6:00 PM   Specimen: BLOOD RIGHT ARM  Result Value Ref Range Status   Specimen Description BLOOD RIGHT ARM  Final    Special Requests   Final    Blood Culture adequate volume BOTTLES DRAWN AEROBIC AND ANAEROBIC   Culture   Final    NO GROWTH < 24 HOURS Performed at [facility], [address], [place], [place] [address]    Report Status PENDING  Incomplete  Resp Panel by RT-PCR (Flu A&B, Covid) Nasopharyngeal Swab     Status: None   Collection Time: 12/14/20  7:15 PM   Specimen: Nasopharyngeal Swab; Nasopharyngeal(NP) swabs in vial transport medium  Result Value Ref Range Status   SARS Coronavirus 2 by RT PCR NEGATIVE NEGATIVE Final    Comment: (NOTE) SARS-CoV-2 target nucleic acids are NOT DETECTED.  The SARS-CoV-2 RNA is generally detectable in upper respiratory specimens during the acute phase of infection. The lowest concentration of SARS-CoV-2 viral copies this assay can detect is 138 copies/mL. A negative result does not preclude SARS-Cov-2 infection and should not be used as the sole basis for treatment or other patient management decisions. A negative result may occur with  improper specimen collection/handling, submission of specimen other than nasopharyngeal swab, presence of viral mutation(s) within the areas targeted by this assay, and inadequate number of viral copies(<138 copies/mL). A negative result must be combined with clinical observations, patient history, and epidemiological information. The expected result is Negative.  Fact Sheet for Patients:  BloggerCourse.com  Fact Sheet for Healthcare Providers:  SeriousBroker.it  This test is no t yet approved or cleared by the [place] FDA and  has been authorized for detection and/or diagnosis of SARS-CoV-2 by FDA under an Emergency Use Authorization (EUA). This EUA will remain  in effect (meaning this test can be used) for the duration of the COVID-19 declaration under Section 564(b)(1) of the Act, 21 U.S.C.section 360bbb-3(b)(1), unless the authorization is terminated  or  revoked sooner.       Influenza A by PCR  NEGATIVE NEGATIVE Final   Influenza B by PCR NEGATIVE NEGATIVE Final    Comment: (NOTE) The Xpert Xpress SARS-CoV-2/FLU/RSV plus assay is intended as an aid in the diagnosis of influenza from Nasopharyngeal swab specimens and should not be used as a sole basis for treatment. Nasal washings and aspirates are unacceptable for Xpert Xpress SARS-CoV-2/FLU/RSV testing.  Fact Sheet for Patients: BloggerCourse.com  Fact Sheet for Healthcare Providers: SeriousBroker.it  This test is not yet approved or cleared by the [place] FDA and has been authorized for detection and/or diagnosis of SARS-CoV-2 by FDA under an Emergency Use Authorization (EUA). This EUA will remain in effect (meaning this test can be used) for the duration of the COVID-19 declaration under Section 564(b)(1) of the Act, 21 U.S.C. section 360bbb-3(b)(1), unless the authorization is terminated or revoked.  Performed at [facility], [address], [place], [place] [address]     Radiology Reports CT ABDOMEN PELVIS W CONTRAST  Result Date: 12/14/2020 CLINICAL DATA:  Generalized abdominal pain, weakness, diarrhea for 2 days EXAM: CT ABDOMEN AND PELVIS WITH CONTRAST TECHNIQUE: Multidetector CT imaging of the abdomen and pelvis was performed using the standard protocol following bolus administration of intravenous contrast. CONTRAST:  22mL OMNIPAQUE IOHEXOL 350 MG/ML SOLN COMPARISON:  None. FINDINGS: Lower chest: No acute pleural or parenchymal lung disease. The heart is enlarged with trace pericardial fluid. Multi lead pacer is noted. Hepatobiliary: No focal liver abnormality is seen. No gallstones, gallbladder wall thickening, or biliary dilatation. Pancreas: There is diffuse pancreatic atrophy, with innumerable small pancreatic cystic lesions measuring up to 10 mm in size, reference image 27/2. Spleen: Normal in size without focal  abnormality. Adrenals/Urinary Tract: Upper pole left renal cyst. Otherwise the kidneys enhance normally and symmetrically. No urinary tract calculi or obstructive uropathy. Mild nonspecific adrenal thickening.  The bladder is unremarkable. Stomach/Bowel: There is segmental wall thickening of the rectosigmoid colon which could reflect inflammatory or infectious colitis. Mild nonspecific distension of the distal small bowel and proximal colon may reflect an element of ileus. No high-grade obstruction. The stomach is decompressed. Vascular/Lymphatic: There is extensive atherosclerosis throughout the aorta and its branches. No aneurysm or dissection. No pathologic adenopathy within the abdomen or pelvis. Reproductive: Prostate is not enlarged. Evaluation is limited due to streak artifact from a left hip arthroplasty. Other: Small volume ascites is seen primarily within the left upper quadrant surrounding the spleen, nonspecific. No free intraperitoneal gas. No abdominal wall hernia. Musculoskeletal: Unremarkable left hip arthroplasty. No acute or destructive bony lesions. Reconstructed images demonstrate no additional findings. IMPRESSION: 1. Segmental wall thickening of the distal rectosigmoid colon, which may reflect inflammatory or infectious colitis. 2. Mild gaseous distension of the proximal colon and distal small bowel, which could reflect an element of ileus. No high-grade bowel obstruction. 3. Nonspecific free fluid left upper quadrant abdomen. 4. Numerous small pancreatic cysts, largest measuring 10 mm. Recommend follow up pre and post contrast MRI/MRCP or pancreatic protocol CT in 2 years. This recommendation follows ACR consensus guidelines: Management of Incidental Pancreatic Cysts: A White Paper of the ACR Incidental Findings Committee. J Am Coll Radiol 2017;14:911-923. 5. Aortic Atherosclerosis (ICD10-I70.0). Coronary artery atherosclerosis. Electronically Signed   By: Sharlet Salina M.D.   On: 12/14/2020  16:53   DG CHEST PORT 1 VIEW  Result Date: 12/15/2020 CLINICAL DATA:  Dyspnea EXAM: PORTABLE CHEST 1 VIEW COMPARISON:  12/14/2020, 04/19/2020 FINDINGS: Low lung volumes. Left-sided pacing device as before. Mild cardiomegaly. Patchy opacity in the right mid lung and left  base with partial consolidation at the medial left base. No pleural effusion. Aortic atherosclerosis. IMPRESSION: 1. Low lung volumes with cardiomegaly. 2. Patchy airspace opacities in the bilateral lungs with partial consolidation in medial left base, suggestive of pneumonia Electronically Signed   By: Jasmine Pang M.D.   On: 12/15/2020 15:13   DG Chest Port 1 View  Result Date: 12/14/2020 CLINICAL DATA:  Weakness. EXAM: PORTABLE CHEST 1 VIEW COMPARISON:  Chest x-ray dated April 20, 2019. FINDINGS: Unchanged left chest wall pacemaker and mild cardiomegaly. Low lung volumes. No focal consolidation, pleural effusion, or pneumothorax. No acute osseous abnormality. Borderline dilated small bowel loops in the upper abdomen, please see separate CT abdomen pelvis report from same day. IMPRESSION: 1. No active disease. Electronically Signed   By: Obie Dredge M.D.   On: 12/14/2020 18:50     CBC Recent Labs  Lab 12/14/20 1432 12/15/20 0415  WBC 19.0* 13.9*  HGB 15.2 12.6*  HCT 44.4 37.1*  PLT 216 168  MCV 90.1 90.0  MCH 30.8 30.6  MCHC 34.2 34.0  RDW 15.9* 15.9*  LYMPHSABS 1.3  --   MONOABS 1.0  --   EOSABS 0.0  --   BASOSABS 0.0  --     Chemistries  Recent Labs  Lab 12/14/20 1432 12/15/20 0415  NA 137 136  K 5.2* 4.2  CL 106 111  CO2 19* 20*  GLUCOSE 183* 143*  BUN 79* 69*  CREATININE 1.81* 1.39*  CALCIUM 8.7* 7.7*  AST 25  --   ALT 19  --   ALKPHOS 65  --   BILITOT 1.1  --    ------------------------------------------------------------------------------------------------------------------ No results for input(s): CHOL, HDL, LDLCALC, TRIG, CHOLHDL, LDLDIRECT in the last 72 hours.  Lab Results   Component Value Date   HGBA1C 6.6 (H) 12/14/2020   ------------------------------------------------------------------------------------------------------------------ No results for input(s): TSH, T4TOTAL, T3FREE, THYROIDAB in the last 72 hours.  Invalid input(s): FREET3 ------------------------------------------------------------------------------------------------------------------ No results for input(s): VITAMINB12, FOLATE, FERRITIN, TIBC, IRON, RETICCTPCT in the last 72 hours.  Coagulation profile No results for input(s): INR, PROTIME in the last 168 hours.  No results for input(s): DDIMER in the last 72 hours.  Cardiac Enzymes No results for input(s): CKMB, TROPONINI, MYOGLOBIN in the last 168 hours.  Invalid input(s): CK ------------------------------------------------------------------------------------------------------------------ No results found for: BNP   Shon Hale M.D on 12/15/2020 at 4:21 PM  Go to [email or web address] - for contact info  [facility] - Office  [phone]

## 2020-12-15 NOTE — Progress Notes (Signed)
   12/15/20 1243  Assess: MEWS Score  BP 107/63  Resp (!) 34  Level of Consciousness Alert  SpO2 98 %  O2 Device Room Air  Assess: MEWS Score  MEWS Temp 0  MEWS Systolic 0  MEWS Pulse 0  MEWS RR 2  MEWS LOC 0  MEWS Score 2  MEWS Score Color Yellow  Assess: if the MEWS score is Yellow or Red  Were vital signs taken at a resting state? Yes  Treat  Pain Scale 0-10  Pain Score 0  Notify: Charge Nurse/RN  Name of Charge Nurse/RN Notified Morrie Sheldon RN  Date Charge Nurse/RN Notified 12/15/20  Time Charge Nurse/RN Notified 1246  Notify: Provider  Provider Name/Title Dr Marisa Severin  Date Provider Notified 12/15/20  Time Provider Notified 1246  Notification Type Face-to-face  Notification Reason Other (Comment) (respirations tachy patient holding food in his mouth)  Provider response At bedside  Date of Provider Response 12/15/20  Time of Provider Response 1246

## 2020-12-15 NOTE — Progress Notes (Signed)
Pt feeling SOB O2 checked O2 sat was at 98% respirations tachy at 34 for full minute. Pt was having difficulty swallowing  and complained of feeling SOB. Attempted trial with bite of chicken and gravy and he chewed it for along time and held it in his mouth. Missing teeth dentures at bedside. Offered to help put dentures in pt declined. Pt reported decrease in appetite. Called MD made aware. Informed charge of yellow mews. MD came to bedside ordered chest xray and speech eval. Pt's son was at bedside pulled nurse out of room and informed me that his dad wants to die and has been telling him he doesn't want to eat. Informed MD. See orders. Will continue to monitor patient.

## 2020-12-15 NOTE — ED Notes (Signed)
Attempted to call report. Secretary stated that nurse would call back.

## 2020-12-16 DIAGNOSIS — I10 Essential (primary) hypertension: Secondary | ICD-10-CM

## 2020-12-16 DIAGNOSIS — Z950 Presence of cardiac pacemaker: Secondary | ICD-10-CM

## 2020-12-16 DIAGNOSIS — I495 Sick sinus syndrome: Secondary | ICD-10-CM

## 2020-12-16 LAB — CLOSTRIDIUM DIFFICILE BY PCR, REFLEXED: Toxigenic C. Difficile by PCR: POSITIVE — AB

## 2020-12-16 LAB — RENAL FUNCTION PANEL
Albumin: 2.6 g/dL — ABNORMAL LOW (ref 3.5–5.0)
Anion gap: 6 (ref 5–15)
BUN: 54 mg/dL — ABNORMAL HIGH (ref 8–23)
CO2: 24 mmol/L (ref 22–32)
Calcium: 7.7 mg/dL — ABNORMAL LOW (ref 8.9–10.3)
Chloride: 108 mmol/L (ref 98–111)
Creatinine, Ser: 1.01 mg/dL (ref 0.61–1.24)
GFR, Estimated: >60 mL/min
Glucose, Bld: 157 mg/dL — ABNORMAL HIGH (ref 70–99)
Phosphorus: 2 mg/dL — ABNORMAL LOW (ref 2.5–4.6)
Potassium: 3.6 mmol/L (ref 3.5–5.1)
Sodium: 138 mmol/L (ref 135–145)

## 2020-12-16 LAB — CBC
HCT: 35.3 % — ABNORMAL LOW (ref 39.0–52.0)
Hemoglobin: 12 g/dL — ABNORMAL LOW (ref 13.0–17.0)
MCH: 30.9 pg (ref 26.0–34.0)
MCHC: 34 g/dL (ref 30.0–36.0)
MCV: 91 fL (ref 80.0–100.0)
Platelets: 154 10*3/uL (ref 150–400)
RBC: 3.88 MIL/uL — ABNORMAL LOW (ref 4.22–5.81)
RDW: 16.2 % — ABNORMAL HIGH (ref 11.5–15.5)
WBC: 7.1 10*3/uL (ref 4.0–10.5)
nRBC: 0 % (ref 0.0–0.2)

## 2020-12-16 LAB — C DIFFICILE QUICK SCREEN W PCR REFLEX
C Diff antigen: POSITIVE — AB
C Diff toxin: NEGATIVE

## 2020-12-16 LAB — GLUCOSE, CAPILLARY
Glucose-Capillary: 133 mg/dL — ABNORMAL HIGH (ref 70–99)
Glucose-Capillary: 215 mg/dL — ABNORMAL HIGH (ref 70–99)

## 2020-12-16 MED ORDER — ACETAMINOPHEN 325 MG PO TABS: 650.0000 mg | ORAL_TABLET | Freq: Four times a day (QID) | ORAL | 1 refills | Status: AC | PRN

## 2020-12-16 MED ORDER — ASPIRIN 81 MG PO TBEC: 81.0000 mg | DELAYED_RELEASE_TABLET | Freq: Every day | ORAL | 11 refills | Status: AC

## 2020-12-16 MED ORDER — CARVEDILOL 6.25 MG PO TABS: 6.2500 mg | ORAL_TABLET | Freq: Two times a day (BID) | ORAL | 3 refills | Status: DC

## 2020-12-16 MED ORDER — CALCIUM CARBONATE 1250 (500 CA) MG PO TABS: 1.0000 | ORAL_TABLET | Freq: Two times a day (BID) | ORAL | 1 refills | Status: DC

## 2020-12-16 MED ORDER — JANUVIA 50 MG PO TABS: 50.0000 mg | ORAL_TABLET | Freq: Every day | ORAL | 1 refills | Status: DC

## 2020-12-16 MED ORDER — VITAMIN D 125 MCG (5000 UT) PO CAPS: 1.0000 | ORAL_CAPSULE | Freq: Every day | ORAL | 3 refills | Status: DC

## 2020-12-16 MED ORDER — CEFDINIR 300 MG PO CAPS: 300.0000 mg | ORAL_CAPSULE | Freq: Two times a day (BID) | ORAL | 0 refills | Status: AC

## 2020-12-16 MED ORDER — MIRTAZAPINE 7.5 MG PO TABS: 7.5000 mg | ORAL_TABLET | Freq: Every day | ORAL | 4 refills | Status: DC

## 2020-12-16 MED ORDER — VANCOMYCIN HCL 125 MG PO CAPS: 125.0000 mg | ORAL_CAPSULE | Freq: Four times a day (QID) | ORAL | 0 refills | Status: AC

## 2020-12-16 MED ORDER — METRONIDAZOLE 500 MG PO TABS: 500.0000 mg | ORAL_TABLET | Freq: Three times a day (TID) | ORAL | 0 refills | Status: AC

## 2020-12-16 MED ORDER — LOPERAMIDE HCL 2 MG PO TABS: 2.0000 mg | ORAL_TABLET | Freq: Two times a day (BID) | ORAL | 0 refills | Status: DC | PRN

## 2020-12-16 MED ORDER — TAMSULOSIN HCL 0.4 MG PO CAPS: 0.4000 mg | ORAL_CAPSULE | Freq: Every day | ORAL | 3 refills | Status: DC

## 2020-12-16 NOTE — Plan of Care (Signed)
  Problem: Acute Rehab PT Goals(only PT should resolve) Goal: Pt Will Ambulate Flowsheets (Taken 12/16/2020 1110) Pt will Ambulate:  > 125 feet  with modified independence  with rolling walker Goal: Pt Will Go Up/Down Stairs Flowsheets (Taken 12/16/2020 1110) Pt will Go Up / Down Stairs:  3-5 stairs  with minimal assist  with rail(s)   11:11 AM, 12/16/20 Tereasa Coop, DPT Physical Therapy with [facility]  [phone] office

## 2020-12-16 NOTE — Discharge Instructions (Addendum)
1)Please call [phone] after 12 noon on Tuesday, 12/18/2020 and ask for Dr. Shon Hale to get the final result of your stool culture/test  2)Please note that there has been several changes to your medications  3)Follow up with Corrington, Kip A, MD --your primary care provider within a week for recheck and reevaluation for repeat CBC and BMP blood test  4)Dysphagia 2 Diet -(Fine chop Solids)-in order to reduce risk of aspiration and  Regular liquids advised  5)Please take oral Vancomycin as prescribed 4 times a day for 14 days  for C. difficile infection/Colitis

## 2020-12-16 NOTE — Discharge Summary (Addendum)
Nathan Alvarez, is a 87 y.o. male  DOB 26-Oct-1924  MRN [id number].  Admission date:  12/14/2020  Admitting Physician  Roxan Hockey, MD  Discharge Date:  12/16/2020   Primary MD  Corrington, Delsa Grana, MD  Recommendations for primary care physician for things to follow:   1)Please call [phone] after 12 noon on Tuesday, 12/18/2020 and ask for Dr. Roxan Hockey to get the final result of your stool culture/test  2)Please note that there has been several changes to your medications  3)Follow up with Corrington, Kip A, MD --your primary care provider within a week for recheck and reevaluation for repeat CBC and BMP blood test  4)Dysphagia 2 Diet -(Fine chop Solids)-in order to reduce risk of aspiration and  Regular liquids advised  5)Please take oral Vancomycin as prescribed 4 times a day for 14 days  for C. difficile infection/Colitis   Admission Diagnosis  Colitis-C diff test is Equivocal , AKI (acute kidney injury) ([place]) [N17.9] Enterocolitis -C diff test is Equivocal ,   Discharge Diagnosis  Colitis -C diff test is Equivocal , AKI (acute kidney injury) ([place]) [N17.9] Enterocolitis --C diff test is Equivocal ,  Principal Problem:   Sepsis due to aspiration pneumonia and enterocolitis Active Problems:   AKI (acute kidney injury) ([place])   Enterocolitis-C diff test is Equivocal    Aspiration pneumonia ([place])   Hypertension   Diabetes ([place])   Anemia   Sinus node dysfunction ([place])   Pacemaker      Past Medical History:  Diagnosis Date   A-fib ([place])    Dizziness    DM (diabetes mellitus) ([place])    Esophagitis, reflux    Heart block    Hyperlipidemia    Hypertension, essential, benign    Memory deficit    Pathological fracture of left hip due to age-related osteoporosis ([place]) 04/25/2019   SOB (shortness of breath)    SSS (sick sinus syndrome) ([place])     Past Surgical History:  Procedure  Laterality Date   CATARACT EXTRACTION Right    HIP ARTHROPLASTY Left 04/22/2019   Procedure: ARTHROPLASTY BIPOLAR HIP (HEMIARTHROPLASTY);  Surgeon: Altamese Maili, MD;  Location: [facility];  Service: Orthopedics;  Laterality: Left;   INGUINAL HERNIA REPAIR     INSERT / REPLACE / REMOVE PACEMAKER     PACEMAKER INSERTION     TONSILLECTOMY AND ADENOIDECTOMY       HPI  from the history and physical done on the day of admission:    Nathan Alvarez  is a 87 y.o. male with pmhx relevant for DM2 and HTN and BPH presents with generalized weakness, fevers , abdominal pain and diarrhea for 2 days -Patient lives independently typically, he was very weak and was found by daughter covered in feces today - -Lipase is 17 -Troponin is 22, repeat troponin is 23, EKG sinus rhythm without acute ST changes -WBCs 19.0 hemoglobin is up to 15 from a baseline usually around 10 suggesting hemoconcentration -Creatinine is up to 1.81 from a baseline usually around 0.9,  BUN is up to 79 from baseline usually around 26, bicarb is 19, anion gap is 12   CT AP--shows Segmental wall thickening of the distal rectosigmoid colon, which may reflect inflammatory or infectious colitis,  Mild gaseous distension of the proximal colon and distal small bowel, which could reflect an element of ileus. No high-grade bowel obstruction.   -No frank chest pains or shortness of breath     Hospital Course:    Brief Summary:- 87 y.o. male with pmhx relevant for DM2 and HTN and BPH presents with generalized weakness, fevers , abdominal pain and diarrhea for 2 days PTA, initially admitted on 12/14/2020 -On 12/15/2020 there was concern for possible aspiration, repeat chest x-ray suggest possible aspiration pneumonia   A/p 1) sepsis secondary to combination of aspiration pneumonia and enterocolitis--POA -Patient met sepsis criteria with leukocytosis, tachycardia and tachypnea as well as fevers PTA -Improved significantly on iv rocephin and  flagyl -Sepsis pathophysiology resolved -C. difficile antigen is positive, C. difficile toxin is negative, C. difficile PCR pending C diff test is Equivocal , will treat empirically as patient is symptomatic -Oral vancomycin 125 mg 4 times daily for 14 days per protocol -GI pathogen pending--- okay to continue Flagyl for additional 5 days pending GI pathogen results -Continue Omnicef for additional 5 days for  pneumonia -Patient has had only 1 episode of loose stools in the last 24 hours -Okay to discharge home on oral regimen as outlined above   2)AKI----acute kidney injury with non-anion gap metabolic acidosis and hyperkalemia  -Potassium has normalized, --Creatinine 1.81 >>1.39 >>1.01 (baseline usually around 0.9),  Bicarb is up to 24 from 19 on admission - anion gap is down to 6 from 12,  --Renally adjust medications, avoid nephrotoxic agents / dehydration  / hypotension BUN trended down -Patient is tolerating oral intake well    2)Enterocolitis--- please see #1 above -CT AP--shows Segmental wall thickening of the distal rectosigmoid colon, which may reflect inflammatory or infectious colitis,  Mild gaseous distension of the proximal colon and distal small bowel, which could reflect an element of ileus. No high-grade bowel obstruction. -Treated with IV Rocephin and Flagyl as ordered --Patient has had only 1 episode of loose stools in the last 24 hours -Okay to discharge home on oral regimen as outlined above in # 1   3)Aspiration Pneumonia--- Rocephin and Flagyl as above #1 -Speech and swallow eval appreciated recommends fine chopped diet (dysphagia 2)   4)Anorexia/insomnia/Depression--- as per patient's son patient is emotionally down, he is Not suicidal, but he is Not really motivated to eat or stay alive according to patient's son -Started on Remeron 7.5 mg nightly for mood, insomnia and appetite stimulation -Outpatient counseling and further evaluation and ongoing management of  depression advised   5)History of chronic anemia with baseline hemoglobin usually around 10 -hemoglobin was up to 15 from a baseline usually around 10 suggesting hemoconcentration -Hemoglobin currently stable around 12 -With hydration hemoglobin is trending down -PTA patient was on iron supplementation   6)DM2-  Okay to restart Januvia at 50 mg daily  7)HTN--- c/n Coreg    8)BPH--continue Flomax   9)History of sick sinus syndrome/status post pacemaker placement, continue Coreg  10)Generalized weakness and deconditioning--PT eval appreciated recommends home health PT -Prior to admission patient was able to ambulate with walker across the street to his son's house where he will usually eat dinner with his son every evening  Social/Ethics--patient's son and daughter-in-law concerned that they have a trip previously planned to  go out of state to [place]---  -They are wondering if there could be somebody to keep an outpatient while he was gone out of town to [place] -Prior to patient being admitted to the hospital they were already talking to a local facility/SNF/ALF close to their current residence to have patient stayed at that local facility with private pay while they were out of town to [place] -Patient does not meet criteria for further inpatient stay at this time -Family advised that they can continue with their plans to private pay for patient to stay as an ALF/SNF facility while they are out of town in [place] -In case they decide to keep patient at home when they go out of town to [place] we have requested home health PT, home health aide and social worker to help check on patient and help patient recover   Disposition/--discharge home with home health PT Home health social worker and home health aide  Discharge Condition: stable  Follow UP   Follow-up Information     Corrington, Kip A, MD. Schedule an appointment as soon as possible for a visit in 1  week(s).   Specialty: Family Medicine Why: For repeat CBC and BMP blood test Contact information: [place] [address] [place] [address] [phone]         Evans Lance, MD .   Specialty: Cardiology Contact information: [phone] [address] [place] [address] [phone]                 Diet and Activity recommendation:  As advised  Discharge Instructions     Discharge Instructions     Call MD for:  difficulty breathing, headache or visual disturbances   Complete by: As directed    Call MD for:  persistant dizziness or light-headedness   Complete by: As directed    Call MD for:  persistant nausea and vomiting   Complete by: As directed    Call MD for:  temperature >100.4   Complete by: As directed    Diet - low sodium heart healthy   Complete by: As directed    Dysphagia 2 Diet -(Fine chop solids)-in order to reduce risk of aspiration and  Regular liquids advised   Discharge instructions   Complete by: As directed    1)Please call [phone] after 12 noon on Tuesday, 12/18/2020 and ask for Dr. Roxan Hockey to get the final result of your stool culture/test  2)Please note that there has been several changes to your medications  3)Follow up with Corrington, Kip A, MD --your primary care provider within a week for recheck and reevaluation for repeat CBC and BMP blood test  4)Dysphagia 2 Diet -(Fine chop Solids)-in order to reduce risk of aspiration and  Regular liquids advised  5)Please take oral Vancomycin as prescribed 4 times a day for 14 days  for C. difficile infection/Colitis   Increase activity slowly   Complete by: As directed          Discharge Medications     Allergies as of 12/16/2020       Reactions   Penicillins    unknown        Medication List     STOP taking these medications    amLODipine 10 MG tablet Commonly known as: NORVASC   ascorbic acid 500 MG tablet Commonly known as: VITAMIN C    polyethylene glycol powder 17 GM/SCOOP powder Commonly known as: GLYCOLAX/MIRALAX  TAKE these medications    acetaminophen 325 MG tablet Commonly known as: TYLENOL Take 2 tablets (650 mg total) by mouth every 6 (six) hours as needed for mild pain (or Fever >/= 101). What changed:  medication strength how much to take when to take this reasons to take this   aspirin 81 MG EC tablet Take 1 tablet (81 mg total) by mouth daily with breakfast. Swallow whole. What changed:  when to take this additional instructions Another medication with the same name was removed. Continue taking this medication, and follow the directions you see here.   calcium carbonate 1250 (500 Ca) MG tablet Commonly known as: OS-CAL - dosed in mg of elemental calcium Take 1 tablet (500 mg of elemental calcium total) by mouth 2 (two) times daily with a meal.   carvedilol 6.25 MG tablet Commonly known as: COREG Take 1 tablet (6.25 mg total) by mouth 2 (two) times daily. For BP and Heart What changed: additional instructions   cefdinir 300 MG capsule Commonly known as: OMNICEF Take 1 capsule (300 mg total) by mouth 2 (two) times daily for 5 days.   diclofenac Sodium 1 % Gel Commonly known as: VOLTAREN Apply 4 g topically every 8 (eight) hours as needed (pain).   Iron 325 (65 Fe) MG Tabs Take 28 mg by mouth daily.   Januvia 50 MG tablet Generic drug: sitaGLIPtin Take 1 tablet (50 mg total) by mouth daily. What changed: Another medication with the same name was removed. Continue taking this medication, and follow the directions you see here.   metroNIDAZOLE 500 MG tablet Commonly known as: FLAGYL Take 1 tablet (500 mg total) by mouth 3 (three) times daily for 5 days.   mirtazapine 7.5 MG tablet Commonly known as: REMERON Take 1 tablet (7.5 mg total) by mouth at bedtime. For appetite stimulation and mood   tamsulosin 0.4 MG Caps capsule Commonly known as: FLOMAX Take 1 capsule (0.4 mg  total) by mouth daily.   vancomycin 125 MG capsule Commonly known as: Vancocin Take 1 capsule (125 mg total) by mouth 4 (four) times daily for 14 days. For c diff infection/colitis   Vitamin D 125 MCG (5000 UT) Caps Take 1 capsule by mouth daily.        Major procedures and Radiology Reports - PLEASE review detailed and final reports for all details, in brief -   CT ABDOMEN PELVIS W CONTRAST  Result Date: 12/14/2020 CLINICAL DATA:  Generalized abdominal pain, weakness, diarrhea for 2 days EXAM: CT ABDOMEN AND PELVIS WITH CONTRAST TECHNIQUE: Multidetector CT imaging of the abdomen and pelvis was performed using the standard protocol following bolus administration of intravenous contrast. CONTRAST:  67m OMNIPAQUE IOHEXOL 350 MG/ML SOLN COMPARISON:  None. FINDINGS: Lower chest: No acute pleural or parenchymal lung disease. The heart is enlarged with trace pericardial fluid. Multi lead pacer is noted. Hepatobiliary: No focal liver abnormality is seen. No gallstones, gallbladder wall thickening, or biliary dilatation. Pancreas: There is diffuse pancreatic atrophy, with innumerable small pancreatic cystic lesions measuring up to 10 mm in size, reference image 27/2. Spleen: Normal in size without focal abnormality. Adrenals/Urinary Tract: Upper pole left renal cyst. Otherwise the kidneys enhance normally and symmetrically. No urinary tract calculi or obstructive uropathy. Mild nonspecific adrenal thickening.  The bladder is unremarkable. Stomach/Bowel: There is segmental wall thickening of the rectosigmoid colon which could reflect inflammatory or infectious colitis. Mild nonspecific distension of the distal small bowel and proximal colon may reflect an element of ileus.  No high-grade obstruction. The stomach is decompressed. Vascular/Lymphatic: There is extensive atherosclerosis throughout the aorta and its branches. No aneurysm or dissection. No pathologic adenopathy within the abdomen or pelvis.  Reproductive: Prostate is not enlarged. Evaluation is limited due to streak artifact from a left hip arthroplasty. Other: Small volume ascites is seen primarily within the left upper quadrant surrounding the spleen, nonspecific. No free intraperitoneal gas. No abdominal wall hernia. Musculoskeletal: Unremarkable left hip arthroplasty. No acute or destructive bony lesions. Reconstructed images demonstrate no additional findings. IMPRESSION: 1. Segmental wall thickening of the distal rectosigmoid colon, which may reflect inflammatory or infectious colitis. 2. Mild gaseous distension of the proximal colon and distal small bowel, which could reflect an element of ileus. No high-grade bowel obstruction. 3. Nonspecific free fluid left upper quadrant abdomen. 4. Numerous small pancreatic cysts, largest measuring 10 mm. Recommend follow up pre and post contrast MRI/MRCP or pancreatic protocol CT in 2 years. This recommendation follows ACR consensus guidelines: Management of Incidental Pancreatic Cysts: A White Paper of the ACR Incidental Findings Committee. J Am Coll Radiol 7517;00:174-944. 5. Aortic Atherosclerosis (ICD10-I70.0). Coronary artery atherosclerosis. Electronically Signed   By: Randa Ngo M.D.   On: 12/14/2020 16:53   DG CHEST PORT 1 VIEW  Result Date: 12/15/2020 CLINICAL DATA:  Dyspnea EXAM: PORTABLE CHEST 1 VIEW COMPARISON:  12/14/2020, 04/19/2020 FINDINGS: Low lung volumes. Left-sided pacing device as before. Mild cardiomegaly. Patchy opacity in the right mid lung and left base with partial consolidation at the medial left base. No pleural effusion. Aortic atherosclerosis. IMPRESSION: 1. Low lung volumes with cardiomegaly. 2. Patchy airspace opacities in the bilateral lungs with partial consolidation in medial left base, suggestive of pneumonia Electronically Signed   By: Donavan Foil M.D.   On: 12/15/2020 15:13   DG Chest Port 1 View  Result Date: 12/14/2020 CLINICAL DATA:  Weakness. EXAM:  PORTABLE CHEST 1 VIEW COMPARISON:  Chest x-ray dated April 20, 2019. FINDINGS: Unchanged left chest wall pacemaker and mild cardiomegaly. Low lung volumes. No focal consolidation, pleural effusion, or pneumothorax. No acute osseous abnormality. Borderline dilated small bowel loops in the upper abdomen, please see separate CT abdomen pelvis report from same day. IMPRESSION: 1. No active disease. Electronically Signed   By: Titus Dubin M.D.   On: 12/14/2020 18:50    Micro Results  Recent Results (from the past 240 hour(s))  Blood culture (routine x 2)     Status: None (Preliminary result)   Collection Time: 12/14/20  6:00 PM   Specimen: BLOOD LEFT HAND  Result Value Ref Range Status   Specimen Description BLOOD LEFT HAND  Final   Special Requests   Final    Blood Culture adequate volume BOTTLES DRAWN AEROBIC AND ANAEROBIC   Culture   Final    NO GROWTH 2 DAYS Performed at [facility], [address], [place], [place] [address]    Report Status PENDING  Incomplete  Blood culture (routine x 2)     Status: None (Preliminary result)   Collection Time: 12/14/20  6:00 PM   Specimen: BLOOD RIGHT ARM  Result Value Ref Range Status   Specimen Description BLOOD RIGHT ARM  Final   Special Requests   Final    Blood Culture adequate volume BOTTLES DRAWN AEROBIC AND ANAEROBIC   Culture   Final    NO GROWTH 2 DAYS Performed at [facility], [address] [place], [place] [address]    Report Status PENDING  Incomplete  Resp Panel by RT-PCR (  Flu A&B, Covid) Nasopharyngeal Swab     Status: None   Collection Time: 12/14/20  7:15 PM   Specimen: Nasopharyngeal Swab; Nasopharyngeal(NP) swabs in vial transport medium  Result Value Ref Range Status   SARS Coronavirus 2 by RT PCR NEGATIVE NEGATIVE Final    Comment: (NOTE) SARS-CoV-2 target nucleic acids are NOT DETECTED.  The SARS-CoV-2 RNA is generally detectable in upper respiratory specimens during the acute phase of infection. The  lowest concentration of SARS-CoV-2 viral copies this assay can detect is 138 copies/mL. A negative result does not preclude SARS-Cov-2 infection and should not be used as the sole basis for treatment or other patient management decisions. A negative result may occur with  improper specimen collection/handling, submission of specimen other than nasopharyngeal swab, presence of viral mutation(s) within the areas targeted by this assay, and inadequate number of viral copies(<138 copies/mL). A negative result must be combined with clinical observations, patient history, and epidemiological information. The expected result is Negative.  Fact Sheet for Patients:  EntrepreneurPulse.com.au  Fact Sheet for Healthcare Providers:  IncredibleEmployment.be  This test is no t yet approved or cleared by the [place] FDA and  has been authorized for detection and/or diagnosis of SARS-CoV-2 by FDA under an Emergency Use Authorization (EUA). This EUA will remain  in effect (meaning this test can be used) for the duration of the COVID-19 declaration under Section 564(b)(1) of the Act, 21 U.S.C.section 360bbb-3(b)(1), unless the authorization is terminated  or revoked sooner.       Influenza A by PCR NEGATIVE NEGATIVE Final   Influenza B by PCR NEGATIVE NEGATIVE Final    Comment: (NOTE) The Xpert Xpress SARS-CoV-2/FLU/RSV plus assay is intended as an aid in the diagnosis of influenza from Nasopharyngeal swab specimens and should not be used as a sole basis for treatment. Nasal washings and aspirates are unacceptable for Xpert Xpress SARS-CoV-2/FLU/RSV testing.  Fact Sheet for Patients: EntrepreneurPulse.com.au  Fact Sheet for Healthcare Providers: IncredibleEmployment.be  This test is not yet approved or cleared by the [place] FDA and has been authorized for detection and/or diagnosis of SARS-CoV-2 by FDA under  an Emergency Use Authorization (EUA). This EUA will remain in effect (meaning this test can be used) for the duration of the COVID-19 declaration under Section 564(b)(1) of the Act, 21 U.S.C. section 360bbb-3(b)(1), unless the authorization is terminated or revoked.  Performed at [facility], [address] [place], [place] [address]   C Difficile Quick Screen w PCR reflex     Status: Abnormal   Collection Time: 12/16/20 10:50 AM   Specimen: STOOL  Result Value Ref Range Status   C Diff antigen POSITIVE (A) NEGATIVE Final   C Diff toxin NEGATIVE NEGATIVE Final   C Diff interpretation Results are indeterminate. See PCR results.  Final    Comment: Performed at [facility], [address], [place], [place] [address]       Today   Subjective    Nathan Alvarez today has no new complaints  -Patient has had only 1 episode of loose stools in the last 24 hours -Okay to discharge home on oral regimen as outlined above   No fever  Or chills  No nausea or vomiting, tolerating oral intake well, - ambulated with physical therapist        Patient has been seen and examined prior to discharge   Objective   Blood pressure 118/62, pulse 79, temperature 97.9 F (36.6 C), temperature source Oral, resp. rate 18, height  5' 6"  (1.676 m), weight 64.9 kg, SpO2 92 %.   Intake/Output Summary (Last 24 hours) at 12/16/2020 1334 Last data filed at 12/16/2020 0656 Gross per 24 hour  Intake 1747.27 ml  Output 500 ml  Net 1247.27 ml    Exam   Gen:- Awake Alert,   frail appearing, in no acute distress HEENT:- [place].AT, No sclera icterus Neck-Supple Neck,No JVD,.  Lungs-improved air movement without wheezing CV- S1, S2 normal, regular  Abd-  +ve B.Sounds, Abd Soft, ND, nontender  extremity/Skin:- No  edema, pedal pulses present  Psych-affect is appropriate, oriented x3 Neuro-generalized weakness, no new focal deficits, no tremors   Data Review   CBC w Diff:  Lab Results  Component Value Date    WBC 7.1 12/16/2020   HGB 12.0 (L) 12/16/2020   HCT 35.3 (L) 12/16/2020   PLT 154 12/16/2020   LYMPHOPCT 7 12/14/2020   BANDSPCT 4 12/14/2020   MONOPCT 5 12/14/2020   EOSPCT 0 12/14/2020   BASOPCT 0 12/14/2020    CMP:  Lab Results  Component Value Date   NA 138 12/16/2020   NA 133 (L) 09/21/2017   K 3.6 12/16/2020   CL 108 12/16/2020   CO2 24 12/16/2020   BUN 54 (H) 12/16/2020   BUN 27 09/21/2017   CREATININE 1.01 12/16/2020   PROT 6.4 (L) 12/14/2020   ALBUMIN 2.6 (L) 12/16/2020   BILITOT 1.1 12/14/2020   ALKPHOS 65 12/14/2020   AST 25 12/14/2020   ALT 19 12/14/2020  .   Total Discharge time is about 33 minutes  Roxan Hockey M.D on 12/16/2020 at 1:34 PM  Go to [email or web address] -  for contact info  [facility] - Office  [phone]

## 2020-12-16 NOTE — TOC Transition Note (Signed)
Transition of Care [facility]) - CM/SW Discharge Note   Patient Details  Name: Nathan Alvarez MRN: [id number] Date of Birth: Oct 07, 1924  Transition of Care [facility]) CM/SW Contact:  Barry Brunner, LCSW Phone Number: 12/16/2020, 1:21 PM   Clinical Narrative:    CSW notified of patient's readiness for discharge. Patient's daughter report that she was not able to take patient home and would like to hold patient at hospital for placement. Patient's daughter reported that she was out of town and reported that her husband reported that he did not feel like he is able to take care of the patient due to patient needing assistance with ADL's. CSW notified patient's daughter that [facility] would not be able to hold patient for SNF placement given that patient was able to walk 150 ft in the last PT note. CSW also informed patient's daughter that the hospital was not able to hold patient for other placement such as ALF without patient having a meeting medical inpatient criteria. Patient's daughter expressed that she is out of town and that her husband did not feel that he would be able to pick patient up upon discharge. CSW informed that patient's daughter reported that the family planned to be out of town for 2 weeks, but patient's husband would be home. CSW notified patient's daughter that if they refused to pick patient up that CSW would be required to contact APS due to families refusal to care for patient upon discharge. Patient's daughter agreeable to have her husband pick up patient and work on placement out patient. CSW inquired if family is agreeable to [facility] referral. Patient's daughter agreeable. CSW referred patient to [place] with [facility]. Denyse Amass with [facility] agreeable to provide services to patient. TOC signing off.    Final next level of care: Home w Home Health Services Barriers to Discharge: Barriers Resolved   Patient Goals and CMS Choice Patient states their goals for this hospitalization and ongoing  recovery are:: Return home with [facility] CMS Medicare.gov Compare Post Acute Care list provided to:: Patient Choice offered to / list presented to : Patient  Discharge Placement                    Patient and family notified of of transfer: 12/16/20  Discharge Plan and Services                          HH Arranged: RN, PT, Social Work, Nurse's Aide HH Agency: [facility] Date [facility] Agency Contacted: 12/16/20 Time HH Agency Contacted: 1321 Representative spoke with at [facility] Agency: Denyse Amass  Social Determinants of Health (SDOH) Interventions     Readmission Risk Interventions No flowsheet data found.

## 2020-12-16 NOTE — Evaluation (Signed)
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  Patient states this started yesterday, states the pain came on around midmorning, states the pain is in his lower abdomen, does not radiate, states he had a 1 episode of vomiting denies coffee-ground emesis or hematemesis, states has had multiple bouts of diarrhea, denies melena and/or hematochezia, denies  mucousy stools.  He has had no recent antibiotics, no recent stents in the hospital, has no history of C. difficile.  Patient has no significant abdominal history, no history of bowel obstructions, pancreatitis, or diverticulitis.  He has no associated fevers, chills, nasal ingestion, sore throat, cough, chest pain, shortness of breath, general body aches, denies any sick contacts, he is not immunocompromise.  He has no other complaints at this time.  Clinical Impression  Patient very agreeable to therapy. At start of session noticed patient had soiled himself x2. Patient able to stand with unilateral upper extremity support for 3 minutes while nursing cleaned up patient. Able to perform all bed mobilities with modified independence and ambulated 150 feet with RW and supervision. A little unsteady of feet but no loss of balance or deviation from projected course.  Patient will continue to benefit from skilled physical therapy in hospital and recommended venue below to increase strength, balance, endurance for safe ADLs and gait.     Follow Up Recommendations Home health PT    Equipment Recommendations  None recommended by PT    Recommendations for Other Services       Precautions / Restrictions Restrictions Weight Bearing Restrictions: No       Mobility  Bed Mobility Overal bed mobility: Modified Independent             General bed mobility comments: slow labored movements    Transfers Overall transfer level: Modified independent Equipment used: Rolling walker (2 wheeled)             General transfer comment: slow labored movements  Ambulation/Gait Ambulation/Gait assistance: Supervision Gait Distance (Feet): 150 Feet Assistive device: Rolling walker (2 wheeled) Gait Pattern/deviations: Decreased stride length Gait velocity: reduced   General Gait Details: slow labored gait  Stairs            Wheelchair Mobility    Modified Rankin (Stroke Patients Only)       Balance Overall balance assessment: Modified Independent                                           Pertinent Vitals/Pain Pain Assessment: No/denies pain    Home Living Family/patient expects to be discharged to:: Private residence Living Arrangements: Alone Available Help at Discharge: Family;Available PRN/intermittently Type of Home: House Home Access: Stairs to enter Entrance Stairs-Rails: Right Entrance Stairs-Number of Steps: 2 Home Layout: One level Home Equipment: Walker - 2 wheels;Shower seat;Grab bars - tub/shower Additional Comments: rollator    Prior Function Level of Independence: Independent with assistive device(s)         Comments: ambulates with RW in house adn rollator in community, still drives     Hand Dominance        Extremity/Trunk Assessment        Lower  Extremity Assessment Lower Extremity Assessment: Generalized weakness    Cervical / Trunk Assessment Cervical / Trunk Assessment: Kyphotic  Communication   Communication: HOH  Cognition Arousal/Alertness: Awake/alert   Overall Cognitive Status: Within Functional Limits for tasks assessed                                        General Comments      Exercises General Exercises - Lower  Extremity Ankle Circles/Pumps: AROM;Both;10 reps Heel Slides: AROM;Supine;10 reps;Strengthening;Both Straight Leg Raises: AROM;Supine;10 reps;Right;Strengthening   Assessment/Plan    PT Assessment Patient needs continued PT services  PT Problem List Decreased strength;Decreased activity tolerance;Decreased balance       PT Treatment Interventions DME instruction;Gait training;Stair training;Therapeutic activities;Therapeutic exercise;Patient/family education;Neuromuscular re-education;Balance training    PT Goals (Current goals can be found in the Care Plan section)  Acute Rehab PT Goals Patient Stated Goal: to go home PT Goal Formulation: With patient Time For Goal Achievement: 12/30/20 Potential to Achieve Goals: Good    Frequency Min 3X/week   Barriers to discharge   none at this time    Co-evaluation               AM-PAC PT "6 Clicks" Mobility  Outcome Measure Help needed turning from your back to your side while in a flat bed without using bedrails?: None Help needed moving from lying on your back to sitting on the side of a flat bed without using bedrails?: None Help needed moving to and from a bed to a chair (including a wheelchair)?: None Help needed standing up from a chair using your arms (e.g., wheelchair or bedside chair)?: None Help needed to walk in hospital room?: None Help needed climbing 3-5 steps with a railing? : A Little 6 Click Score: 23    End of Session Equipment Utilized During Treatment: Gait belt Activity Tolerance: Patient tolerated treatment well Patient left: in bed;with call bell/phone within reach;with nursing/sitter in room;with bed alarm set Nurse Communication: Mobility status PT Visit Diagnosis: Unsteadiness on feet (R26.81);Muscle weakness (generalized) (M62.81)    Time: 4193-7902 PT Time Calculation (min) (ACUTE ONLY): 34 min   Charges:   PT Evaluation $PT Eval Low Complexity: 1 Low PT Treatments $Therapeutic Exercise:  8-22 mins      11:08 AM, 12/16/20 Tereasa Coop, DPT Physical Therapy with [facility]  [phone] office

## 2020-12-16 NOTE — Evaluation (Signed)
Clinical/Bedside Swallow Evaluation Patient Details  Name: Nathan Alvarez MRN: [id number] Date of Birth: 1924-12-10  Today's Date: 12/16/2020 Time: SLP Start Time (ACUTE ONLY): 0850 SLP Stop Time (ACUTE ONLY): 0914 SLP Time Calculation (min) (ACUTE ONLY): 24 min  Past Medical History:  Past Medical History:  Diagnosis Date   A-fib (HCC)    Dizziness    DM (diabetes mellitus) (HCC)    Esophagitis, reflux    Heart block    Hyperlipidemia    Hypertension, essential, benign    Memory deficit    Pathological fracture of left hip due to age-related osteoporosis (HCC) 04/25/2019   SOB (shortness of breath)    SSS (sick sinus syndrome) (HCC)    Past Surgical History:  Past Surgical History:  Procedure Laterality Date   CATARACT EXTRACTION Right    HIP ARTHROPLASTY Left 04/22/2019   Procedure: ARTHROPLASTY BIPOLAR HIP (HEMIARTHROPLASTY);  Surgeon: Myrene Galas, MD;  Location: [facility] OR;  Service: Orthopedics;  Laterality: Left;   INGUINAL HERNIA REPAIR     INSERT / REPLACE / REMOVE PACEMAKER     PACEMAKER INSERTION     TONSILLECTOMY AND ADENOIDECTOMY     HPI:  87 y.o. male with pmhx relevant for DM2 and HTN and BPH presents with generalized weakness, fevers , abdominal pain and diarrhea for 2 days PTA, initially admitted on 12/14/2020. On 12/15/2020 there was concern for possible aspiration, repeat chest x-ray suggest possible aspiration pneumonia. BSE requested.   Assessment / Plan / Recommendation Clinical Impression  Clinical swallow evaluation completed at bedside. Pt alert and cooperative and denies difficulty with swallowing. His vocal quality and cough are strong, however does exhibit some shallow breathing. Pt consumed thin via cup and straw, puree, mech soft and regular textures, and NTL all self presented. Pt without overt signs or symptoms of aspiration this date, but does present with impaired mastication and lingual movement with solids resulting in prolonged oral transit and left  lingual/buccal residuals cleared with cues for sips of liquids. Recommend D2 and thin liquids when Pt is alert and upright and po medications whole in puree. SLP will follow during acute stay. Above to RN. SLP Visit Diagnosis: Dysphagia, unspecified (R13.10)    Aspiration Risk  Mild aspiration risk    Diet Recommendation Dysphagia 2 (Fine chop);Thin liquid   Liquid Administration via: Cup;Straw Medication Administration: Whole meds with puree Supervision: Patient able to self feed;Intermittent supervision to cue for compensatory strategies Compensations: Slow rate;Lingual sweep for clearance of pocketing Postural Changes: Seated upright at 90 degrees;Remain upright for at least 30 minutes after po intake    Other  Recommendations Oral Care Recommendations: Oral care BID;Staff/trained caregiver to provide oral care Other Recommendations: Clarify dietary restrictions   Follow up Recommendations 24 hour supervision/assistance      Frequency and Duration min 2x/week  1 week       Prognosis Prognosis for Safe Diet Advancement: Fair      Swallow Study   General Date of Onset: 12/14/20 HPI: 87 y.o. male with pmhx relevant for DM2 and HTN and BPH presents with generalized weakness, fevers , abdominal pain and diarrhea for 2 days PTA, initially admitted on 12/14/2020. On 12/15/2020 there was concern for possible aspiration, repeat chest x-ray suggest possible aspiration pneumonia. BSE requested. Type of Study: Bedside Swallow Evaluation Previous Swallow Assessment: N/A Diet Prior to this Study: Dysphagia 1 (puree);Thin liquids Temperature Spikes Noted: No Respiratory Status: Room air History of Recent Intubation: No Behavior/Cognition: Alert;Cooperative;Pleasant mood Oral Cavity Assessment: Within Functional Limits  Oral Care Completed by SLP: Recent completion by staff Oral Cavity - Dentition: Missing dentition;Poor condition Vision: Functional for self-feeding Self-Feeding Abilities:  Able to feed self Patient Positioning: Upright in bed Baseline Vocal Quality: Normal Volitional Cough: Strong Volitional Swallow: Able to elicit    Oral/Motor/Sensory Function Overall Oral Motor/Sensory Function: Within functional limits   Ice Chips Ice chips: Within functional limits Presentation: Spoon   Thin Liquid Thin Liquid: Within functional limits Presentation: Cup;Self Fed;Straw    Nectar Thick Nectar Thick Liquid: Within functional limits Presentation: Cup;Self Fed   Honey Thick Honey Thick Liquid: Not tested   Puree Puree: Within functional limits Presentation: Self Fed;Spoon   Solid     Solid: Impaired Presentation: Self Fed Oral Phase Impairments: Reduced lingual movement/coordination;Impaired mastication Oral Phase Functional Implications: Left lateral sulci pocketing;Prolonged oral transit;Oral residue     Thank you,  [name], CCC-SLP [phone]  [name] 12/16/2020,9:54 AM

## 2020-12-17 LAB — GASTROINTESTINAL PANEL BY PCR, STOOL (REPLACES STOOL CULTURE)

## 2020-12-19 LAB — CULTURE, BLOOD (ROUTINE X 2)
Culture: NO GROWTH
Culture: NO GROWTH
Special Requests: ADEQUATE
Special Requests: ADEQUATE

## 2021-01-11 ENCOUNTER — Telehealth: Payer: Self-pay | Admitting: Internal Medicine

## 2021-01-11 NOTE — Telephone Encounter (Signed)
Pt c/o BP issue: STAT if pt c/o blurred vision, one-sided weakness or slurred speech  1. What are your last 5 BP readings?  d110/72; 87 136/75; 65 99/56; 67 150/65; 65 133/82; 98  2. Are you having any other symptoms (ex. Dizziness, headache, blurred vision, passed out)? Dizziness   3. What is your BP issue? BP readings that was missing

## 2021-01-11 NOTE — Telephone Encounter (Signed)
Call returned to daughter  Advised BP good.  Agree with med changes.  If continued dizziness -check bp right then and report if systolic less than 100

## 2021-01-20 ENCOUNTER — Other Ambulatory Visit: Payer: Self-pay

## 2021-01-20 ENCOUNTER — Emergency Department (HOSPITAL_BASED_OUTPATIENT_CLINIC_OR_DEPARTMENT_OTHER): Payer: Medicare Other | Admitting: Radiology

## 2021-01-20 ENCOUNTER — Inpatient Hospital Stay (HOSPITAL_BASED_OUTPATIENT_CLINIC_OR_DEPARTMENT_OTHER)
Admission: EM | Admit: 2021-01-20 | Discharge: 2021-01-25 | DRG: 195 | Disposition: A | Discharge status: Skilled Nursing Facility | Payer: Medicare Other | Attending: Internal Medicine | Admitting: Internal Medicine

## 2021-01-20 ENCOUNTER — Encounter (HOSPITAL_BASED_OUTPATIENT_CLINIC_OR_DEPARTMENT_OTHER): Payer: Self-pay | Admitting: Obstetrics and Gynecology

## 2021-01-20 ENCOUNTER — Emergency Department (HOSPITAL_BASED_OUTPATIENT_CLINIC_OR_DEPARTMENT_OTHER): Payer: Medicare Other

## 2021-01-20 DIAGNOSIS — Z6820 Body mass index (BMI) 20.0-20.9, adult: Secondary | ICD-10-CM

## 2021-01-20 DIAGNOSIS — Z7982 Long term (current) use of aspirin: Secondary | ICD-10-CM

## 2021-01-20 DIAGNOSIS — J13 Pneumonia due to Streptococcus pneumoniae: Principal | ICD-10-CM | POA: Diagnosis present

## 2021-01-20 DIAGNOSIS — Z20822 Contact with and (suspected) exposure to covid-19: Secondary | ICD-10-CM | POA: Diagnosis present

## 2021-01-20 DIAGNOSIS — L89152 Pressure ulcer of sacral region, stage 2: Secondary | ICD-10-CM | POA: Diagnosis present

## 2021-01-20 DIAGNOSIS — R0602 Shortness of breath: Secondary | ICD-10-CM

## 2021-01-20 DIAGNOSIS — Z87891 Personal history of nicotine dependence: Secondary | ICD-10-CM

## 2021-01-20 DIAGNOSIS — I495 Sick sinus syndrome: Secondary | ICD-10-CM | POA: Diagnosis present

## 2021-01-20 DIAGNOSIS — D649 Anemia, unspecified: Secondary | ICD-10-CM | POA: Diagnosis present

## 2021-01-20 DIAGNOSIS — E785 Hyperlipidemia, unspecified: Secondary | ICD-10-CM | POA: Diagnosis present

## 2021-01-20 DIAGNOSIS — R5383 Other fatigue: Secondary | ICD-10-CM

## 2021-01-20 DIAGNOSIS — Z880 Allergy status to penicillin: Secondary | ICD-10-CM

## 2021-01-20 DIAGNOSIS — E119 Type 2 diabetes mellitus without complications: Secondary | ICD-10-CM

## 2021-01-20 DIAGNOSIS — I4891 Unspecified atrial fibrillation: Secondary | ICD-10-CM | POA: Diagnosis present

## 2021-01-20 DIAGNOSIS — F32A Depression, unspecified: Secondary | ICD-10-CM | POA: Diagnosis present

## 2021-01-20 DIAGNOSIS — R531 Weakness: Secondary | ICD-10-CM

## 2021-01-20 DIAGNOSIS — Z79899 Other long term (current) drug therapy: Secondary | ICD-10-CM

## 2021-01-20 DIAGNOSIS — J189 Pneumonia, unspecified organism: Secondary | ICD-10-CM | POA: Diagnosis present

## 2021-01-20 DIAGNOSIS — W19XXXA Unspecified fall, initial encounter: Secondary | ICD-10-CM

## 2021-01-20 DIAGNOSIS — Z8249 Family history of ischemic heart disease and other diseases of the circulatory system: Secondary | ICD-10-CM

## 2021-01-20 DIAGNOSIS — I10 Essential (primary) hypertension: Secondary | ICD-10-CM | POA: Diagnosis present

## 2021-01-20 DIAGNOSIS — Z96642 Presence of left artificial hip joint: Secondary | ICD-10-CM | POA: Diagnosis present

## 2021-01-20 DIAGNOSIS — I459 Conduction disorder, unspecified: Secondary | ICD-10-CM | POA: Diagnosis present

## 2021-01-20 DIAGNOSIS — L8990 Pressure ulcer of unspecified site, unspecified stage: Secondary | ICD-10-CM | POA: Insufficient documentation

## 2021-01-20 DIAGNOSIS — R627 Adult failure to thrive: Secondary | ICD-10-CM | POA: Diagnosis present

## 2021-01-20 DIAGNOSIS — Z7984 Long term (current) use of oral hypoglycemic drugs: Secondary | ICD-10-CM

## 2021-01-20 DIAGNOSIS — Z950 Presence of cardiac pacemaker: Secondary | ICD-10-CM | POA: Diagnosis present

## 2021-01-20 LAB — URINALYSIS, ROUTINE W REFLEX MICROSCOPIC
Bilirubin Urine: NEGATIVE
Glucose, UA: NEGATIVE mg/dL
Hgb urine dipstick: NEGATIVE
Ketones, ur: NEGATIVE mg/dL
Leukocytes,Ua: NEGATIVE
Nitrite: NEGATIVE
Protein, ur: 30 mg/dL — AB
Specific Gravity, Urine: 1.026 (ref 1.005–1.030)
pH: 6 (ref 5.0–8.0)

## 2021-01-20 LAB — COMPREHENSIVE METABOLIC PANEL
ALT: 5 U/L (ref 0–44)
AST: 11 U/L — ABNORMAL LOW (ref 15–41)
Albumin: 2.6 g/dL — ABNORMAL LOW (ref 3.5–5.0)
Alkaline Phosphatase: 66 U/L (ref 38–126)
Anion gap: 9 (ref 5–15)
BUN: 36 mg/dL — ABNORMAL HIGH (ref 8–23)
CO2: 24 mmol/L (ref 22–32)
Calcium: 7.8 mg/dL — ABNORMAL LOW (ref 8.9–10.3)
Chloride: 103 mmol/L (ref 98–111)
Creatinine, Ser: 0.81 mg/dL (ref 0.61–1.24)
GFR, Estimated: >60 mL/min (ref >60)
Glucose, Bld: 161 mg/dL — ABNORMAL HIGH (ref 70–99)
Potassium: 3.4 mmol/L — ABNORMAL LOW (ref 3.5–5.1)
Sodium: 136 mmol/L (ref 135–145)
Total Bilirubin: 0.6 mg/dL (ref 0.3–1.2)
Total Protein: 5.2 g/dL — ABNORMAL LOW (ref 6.5–8.1)

## 2021-01-20 LAB — CBC WITH DIFFERENTIAL/PLATELET
Abs Immature Granulocytes: 0.06 10*3/uL (ref 0.00–0.07)
Basophils Absolute: 0 10*3/uL (ref 0.0–0.1)
Basophils Relative: 0 %
Eosinophils Absolute: 0 10*3/uL (ref 0.0–0.5)
Eosinophils Relative: 1 %
HCT: 31.8 % — ABNORMAL LOW (ref 39.0–52.0)
Hemoglobin: 10.6 g/dL — ABNORMAL LOW (ref 13.0–17.0)
Immature Granulocytes: 1 %
Lymphocytes Relative: 41 %
Lymphs Abs: 3.2 10*3/uL (ref 0.7–4.0)
MCH: 28.8 pg (ref 26.0–34.0)
MCHC: 33.3 g/dL (ref 30.0–36.0)
MCV: 86.4 fL (ref 80.0–100.0)
Monocytes Absolute: 0.7 10*3/uL (ref 0.1–1.0)
Monocytes Relative: 9 %
Neutro Abs: 3.8 10*3/uL (ref 1.7–7.7)
Neutrophils Relative %: 48 %
Platelets: 266 10*3/uL (ref 150–400)
RBC: 3.68 MIL/uL — ABNORMAL LOW (ref 4.22–5.81)
RDW: 16.8 % — ABNORMAL HIGH (ref 11.5–15.5)
WBC: 7.7 10*3/uL (ref 4.0–10.5)
nRBC: 0 % (ref 0.0–0.2)

## 2021-01-20 LAB — RESP PANEL BY RT-PCR (FLU A&B, COVID) ARPGX2
Influenza A by PCR: NEGATIVE
Influenza B by PCR: NEGATIVE
SARS Coronavirus 2 by RT PCR: NEGATIVE

## 2021-01-20 LAB — AMMONIA: Ammonia: 26 umol/L (ref 9–35)

## 2021-01-20 LAB — CK: Total CK: 27 U/L — ABNORMAL LOW (ref 49–397)

## 2021-01-20 MED ORDER — SODIUM CHLORIDE 0.9 % IV SOLN
1.0000 g | Freq: Once | INTRAVENOUS | Status: AC
  Administered 2021-01-21: 1 g via INTRAVENOUS
  Filled 2021-01-20: qty 10

## 2021-01-20 MED ORDER — SODIUM CHLORIDE 0.9 % IV BOLUS
1000.0000 mL | Freq: Once | INTRAVENOUS | Status: AC
  Administered 2021-01-20: 1000 mL via INTRAVENOUS

## 2021-01-20 MED ORDER — SODIUM CHLORIDE 0.9 % IV SOLN
500.0000 mg | Freq: Once | INTRAVENOUS | Status: AC
  Administered 2021-01-21: 500 mg via INTRAVENOUS
  Filled 2021-01-20: qty 500

## 2021-01-20 NOTE — ED Triage Notes (Signed)
Patient reports to the ER for Falls. Patient is planned to be going into an assisted living. Patient's family is concerned regarding dehydration. Denies hx of dementia

## 2021-01-20 NOTE — ED Provider Notes (Signed)
[facility] EMERGENCY DEPT Provider Note   CSN: [id number] Arrival date & time: 01/20/21  2033     History Chief Complaint  Patient presents with   Marletta Lor    Nathan Alvarez is a 87 y.o. male.  The history is provided by the patient, medical records and a relative. No language interpreter was used.  Fall This is a new problem. The current episode started 6 to 12 hours ago. The problem occurs rarely. The problem has not changed since onset.Associated symptoms include chest pain. Pertinent negatives include no abdominal pain, no headaches and no shortness of breath. Nothing aggravates the symptoms. Nothing relieves the symptoms. He has tried nothing for the symptoms. The treatment provided no relief.      Past Medical History:  Diagnosis Date   A-fib (HCC)    Dizziness    DM (diabetes mellitus) (HCC)    Esophagitis, reflux    Heart block    Hyperlipidemia    Hypertension, essential, benign    Memory deficit    Pathological fracture of left hip due to age-related osteoporosis (HCC) 04/25/2019   SOB (shortness of breath)    SSS (sick sinus syndrome) [facility])     Patient Active Problem List   Diagnosis Date Noted   Aspiration pneumonia (HCC) 12/15/2020   Sepsis due to aspiration pneumonia and enterocolitis 12/15/2020   AKI (acute kidney injury) (HCC) 12/14/2020   Enterocolitis-C diff test is Equivocal  12/14/2020   Sinus node dysfunction (HCC) 03/06/2020   Pacemaker 03/06/2020   Vitamin D deficiency 04/25/2019   Pathological fracture of left hip due to age-related osteoporosis (HCC) 04/25/2019   Femoral neck fracture (HCC) 04/21/2019   Hypertension 04/21/2019   Diabetes (HCC) 04/21/2019   Hyponatremia 04/21/2019   Anemia 04/21/2019    Past Surgical History:  Procedure Laterality Date   CATARACT EXTRACTION Right    HIP ARTHROPLASTY Left 04/22/2019   Procedure: ARTHROPLASTY BIPOLAR HIP (HEMIARTHROPLASTY);  Surgeon: Myrene Galas, MD;  Location: [facility];   Service: Orthopedics;  Laterality: Left;   INGUINAL HERNIA REPAIR     INSERT / REPLACE / REMOVE PACEMAKER     PACEMAKER INSERTION     TONSILLECTOMY AND ADENOIDECTOMY         Family History  Problem Relation Age of Onset   Heart disease Mother     Social History   Tobacco Use   Smoking status: Former    Types: Cigarettes    Quit date: 01/01/1956    Years since quitting: 65.0    Passive exposure: Never   Smokeless tobacco: Never  Vaping Use   Vaping Use: Never used  Substance Use Topics   Alcohol use: No   Drug use: No    Home Medications Prior to Admission medications   Medication Sig Start Date End Date Taking? Authorizing Provider  acetaminophen (TYLENOL) 325 MG tablet Take 2 tablets (650 mg total) by mouth every 6 (six) hours as needed for mild pain (or Fever >/= 101). 12/16/20   Shon Hale, MD  aspirin EC 81 MG EC tablet Take 1 tablet (81 mg total) by mouth daily with breakfast. Swallow whole. 12/16/20   Shon Hale, MD  calcium carbonate (OS-CAL - DOSED IN MG OF ELEMENTAL CALCIUM) 1250 (500 Ca) MG tablet Take 1 tablet (500 mg of elemental calcium total) by mouth 2 (two) times daily with a meal. 12/16/20   Emokpae, Courage, MD  carvedilol (COREG) 6.25 MG tablet Take 1 tablet (6.25 mg total) by mouth 2 (two) times daily.  For BP and Heart 12/16/20   Shon Hale, MD  Cholecalciferol (VITAMIN D) 125 MCG (5000 UT) CAPS Take 1 capsule by mouth daily. 12/16/20   Shon Hale, MD  diclofenac Sodium (VOLTAREN) 1 % GEL Apply 4 g topically every 8 (eight) hours as needed (pain). 09/17/20   [provider]  Ferrous Sulfate (IRON) 325 (65 Fe) MG TABS Take 28 mg by mouth daily.    [provider]  JANUVIA 50 MG tablet Take 1 tablet (50 mg total) by mouth daily. 12/16/20   Shon Hale, MD  mirtazapine (REMERON) 7.5 MG tablet Take 1 tablet (7.5 mg total) by mouth at bedtime. For appetite stimulation and mood 12/16/20   Shon Hale, MD  tamsulosin  (FLOMAX) 0.4 MG CAPS capsule Take 1 capsule (0.4 mg total) by mouth daily. 12/16/20   Shon Hale, MD    Allergies    Penicillins  Review of Systems   Review of Systems  Constitutional:  Positive for fatigue. Negative for diaphoresis and fever.  HENT:  Negative for congestion.   Eyes:  Negative for visual disturbance.  Respiratory:  Positive for chest tightness. Negative for cough, shortness of breath and wheezing.   Cardiovascular:  Positive for chest pain. Negative for palpitations and leg swelling.  Gastrointestinal:  Positive for diarrhea. Negative for abdominal pain, constipation, nausea and vomiting.  Genitourinary:  Negative for dysuria and flank pain.  Musculoskeletal:  Negative for back pain, neck pain and neck stiffness.  Skin:  Negative for rash and wound.  Neurological:  Positive for light-headedness. Negative for dizziness, weakness and headaches.  Psychiatric/Behavioral:  Negative for agitation.   All other systems reviewed and are negative.  Physical Exam Updated Vital Signs BP (!) 150/136 (BP Location: Left Arm)   Pulse 75   Temp 97.7 F (36.5 C)   Resp 18   SpO2 98%   Physical Exam Vitals and nursing note reviewed.  Constitutional:      General: He is not in acute distress.    Appearance: He is well-developed. He is not ill-appearing, toxic-appearing or diaphoretic.  HENT:     Head: Normocephalic and atraumatic.     Mouth/Throat:     Mouth: Mucous membranes are dry.  Eyes:     Extraocular Movements: Extraocular movements intact.     Conjunctiva/sclera: Conjunctivae normal.     Pupils: Pupils are equal, round, and reactive to light.  Cardiovascular:     Rate and Rhythm: Normal rate and regular rhythm.     Heart sounds: No murmur heard. Pulmonary:     Effort: Pulmonary effort is normal. No respiratory distress.     Breath sounds: Rhonchi present. No wheezing or rales.  Chest:     Chest wall: Tenderness present.  Abdominal:     Palpations: Abdomen  is soft.     Tenderness: There is no abdominal tenderness. There is no guarding or rebound.  Musculoskeletal:        General: Tenderness present.     Cervical back: Neck supple.  Skin:    General: Skin is warm and dry.     Capillary Refill: Capillary refill takes less than 2 seconds.     Findings: No erythema.  Neurological:     General: No focal deficit present.     Mental Status: He is alert.     Motor: No weakness.  Psychiatric:        Mood and Affect: Mood normal.    ED Results / Procedures / Treatments   Labs (  all labs ordered are listed, but only abnormal results are displayed) Labs Reviewed  COMPREHENSIVE METABOLIC PANEL - Abnormal; Notable for the following components:      Result Value   Potassium 3.4 (*)    Glucose, Bld 161 (*)    BUN 36 (*)    Calcium 7.8 (*)    Total Protein 5.2 (*)    Albumin 2.6 (*)    AST 11 (*)    All other components within normal limits  CBC WITH DIFFERENTIAL/PLATELET - Abnormal; Notable for the following components:   RBC 3.68 (*)    Hemoglobin 10.6 (*)    HCT 31.8 (*)    RDW 16.8 (*)    All other components within normal limits  URINALYSIS, ROUTINE W REFLEX MICROSCOPIC - Abnormal; Notable for the following components:   Protein, ur 30 (*)    All other components within normal limits  CK - Abnormal; Notable for the following components:   Total CK 27 (*)    All other components within normal limits  RESP PANEL BY RT-PCR (FLU A&B, COVID) [id number]  URINE CULTURE  AMMONIA    EKG EKG Interpretation  Date/Time:  Sunday January 20 2021 21:13:37 EDT Ventricular Rate:  85 PR Interval:  48 QRS Duration: 90 QT Interval:  401 QTC Calculation: 477 R Axis:   -33 Text Interpretation: Wandering atrial pacemaker Probable left atrial enlargement Left axis deviation Borderline prolonged QT interval When compared to prior, similar less t wave inversions. No STEMI Confirmed by Theda Belfast ([id number]) on 01/20/2021 9:30:09 PM  Radiology DG Ribs  Unilateral W/Chest Left  Result Date: 01/20/2021 CLINICAL DATA:  Fall EXAM: LEFT RIBS AND CHEST - 3+ VIEW COMPARISON:  12/15/2020 FINDINGS: Cardiomegaly. Left pacer remains in place, unchanged. Left lower lobe atelectasis or infiltrate. No visible displaced rib fracture. No effusion or pneumothorax. IMPRESSION: No visible displaced rib fracture. Left lower lobe atelectasis or infiltrate. Electronically Signed   By: Charlett Nose M.D.   On: 01/20/2021 22:17   DG Hip Unilat W or Wo Pelvis 2-3 Views Left  Result Date: 01/20/2021 CLINICAL DATA:  Fall EXAM: DG HIP (WITH OR WITHOUT PELVIS) 2-3V LEFT COMPARISON:  04/22/2019 FINDINGS: No acute fracture, subluxation or dislocation. Prior left hip replacement. No hardware complicating feature. IMPRESSION: No acute bony abnormality. Electronically Signed   By: Charlett Nose M.D.   On: 01/20/2021 22:18    Procedures Procedures   Medications Ordered in ED Medications  cefTRIAXone (ROCEPHIN) 1 g in sodium chloride 0.9 % 100 mL IVPB (has no administration in time range)  azithromycin (ZITHROMAX) 500 mg in sodium chloride 0.9 % 250 mL IVPB (has no administration in time range)  sodium chloride 0.9 % bolus 1,000 mL (1,000 mLs Intravenous New Bag/Given 01/20/21 2230)    ED Course  I have reviewed the triage vital signs and the nursing notes.  Pertinent labs & imaging results that were available during my care of the patient were reviewed by me and considered in my medical decision making (see chart for details).    MDM Rules/Calculators/A&P                           Nathan Alvarez is a 87 y.o. male with a past medical history significant for hypertension, diabetes, previous left hip pathologic fracture, sick sinus syndrome status post pacemaker, atrial fibrillation, previous hyponatremia, and recent admission for colitis and pneumonia 3 weeks ago who presents with a rapid decline in energy,  decreased mobility, not eating and drinking, diarrhea, fatigue,  and fall today.  According to son who accompanied patient, patient has not been the same over the last 3 weeks since his recent admission for colitis.  Patient has not been eating or drinking and is getting more fatigued.  Several days ago patient was able to ambulate normally with a walker but over the last 48 hours has gotten the point where if he sits down he cannot get up without help and he fell this morning and had to wait until son arrived to help get him off the floor.  He is unsure how long he was on the ground but does not think it was completely overnight.  Patient is complaining of left lower chest pain and left hip pain after the fall today.  Patient is denying any headache or neck pain and denies any new pain in his back or abdomen.  Denies any pain in extremities.  Son is concerned not only about the rapid decline in the patient's energy but also his decrease in mobility.  He reports that they currently have a bed but are waiting for everything to process for him to get placed in an assisted living facility.  They do have some help that comes at home however it is not constant and given the patient's fall today and not eating and drinking and lack of energy to even get up out of a chair, family is concerned about his safety being discharged tonight.  On my exam, patient is very tender in the left lower chest.  Lungs had some mild rhonchi and there was only slight systolic murmur.  He had intact pulses in extremities.  Left hip was tender to palpation but abdomen otherwise nontender.  Back otherwise nontender.  No lacerations or rashes seen.  Patient has very dry mucous membranes.  Clinical I suspect patient has been more dehydrated since his not eating and drinking since being discharged.  I am also concerned about his decline in mobility over the last 48 hours to the point where he cannot get up out of a chair with him living at home.  We will get work-up with x-rays to look for acute traumatic  injuries in the chest or the hip and will get basic labs to look for occult infection or significant electrolyte abnormality or dehydration.  If significant abnormalities are discovered, anticipate admission however if work-up is completely reassuring, I am still concerned about the safety of him going home with his rapid decline.  If all is reassuring, anticipate engagement with transition of care team to discuss options until he can go to the bed he has already been assigned at an assisted living facility.   Patient's work-up returned and the only significant abnormality was evidence of likely infiltrate in the left lower lobe of the lung.  This is where he is having the pain.  I am concerned that the patient may have some residual or new pneumonia contributing to his fatigue, mobility issues, tachypnea, and fall.  I am also concerned although his labs are reassuring that he is heading towards failure to thrive without eating and drinking over the last few days and with 48 hours of near complete immobility due to the weakness.  Will give antibiotics and admit for further management.   Final Clinical Impression(s) / ED Diagnoses Final diagnoses:  Other fatigue  Recurrent pneumonia  Fall, initial encounter      Clinical Impression: 1. Other fatigue   2.  Fall   3. Recurrent pneumonia   4. Fall, initial encounter     Disposition: Admit  This note was prepared with assistance of Conservation officer, historic buildings. Occasional wrong-word or sound-a-like substitutions may have occurred due to the inherent limitations of voice recognition software.     Tearra Ouk, Canary Brim, MD 01/21/21 [phone]

## 2021-01-21 ENCOUNTER — Observation Stay (HOSPITAL_COMMUNITY): Payer: Medicare Other

## 2021-01-21 ENCOUNTER — Encounter (HOSPITAL_COMMUNITY): Payer: Self-pay | Admitting: Internal Medicine

## 2021-01-21 DIAGNOSIS — Z515 Encounter for palliative care: Secondary | ICD-10-CM | POA: Diagnosis not present

## 2021-01-21 DIAGNOSIS — Z87891 Personal history of nicotine dependence: Secondary | ICD-10-CM | POA: Diagnosis not present

## 2021-01-21 DIAGNOSIS — Z20822 Contact with and (suspected) exposure to covid-19: Secondary | ICD-10-CM | POA: Diagnosis present

## 2021-01-21 DIAGNOSIS — I10 Essential (primary) hypertension: Secondary | ICD-10-CM

## 2021-01-21 DIAGNOSIS — I4891 Unspecified atrial fibrillation: Secondary | ICD-10-CM | POA: Diagnosis present

## 2021-01-21 DIAGNOSIS — D649 Anemia, unspecified: Secondary | ICD-10-CM | POA: Diagnosis present

## 2021-01-21 DIAGNOSIS — Z96642 Presence of left artificial hip joint: Secondary | ICD-10-CM | POA: Diagnosis present

## 2021-01-21 DIAGNOSIS — Z79899 Other long term (current) drug therapy: Secondary | ICD-10-CM | POA: Diagnosis not present

## 2021-01-21 DIAGNOSIS — E119 Type 2 diabetes mellitus without complications: Secondary | ICD-10-CM | POA: Diagnosis present

## 2021-01-21 DIAGNOSIS — Z880 Allergy status to penicillin: Secondary | ICD-10-CM | POA: Diagnosis not present

## 2021-01-21 DIAGNOSIS — J13 Pneumonia due to Streptococcus pneumoniae: Secondary | ICD-10-CM | POA: Diagnosis present

## 2021-01-21 DIAGNOSIS — Z8249 Family history of ischemic heart disease and other diseases of the circulatory system: Secondary | ICD-10-CM | POA: Diagnosis not present

## 2021-01-21 DIAGNOSIS — J188 Other pneumonia, unspecified organism: Secondary | ICD-10-CM | POA: Diagnosis present

## 2021-01-21 DIAGNOSIS — Z7189 Other specified counseling: Secondary | ICD-10-CM | POA: Diagnosis not present

## 2021-01-21 DIAGNOSIS — Z6820 Body mass index (BMI) 20.0-20.9, adult: Secondary | ICD-10-CM | POA: Diagnosis not present

## 2021-01-21 DIAGNOSIS — I459 Conduction disorder, unspecified: Secondary | ICD-10-CM | POA: Diagnosis present

## 2021-01-21 DIAGNOSIS — E1129 Type 2 diabetes mellitus with other diabetic kidney complication: Secondary | ICD-10-CM

## 2021-01-21 DIAGNOSIS — Z950 Presence of cardiac pacemaker: Secondary | ICD-10-CM | POA: Diagnosis not present

## 2021-01-21 DIAGNOSIS — J189 Pneumonia, unspecified organism: Secondary | ICD-10-CM | POA: Diagnosis present

## 2021-01-21 DIAGNOSIS — L8990 Pressure ulcer of unspecified site, unspecified stage: Secondary | ICD-10-CM | POA: Insufficient documentation

## 2021-01-21 DIAGNOSIS — L89312 Pressure ulcer of right buttock, stage 2: Secondary | ICD-10-CM | POA: Diagnosis not present

## 2021-01-21 DIAGNOSIS — L89152 Pressure ulcer of sacral region, stage 2: Secondary | ICD-10-CM | POA: Diagnosis present

## 2021-01-21 DIAGNOSIS — I495 Sick sinus syndrome: Secondary | ICD-10-CM | POA: Diagnosis present

## 2021-01-21 DIAGNOSIS — Z7984 Long term (current) use of oral hypoglycemic drugs: Secondary | ICD-10-CM | POA: Diagnosis not present

## 2021-01-21 DIAGNOSIS — R627 Adult failure to thrive: Secondary | ICD-10-CM | POA: Diagnosis present

## 2021-01-21 DIAGNOSIS — W19XXXA Unspecified fall, initial encounter: Secondary | ICD-10-CM

## 2021-01-21 DIAGNOSIS — Z7982 Long term (current) use of aspirin: Secondary | ICD-10-CM | POA: Diagnosis not present

## 2021-01-21 DIAGNOSIS — F32A Depression, unspecified: Secondary | ICD-10-CM | POA: Diagnosis present

## 2021-01-21 DIAGNOSIS — E785 Hyperlipidemia, unspecified: Secondary | ICD-10-CM | POA: Diagnosis present

## 2021-01-21 LAB — COMPREHENSIVE METABOLIC PANEL
ALT: 9 U/L (ref 0–44)
AST: 13 U/L — ABNORMAL LOW (ref 15–41)
Albumin: 2.2 g/dL — ABNORMAL LOW (ref 3.5–5.0)
Alkaline Phosphatase: 66 U/L (ref 38–126)
Anion gap: 8 (ref 5–15)
BUN: 32 mg/dL — ABNORMAL HIGH (ref 8–23)
CO2: 26 mmol/L (ref 22–32)
Calcium: 7.7 mg/dL — ABNORMAL LOW (ref 8.9–10.3)
Chloride: 104 mmol/L (ref 98–111)
Creatinine, Ser: 0.8 mg/dL (ref 0.61–1.24)
GFR, Estimated: >60 mL/min (ref >60)
Glucose, Bld: 122 mg/dL — ABNORMAL HIGH (ref 70–99)
Potassium: 3.2 mmol/L — ABNORMAL LOW (ref 3.5–5.1)
Sodium: 138 mmol/L (ref 135–145)
Total Bilirubin: 0.8 mg/dL (ref 0.3–1.2)
Total Protein: 5.1 g/dL — ABNORMAL LOW (ref 6.5–8.1)

## 2021-01-21 LAB — MAGNESIUM: Magnesium: 1.8 mg/dL (ref 1.7–2.4)

## 2021-01-21 LAB — CBC WITH DIFFERENTIAL/PLATELET
Abs Immature Granulocytes: 0.06 10*3/uL (ref 0.00–0.07)
Basophils Absolute: 0 10*3/uL (ref 0.0–0.1)
Basophils Relative: 0 %
Eosinophils Absolute: 0 10*3/uL (ref 0.0–0.5)
Eosinophils Relative: 0 %
HCT: 34.2 % — ABNORMAL LOW (ref 39.0–52.0)
Hemoglobin: 11.6 g/dL — ABNORMAL LOW (ref 13.0–17.0)
Immature Granulocytes: 1 %
Lymphocytes Relative: 32 %
Lymphs Abs: 2.3 10*3/uL (ref 0.7–4.0)
MCH: 30.1 pg (ref 26.0–34.0)
MCHC: 33.9 g/dL (ref 30.0–36.0)
MCV: 88.6 fL (ref 80.0–100.0)
Monocytes Absolute: 0.6 10*3/uL (ref 0.1–1.0)
Monocytes Relative: 9 %
Neutro Abs: 4 10*3/uL (ref 1.7–7.7)
Neutrophils Relative %: 58 %
Platelets: 231 10*3/uL (ref 150–400)
RBC: 3.86 MIL/uL — ABNORMAL LOW (ref 4.22–5.81)
RDW: 17.2 % — ABNORMAL HIGH (ref 11.5–15.5)
WBC: 7 10*3/uL (ref 4.0–10.5)
nRBC: 0 % (ref 0.0–0.2)

## 2021-01-21 LAB — GLUCOSE, CAPILLARY
Glucose-Capillary: 124 mg/dL — ABNORMAL HIGH (ref 70–99)
Glucose-Capillary: 90 mg/dL (ref 70–99)
Glucose-Capillary: 117 mg/dL — ABNORMAL HIGH (ref 70–99)
Glucose-Capillary: 271 mg/dL — ABNORMAL HIGH (ref 70–99)

## 2021-01-21 LAB — STREP PNEUMONIAE URINARY ANTIGEN: Strep Pneumo Urinary Antigen: POSITIVE — AB

## 2021-01-21 LAB — PROCALCITONIN: Procalcitonin: <0.1 ng/mL

## 2021-01-21 MED ORDER — POTASSIUM CHLORIDE CRYS ER 10 MEQ PO TBCR
40.0000 meq | EXTENDED_RELEASE_TABLET | Freq: Once | ORAL | Status: AC
  Administered 2021-01-21: 40 meq via ORAL
  Filled 2021-01-21: qty 4

## 2021-01-21 MED ORDER — SODIUM CHLORIDE 0.9 % IV SOLN: INTRAVENOUS | Status: DC

## 2021-01-21 MED ORDER — ACETAMINOPHEN 325 MG PO TABS: 650.0000 mg | ORAL_TABLET | Freq: Four times a day (QID) | ORAL | Status: DC | PRN

## 2021-01-21 MED ORDER — SODIUM CHLORIDE 0.9 % IV SOLN
500.0000 mg | INTRAVENOUS | Status: DC
  Administered 2021-01-21 – 2021-01-23 (×2): 500 mg via INTRAVENOUS
  Filled 2021-01-21 (×2): qty 500

## 2021-01-21 MED ORDER — ASPIRIN 81 MG PO TBEC: 81.0000 mg | DELAYED_RELEASE_TABLET | Freq: Every day | ORAL | Status: DC

## 2021-01-21 MED ORDER — ASPIRIN EC 81 MG PO TBEC
81.0000 mg | DELAYED_RELEASE_TABLET | Freq: Every day | ORAL | Status: DC
  Administered 2021-01-21 – 2021-01-25 (×5): 81 mg via ORAL
  Filled 2021-01-21 (×6): qty 1

## 2021-01-21 MED ORDER — INSULIN ASPART 100 UNIT/ML IJ SOLN
0.0000 [IU] | Freq: Three times a day (TID) | INTRAMUSCULAR | Status: DC
  Administered 2021-01-21: 1 [IU] via SUBCUTANEOUS
  Administered 2021-01-22 – 2021-01-24 (×3): 2 [IU] via SUBCUTANEOUS

## 2021-01-21 MED ORDER — CARVEDILOL 6.25 MG PO TABS
6.2500 mg | ORAL_TABLET | Freq: Two times a day (BID) | ORAL | Status: DC
  Administered 2021-01-21 – 2021-01-25 (×9): 6.25 mg via ORAL
  Filled 2021-01-21 (×9): qty 1

## 2021-01-21 MED ORDER — TAMSULOSIN HCL 0.4 MG PO CAPS
0.4000 mg | ORAL_CAPSULE | Freq: Every day | ORAL | Status: DC
  Administered 2021-01-21 – 2021-01-25 (×5): 0.4 mg via ORAL
  Filled 2021-01-21 (×5): qty 1

## 2021-01-21 MED ORDER — ENOXAPARIN SODIUM 40 MG/0.4ML IJ SOSY
40.0000 mg | PREFILLED_SYRINGE | INTRAMUSCULAR | Status: DC
  Administered 2021-01-21 – 2021-01-25 (×5): 40 mg via SUBCUTANEOUS
  Filled 2021-01-21 (×5): qty 0.4

## 2021-01-21 MED ORDER — ACETAMINOPHEN 650 MG RE SUPP: 650.0000 mg | Freq: Four times a day (QID) | RECTAL | Status: DC | PRN

## 2021-01-21 MED ORDER — MIRTAZAPINE 15 MG PO TABS
7.5000 mg | ORAL_TABLET | Freq: Every day | ORAL | Status: DC
  Administered 2021-01-21 – 2021-01-24 (×4): 7.5 mg via ORAL
  Filled 2021-01-21 (×4): qty 1

## 2021-01-21 MED ORDER — SODIUM CHLORIDE 0.9 % IV SOLN
2.0000 g | INTRAVENOUS | Status: DC
  Administered 2021-01-21 – 2021-01-23 (×3): 2 g via INTRAVENOUS
  Filled 2021-01-21 (×4): qty 20

## 2021-01-21 NOTE — ED Notes (Signed)
[phone]-- Nathan Alvarez- would like to be contacted with room number.

## 2021-01-21 NOTE — Evaluation (Signed)
Clinical/Bedside Swallow Evaluation Patient Details  Name: Nathan Alvarez MRN: [id number] Date of Birth: 09/02/1924  Today's Date: 01/21/2021 Time: SLP Start Time (ACUTE ONLY): 1030 SLP Stop Time (ACUTE ONLY): 1045 SLP Time Calculation (min) (ACUTE ONLY): 15 min  Past Medical History:  Past Medical History:  Diagnosis Date   A-fib (HCC)    Dizziness    DM (diabetes mellitus) (HCC)    Esophagitis, reflux    Heart block    Hyperlipidemia    Hypertension, essential, benign    Memory deficit    Pathological fracture of left hip due to age-related osteoporosis (HCC) 04/25/2019   SOB (shortness of breath)    SSS (sick sinus syndrome) (HCC)    Past Surgical History:  Past Surgical History:  Procedure Laterality Date   CATARACT EXTRACTION Right    HIP ARTHROPLASTY Left 04/22/2019   Procedure: ARTHROPLASTY BIPOLAR HIP (HEMIARTHROPLASTY);  Surgeon: Myrene Galas, MD;  Location: [facility] OR;  Service: Orthopedics;  Laterality: Left;   INGUINAL HERNIA REPAIR     INSERT / REPLACE / REMOVE PACEMAKER     PACEMAKER INSERTION     TONSILLECTOMY AND ADENOIDECTOMY     HPI:  Patient is a 87 y.o. male with PMH: sinus node dysfunction status post pacemaker placement, hypertension diabetes. He presented to ER with rapid decline in energy, decreased mobility, not eating and drinking, diarrhea, fatigue, and fall day of admission. CXR showed possibility of PNA and patient appearing generally weak. COVID test negative. Patient started on antibiotics. He was admitted last month for enterocolitis and renal failure with CXR during that admission concerning for possible aspiration PNA. BSE was completed during previous admission and although he was not showing any overt s/s aspiration or penetration, concern for patient's ability to masticate and orally manipulate solids led to SLP recommending downgrade of solid textures to Dys 2 (fine chop).    Assessment / Plan / Recommendation  Clinical Impression  Patient  presents with clinical s/s of dysphagia during oral phase with SLP observing prolonged mastication of regular texture solids and oral residuals post initial swallows (primarily on upper denture plate). No overt s/s aspiration or penetration observed with thin liquids, even with successive straw sips. SLP plans to observe patient again with meal if possible as he currently is declining much of solid foods, in order to determine if needs to have solid textures downgraded to something more soft. SLP Visit Diagnosis: Dysphagia, unspecified (R13.10)    Aspiration Risk  Mild aspiration risk    Diet Recommendation Regular;Thin liquid   Liquid Administration via: Cup;Straw Medication Administration: Whole meds with liquid Supervision: Patient able to self feed Compensations: Minimize environmental distractions;Slow rate;Small sips/bites;Follow solids with liquid Postural Changes: Seated upright at 90 degrees    Other  Recommendations Oral Care Recommendations: Oral care BID;Staff/trained caregiver to provide oral care    Recommendations for follow up therapy are one component of a multi-disciplinary discharge planning process, led by the attending physician.  Recommendations may be updated based on patient status, additional functional criteria and insurance authorization.  Follow up Recommendations Other (comment) (TBD pending progress)      Frequency and Duration min 1 x/week  1 week       Prognosis Prognosis for Safe Diet Advancement: Good      Swallow Study   General Date of Onset: 01/21/21 HPI: Patient is a 87 y.o. male with PMH: sinus node dysfunction status post pacemaker placement, hypertension diabetes. He presented to ER with rapid decline in energy, decreased mobility, not  eating and drinking, diarrhea, fatigue, and fall day of admission. CXR showed possibility of PNA and patient appearing generally weak. COVID test negative. Patient started on antibiotics. He was admitted last  month for enterocolitis and renal failure with CXR during that admission concerning for possible aspiration PNA. BSE was completed during previous admission and although he was not showing any overt s/s aspiration or penetration, concern for patient's ability to masticate and orally manipulate solids led to SLP recommending downgrade of solid textures to Dys 2 (fine chop). Type of Study: Bedside Swallow Evaluation Previous Swallow Assessment: During previous admission last month (BSE, recommending Dys 2 solids due to oral phase dysphagia) Diet Prior to this Study: Regular;Thin liquids Temperature Spikes Noted: No Respiratory Status: Room air History of Recent Intubation: No Behavior/Cognition: Alert;Cooperative;Pleasant mood Oral Cavity Assessment: Within Functional Limits Oral Care Completed by SLP: Yes Oral Cavity - Dentition: Dentures, top;Adequate natural dentition Vision: Functional for self-feeding Self-Feeding Abilities: Able to feed self Patient Positioning: Upright in bed Baseline Vocal Quality: Normal Volitional Swallow: Able to elicit    Oral/Motor/Sensory Function Overall Oral Motor/Sensory Function: Mild impairment Facial ROM: Within Functional Limits Facial Symmetry: Within Functional Limits Facial Strength: Within Functional Limits Facial Sensation: Within Functional Limits Lingual ROM: Within Functional Limits Lingual Symmetry: Within Functional Limits Lingual Strength: Reduced Velum: Within Functional Limits   Ice Chips     Thin Liquid Thin Liquid: Within functional limits Presentation: Straw;Self Fed    Nectar Thick     Honey Thick     Puree Puree: Within functional limits Presentation: Spoon   Solid     Solid: Impaired Oral Phase Impairments: Impaired mastication;Reduced lingual movement/coordination Oral Phase Functional Implications: Oral residue;Impaired mastication;Prolonged oral transit Other Comments: after eating cracker, patient with PO residuals on  upper denture plate. This cleared when patient given bite of applesauce.      Angela Nevin, MA, CCC-SLP Speech Therapy

## 2021-01-21 NOTE — Progress Notes (Signed)
Speech Language Pathology Treatment: Dysphagia  Patient Details Name: Nathan Alvarez MRN: [id number] DOB: 1924-05-09 Today's Date: 01/21/2021 Time: 0388-8280 SLP Time Calculation (min) (ACUTE ONLY): 20 min  Assessment / Plan / Recommendation Clinical Impression  Patient seen with son present in room for dysphagia tx session with SLP. SLP spoke with patient's son while patient fed himself lunch meal. Son reported that patient has eaten more than he has in a long time just now (having meatloaf, mac and cheese) and he seems to be doing better with softer items. (Patient not able to masticate brocolli) Patient exhibits prolonged mastication, bolus formation and oral transit delays, however no overt s/s aspiration or penetration and oral residuals clearing with subsequent swallows and sips of liquids. Son in agreement with downgrading solids slightly from regular texture to Dys 3 texture. Patient's son shared that prior to this admission, patient had become weaker, was not getting out of bed until around 12pm, was not eating or drinking much. He also stated that patient is usually very mentally sharp but he has been more confused lately.   HPI HPI: Patient is a 87 y.o. male with PMH: sinus node dysfunction status post pacemaker placement, hypertension diabetes. He presented to ER with rapid decline in energy, decreased mobility, not eating and drinking, diarrhea, fatigue, and fall day of admission. CXR showed possibility of PNA and patient appearing generally weak. COVID test negative. Patient started on antibiotics. He was admitted last month for enterocolitis and renal failure with CXR during that admission concerning for possible aspiration PNA. BSE was completed during previous admission and although he was not showing any overt s/s aspiration or penetration, concern for patient's ability to masticate and orally manipulate solids led to SLP recommending downgrade of solid textures to Dys 2 (fine chop).       SLP Plan  Continue with current plan of care      Recommendations for follow up therapy are one component of a multi-disciplinary discharge planning process, led by the attending physician.  Recommendations may be updated based on patient status, additional functional criteria and insurance authorization.    Recommendations  Diet recommendations: Dysphagia 3 (mechanical soft);Thin liquid Liquids provided via: Cup;Straw Medication Administration: Whole meds with liquid Supervision: Patient able to self feed;Intermittent supervision to cue for compensatory strategies Compensations: Minimize environmental distractions;Slow rate;Small sips/bites;Follow solids with liquid Postural Changes and/or Swallow Maneuvers: Seated upright 90 degrees                Oral Care Recommendations: Oral care BID;Staff/trained caregiver to provide oral care Follow up Recommendations: Skilled Nursing facility;24 hour supervision/assistance SLP Visit Diagnosis: Dysphagia, unspecified (R13.10) Plan: Continue with current plan of care       Sonia Baller, MA, CCC-SLP Speech Therapy

## 2021-01-21 NOTE — Plan of Care (Signed)
[facility] will assume care on arrival to accepting facility. Until arrival, care as per EDP. However, [facility] available 24/7 for questions and assistance.  Nursing staff please page [facility] Admits and Consults ([phone]) as soon as the patient arrives the hospital.   

## 2021-01-21 NOTE — TOC Initial Note (Signed)
Transition of Care [facility]) - Initial/Assessment Note    Patient Details  Name: Nathan Alvarez MRN: [id number] Date of Birth: 1924-10-04  Transition of Care [facility]) CM/SW Contact:    Lanier Clam, RN Phone Number: 01/21/2021, 4:00 PM  Clinical Narrative: Sherron Monday to dtr Hilda Lias about d/c plans-From home alone, Active w/[facility] HHPT;await PT eval & recc.                    Barriers to Discharge: Continued Medical Work up   Patient Goals and CMS Choice Patient states their goals for this hospitalization and ongoing recovery are:: Per dtr-per reccommendation CMS Medicare.gov Compare Post Acute Care list provided to:: Patient Represenative (must comment) (Marie-dtr [phone]) Choice offered to / list presented to : Adult Children  Expected Discharge Plan and Services     Discharge Planning Services: CM Consult Post Acute Care Choice:  (TBD) Living arrangements for the past 2 months: Single Family Home                                      Prior Living Arrangements/Services Living arrangements for the past 2 months: Single Family Home Lives with:: Self Patient language and need for interpreter reviewed:: Yes Do you feel safe going back to the place where you live?: Yes      Need for Family Participation in Patient Care: No (Comment) Care giver support system in place?: Yes (comment) Current home services: DME, Home PT (rw,toilet riser,3n1) Criminal Activity/Legal Involvement Pertinent to Current Situation/Hospitalization: No - Comment as needed  Activities of Daily Living Home Assistive Devices/Equipment: Eyeglasses, Environmental consultant (specify type) ADL Screening (condition at time of admission) Patient's cognitive ability adequate to safely complete daily activities?: No Is the patient deaf or have difficulty hearing?: Yes Does the patient have difficulty seeing, even when wearing glasses/contacts?: No Does the patient have difficulty concentrating, remembering, or making decisions?:  No Patient able to express need for assistance with ADLs?: Yes Does the patient have difficulty dressing or bathing?: Yes Independently performs ADLs?: No Communication: Independent Dressing (OT): Independent with device (comment) Grooming: Independent with device (comment) Feeding: Independent Bathing: Needs assistance Toileting: Independent In/Out Bed: Independent Walks in Home: Independent with device (comment) Does the patient have difficulty walking or climbing stairs?: Yes Weakness of Legs: Both Weakness of Arms/Hands: Both  Permission Sought/Granted Permission sought to share information with : Case Manager Permission granted to share information with : Yes, Verbal Permission Granted  Share Information with NAME: Case Manager     Permission granted to share info w Relationship: Hilda Lias dtr [phone]     Emotional Assessment Appearance:: Appears stated age Attitude/Demeanor/Rapport: Gracious Affect (typically observed): Accepting Orientation: : Oriented to Self, Oriented to Place, Oriented to  Time Alcohol / Substance Use: Not Applicable Psych Involvement: No (comment)  Admission diagnosis:  Fall [W19.XXXA] CAP (community acquired pneumonia) [J18.9] Recurrent pneumonia [J18.9] Fall, initial encounter [W19.XXXA] Other fatigue [R53.83] Patient Active Problem List   Diagnosis Date Noted   CAP (community acquired pneumonia) 01/21/2021   Pressure injury of skin 01/21/2021   Fall    Aspiration pneumonia (HCC) 12/15/2020   Sepsis due to aspiration pneumonia and enterocolitis 12/15/2020   AKI (acute kidney injury) (HCC) 12/14/2020   Enterocolitis-C diff test is Equivocal  12/14/2020   Sinus node dysfunction (HCC) 03/06/2020   Pacemaker 03/06/2020   Vitamin D deficiency 04/25/2019   Pathological fracture of left  hip due to age-related osteoporosis (HCC) 04/25/2019   Femoral neck fracture (HCC) 04/21/2019   Hypertension 04/21/2019   Diabetes (HCC) 04/21/2019    Hyponatremia 04/21/2019   Anemia 04/21/2019   PCP:  Vivien Presto, MD Pharmacy:   [facility] [phone] - [place], [place] - [address] [place] [address] Phone: [phone] Fax: [phone]     Social Determinants of Health (SDOH) Interventions    Readmission Risk Interventions No flowsheet data found.

## 2021-01-21 NOTE — Progress Notes (Signed)
I have seen and assessed patient and agree with Dr. Katherene Ponto assessment and plan.  Patient is a pleasant 87 year old gentleman history of sinus node dysfunction status post PPM, hypertension, diabetes admitted last month for enterocolitis and renal failure sustained a fall at home when he tried to get up from the bed and slid on the floor denied LOC or hitting his head.  Patient called his daughter who brought him to the ED found to have generalized weakness over the past few days.  In the ED chest x-ray done concerning for pneumonia as well as patient noted to have generalized weakness.  COVID-19 PCR negative.  Potassium noted at 3.4.  Urine strep pneumococcus antigen positive.  Urine Legionella antigen pending.  Continue empiric IV antibiotics pending urine Legionella antigen results.  IV fluids.  PT/OT.  Likely needs placement.  No charge.Marland Kitchen

## 2021-01-21 NOTE — Evaluation (Addendum)
Physical Therapy Evaluation Patient Details Name: Nathan Alvarez MRN: [id number] DOB: Mar 14, 1925 Today's Date: 01/21/2021  History of Present Illness  87 yo male admitted with Pna, fall, weakness. Hx of Afib, dizziness, DM, HB, SSS-pacemaker, asp pna, L hp fx, hearing loss  Clinical Impression  On eval, pt required Min A for mobility. He walked ~60 feet with a RW. Pt presents with general weakness, decreased activity tolerance, and impaired gait and balance. Discussed d/c plan with pt and family(son)-plan is for ST rehab.        Recommendations for follow up therapy are one component of a multi-disciplinary discharge planning process, led by the attending physician.  Recommendations may be updated based on patient status, additional functional criteria and insurance authorization.  Follow Up Recommendations SNF    Equipment Recommendations  None recommended by PT    Recommendations for Other Services       Precautions / Restrictions Precautions Precautions: Fall Restrictions Weight Bearing Restrictions: No      Mobility  Bed Mobility Overal bed mobility: Needs Assistance Bed Mobility: Supine to Sit     Supine to sit: HOB elevated;Min guard     General bed mobility comments: Increased time. Cues provided.    Transfers Overall transfer level: Needs assistance Equipment used: Rolling walker (2 wheeled) Transfers: Sit to/from Stand Sit to Stand: Min assist;From elevated surface         General transfer comment: Assist to rise, steady, control descent. Cues for safety, technique, hand placement  Ambulation/Gait Ambulation/Gait assistance: Min assist Gait Distance (Feet): 60 Feet Assistive device: Rolling walker (2 wheeled) Gait Pattern/deviations: Step-through pattern;Decreased stride length     General Gait Details: Cues for safety. Assist to stabilize pt throughout distance.  Stairs            Wheelchair Mobility    Modified Rankin (Stroke Patients  Only)       Balance Overall balance assessment: Needs assistance         Standing balance support: Bilateral upper extremity supported Standing balance-Leahy Scale: Poor                               Pertinent Vitals/Pain Pain Assessment: No/denies pain    Home Living Family/patient expects to be discharged to:: Skilled nursing facility Living Arrangements: Alone Available Help at Discharge: Family;Available PRN/intermittently Type of Home: House Home Access: Stairs to enter Entrance Stairs-Rails: Right Entrance Stairs-Number of Steps: 2 Home Layout: One level Home Equipment: Walker - 2 wheels;Shower seat;Grab bars - tub/shower      Prior Function Level of Independence: Independent with assistive device(s)         Comments: uses rollator for ambulation up until     Hand Dominance        Extremity/Trunk Assessment   Upper Extremity Assessment Upper Extremity Assessment: Generalized weakness    Lower Extremity Assessment Lower Extremity Assessment: Generalized weakness    Cervical / Trunk Assessment Cervical / Trunk Assessment: Kyphotic  Communication   Communication: HOH  Cognition Arousal/Alertness: Awake/alert Behavior During Therapy: WFL for tasks assessed/performed Overall Cognitive Status: History of cognitive impairments - at baseline                                 General Comments: hx of dementia      General Comments      Exercises     Assessment/Plan  PT Assessment Patient needs continued PT services  PT Problem List Decreased strength;Decreased mobility;Decreased activity tolerance;Decreased balance;Decreased knowledge of use of DME       PT Treatment Interventions DME instruction;Gait training;Therapeutic exercise;Balance training;Functional mobility training;Therapeutic activities;Patient/family education    PT Goals (Current goals can be found in the Care Plan section)  Acute Rehab PT  Goals Patient Stated Goal: to get stronger before transitioning to ALF PT Goal Formulation: With patient Time For Goal Achievement: 02/04/21 Potential to Achieve Goals: Good    Frequency Min 2X/week   Barriers to discharge        Co-evaluation               AM-PAC PT "6 Clicks" Mobility  Outcome Measure Help needed turning from your back to your side while in a flat bed without using bedrails?: A Little Help needed moving from lying on your back to sitting on the side of a flat bed without using bedrails?: A Little Help needed moving to and from a bed to a chair (including a wheelchair)?: A Little Help needed standing up from a chair using your arms (e.g., wheelchair or bedside chair)?: A Little Help needed to walk in hospital room?: A Little Help needed climbing 3-5 steps with a railing? : A Little 6 Click Score: 18    End of Session Equipment Utilized During Treatment: Gait belt Activity Tolerance: Patient tolerated treatment well Patient left: in chair;with call bell/phone within reach;with chair alarm set   PT Visit Diagnosis: Muscle weakness (generalized) (M62.81);Difficulty in walking, not elsewhere classified (R26.2)    Time: 0037-0488 PT Time Calculation (min) (ACUTE ONLY): 17 min   Charges:   PT Evaluation $PT Eval Moderate Complexity: 1 Mod             Faye Ramsay, PT Acute Rehabilitation  Office: [phone] Pager: [phone]

## 2021-01-21 NOTE — H&P (Signed)
History and Physical    Nathan Alvarez FIE:[id number] DOB: [birth year] DOA: 01/20/2021  PCP: Vivien Presto, MD  Patient coming from: Home.  Chief Complaint: Fall.  HPI: Nathan Alvarez is a 87 y.o. male with history of sinus node dysfunction status post pacemaker placement, hypertension diabetes admitted last month for enterocolitis and renal failure and a fall at home when he tried to get up from the bed and slid off onto the floor denies hitting head or losing consciousness.  He called his daughter who brought him to the ER since patient was found to be weak last few days.  Denies chest pain or shortness of breath or productive cough.  ED Course: In the ER patient's x-ray shows possibility of pneumonia.  Patient also appears generally weak.  COVID test was negative hemoglobin 10.6 potassium 3.4 patient was started on antibiotics for community-acquired pneumonia.  EKG shows sinus rhythm.  Review of Systems: As per HPI, rest all negative.   Past Medical History:  Diagnosis Date   A-fib (HCC)    Dizziness    DM (diabetes mellitus) (HCC)    Esophagitis, reflux    Heart block    Hyperlipidemia    Hypertension, essential, benign    Memory deficit    Pathological fracture of left hip due to age-related osteoporosis (HCC) 04/25/2019   SOB (shortness of breath)    SSS (sick sinus syndrome) (HCC)     Past Surgical History:  Procedure Laterality Date   CATARACT EXTRACTION Right    HIP ARTHROPLASTY Left 04/22/2019   Procedure: ARTHROPLASTY BIPOLAR HIP (HEMIARTHROPLASTY);  Surgeon: Myrene Galas, MD;  Location: [facility] OR;  Service: Orthopedics;  Laterality: Left;   INGUINAL HERNIA REPAIR     INSERT / REPLACE / REMOVE PACEMAKER     PACEMAKER INSERTION     TONSILLECTOMY AND ADENOIDECTOMY       reports that he quit smoking about 65 years ago. His smoking use included cigarettes. He has never been exposed to tobacco smoke. He has never used smokeless tobacco. He reports that he does not  drink alcohol and does not use drugs.  Allergies  Allergen Reactions   Penicillins     unknown    Family History  Problem Relation Age of Onset   Heart disease Mother     Prior to Admission medications   Medication Sig Start Date End Date Taking? Authorizing Provider  acetaminophen (TYLENOL) 325 MG tablet Take 2 tablets (650 mg total) by mouth every 6 (six) hours as needed for mild pain (or Fever >/= 101). 12/16/20   Shon Hale, MD  aspirin EC 81 MG EC tablet Take 1 tablet (81 mg total) by mouth daily with breakfast. Swallow whole. 12/16/20   Shon Hale, MD  calcium carbonate (OS-CAL - DOSED IN MG OF ELEMENTAL CALCIUM) 1250 (500 Ca) MG tablet Take 1 tablet (500 mg of elemental calcium total) by mouth 2 (two) times daily with a meal. 12/16/20   Emokpae, Courage, MD  carvedilol (COREG) 6.25 MG tablet Take 1 tablet (6.25 mg total) by mouth 2 (two) times daily. For BP and Heart 12/16/20   Shon Hale, MD  Cholecalciferol (VITAMIN D) 125 MCG (5000 UT) CAPS Take 1 capsule by mouth daily. 12/16/20   Shon Hale, MD  diclofenac Sodium (VOLTAREN) 1 % GEL Apply 4 g topically every 8 (eight) hours as needed (pain). 09/17/20   [provider]  Ferrous Sulfate (IRON) 325 (65 Fe) MG TABS Take 28 mg by mouth daily.  [provider]  JANUVIA 50 MG tablet Take 1 tablet (50 mg total) by mouth daily. 12/16/20   Shon Hale, MD  mirtazapine (REMERON) 7.5 MG tablet Take 1 tablet (7.5 mg total) by mouth at bedtime. For appetite stimulation and mood 12/16/20   Shon Hale, MD  tamsulosin (FLOMAX) 0.4 MG CAPS capsule Take 1 capsule (0.4 mg total) by mouth daily. 12/16/20   Shon Hale, MD    Physical Exam: Constitutional: Moderately built and nourished. Vitals:   01/20/21 2230 01/21/21 0225 01/21/21 0242 01/21/21 0542  BP: (!) 145/79 (!) 164/92 (!) 164/92 (!) 138/92  Pulse: 74 71 71 64  Resp: (!) 24 (!) 23 (!) 23 (!) 24  Temp:  97.6 F (36.4 C)  97.8 F  (36.6 C)  TempSrc:   Oral   SpO2: 95% 97%  92%  Weight:   58.3 kg   Height:   5\' 7"  (1.702 m)    Eyes: Anicteric no pallor. ENMT: No discharge from the ears eyes nose and mouth. Neck: No mass felt.  No neck rigidity. Respiratory: No rhonchi or crepitations. Cardiovascular: S1-S2 heard. Abdomen: Soft nontender bowel sound present. Musculoskeletal: No edema. Skin: No rash. Neurologic: Alert awake oriented time place and person.  Moves all extremities. Psychiatric: Appears normal.  Normal affect.   Labs on Admission: I have personally reviewed following labs and imaging studies  CBC: Recent Labs  Lab 01/20/21 2123  WBC 7.7  NEUTROABS 3.8  HGB 10.6*  HCT 31.8*  MCV 86.4  PLT 266   Basic Metabolic Panel: Recent Labs  Lab 01/20/21 2123  NA 136  K 3.4*  CL 103  CO2 24  GLUCOSE 161*  BUN 36*  CREATININE 0.81  CALCIUM 7.8*   GFR: Estimated Creatinine Clearance: 47 mL/min (by C-G formula based on SCr of 0.81 mg/dL). Liver Function Tests: Recent Labs  Lab 01/20/21 2123  AST 11*  ALT 5  ALKPHOS 66  BILITOT 0.6  PROT 5.2*  ALBUMIN 2.6*   No results for input(s): LIPASE, AMYLASE in the last 168 hours. Recent Labs  Lab 01/20/21 2227  AMMONIA 26   Coagulation Profile: No results for input(s): INR, PROTIME in the last 168 hours. Cardiac Enzymes: Recent Labs  Lab 01/20/21 2123  CKTOTAL 27*   BNP (last 3 results) No results for input(s): PROBNP in the last 8760 hours. HbA1C: No results for input(s): HGBA1C in the last 72 hours. CBG: No results for input(s): GLUCAP in the last 168 hours. Lipid Profile: No results for input(s): CHOL, HDL, LDLCALC, TRIG, CHOLHDL, LDLDIRECT in the last 72 hours. Thyroid Function Tests: No results for input(s): TSH, T4TOTAL, FREET4, T3FREE, THYROIDAB in the last 72 hours. Anemia Panel: No results for input(s): VITAMINB12, FOLATE, FERRITIN, TIBC, IRON, RETICCTPCT in the last 72 hours. Urine analysis:    Component Value  Date/Time   COLORURINE YELLOW 01/20/2021 2225   APPEARANCEUR CLEAR 01/20/2021 2225   LABSPEC 1.026 01/20/2021 2225   PHURINE 6.0 01/20/2021 2225   GLUCOSEU NEGATIVE 01/20/2021 2225   HGBUR NEGATIVE 01/20/2021 2225   BILIRUBINUR NEGATIVE 01/20/2021 2225   KETONESUR NEGATIVE 01/20/2021 2225   PROTEINUR 30 (A) 01/20/2021 2225   NITRITE NEGATIVE 01/20/2021 2225   LEUKOCYTESUR NEGATIVE 01/20/2021 2225   Sepsis Labs: @LABRCNTIP (procalcitonin:4,lacticidven:4) ) Recent Results (from the past 240 hour(s))  Resp Panel by RT-PCR (Flu A&B, Covid) Urine, In & Out Cath     Status: None   Collection Time: 01/20/21 10:27 PM   Specimen: Urine, In & Out  Cath; Nasopharyngeal(NP) swabs in vial transport medium  Result Value Ref Range Status   SARS Coronavirus 2 by RT PCR NEGATIVE NEGATIVE Final    Comment: (NOTE) SARS-CoV-2 target nucleic acids are NOT DETECTED.  The SARS-CoV-2 RNA is generally detectable in upper respiratory specimens during the acute phase of infection. The lowest concentration of SARS-CoV-2 viral copies this assay can detect is 138 copies/mL. A negative result does not preclude SARS-Cov-2 infection and should not be used as the sole basis for treatment or other patient management decisions. A negative result may occur with  improper specimen collection/handling, submission of specimen other than nasopharyngeal swab, presence of viral mutation(s) within the areas targeted by this assay, and inadequate number of viral copies(<138 copies/mL). A negative result must be combined with clinical observations, patient history, and epidemiological information. The expected result is Negative.  Fact Sheet for Patients:  BloggerCourse.com  Fact Sheet for Healthcare Providers:  SeriousBroker.it  This test is no t yet approved or cleared by the [place] FDA and  has been authorized for detection and/or diagnosis of SARS-CoV-2  by FDA under an Emergency Use Authorization (EUA). This EUA will remain  in effect (meaning this test can be used) for the duration of the COVID-19 declaration under Section 564(b)(1) of the Act, 21 U.S.C.section 360bbb-3(b)(1), unless the authorization is terminated  or revoked sooner.       Influenza A by PCR NEGATIVE NEGATIVE Final   Influenza B by PCR NEGATIVE NEGATIVE Final    Comment: (NOTE) The Xpert Xpress SARS-CoV-2/FLU/RSV plus assay is intended as an aid in the diagnosis of influenza from Nasopharyngeal swab specimens and should not be used as a sole basis for treatment. Nasal washings and aspirates are unacceptable for Xpert Xpress SARS-CoV-2/FLU/RSV testing.  Fact Sheet for Patients: BloggerCourse.com  Fact Sheet for Healthcare Providers: SeriousBroker.it  This test is not yet approved or cleared by the [place] FDA and has been authorized for detection and/or diagnosis of SARS-CoV-2 by FDA under an Emergency Use Authorization (EUA). This EUA will remain in effect (meaning this test can be used) for the duration of the COVID-19 declaration under Section 564(b)(1) of the Act, 21 U.S.C. section 360bbb-3(b)(1), unless the authorization is terminated or revoked.  Performed at [facility], [address], [place], [place] [address]      Radiological Exams on Admission: DG Ribs Unilateral W/Chest Left  Result Date: 01/20/2021 CLINICAL DATA:  Fall EXAM: LEFT RIBS AND CHEST - 3+ VIEW COMPARISON:  12/15/2020 FINDINGS: Cardiomegaly. Left pacer remains in place, unchanged. Left lower lobe atelectasis or infiltrate. No visible displaced rib fracture. No effusion or pneumothorax. IMPRESSION: No visible displaced rib fracture. Left lower lobe atelectasis or infiltrate. Electronically Signed   By: Charlett Nose M.D.   On: 01/20/2021 22:17   DG CHEST PORT 1 VIEW  Result Date: 01/21/2021 CLINICAL DATA:   Dyspnea EXAM: PORTABLE CHEST 1 VIEW COMPARISON:  12/15/2020 FINDINGS: Dual-chamber pacer leads from the left. Chronic cardiomegaly. Stable hazy density at the left base where there is pleural fluid. Lucency at the peripheral left chest best attributed to skin folds which were also seen elsewhere. IMPRESSION: Stable from yesterday.  Left lower lobe atelectasis or infiltrate. Electronically Signed   By: Tiburcio Pea M.D.   On: 01/21/2021 06:12   DG Hip Unilat W or Wo Pelvis 2-3 Views Left  Result Date: 01/20/2021 CLINICAL DATA:  Fall EXAM: DG HIP (WITH OR WITHOUT PELVIS) 2-3V LEFT COMPARISON:  04/22/2019 FINDINGS: No acute fracture,  subluxation or dislocation. Prior left hip replacement. No hardware complicating feature. IMPRESSION: No acute bony abnormality. Electronically Signed   By: Charlett Nose M.D.   On: 01/20/2021 22:18    EKG: Independently reviewed.  Normal sinus rhythm.  Assessment/Plan Principal Problem:   CAP (community acquired pneumonia) Active Problems:   Hypertension   Diabetes (HCC)   Sinus node dysfunction (HCC)   Pacemaker   Fall    Possible pneumonia for which patient was empirically placed on antibiotics.  Check procalcitonin levels. Generalized weakness could be from deconditioning and was also recently treated for diarrhea.  We will get physical therapy consult and further recommendations. Diabetes mellitus type 2 on sliding scale coverage. Chronic anemia we will follow CBC check anemia panel. Hypertension we will continue home medications. History of sinus node dysfunction status post pacemaker placement.  Unable to reach patient's daughter to get further history. Home medications needs to be verified.   DVT prophylaxis: Lovenox. Code Status: Full code. Family Communication: Unable to reach patient's daughter. Disposition Plan: May need rehab. Consults called: Physical therapy. Admission status: Observation.   Eduard Clos MD [facility] Pager [phone].  If 7PM-7AM, please contact night-coverage [email or web address] Password [facility]  01/21/2021, 6:39 AM

## 2021-01-22 DIAGNOSIS — E119 Type 2 diabetes mellitus without complications: Secondary | ICD-10-CM

## 2021-01-22 DIAGNOSIS — Z515 Encounter for palliative care: Secondary | ICD-10-CM

## 2021-01-22 DIAGNOSIS — D649 Anemia, unspecified: Secondary | ICD-10-CM

## 2021-01-22 DIAGNOSIS — Z7189 Other specified counseling: Secondary | ICD-10-CM

## 2021-01-22 DIAGNOSIS — R531 Weakness: Secondary | ICD-10-CM

## 2021-01-22 LAB — BASIC METABOLIC PANEL
Anion gap: 7 (ref 5–15)
BUN: 27 mg/dL — ABNORMAL HIGH (ref 8–23)
CO2: 24 mmol/L (ref 22–32)
Calcium: 7.4 mg/dL — ABNORMAL LOW (ref 8.9–10.3)
Chloride: 108 mmol/L (ref 98–111)
Creatinine, Ser: 0.82 mg/dL (ref 0.61–1.24)
GFR, Estimated: >60 mL/min (ref >60)
Glucose, Bld: 102 mg/dL — ABNORMAL HIGH (ref 70–99)
Potassium: 3.7 mmol/L (ref 3.5–5.1)
Sodium: 139 mmol/L (ref 135–145)

## 2021-01-22 LAB — CBC WITH DIFFERENTIAL/PLATELET
Abs Immature Granulocytes: 0.07 10*3/uL (ref 0.00–0.07)
Basophils Absolute: 0 10*3/uL (ref 0.0–0.1)
Basophils Relative: 0 %
Eosinophils Absolute: 0.1 10*3/uL (ref 0.0–0.5)
Eosinophils Relative: 1 %
HCT: 37.1 % — ABNORMAL LOW (ref 39.0–52.0)
Hemoglobin: 12.3 g/dL — ABNORMAL LOW (ref 13.0–17.0)
Immature Granulocytes: 1 %
Lymphocytes Relative: 40 %
Lymphs Abs: 3.7 10*3/uL (ref 0.7–4.0)
MCH: 29.1 pg (ref 26.0–34.0)
MCHC: 33.2 g/dL (ref 30.0–36.0)
MCV: 87.9 fL (ref 80.0–100.0)
Monocytes Absolute: 0.7 10*3/uL (ref 0.1–1.0)
Monocytes Relative: 8 %
Neutro Abs: 4.5 10*3/uL (ref 1.7–7.7)
Neutrophils Relative %: 50 %
Platelets: 301 10*3/uL (ref 150–400)
RBC: 4.22 MIL/uL (ref 4.22–5.81)
RDW: 17.5 % — ABNORMAL HIGH (ref 11.5–15.5)
WBC: 9.1 10*3/uL (ref 4.0–10.5)
nRBC: 0 % (ref 0.0–0.2)

## 2021-01-22 LAB — URINE CULTURE: Culture: <10000 — AB

## 2021-01-22 LAB — LEGIONELLA PNEUMOPHILA SEROGP 1 UR AG: L. pneumophila Serogp 1 Ur Ag: NEGATIVE

## 2021-01-22 LAB — GLUCOSE, CAPILLARY
Glucose-Capillary: 101 mg/dL — ABNORMAL HIGH (ref 70–99)
Glucose-Capillary: 164 mg/dL — ABNORMAL HIGH (ref 70–99)
Glucose-Capillary: 71 mg/dL (ref 70–99)
Glucose-Capillary: 105 mg/dL — ABNORMAL HIGH (ref 70–99)

## 2021-01-22 LAB — MAGNESIUM: Magnesium: 1.7 mg/dL (ref 1.7–2.4)

## 2021-01-22 MED ORDER — POTASSIUM CHLORIDE CRYS ER 10 MEQ PO TBCR
40.0000 meq | EXTENDED_RELEASE_TABLET | Freq: Once | ORAL | Status: AC
  Administered 2021-01-22: 40 meq via ORAL
  Filled 2021-01-22: qty 4

## 2021-01-22 MED ORDER — FLUTICASONE PROPIONATE 50 MCG/ACT NA SUSP
2.0000 | Freq: Every day | NASAL | Status: DC
Start: 2021-01-22 — End: 2021-01-25
  Administered 2021-01-22 – 2021-01-25 (×4): 2 via NASAL
  Filled 2021-01-22: qty 16

## 2021-01-22 MED ORDER — PANTOPRAZOLE SODIUM 40 MG PO TBEC
40.0000 mg | DELAYED_RELEASE_TABLET | Freq: Every day | ORAL | Status: DC
  Administered 2021-01-23 – 2021-01-25 (×3): 40 mg via ORAL
  Filled 2021-01-22 (×3): qty 1

## 2021-01-22 MED ORDER — SODIUM CHLORIDE 0.9 % IV SOLN: INTRAVENOUS | Status: DC

## 2021-01-22 MED ORDER — LORATADINE 10 MG PO TABS
10.0000 mg | ORAL_TABLET | Freq: Every day | ORAL | Status: DC
  Administered 2021-01-22 – 2021-01-25 (×4): 10 mg via ORAL
  Filled 2021-01-22 (×4): qty 1

## 2021-01-22 MED ORDER — SODIUM CHLORIDE 0.9 % IV SOLN: INTRAVENOUS | Status: AC

## 2021-01-22 MED ORDER — LEVALBUTEROL HCL 0.63 MG/3ML IN NEBU
0.6300 mg | INHALATION_SOLUTION | Freq: Three times a day (TID) | RESPIRATORY_TRACT | Status: DC
  Administered 2021-01-22 – 2021-01-23 (×2): 0.63 mg via RESPIRATORY_TRACT
  Filled 2021-01-22 (×2): qty 3

## 2021-01-22 MED ORDER — MAGNESIUM SULFATE 2 GM/50ML IV SOLN
2.0000 g | Freq: Once | INTRAVENOUS | Status: AC
  Administered 2021-01-22: 2 g via INTRAVENOUS
  Filled 2021-01-22: qty 50

## 2021-01-22 MED ORDER — IPRATROPIUM BROMIDE 0.02 % IN SOLN
0.5000 mg | Freq: Three times a day (TID) | RESPIRATORY_TRACT | Status: DC
  Administered 2021-01-22 – 2021-01-23 (×2): 0.5 mg via RESPIRATORY_TRACT
  Filled 2021-01-22 (×2): qty 2.5

## 2021-01-22 MED ORDER — LOPERAMIDE HCL 2 MG PO CAPS
2.0000 mg | ORAL_CAPSULE | ORAL | Status: DC | PRN
  Administered 2021-01-22 – 2021-01-25 (×3): 2 mg via ORAL
  Filled 2021-01-22 (×3): qty 1

## 2021-01-22 NOTE — Consult Note (Signed)
Consultation Note Date: 01/22/2021   Patient Name: Nathan Alvarez  DOB: November 14, 1924  MRN: [id number]  Age / Sex: 87 y.o., male  PCP: Corrington, Delsa Grana, MD Referring Physician: Eugenie Filler, MD  Reason for Consultation: Establishing goals of care  HPI/Patient Profile: 87 y.o. male  with past medical history of sinus node dysfunction status post permanent pacemaker, hypertension, diabetes, recent admission for enterocolitis and renal failure admitted on 01/20/2021 with fall at home and generalized failure to thrive.  Palliative consulted for goals of care.  Clinical Assessment and Goals of Care: Palliative care consult received.  Chart reviewed including personal review of pertinent labs and imaging.  I met today with Nathan Alvarez.  He was awake and alert and sitting in bedside chair no distress.  He reports goals being to get stronger to eventually return home.  He was short in conversation but did asked me to call and speak with his daughter.  I called and spoke with Nathan Alvarez (daughter-in-law).  I introduced palliative care as specialized medical care for people living with serious illness. It focuses on providing relief from the symptoms and stress of a serious illness. The goal is to improve quality of life for both the patient and the family.  We discussed clinical course as well as wishes moving forward in regard to advanced directives.  Values and goals of care important to patient and family were attempted to be elicited.  We discussed surrogate decision making and she tells me that he does not have a designated healthcare power of attorney but patient's 2 sons would work in conjunction to make decisions on his behalf.  He does have a living well and she will have her husband bring in a copy of this to the hospital for [place] to review.   We discussed that even prior to this admission and prior  to episode of colitis he was having decline in his nutrition and functional status.  We discussed continuing to assess these over the next several weeks as he attempts to rehab to best determine long-term care plan moving forward.   Questions and concerns addressed.   PMT will continue to support holistically.  SUMMARY OF RECOMMENDATIONS   -Full code/full scope. -Continue current care.  Family goal is for him to transition to skilled facility to see how much functional status he can regain.  [facility] and [facility] are [facility] from the [facility]. -Recommend palliative care to follow-up as an outpatient  Code Status/Advance Care Planning: Full code  Palliative Prophylaxis:  Frequent Pain Assessment  Additional Recommendations (Limitations, Scope, Preferences): Full Scope Treatment  Psycho-social/Spiritual:  Desire for further Chaplaincy support:no  Prognosis:  Unable to determine  Discharge Planning: [place] for rehab with Palliative care service follow-up      Primary Diagnoses: Present on Admission:  CAP (community acquired pneumonia)  Pacemaker  Sinus node dysfunction ([place])  Hypertension   I have reviewed the medical  record, interviewed the patient and family, and examined the patient. The following aspects are pertinent.  Past Medical History:  Diagnosis Date   A-fib ([place])    Dizziness    DM (diabetes mellitus) (HCC)    Esophagitis, reflux    Heart block    Hyperlipidemia    Hypertension, essential, benign    Memory deficit    Pathological fracture of left hip due to age-related osteoporosis ([place]) 04/25/2019   SOB (shortness of breath)    SSS (sick sinus syndrome) (HCC)    Social History   Socioeconomic History   Marital status: Married    Spouse name: Not on file   Number of children: Not on file   Years of education: Not on file   Highest education level: Not on  file  Occupational History   Not on file  Tobacco Use   Smoking status: Former    Types: Cigarettes    Quit date: 01/01/1956    Years since quitting: 46.1    Passive exposure: Never   Smokeless tobacco: Never  Vaping Use   Vaping Use: Never used  Substance and Sexual Activity   Alcohol use: No   Drug use: No   Sexual activity: Not Currently    Comment: MARRIED  Other Topics Concern   Not on file  Social History Narrative   Not on file   Social Determinants of Health   Financial Resource Strain: Not on file  Food Insecurity: Not on file  Transportation Needs: Not on file  Physical Activity: Not on file  Stress: Not on file  Social Connections: Not on file   Family History  Problem Relation Age of Onset   Heart disease Mother    Scheduled Meds:  aspirin EC  81 mg Oral Daily   carvedilol  6.25 mg Oral BID   enoxaparin (LOVENOX) injection  40 mg Subcutaneous Q24H   fluticasone  2 spray Each Nare Daily   insulin aspart  0-9 Units Subcutaneous TID WC   ipratropium  0.5 mg Nebulization TID   levalbuterol  0.63 mg Nebulization TID   loratadine  10 mg Oral Daily   mirtazapine  7.5 mg Oral QHS   [START ON 01/23/2021] pantoprazole  40 mg Oral Q0600   tamsulosin  0.4 mg Oral Daily   Continuous Infusions:  sodium chloride 75 mL/hr at 01/22/21 0909   azithromycin 500 mg (01/21/21 2354)   cefTRIAXone (ROCEPHIN)  IV 2 g (01/21/21 2307)   PRN Meds:.acetaminophen **OR** acetaminophen, loperamide Medications Prior to Admission:  Prior to Admission medications   Medication Sig Start Date End Date Taking? Authorizing Provider  acetaminophen (TYLENOL) 325 MG tablet Take 2 tablets (650 mg total) by mouth every 6 (six) hours as needed for mild pain (or Fever >/= 101). 12/16/20  Yes Roxan Hockey, MD  aspirin EC 81 MG EC tablet Take 1 tablet (81 mg total) by mouth daily with breakfast. Swallow whole. 12/16/20  Yes Emokpae, Courage, MD  calcium carbonate (OS-CAL - DOSED IN MG OF  ELEMENTAL CALCIUM) 1250 (500 Ca) MG tablet Take 1 tablet (500 mg of elemental calcium total) by mouth 2 (two) times daily with a meal. 12/16/20  Yes Emokpae, Courage, MD  carvedilol (COREG) 6.25 MG tablet Take 1 tablet (6.25 mg total) by mouth 2 (two) times daily. For BP and Heart 12/16/20  Yes Emokpae, Courage, MD  Ferrous Sulfate (IRON) 325 (65 Fe) MG TABS Take 28 mg by mouth daily.   Yes [provider]  JANUVIA 50 MG tablet Take 1 tablet (50 mg total) by mouth daily. 12/16/20  Yes Emokpae, Courage, MD  mirtazapine (REMERON) 7.5 MG tablet Take 1 tablet (7.5 mg total) by mouth at bedtime. For appetite stimulation and mood 12/16/20  Yes Emokpae, Courage, MD  tamsulosin (FLOMAX) 0.4 MG CAPS capsule Take 1 capsule (0.4 mg total) by mouth daily. Patient not taking: Reported on 01/21/2021 12/16/20   Roxan Hockey, MD   Allergies  Allergen Reactions   Penicillins     unknown   Review of Systems Denies complaints but poor historian  Physical Exam General: Alert, awake, in no acute distress.   HEENT: No bruits, no goiter, no JVD Heart: Regular rate and rhythm. No murmur appreciated. Lungs: Good air movement, clear Abdomen: Soft, nontender, nondistended, positive bowel sounds.   Ext: No significant edema Skin: Warm and dry Neuro: Grossly intact, nonfocal.  Vital Signs: BP (!) 142/94 (BP Location: Right Arm)   Pulse 62   Temp 98.2 F (36.8 C)   Resp 18   Ht 5' 7"  (1.702 m)   Wt 58.3 kg   SpO2 98%   BMI 20.13 kg/m  Pain Scale: 0-10   Pain Score: 0-No pain   SpO2: SpO2: 98 % O2 Device:SpO2: 98 % O2 Flow Rate: .   IO: Intake/output summary:  Intake/Output Summary (Last 24 hours) at 01/22/2021 2254 Last data filed at 01/22/2021 1800 Gross per 24 hour  Intake 2302.05 ml  Output 400 ml  Net 1902.05 ml    LBM: Last BM Date: 01/21/21 Baseline Weight: Weight: 58.3 kg Most recent weight: Weight: 58.3 kg     Palliative Assessment/Data:   Flowsheet Rows    Flowsheet  Row Most Recent Value  Intake Tab   Referral Department Hospitalist  Unit at Time of Referral Med/Surg Unit  Palliative Care Primary Diagnosis Sepsis/Infectious Disease  Date Notified 01/20/21  Palliative Care Type New Palliative care  Reason for referral Clarify Goals of Care  Date of Admission 01/20/21  Date first seen by Palliative Care 01/22/21  # of days Palliative referral response time 2 Day(s)  # of days IP prior to Palliative referral 0  Clinical Assessment   Palliative Performance Scale Score 40%  Psychosocial & Spiritual Assessment   Palliative Care Outcomes   Patient/Family meeting held? Yes  Who was at the meeting? patient, daughter via phone  Palliative Care Outcomes Clarified goals of care       Time In: 1500 Time Out: 1600 Time Total: 60 Greater than 50%  of this time was spent counseling and coordinating care related to the above assessment and plan.  Signed by: Micheline Rough, MD   Please contact Palliative Medicine Team phone at [phone] for questions and concerns.  For individual provider: See Shea Evans

## 2021-01-22 NOTE — [place] FL2 (Signed)
[place] MEDICAID FL2 LEVEL OF CARE SCREENING TOOL     IDENTIFICATION  Patient Name: Nathan Alvarez Birthdate: 11-Jul-1924 Sex: male Admission Date (Current Location): 01/20/2021  [facility] and [place] Number:  Producer, television/film/video and Address:  [facility],  [address] [place], [place] [address]      Provider Number: [id number]  Attending Physician Name and Address:  Rodolph Bong, MD  Relative Name and Phone Number:  Vash Quezada dtr [phone]    Current Level of Care: Hospital Recommended Level of Care: Skilled Nursing Facility Prior Approval Number:    Date Approved/Denied:   PASRR Number: [id number]  Discharge Plan: SNF    Current Diagnoses: Patient Active Problem List   Diagnosis Date Noted   CAP (community acquired pneumonia) 01/21/2021   Pressure injury of skin 01/21/2021   Fall    Aspiration pneumonia (HCC) 12/15/2020   Sepsis due to aspiration pneumonia and enterocolitis 12/15/2020   AKI (acute kidney injury) (HCC) 12/14/2020   Enterocolitis-C diff test is Equivocal  12/14/2020   Sinus node dysfunction (HCC) 03/06/2020   Pacemaker 03/06/2020   Vitamin D deficiency 04/25/2019   Pathological fracture of left hip due to age-related osteoporosis (HCC) 04/25/2019   Femoral neck fracture (HCC) 04/21/2019   Hypertension 04/21/2019   Diabetes (HCC) 04/21/2019   Hyponatremia 04/21/2019   Anemia 04/21/2019    Orientation RESPIRATION BLADDER Height & Weight     Self, Time, Situation, Place  Normal Incontinent Weight: 58.3 kg Height:  5\' 7"  (170.2 cm)  BEHAVIORAL SYMPTOMS/MOOD NEUROLOGICAL BOWEL NUTRITION STATUS      Incontinent Diet (Dysphagia 3)  AMBULATORY STATUS COMMUNICATION OF NEEDS Skin   Limited Assist Verbally PU Stage and Appropriate Care PU Stage 1 Dressing: Daily (barrier cream)                     Personal Care Assistance Level of Assistance  Bathing, Feeding, Dressing Bathing Assistance: Limited assistance Feeding  assistance: Limited assistance Dressing Assistance: Limited assistance     Functional Limitations Info  Sight, Hearing, Speech Sight Info: Adequate Hearing Info: Impaired (Bilateral hearing aids) Speech Info: Impaired (Dysphagia)    SPECIAL CARE FACTORS FREQUENCY  PT (By licensed PT), OT (By licensed OT)     PT Frequency:  (5x week) OT Frequency:  (5x week)            Contractures Contractures Info: Not present    Additional Factors Info  Code Status, Psychotropic, Allergies Code Status Info:  (Full) Allergies Info:  (Penicillins) Psychotropic Info:  (Remeron HS)         Current Medications (01/22/2021):  This is the current hospital active medication list Current Facility-Administered Medications  Medication Dose Route Frequency Provider Last Rate Last Admin   0.9 %  sodium chloride infusion   Intravenous Continuous 01/24/2021, MD 75 mL/hr at 01/22/21 0909 Rate Change at 01/22/21 0909   acetaminophen (TYLENOL) tablet 650 mg  650 mg Oral Q6H PRN 01/24/21, MD       Or   acetaminophen (TYLENOL) suppository 650 mg  650 mg Rectal Q6H PRN Eduard Clos, MD       aspirin EC tablet 81 mg  81 mg Oral Daily Eduard Clos, MD   81 mg at 01/22/21 0913   azithromycin (ZITHROMAX) 500 mg in sodium chloride 0.9 % 250 mL IVPB  500 mg Intravenous Q24H 01/24/21, MD 250 mL/hr at 01/21/21 2354 500 mg at 01/21/21  2354   carvedilol (COREG) tablet 6.25 mg  6.25 mg Oral BID Eduard Clos, MD   6.25 mg at 01/22/21 0913   cefTRIAXone (ROCEPHIN) 2 g in sodium chloride 0.9 % 100 mL IVPB  2 g Intravenous Q24H Eduard Clos, MD 200 mL/hr at 01/21/21 2307 2 g at 01/21/21 2307   enoxaparin (LOVENOX) injection 40 mg  40 mg Subcutaneous Q24H Eduard Clos, MD   40 mg at 01/22/21 0913   insulin aspart (novoLOG) injection 0-9 Units  0-9 Units Subcutaneous TID WC Eduard Clos, MD   2 Units at 01/22/21 1203   loperamide (IMODIUM)  capsule 2 mg  2 mg Oral PRN Rodolph Bong, MD   2 mg at 01/22/21 1202   mirtazapine (REMERON) tablet 7.5 mg  7.5 mg Oral QHS Eduard Clos, MD   7.5 mg at 01/21/21 2135   tamsulosin (FLOMAX) capsule 0.4 mg  0.4 mg Oral Daily Eduard Clos, MD   0.4 mg at 01/22/21 1537     Discharge Medications: Please see discharge summary for a list of discharge medications.  Relevant Imaging Results:  Relevant Lab Results:   Additional Information SS#[id number] [place], [place]

## 2021-01-22 NOTE — TOC Progression Note (Signed)
Transition of Care [facility]) - Progression Note    Patient Details  Name: Jomo Forand MRN: [id number] Date of Birth: Oct 13, 1924  [facility] CM/SW Contact  Michale Emmerich, [place], RN Phone Number: 01/22/2021, 3:17 PM  Clinical Narrative: Faxed out await bed offers.      Expected Discharge Plan: Skilled Nursing Facility Barriers to Discharge: Continued Medical Work up  Expected Discharge Plan and Services Expected Discharge Plan: Skilled Nursing Facility   Discharge Planning Services: CM Consult Post Acute Care Choice:  (TBD) Living arrangements for the past 2 months: Single Family Home                                       Social Determinants of Health (SDOH) Interventions    Readmission Risk Interventions No flowsheet data found.

## 2021-01-22 NOTE — Progress Notes (Signed)
PROGRESS NOTE    Nathan Alvarez  MWU:[id number] DOB: 07-03-1924 DOA: 01/20/2021 PCP: Vivien Presto, MD    Chief Complaint  Patient presents with   Fall    Brief Narrative:  Patient is a pleasant 87 year old gentleman history of sinus node dysfunction status post PPM, hypertension, diabetes admitted last month for enterocolitis and renal failure sustained a fall at home when he tried to get up from the bed and slid on the floor denied LOC or hitting his head.  Patient called his daughter who brought him to the ED found to have generalized weakness over the past few days.  In the ED chest x-ray done concerning for pneumonia as well as patient noted to have generalized weakness.  COVID-19 PCR negative.  Potassium noted at 3.4.  Urine strep pneumococcus antigen positive.  Urine Legionella antigen pending. -Patient placed empirically on IV antibiotics.  Likely needs SNF placement   Assessment & Plan:   Principal Problem:   CAP (community acquired pneumonia) Active Problems:   Hypertension   Diabetes (HCC)   Sinus node dysfunction (HCC)   Pacemaker   Fall   Pressure injury of skin   Weakness generalized  #1 community-acquired pneumonia secondary to strep pneumoniae -Patient presented with generalized weakness, noted to have had a fall at home, chest x-ray done concerning for.  COVID-19 PCR done negative. -Urine strep pneumococcus antigen positive. -Urine Legionella antigen pending. -Continue IV Rocephin. -Continue IV azithromycin pending urine Legionella antigen.  Could likely discontinue IV azithromycin after 3 days if urine Legionella antigen has not fully resulted. -Placed on Xopenex, Atrovent nebs, Claritin, Flonase, PPI. -Supportive care.  2.  Hypertension -Controlled on Coreg, Flomax.  3.  Diabetes mellitus type 2 -Hemoglobin A1c 6.6 (12/14/2020). -CBG 101 this morning -Currently on dysphagia 3 diet. -Continue to hold oral hypoglycemic agents. -SSI.  4.  Chronic  anemia -Patient with no overt bleeding. -Hemoglobin stable at 12.3. -Follow H&H.  5.  History of sinus node dysfunction status post PPM -Stable.  6.  Generalized weakness -Likely multifactorial secondary to deconditioning, problem #1, recent treatment for diarrhea. -PT/OT. -Needs SNF.   DVT prophylaxis: Lovenox Code Status: Full Family Communication: Updated patient.  No family at bedside Disposition:   Status is: Inpatient  Remains inpatient appropriate because: Patient on IV antibiotics, and safe discharge plan requiring probable SNF when medically stable.       Consultants:  None  Procedures:  Chest x-ray 01/21/2021 Plain films of the left hip and pelvis 01/20/2021   Antimicrobials:  IV azithromycin 01/21/2021>>>> IV Rocephin 01/21/2021>>>>>>   Subjective: Patient sitting up in chair.  States does not feel any different than he did on admission.  Some shortness of breath.  No chest pain.  No abdominal pain.  Ate about 75% of his breakfast today per RN.  Objective: Vitals:   01/21/21 1322 01/21/21 2036 01/22/21 0459 01/22/21 1302  BP: 122/61 126/78 (!) 125/96 113/73  Pulse: 95 (!) 101 96 68  Resp: 14 18 20 16   Temp: 97.7 F (36.5 C) 98.6 F (37 C) 98.9 F (37.2 C) (!) 97.5 F (36.4 C)  TempSrc: Oral   Oral  SpO2: 93% 95% 95% 97%  Weight:      Height:        Intake/Output Summary (Last 24 hours) at 01/22/2021 1821 Last data filed at 01/22/2021 1400 Gross per 24 hour  Intake 2505.15 ml  Output 400 ml  Net 2105.15 ml   Filed Weights   01/21/21 0242  Weight:  58.3 kg    Examination:  General exam: Appears calm and comfortable  Respiratory system: Some scattered coarse breath sounds.  No wheezing.  Fair air movement.  Speaking in full sentences.  No use of accessory muscles of respiration.  Cardiovascular system: S1 & S2 heard, RRR. No JVD, murmurs, rubs, gallops or clicks. No pedal edema. Gastrointestinal system: Abdomen is nondistended, soft  and nontender. No organomegaly or masses felt. Normal bowel sounds heard. Central nervous system: Alert and oriented. No focal neurological deficits. Extremities: Symmetric 5 x 5 power. Skin: No rashes, lesions or ulcers Psychiatry: Judgement and insight appear normal. Mood & affect appropriate.     Data Reviewed: I have personally reviewed following labs and imaging studies  CBC: Recent Labs  Lab 01/20/21 2123 01/21/21 0641 01/22/21 0936  WBC 7.7 7.0 9.1  NEUTROABS 3.8 4.0 4.5  HGB 10.6* 11.6* 12.3*  HCT 31.8* 34.2* 37.1*  MCV 86.4 88.6 87.9  PLT 266 231 301    Basic Metabolic Panel: Recent Labs  Lab 01/20/21 2123 01/21/21 0641 01/22/21 0936  NA 136 138 139  K 3.4* 3.2* 3.7  CL 103 104 108  CO2 24 26 24   GLUCOSE 161* 122* 102*  BUN 36* 32* 27*  CREATININE 0.81 0.80 0.82  CALCIUM 7.8* 7.7* 7.4*  MG  --  1.8 1.7    GFR: Estimated Creatinine Clearance: 46.4 mL/min (by C-G formula based on SCr of 0.82 mg/dL).  Liver Function Tests: Recent Labs  Lab 01/20/21 2123 01/21/21 0641  AST 11* 13*  ALT 5 9  ALKPHOS 66 66  BILITOT 0.6 0.8  PROT 5.2* 5.1*  ALBUMIN 2.6* 2.2*    CBG: Recent Labs  Lab 01/21/21 1740 01/21/21 2038 01/22/21 0751 01/22/21 1122 01/22/21 1709  GLUCAP 117* 271* 101* 164* 71     Recent Results (from the past 240 hour(s))  Urine Culture     Status: Abnormal   Collection Time: 01/20/21 10:25 PM   Specimen: Urine, Clean Catch  Result Value Ref Range Status   Specimen Description   Final    URINE, CLEAN CATCH Performed at [facility] 01[facility], [address] [place], [place]    Special Requests   Final    NONE Performed at [facility], [address], [place], [place]    Culture (A)  Final    <10,000 COLONIES/mL INSIGNIFICANT GROWTH Performed at [facility], [address], [place], [place]    Report Status 01/22/2021 FINAL  Final  Resp Panel by RT-PCR  (Flu A&B, Covid) Urine, In & Out Cath     Status: None   Collection Time: 01/20/21 10:27 PM   Specimen: Urine, In & Out Cath; Nasopharyngeal(NP) swabs in vial transport medium  Result Value Ref Range Status   SARS Coronavirus 2 by RT PCR NEGATIVE NEGATIVE Final    Comment: (NOTE) SARS-CoV-2 target nucleic acids are NOT DETECTED.  The SARS-CoV-2 RNA is generally detectable in upper respiratory specimens during the acute phase of infection. The lowest concentration of SARS-CoV-2 viral copies this assay can detect is 138 copies/mL. A negative result does not preclude SARS-Cov-2 infection and should not be used as the sole basis for treatment or other patient management decisions. A negative result may occur with  improper specimen collection/handling, submission of specimen other than nasopharyngeal swab, presence of viral mutation(s) within the areas targeted by this assay, and inadequate number of viral copies(<138 copies/mL). A negative result must be combined with clinical  observations, patient history, and epidemiological information. The expected result is Negative.  Fact Sheet for Patients:  BloggerCourse.com  Fact Sheet for Healthcare Providers:  SeriousBroker.it  This test is no t yet approved or cleared by the [place] FDA and  has been authorized for detection and/or diagnosis of SARS-CoV-2 by FDA under an Emergency Use Authorization (EUA). This EUA will remain  in effect (meaning this test can be used) for the duration of the COVID-19 declaration under Section 564(b)(1) of the Act, 21 U.S.C.section 360bbb-3(b)(1), unless the authorization is terminated  or revoked sooner.       Influenza A by PCR NEGATIVE NEGATIVE Final   Influenza B by PCR NEGATIVE NEGATIVE Final    Comment: (NOTE) The Xpert Xpress SARS-CoV-2/FLU/RSV plus assay is intended as an aid in the diagnosis of influenza from Nasopharyngeal swab specimens  and should not be used as a sole basis for treatment. Nasal washings and aspirates are unacceptable for Xpert Xpress SARS-CoV-2/FLU/RSV testing.  Fact Sheet for Patients: BloggerCourse.com  Fact Sheet for Healthcare Providers: SeriousBroker.it  This test is not yet approved or cleared by the [place] FDA and has been authorized for detection and/or diagnosis of SARS-CoV-2 by FDA under an Emergency Use Authorization (EUA). This EUA will remain in effect (meaning this test can be used) for the duration of the COVID-19 declaration under Section 564(b)(1) of the Act, 21 U.S.C. section 360bbb-3(b)(1), unless the authorization is terminated or revoked.  Performed at [facility], [address], [place], [place] [address]          Radiology Studies: DG Ribs Unilateral W/Chest Left  Result Date: 01/20/2021 CLINICAL DATA:  Fall EXAM: LEFT RIBS AND CHEST - 3+ VIEW COMPARISON:  12/15/2020 FINDINGS: Cardiomegaly. Left pacer remains in place, unchanged. Left lower lobe atelectasis or infiltrate. No visible displaced rib fracture. No effusion or pneumothorax. IMPRESSION: No visible displaced rib fracture. Left lower lobe atelectasis or infiltrate. Electronically Signed   By: Charlett Nose M.D.   On: 01/20/2021 22:17   DG CHEST PORT 1 VIEW  Result Date: 01/21/2021 CLINICAL DATA:  Dyspnea EXAM: PORTABLE CHEST 1 VIEW COMPARISON:  12/15/2020 FINDINGS: Dual-chamber pacer leads from the left. Chronic cardiomegaly. Stable hazy density at the left base where there is pleural fluid. Lucency at the peripheral left chest best attributed to skin folds which were also seen elsewhere. IMPRESSION: Stable from yesterday.  Left lower lobe atelectasis or infiltrate. Electronically Signed   By: Tiburcio Pea M.D.   On: 01/21/2021 06:12   DG Hip Unilat W or Wo Pelvis 2-3 Views Left  Result Date: 01/20/2021 CLINICAL DATA:  Fall EXAM:  DG HIP (WITH OR WITHOUT PELVIS) 2-3V LEFT COMPARISON:  04/22/2019 FINDINGS: No acute fracture, subluxation or dislocation. Prior left hip replacement. No hardware complicating feature. IMPRESSION: No acute bony abnormality. Electronically Signed   By: Charlett Nose M.D.   On: 01/20/2021 22:18        Scheduled Meds:  aspirin EC  81 mg Oral Daily   carvedilol  6.25 mg Oral BID   enoxaparin (LOVENOX) injection  40 mg Subcutaneous Q24H   fluticasone  2 spray Each Nare Daily   insulin aspart  0-9 Units Subcutaneous TID WC   ipratropium  0.5 mg Nebulization TID   levalbuterol  0.63 mg Nebulization TID   loratadine  10 mg Oral Daily   mirtazapine  7.5 mg Oral QHS   [START ON 01/23/2021] pantoprazole  40 mg Oral Q0600   tamsulosin  0.4  mg Oral Daily   Continuous Infusions:  sodium chloride 75 mL/hr at 01/22/21 2248   azithromycin 500 mg (01/21/21 2354)   cefTRIAXone (ROCEPHIN)  IV 2 g (01/21/21 2307)     LOS: 1 day    Time spent: 35 minutes    Ramiro Harvest, MD [facility]   To contact the attending provider between 7A-7P or the covering provider during after hours 7P-7A, please log into the web site [email or web address] and access using universal [place] password for that web site. If you do not have the password, please call the hospital operator.  01/22/2021, 6:21 PM

## 2021-01-22 NOTE — Evaluation (Signed)
Occupational Therapy Evaluation Patient Details Name: Nathan Alvarez MRN: [id number] DOB: Jul 28, 1924 Today's Date: 01/22/2021   History of Present Illness 87 yo male admitted with Pna, fall, weakness. Hx of Afib, dizziness, DM, HB, SSS-pacemaker, asp pna, L hp fx, hearing loss   Clinical Impression   Patient is currently requiring assistance with ADLs including moderate to maximum assist with toileting, moderate to maximum assist with LE dressing, moderate assist with LB  bathing, and supervision/setup assist, all of which is below patient's typical baseline of being assisted with bathing by his private caregiver who is at pt's home for ~8 hours a day per his report "I think that's right".  During this evaluation, patient was limited by generalized weakness, impaired activity tolerance, and incontinence of bowel, all of which has the potential to impact patient's safety and independence during functional mobility, as well as performance for ADLs.  Patient lives alone and demonstrates good rehab potential, and should benefit from continued skilled occupational therapy services while in acute care to maximize safety, independence and quality of life at home.  Continued occupational therapy services in a SNF setting prior to return home is recommended.  ?    Recommendations for follow up therapy are one component of a multi-disciplinary discharge planning process, led by the attending physician.  Recommendations may be updated based on patient status, additional functional criteria and insurance authorization.   Follow Up Recommendations  SNF    Equipment Recommendations   (Will defer to next venue)    Recommendations for Other Services       Precautions / Restrictions Precautions Precautions: Fall Restrictions Weight Bearing Restrictions: No      Mobility Bed Mobility Overal bed mobility: Needs Assistance Bed Mobility: Supine to Sit     Supine to sit: HOB elevated;Supervision      General bed mobility comments: Increased time.HOB elevated with heavy use of bed rails. Cues provided.    Transfers Overall transfer level: Needs assistance Equipment used: Rolling walker (2 wheeled) Transfers: Sit to/from Stand Sit to Stand: Min assist;From elevated surface         General transfer comment: Assist to rise, steady, control descent. Cues for safety, sequencing, hand placement    Balance Overall balance assessment: Needs assistance Sitting-balance support: Feet supported Sitting balance-Leahy Scale: Good     Standing balance support: Bilateral upper extremity supported Standing balance-Leahy Scale: Poor                             ADL either performed or assessed with clinical judgement   ADL Overall ADL's : Needs assistance/impaired Eating/Feeding: Set up Eating/Feeding Details (indicate cue type and reason): Able to feed self bed level with setup of tray. Grooming: Standing;Min guard;Oral care;Cueing for sequencing Grooming Details (indicate cue type and reason): Pt performed oral care standing at sink with a loss of balance with hand slipping on vanity/sink and pt moved to Bil forearms for support with Min guard consistently provided with safety. Upper Body Bathing: Min guard;Sitting;Cueing for sequencing   Lower Body Bathing: Moderate assistance;Sitting/lateral leans Lower Body Bathing Details (indicate cue type and reason): Pt was incontinent of bowel on way to bathroom with stool on lower legs. Mod-MAx As to wash lower legs and feet while pt sitting on commode. Upper Body Dressing : Minimal assistance;Sitting Upper Body Dressing Details (indicate cue type and reason): To don posterior gown at EOB Lower Body Dressing: Maximal assistance;Sitting/lateral leans Lower Body Dressing Details (indicate  cue type and reason): Max As to doff soiled socks while seated on commode Toilet Transfer: Minimal assistance;Regular Toilet;Grab  bars;RW;Ambulation Toilet Transfer Details (indicate cue type and reason): Pt ambulated with RW to bathroom with Min guard and descending to toilet with Min As and cues for use of grab bar. Toileting- Clothing Manipulation and Hygiene: Maximal assistance;Cueing for sequencing Toileting - Clothing Manipulation Details (indicate cue type and reason): Pt reports that he wears a diaper brief at home to avoid rushing to bathroom due to occasional loose, uncontrolled stool. During evaluation pt was incotinent of stool on way to bathroom, stood to urinate, but missed toilet entirely with pt stating, "what's wrong with me? Why am I missing the toilet?" demonstrating awareness of deficits.     Functional mobility during ADLs: Min guard;Minimal assistance;Rolling walker       Vision Baseline Vision/History: 1 Wears glasses Ability to See in Adequate Light: 0 Adequate Patient Visual Report: No change from baseline Additional Comments: Pt reports a pending appt with eye doctor in January.     Perception     Praxis      Pertinent Vitals/Pain Pain Assessment: No/denies pain     Hand Dominance Right   Extremity/Trunk Assessment Upper Extremity Assessment Upper Extremity Assessment: Generalized weakness   Lower Extremity Assessment Lower Extremity Assessment: Generalized weakness   Cervical / Trunk Assessment Cervical / Trunk Assessment: Kyphotic   Communication Communication Communication: HOH   Cognition Arousal/Alertness: Awake/alert Behavior During Therapy: WFL for tasks assessed/performed Overall Cognitive Status: History of cognitive impairments - at baseline                                 General Comments: hx of dementia   General Comments       Exercises     Shoulder Instructions      Home Living Family/patient expects to be discharged to:: Skilled nursing facility Living Arrangements: Alone Available Help at Discharge: Family;Available  PRN/intermittently;Personal care attendant Type of Home: House Home Access: Stairs to enter [employer] of Steps: 2-3 Entrance Stairs-Rails: Right Home Layout: One level               Home Equipment: Walker - 2 wheels;Shower seat;Grab bars - tub/shower;Walker - 4 wheels          Prior Functioning/Environment Level of Independence: Independent with assistive device(s);Needs assistance  Gait / Transfers Assistance Needed: uses rollator for ambulation outside and 2WW for ambulation inside. ADL's / Homemaking Assistance Needed: Pt has a private caregiver who pt reports comes 7 days a week from ~8:00am-5:00pm and assists with bathing, meal prep, and laundry. Pt's DIL provides assist with medication mngt and transportation as pt does not drive.            OT Problem List: Decreased strength;Cardiopulmonary status limiting activity;Decreased activity tolerance;Decreased safety awareness;Impaired balance (sitting and/or standing);Decreased knowledge of use of DME or AE;Decreased knowledge of precautions      OT Treatment/Interventions: Self-care/ADL training;Therapeutic exercise;Therapeutic activities;Cognitive remediation/compensation;Energy conservation;DME and/or AE instruction;Patient/family education;Balance training    OT Goals(Current goals can be found in the care plan section) Acute Rehab OT Goals Patient Stated Goal: "be able to dress myself" OT Goal Formulation: With patient Time For Goal Achievement: 02/05/21 Potential to Achieve Goals: Good ADL Goals Pt Will Perform Grooming: with supervision;standing Pt Will Perform Lower Body Dressing: with modified independence;sitting/lateral leans;sit to/from stand;with adaptive equipment Pt Will Transfer to Toilet: with modified independence;ambulating;regular  height toilet;grab bars Pt Will Perform Toileting - Clothing Manipulation and hygiene: with modified independence;sitting/lateral leans;sit to/from  stand Additional ADL Goal #1: Patient will tolerate BUE home exercise program 10-15 reps within pain-free ranges, in an unsupported seated position, in order to improve upper body strength, endurance and core stability needed to complete home ADLs.  OT Frequency: Min 2X/week   Barriers to D/C: Decreased caregiver support  Alone at night       Co-evaluation              AM-PAC OT "6 Clicks" Daily Activity     Outcome Measure Help from another person eating meals?: None Help from another person taking care of personal grooming?: A Little Help from another person toileting, which includes using toliet, bedpan, or urinal?: A Lot Help from another person bathing (including washing, rinsing, drying)?: A Lot Help from another person to put on and taking off regular upper body clothing?: A Little Help from another person to put on and taking off regular lower body clothing?: A Lot 6 Click Score: 16   End of Session Equipment Utilized During Treatment: Gait belt;Rolling walker Nurse Communication: Other (comment) (Pt on commode)  Activity Tolerance: Patient tolerated treatment well Patient left: Other (comment);with call bell/phone within reach (On toilet. Pt oriented to pull corn and RN notified.)  OT Visit Diagnosis: Unsteadiness on feet (R26.81);History of falling (Z91.81);Muscle weakness (generalized) (M62.81)                Time: 1000-1035 OT Time Calculation (min): 35 min Charges:  OT General Charges $OT Visit: 1 Visit OT Evaluation $OT Eval Low Complexity: 1 Low OT Treatments $Self Care/Home Management : 8-22 mins  Victorino Dike, OT Acute Rehab Services Office: [phone] 01/22/2021 Theodoro Clock 01/22/2021, 11:23 AM

## 2021-01-23 LAB — CBC WITH DIFFERENTIAL/PLATELET
Abs Immature Granulocytes: 0.05 10*3/uL (ref 0.00–0.07)
Basophils Absolute: 0 10*3/uL (ref 0.0–0.1)
Basophils Relative: 1 %
Eosinophils Absolute: 0.1 10*3/uL (ref 0.0–0.5)
Eosinophils Relative: 1 %
HCT: 31 % — ABNORMAL LOW (ref 39.0–52.0)
Hemoglobin: 9.9 g/dL — ABNORMAL LOW (ref 13.0–17.0)
Immature Granulocytes: 1 %
Lymphocytes Relative: 37 %
Lymphs Abs: 2.2 10*3/uL (ref 0.7–4.0)
MCH: 28.9 pg (ref 26.0–34.0)
MCHC: 31.9 g/dL (ref 30.0–36.0)
MCV: 90.4 fL (ref 80.0–100.0)
Monocytes Absolute: 0.5 10*3/uL (ref 0.1–1.0)
Monocytes Relative: 9 %
Neutro Abs: 3.1 10*3/uL (ref 1.7–7.7)
Neutrophils Relative %: 51 %
Platelets: 217 10*3/uL (ref 150–400)
RBC: 3.43 MIL/uL — ABNORMAL LOW (ref 4.22–5.81)
RDW: 17.8 % — ABNORMAL HIGH (ref 11.5–15.5)
WBC: 6 10*3/uL (ref 4.0–10.5)
nRBC: 0 % (ref 0.0–0.2)

## 2021-01-23 LAB — BASIC METABOLIC PANEL
Anion gap: 4 — ABNORMAL LOW (ref 5–15)
BUN: 25 mg/dL — ABNORMAL HIGH (ref 8–23)
CO2: 24 mmol/L (ref 22–32)
Calcium: 7.4 mg/dL — ABNORMAL LOW (ref 8.9–10.3)
Chloride: 110 mmol/L (ref 98–111)
Creatinine, Ser: 0.71 mg/dL (ref 0.61–1.24)
GFR, Estimated: >60 mL/min (ref >60)
Glucose, Bld: 107 mg/dL — ABNORMAL HIGH (ref 70–99)
Potassium: 4 mmol/L (ref 3.5–5.1)
Sodium: 138 mmol/L (ref 135–145)

## 2021-01-23 LAB — MAGNESIUM: Magnesium: 2.1 mg/dL (ref 1.7–2.4)

## 2021-01-23 LAB — GLUCOSE, CAPILLARY
Glucose-Capillary: 101 mg/dL — ABNORMAL HIGH (ref 70–99)
Glucose-Capillary: 174 mg/dL — ABNORMAL HIGH (ref 70–99)
Glucose-Capillary: 116 mg/dL — ABNORMAL HIGH (ref 70–99)
Glucose-Capillary: 137 mg/dL — ABNORMAL HIGH (ref 70–99)

## 2021-01-23 MED ORDER — LEVALBUTEROL HCL 0.63 MG/3ML IN NEBU
0.6300 mg | INHALATION_SOLUTION | Freq: Two times a day (BID) | RESPIRATORY_TRACT | Status: DC
  Administered 2021-01-23 – 2021-01-25 (×4): 0.63 mg via RESPIRATORY_TRACT
  Filled 2021-01-23 (×4): qty 3

## 2021-01-23 MED ORDER — IPRATROPIUM BROMIDE 0.02 % IN SOLN
0.5000 mg | Freq: Two times a day (BID) | RESPIRATORY_TRACT | Status: DC
  Administered 2021-01-23 – 2021-01-25 (×4): 0.5 mg via RESPIRATORY_TRACT
  Filled 2021-01-23 (×4): qty 2.5

## 2021-01-23 NOTE — Plan of Care (Signed)
  Problem: Clinical Measurements: Goal: Ability to maintain clinical measurements within normal limits will improve Outcome: Progressing Goal: Cardiovascular complication will be avoided Outcome: Progressing   Problem: Activity: Goal: Risk for activity intolerance will decrease Outcome: Progressing   Problem: Coping: Goal: Level of anxiety will decrease Outcome: Progressing   Problem: Elimination: Goal: Will not experience complications related to bowel motility Outcome: Progressing

## 2021-01-23 NOTE — TOC Progression Note (Addendum)
Transition of Care [facility]) - Progression Note    Patient Details  Name: Nathan Alvarez MRN: [id number] Date of Birth: 1925-02-06  Transition of [facility]) CM/SW Contact  Karson Reede, Olegario Messier, RN Phone Number: 01/23/2021, 9:30 AM  Clinical Narrative: Bed offers given to dtr Hilda Lias await choice.   10:24a-[facility] chosen by dtr-Marie.   Expected Discharge Plan: Skilled Nursing Facility Barriers to Discharge: Continued Medical Work up  Expected Discharge Plan and Services Expected Discharge Plan: Skilled Nursing Facility   Discharge Planning Services: CM Consult Post Acute Care Choice:  (TBD) Living arrangements for the past 2 months: Single Family Home                                       Social Determinants of Health (SDOH) Interventions    Readmission Risk Interventions No flowsheet data found.

## 2021-01-23 NOTE — Progress Notes (Signed)
PROGRESS NOTE    Nathan Alvarez  XBJ:[id number] DOB: 25-Oct-1924 DOA: 01/20/2021 PCP: Vivien Presto, MD    Brief Narrative:  Nathan Alvarez was admitted to the hospital with the working diagnosis of community acquired pneumonia (strep pneumoniae).   87 yo male with the past medical history of HTN, T2DM, sinus node dysfunction sp pacer who presented after sustaining a fall at home. Prior hospitalization last month for enterocolitis and renal failure. He fell from his bed while trying to get up. Her daughter brought him to the hospital. On his initial physical examination his blood pressure was 145/79, HR 74, RR 24 and oxygen saturation 97%, temp 97,6. His lungs had no rales or rhonchi, heart S1 and S2 present and rhythmic, abdomen soft and no lower extremity edema,   Wbc 6,0,  SARS COVID 19 negative  Chest film with cardiomegaly and left lower lobe infiltrate.   Patient was placed on antibiotic therapy with good toleration. Urine antigen positive for streptococcus pneumoniae.   Assessment & Plan:   Principal Problem:   CAP (community acquired pneumonia) Active Problems:   Hypertension   Diabetes (HCC)   Sinus node dysfunction (HCC)   Pacemaker   Fall   Pressure injury of skin   Weakness generalized   Left lower lobe community acquired pneumonia, streptococcus pneumoniae. Present on admission.   Patient is feeling better, but continue to be weak and deconditioned not yet back to baseline. Uses a walker for ambulation.   Plan to continue antibiotic therapy with ceftriaxone. Continue to follow up cell count  2. HTN. Continue blood pressure control with carvedilol  3. T2DM. Continue glucose cover and monitoring with insulin sliding scale Patient is tolerating po well.   4. Chronic anemia. Cell count has been stable   5. Sacrum stage 2 pressure ulcer, present on admission, continue local skin care.   6. Depression. Continue with mirtazapine.   Patient continue to be  at high risk for  Status is: Inpatient  Remains inpatient appropriate because: iv antibiotics        DVT prophylaxis:  Enoxaparin   Code Status:   full  Family Communication:   No family at the bedside       Skin Documentation: Pressure Injury 01/21/21 Sacrum Lower Stage 2 -  Partial thickness loss of dermis presenting as a shallow open injury with a red, pink wound bed without slough. (Active)  01/21/21 0230  Location: Sacrum  Location Orientation: Lower  Staging: Stage 2 -  Partial thickness loss of dermis presenting as a shallow open injury with a red, pink wound bed without slough.  Wound Description (Comments):   Present on Admission: Yes      Antimicrobials:  Ceftriaxone and azithromycin     Subjective: Patient with no nausea or vomiting, dyspnea continue to improve, he continue to be weak and deconditioned,.   Objective: Vitals:   01/22/21 2135 01/23/21 0520 01/23/21 0731 01/23/21 1109  BP:  (!) 160/80  (!) 130/91  Pulse:  78  76  Resp:  20  18  Temp:  98.7 F (37.1 C)  98 F (36.7 C)  TempSrc:  Oral  Axillary  SpO2: 98% 93% 95% 94%  Weight:  60.5 kg    Height:        Intake/Output Summary (Last 24 hours) at 01/23/2021 1605 Last data filed at 01/23/2021 1523 Gross per 24 hour  Intake 1267.07 ml  Output 475 ml  Net 792.07 ml   Filed Weights   01/21/21  7824 01/23/21 0520  Weight: 58.3 kg 60.5 kg    Examination:   General: Not in pain or dyspnea, deconditioned  Neurology: Awake and alert, non focal  E ENT: no pallor, no icterus, oral mucosa moist Cardiovascular: No JVD. S1-S2 present, rhythmic, no gallops, rubs, or murmurs. No lower extremity edema. Pulmonary: positive breath sounds bilaterally, with , no wheezing, rhonchi scattered rales. Gastrointestinal. Abdomen soft and non tender Skin. No rashes Musculoskeletal: no joint deformities     Data Reviewed: I have personally reviewed following labs and imaging studies  CBC: Recent  Labs  Lab 01/20/21 2123 01/21/21 0641 01/22/21 0936 01/23/21 0440  WBC 7.7 7.0 9.1 6.0  NEUTROABS 3.8 4.0 4.5 3.1  HGB 10.6* 11.6* 12.3* 9.9*  HCT 31.8* 34.2* 37.1* 31.0*  MCV 86.4 88.6 87.9 90.4  PLT 266 231 301 217   Basic Metabolic Panel: Recent Labs  Lab 01/20/21 2123 01/21/21 0641 01/22/21 0936 01/23/21 0440  NA 136 138 139 138  K 3.4* 3.2* 3.7 4.0  CL 103 104 108 110  CO2 24 26 24 24   GLUCOSE 161* 122* 102* 107*  BUN 36* 32* 27* 25*  CREATININE 0.81 0.80 0.82 0.71  CALCIUM 7.8* 7.7* 7.4* 7.4*  MG  --  1.8 1.7 2.1   GFR: Estimated Creatinine Clearance: 49.4 mL/min (by C-G formula based on SCr of 0.71 mg/dL). Liver Function Tests: Recent Labs  Lab 01/20/21 2123 01/21/21 0641  AST 11* 13*  ALT 5 9  ALKPHOS 66 66  BILITOT 0.6 0.8  PROT 5.2* 5.1*  ALBUMIN 2.6* 2.2*   No results for input(s): LIPASE, AMYLASE in the last 168 hours. Recent Labs  Lab 01/20/21 2227  AMMONIA 26   Coagulation Profile: No results for input(s): INR, PROTIME in the last 168 hours. Cardiac Enzymes: Recent Labs  Lab 01/20/21 2123  CKTOTAL 27*   BNP (last 3 results) No results for input(s): PROBNP in the last 8760 hours. HbA1C: No results for input(s): HGBA1C in the last 72 hours. CBG: Recent Labs  Lab 01/22/21 1122 01/22/21 1709 01/22/21 2042 01/23/21 0718 01/23/21 1105  GLUCAP 164* 71 105* 101* 174*   Lipid Profile: No results for input(s): CHOL, HDL, LDLCALC, TRIG, CHOLHDL, LDLDIRECT in the last 72 hours. Thyroid Function Tests: No results for input(s): TSH, T4TOTAL, FREET4, T3FREE, THYROIDAB in the last 72 hours. Anemia Panel: No results for input(s): VITAMINB12, FOLATE, FERRITIN, TIBC, IRON, RETICCTPCT in the last 72 hours.    Radiology Studies: I have reviewed all of the imaging during this hospital visit personally     Scheduled Meds:  aspirin EC  81 mg Oral Daily   carvedilol  6.25 mg Oral BID   enoxaparin (LOVENOX) injection  40 mg Subcutaneous  Q24H   fluticasone  2 spray Each Nare Daily   insulin aspart  0-9 Units Subcutaneous TID WC   ipratropium  0.5 mg Nebulization BID   levalbuterol  0.63 mg Nebulization BID   loratadine  10 mg Oral Daily   mirtazapine  7.5 mg Oral QHS   pantoprazole  40 mg Oral Q0600   tamsulosin  0.4 mg Oral Daily   Continuous Infusions:  cefTRIAXone (ROCEPHIN)  IV Stopped (01/23/21 0037)     LOS: 2 days        Javione Gunawan 01/25/21, MD

## 2021-01-23 NOTE — Progress Notes (Signed)
Physical Therapy Treatment Patient Details Name: Nathan Alvarez MRN: [id number] DOB: Nov 10, 1924 Today's Date: 01/23/2021   History of Present Illness 87 yo male admitted with Pna, fall, weakness. Hx of Afib, dizziness, DM, HB, SSS-pacemaker, asp pna, L hp fx, hearing loss    PT Comments    Pt ambulates 80 ft, min guard with occasional min A to maneuver RW safely around obstacles. Pt with dyspnea 3/4 on RA with SpO2 98%. Most difficulty with STS transfers, requiring mod A from low seated toilet and min A from EOB. Pt tolerates remaining up in chair at EOS with all needs in reach. Continue to progress as able.   Recommendations for follow up therapy are one component of a multi-disciplinary discharge planning process, led by the attending physician.  Recommendations may be updated based on patient status, additional functional criteria and insurance authorization.  Follow Up Recommendations  SNF     Equipment Recommendations  None recommended by PT    Recommendations for Other Services       Precautions / Restrictions Precautions Precautions: Fall Restrictions Weight Bearing Restrictions: No     Mobility  Bed Mobility Overal bed mobility: Needs Assistance Bed Mobility: Supine to Sit  Supine to sit: Min guard;HOB elevated  General bed mobility comments: increased time and effort with use of bedrail to upright trunk and scoot out, VC for hand placement    Transfers Overall transfer level: Needs assistance Equipment used: Rolling walker (2 wheeled) Transfers: Sit to/from Stand Sit to Stand: Min assist;Mod assist  General transfer comment: min A from elevated surface and mod A from low seated toilet, dependent on UE assisting and rocking momentum to power up  Ambulation/Gait Ambulation/Gait assistance: Min guard;Min assist Gait Distance (Feet): 80 Feet Assistive device: Rolling walker (2 wheeled) Gait Pattern/deviations: Step-through pattern;Decreased stride length Gait  velocity: decreased   General Gait Details: step through pattern with RW, good steadiness, occasional A with RW management around obstacles, limited by fatigue   Stairs             Wheelchair Mobility    Modified Rankin (Stroke Patients Only)       Balance Overall balance assessment: Needs assistance Sitting-balance support: Feet supported Sitting balance-Leahy Scale: Good  Standing balance support: Bilateral upper extremity supported Standing balance-Leahy Scale: Poor Standing balance comment: reliant on UE support       Cognition Arousal/Alertness: Awake/alert Behavior During Therapy: WFL for tasks assessed/performed Overall Cognitive Status: History of cognitive impairments - at baseline   General Comments: hx of dementia      Exercises      General Comments General comments (skin integrity, edema, etc.): dyspnea 3/4 with SpO2 98% on RA      Pertinent Vitals/Pain Pain Assessment: No/denies pain    Home Living                      Prior Function            PT Goals (current goals can now be found in the care plan section) Acute Rehab PT Goals Patient Stated Goal: to get stronger before transitioning to ALF PT Goal Formulation: With patient Time For Goal Achievement: 02/04/21 Potential to Achieve Goals: Good Progress towards PT goals: Progressing toward goals    Frequency    Min 2X/week      PT Plan Current plan remains appropriate    Co-evaluation              AM-PAC PT "6 Clicks"  Mobility   Outcome Measure  Help needed turning from your back to your side while in a flat bed without using bedrails?: A Little Help needed moving from lying on your back to sitting on the side of a flat bed without using bedrails?: A Little Help needed moving to and from a bed to a chair (including a wheelchair)?: A Little Help needed standing up from a chair using your arms (e.g., wheelchair or bedside chair)?: A Little Help needed to walk  in hospital room?: A Little Help needed climbing 3-5 steps with a railing? : A Little 6 Click Score: 18    End of Session Equipment Utilized During Treatment: Gait belt Activity Tolerance: Patient tolerated treatment well   Nurse Communication: Mobility status PT Visit Diagnosis: Muscle weakness (generalized) (M62.81);Difficulty in walking, not elsewhere classified (R26.2)     Time: 3244-0102 PT Time Calculation (min) (ACUTE ONLY): 22 min  Charges:  $Gait Training: 8-22 mins                      Tori Kenzly Rogoff PT, DPT 01/23/21, 3:38 PM

## 2021-01-24 LAB — GLUCOSE, CAPILLARY
Glucose-Capillary: 89 mg/dL (ref 70–99)
Glucose-Capillary: 191 mg/dL — ABNORMAL HIGH (ref 70–99)

## 2021-01-24 MED ORDER — SODIUM CHLORIDE 0.9 % IV SOLN
2.0000 g | INTRAVENOUS | Status: AC
  Administered 2021-01-24: 2 g via INTRAVENOUS
  Filled 2021-01-24: qty 20

## 2021-01-24 NOTE — TOC Progression Note (Signed)
Transition of Care [facility]) - Progression Note    Patient Details  Name: Nathan Alvarez MRN: [id number] Date of Birth: 01-Oct-1924  Transition of Care [facility]) CM/SW Contact  Lanisha Stepanian, Olegario Messier, RN Phone Number: 01/24/2021, 11:28 AM  Clinical Narrative:  Alcide Clever chosen rep Vincenza Hews aware-awaiting medical stability, & covid test to be done 24-48hrs of d/c. MD updated.     Expected Discharge Plan: Skilled Nursing Facility Barriers to Discharge: Continued Medical Work up  Expected Discharge Plan and Services Expected Discharge Plan: Skilled Nursing Facility   Discharge Planning Services: CM Consult Post Acute Care Choice:  (TBD) Living arrangements for the past 2 months: Single Family Home                                       Social Determinants of Health (SDOH) Interventions    Readmission Risk Interventions No flowsheet data found.

## 2021-01-24 NOTE — Progress Notes (Signed)
PROGRESS NOTE    Nathan Alvarez  BZJ:[id number] DOB: 10-28-1924 DOA: 01/20/2021 PCP: Vivien Presto, MD    Brief Narrative:  Nathan Alvarez was admitted to the hospital with the working diagnosis of community acquired pneumonia (strep pneumoniae).    87 yo male with the past medical history of HTN, T2DM, sinus node dysfunction sp pacer who presented after sustaining a fall at home. Prior hospitalization last month for enterocolitis and renal failure. He fell from his bed while trying to get up. Her daughter brought him to the hospital. On his initial physical examination his blood pressure was 145/79, HR 74, RR 24 and oxygen saturation 97%, temp 97,6. His lungs had no rales or rhonchi, heart S1 and S2 present and rhythmic, abdomen soft and no lower extremity edema,    Wbc 6,0,  SARS COVID 19 negative   Chest film with cardiomegaly and left lower lobe infiltrate.    Patient was placed on antibiotic therapy with good toleration. Urine antigen positive for streptococcus pneumoniae.   Assessment & Plan:   Principal Problem:   CAP (community acquired pneumonia) Active Problems:   Hypertension   Diabetes (HCC)   Sinus node dysfunction (HCC)   Pacemaker   Fall   Pressure injury of skin   Weakness generalized   Left lower lobe community acquired pneumonia, streptococcus pneumoniae. Present on admission.  Patient continue to feel very weak and deconditioned, no worsening dyspnea or chest pain.   His oxygenation is 98% on 2 L/min per [place]  Will plan to complete 5 days of antibiotic therapy with ceftriaxone for streptococcal pneumonia.   Continue with fluticasone for nasal congestion   Bronchodilator therapy.   2. HTN. On carvedilol for blood pressure control.    3. T2DM.  Fasting glucose is 107. Capillary 137, 89 and 191 Patient is tolerating po well.  Will discontinue sq insulin and check glucose as needed.   4. Chronic anemia.  Continue stable cell count.     5. Sacrum  stage 2 pressure ulcer, present on admission, continue local skin care.  Out of bed to chair tid with meals.    6. Depression. On mirtazapine.    Status is: Inpatient  Remains inpatient appropriate because: IV antibiotic therapy    DVT prophylaxis: Enoxaparin   Code Status:    full  Family Communication:  I spoke with patient's daughter in law at the bedside, we talked in detail about patient's condition, plan of care and prognosis and all questions were addressed.      Skin Documentation: Pressure Injury 01/21/21 Sacrum Lower Stage 2 -  Partial thickness loss of dermis presenting as a shallow open injury with a red, pink wound bed without slough. (Active)  01/21/21 0230  Location: Sacrum  Location Orientation: Lower  Staging: Stage 2 -  Partial thickness loss of dermis presenting as a shallow open injury with a red, pink wound bed without slough.  Wound Description (Comments):   Present on Admission: Yes     Antimicrobials:  Ceftriaxone     Subjective: Patient with no nausea or vomiting, no chest pain, no worsening dyspnea, continue to be very weak and deconditioned   Objective: Vitals:   01/23/21 2138 01/24/21 0514 01/24/21 0848 01/24/21 1102  BP: (!) 162/88   133/74  Pulse: 93 (!) 104  83  Resp: 18 18  20   Temp: 98.1 F (36.7 C) 98.3 F (36.8 C)  (!) 97.5 F (36.4 C)  TempSrc: Oral Oral  Axillary  SpO2: 96%  95% 98% 98%  Weight:      Height:        Intake/Output Summary (Last 24 hours) at 01/24/2021 1140 Last data filed at 01/24/2021 0900 Gross per 24 hour  Intake 600 ml  Output --  Net 600 ml   Filed Weights   01/21/21 0242 01/23/21 0520  Weight: 58.3 kg 60.5 kg    Examination:   General: Not in pain or dyspnea  Neurology: Awake and alert, non focal  E ENT: no pallor, no icterus, oral mucosa moist Cardiovascular: No JVD. S1-S2 present, rhythmic, no gallops, rubs, or murmurs. No lower extremity edema. Pulmonary: positive breath sounds  bilaterally,with, no wheezing, rhonchi, mild rales bilaterally. Gastrointestinal. Abdomen soft and non tender Skin. No rashes Musculoskeletal: no joint deformities/ left upper extremity non pitting edema.      Data Reviewed: I have personally reviewed following labs and imaging studies  CBC: Recent Labs  Lab 01/20/21 2123 01/21/21 0641 01/22/21 0936 01/23/21 0440  WBC 7.7 7.0 9.1 6.0  NEUTROABS 3.8 4.0 4.5 3.1  HGB 10.6* 11.6* 12.3* 9.9*  HCT 31.8* 34.2* 37.1* 31.0*  MCV 86.4 88.6 87.9 90.4  PLT 266 231 301 217   Basic Metabolic Panel: Recent Labs  Lab 01/20/21 2123 01/21/21 0641 01/22/21 0936 01/23/21 0440  NA 136 138 139 138  K 3.4* 3.2* 3.7 4.0  CL 103 104 108 110  CO2 24 26 24 24   GLUCOSE 161* 122* 102* 107*  BUN 36* 32* 27* 25*  CREATININE 0.81 0.80 0.82 0.71  CALCIUM 7.8* 7.7* 7.4* 7.4*  MG  --  1.8 1.7 2.1   GFR: Estimated Creatinine Clearance: 49.4 mL/min (by C-G formula based on SCr of 0.71 mg/dL). Liver Function Tests: Recent Labs  Lab 01/20/21 2123 01/21/21 0641  AST 11* 13*  ALT 5 9  ALKPHOS 66 66  BILITOT 0.6 0.8  PROT 5.2* 5.1*  ALBUMIN 2.6* 2.2*   No results for input(s): LIPASE, AMYLASE in the last 168 hours. Recent Labs  Lab 01/20/21 2227  AMMONIA 26   Coagulation Profile: No results for input(s): INR, PROTIME in the last 168 hours. Cardiac Enzymes: Recent Labs  Lab 01/20/21 2123  CKTOTAL 27*   BNP (last 3 results) No results for input(s): PROBNP in the last 8760 hours. HbA1C: No results for input(s): HGBA1C in the last 72 hours. CBG: Recent Labs  Lab 01/23/21 1105 01/23/21 1609 01/23/21 2136 01/24/21 0715 01/24/21 1057  GLUCAP 174* 116* 137* 89 191*   Lipid Profile: No results for input(s): CHOL, HDL, LDLCALC, TRIG, CHOLHDL, LDLDIRECT in the last 72 hours. Thyroid Function Tests: No results for input(s): TSH, T4TOTAL, FREET4, T3FREE, THYROIDAB in the last 72 hours. Anemia Panel: No results for input(s):  VITAMINB12, FOLATE, FERRITIN, TIBC, IRON, RETICCTPCT in the last 72 hours.    Radiology Studies: I have reviewed all of the imaging during this hospital visit personally     Scheduled Meds:  aspirin EC  81 mg Oral Daily   carvedilol  6.25 mg Oral BID   enoxaparin (LOVENOX) injection  40 mg Subcutaneous Q24H   fluticasone  2 spray Each Nare Daily   insulin aspart  0-9 Units Subcutaneous TID WC   ipratropium  0.5 mg Nebulization BID   levalbuterol  0.63 mg Nebulization BID   loratadine  10 mg Oral Daily   mirtazapine  7.5 mg Oral QHS   pantoprazole  40 mg Oral Q0600   tamsulosin  0.4 mg Oral Daily   Continuous Infusions:  cefTRIAXone (ROCEPHIN)  IV 2 g (01/23/21 2145)     LOS: 3 days        Rebekkah Powless Annett Gula, MD

## 2021-01-25 DIAGNOSIS — L89312 Pressure ulcer of right buttock, stage 2: Secondary | ICD-10-CM

## 2021-01-25 LAB — SARS CORONAVIRUS 2 (TAT 6-24 HRS): SARS Coronavirus 2: NEGATIVE

## 2021-01-25 NOTE — Discharge Summary (Signed)
Physician Discharge Summary  Nathan Alvarez QPY:[id number] DOB: 1925/02/19 DOA: 01/20/2021  PCP: Vivien Presto, MD  Admit date: 01/20/2021 Discharge date: 01/25/2021  Admitted From: home  Disposition:  SNF  Recommendations for Outpatient Follow-up and new medication changes:  Follow up with Dr Salvadore Farber in 7 to 10 days.  Patient completed antibiotic therapy during his hospitalization   Home Health: na   Equipment/Devices: na    Discharge Condition: stable  CODE STATUS: full  Diet recommendation:  regular   Brief/Interim Summary: Nathan Alvarez was admitted to the hospital with the working diagnosis of community acquired pneumonia (strep pneumoniae).    87 yo male with the past medical history of HTN, T2DM, sinus node dysfunction sp pacer who presented after sustaining a fall at home. Prior hospitalization last month for enterocolitis and renal failure. He fell from his bed while trying to get up. Her daughter in law brought him to the hospital. On his initial physical examination his blood pressure was 145/79, HR 74, RR 24 and oxygen saturation 97%, temp 97,6. His lungs had no rales or rhonchi, heart S1 and S2 present and rhythmic, abdomen soft and no lower extremity edema,   Sodium 136, potassium 4.2, chloride 111, bicarb 20, glucose 143, BUN 69, creatinine 1.39. Wbc 7.7, hemoglobin 10.6, hematocrit 31.8, platelets 266. SARS COVID 19 negative  Urine analysis specific gravity 1.026.   Chest film with cardiomegaly and left lower lobe infiltrate.   EKG 85 bpm, left axis deviation, normal intervals, sinus rhythm, PACs and PVCs, no significant ST segment or T wave changes.   Patient was placed on antibiotic therapy with good toleration. Urine antigen positive for streptococcus pneumoniae.  Patient clinically improved but continued to be very weak and deconditioned. Physical/ occupational therapy evaluation recommended continue therapy at a skilled nursing facility.  Left  lower lobe community-acquired pneumonia, Streptococcus pneumonia, present on admission.  No sepsis. Patient was admitted to the medical ward, he received intravenous antibiotic therapy with ceftriaxone. Legionella antigen was negative. He received supplemental oxygen per nasal cannula, bronchodilator therapy and nasal fluticasone. His symptoms improved, at discharge his oxygenation is 98% on 2 L per nasal cannula, and he has completed his antibiotic therapy during his hospitalization.  2.  Hypertension.  Continue blood pressure control with carvedilol.  3.  Type 2 diabetes mellitus.  Patient was placed on insulin sliding scale for glucose coverage and monitoring.  His glucose remained stable and insulin therapy was discontinued. At discharge resume Januvia.   4.  Chronic anemia.  His cell count remained stable.  5.  Sacrum stage II pressure ulcer present on admission, continue local skin care. Continue to encourage mobility, PT/OT.  6.  Depression.  Continue mirtazapine.  Discharge Diagnoses:  Principal Problem:   CAP (community acquired pneumonia) Active Problems:   Hypertension   Diabetes (HCC)   Sinus node dysfunction (HCC)   Pacemaker   Fall   Pressure injury of skin   Weakness generalized    Discharge Instructions  Discharge Instructions     Diet - low sodium heart healthy   Complete by: As directed    Discharge instructions   Complete by: As directed    Follow up with primary care in 7 to 10 days.   Increase activity slowly   Complete by: As directed    No wound care   Complete by: As directed       Allergies as of 01/25/2021       Reactions   Penicillins  unknown        Medication List     STOP taking these medications    tamsulosin 0.4 MG Caps capsule Commonly known as: FLOMAX       TAKE these medications    acetaminophen 325 MG tablet Commonly known as: TYLENOL Take 2 tablets (650 mg total) by mouth every 6 (six) hours as needed for  mild pain (or Fever >/= 101).   aspirin 81 MG EC tablet Take 1 tablet (81 mg total) by mouth daily with breakfast. Swallow whole.   calcium carbonate 1250 (500 Ca) MG tablet Commonly known as: OS-CAL - dosed in mg of elemental calcium Take 1 tablet (500 mg of elemental calcium total) by mouth 2 (two) times daily with a meal.   carvedilol 6.25 MG tablet Commonly known as: COREG Take 1 tablet (6.25 mg total) by mouth 2 (two) times daily. For BP and Heart   Iron 325 (65 Fe) MG Tabs Take 28 mg by mouth daily.   Januvia 50 MG tablet Generic drug: sitaGLIPtin Take 1 tablet (50 mg total) by mouth daily.   mirtazapine 7.5 MG tablet Commonly known as: REMERON Take 1 tablet (7.5 mg total) by mouth at bedtime. For appetite stimulation and mood        Contact information for after-discharge care     Destination     [facility] .   Service: Skilled Nursing Contact information: [address] [place] [address] [phone]                    Allergies  Allergen Reactions   Penicillins     unknown       Procedures/Studies: DG Ribs Unilateral W/Chest Left  Result Date: 01/20/2021 CLINICAL DATA:  Fall EXAM: LEFT RIBS AND CHEST - 3+ VIEW COMPARISON:  12/15/2020 FINDINGS: Cardiomegaly. Left pacer remains in place, unchanged. Left lower lobe atelectasis or infiltrate. No visible displaced rib fracture. No effusion or pneumothorax. IMPRESSION: No visible displaced rib fracture. Left lower lobe atelectasis or infiltrate. Electronically Signed   By: Charlett Nose M.D.   On: 01/20/2021 22:17   DG CHEST PORT 1 VIEW  Result Date: 01/21/2021 CLINICAL DATA:  Dyspnea EXAM: PORTABLE CHEST 1 VIEW COMPARISON:  12/15/2020 FINDINGS: Dual-chamber pacer leads from the left. Chronic cardiomegaly. Stable hazy density at the left base where there is pleural fluid. Lucency at the peripheral left chest best attributed to skin folds which were also  seen elsewhere. IMPRESSION: Stable from yesterday.  Left lower lobe atelectasis or infiltrate. Electronically Signed   By: Tiburcio Pea M.D.   On: 01/21/2021 06:12   DG Hip Unilat W or Wo Pelvis 2-3 Views Left  Result Date: 01/20/2021 CLINICAL DATA:  Fall EXAM: DG HIP (WITH OR WITHOUT PELVIS) 2-3V LEFT COMPARISON:  04/22/2019 FINDINGS: No acute fracture, subluxation or dislocation. Prior left hip replacement. No hardware complicating feature. IMPRESSION: No acute bony abnormality. Electronically Signed   By: Charlett Nose M.D.   On: 01/20/2021 22:18       Subjective: Patient is feeling better, no nausea or vomiting, no dyspnea or chest pain. Continue to be very weak and deconditioned.   Discharge Exam: Vitals:   01/25/21 0650 01/25/21 0809  BP: (!) 154/90   Pulse: 86   Resp: 18   Temp: 97.6 F (36.4 C)   SpO2: 97% 98%   Vitals:   01/24/21 2141 01/25/21 0521 01/25/21 0650 01/25/21 0809  BP:  (!) 157/72 (!) 154/90  Pulse:  68 86   Resp:  20 18   Temp:  97.7 F (36.5 C) 97.6 F (36.4 C)   TempSrc:  Oral Oral   SpO2: 97% 98% 97% 98%  Weight:      Height:        General: Not in pain or dyspnea  Neurology: Awake and alert, non focal  E ENT: mild pallor, no icterus, oral mucosa moist Cardiovascular: No JVD. S1-S2 present, rhythmic, no gallops, rubs, or murmurs. No lower extremity edema. Pulmonary: positive breath sounds bilaterally, adequate air movement, no wheezing, rhonchi or rales. Gastrointestinal. Abdomen soft and non tender Skin. No rashes Musculoskeletal: no joint deformities   The results of significant diagnostics from this hospitalization (including imaging, microbiology, ancillary and laboratory) are listed below for reference.     Microbiology: Recent Results (from the past 240 hour(s))  Urine Culture     Status: Abnormal   Collection Time: 01/20/21 10:25 PM   Specimen: Urine, Clean Catch  Result Value Ref Range Status   Specimen Description   Final     URINE, CLEAN CATCH Performed at [facility], [address] [place], [place] [address]    Special Requests   Final    NONE Performed at [facility], [address], [place], [place] [address]    Culture (A)  Final    <10,000 COLONIES/mL INSIGNIFICANT GROWTH Performed at [facility], [address], [place], [place] [address]    Report Status 01/22/2021 FINAL  Final  Resp Panel by RT-PCR (Flu A&B, Covid) Urine, In & Out Cath     Status: None   Collection Time: 01/20/21 10:27 PM   Specimen: Urine, In & Out Cath; Nasopharyngeal(NP) swabs in vial transport medium  Result Value Ref Range Status   SARS Coronavirus 2 by RT PCR NEGATIVE NEGATIVE Final    Comment: (NOTE) SARS-CoV-2 target nucleic acids are NOT DETECTED.  The SARS-CoV-2 RNA is generally detectable in upper respiratory specimens during the acute phase of infection. The lowest concentration of SARS-CoV-2 viral copies this assay can detect is 138 copies/mL. A negative result does not preclude SARS-Cov-2 infection and should not be used as the sole basis for treatment or other patient management decisions. A negative result may occur with  improper specimen collection/handling, submission of specimen other than nasopharyngeal swab, presence of viral mutation(s) within the areas targeted by this assay, and inadequate number of viral copies(<138 copies/mL). A negative result must be combined with clinical observations, patient history, and epidemiological information. The expected result is Negative.  Fact Sheet for Patients:  BloggerCourse.com  Fact Sheet for Healthcare Providers:  SeriousBroker.it  This test is no t yet approved or cleared by the [place] FDA and  has been authorized for detection and/or diagnosis of SARS-CoV-2 by FDA under an Emergency Use Authorization (EUA). This EUA will remain  in effect (meaning  this test can be used) for the duration of the COVID-19 declaration under Section 564(b)(1) of the Act, 21 U.S.C.section 360bbb-3(b)(1), unless the authorization is terminated  or revoked sooner.       Influenza A by PCR NEGATIVE NEGATIVE Final   Influenza B by PCR NEGATIVE NEGATIVE Final    Comment: (NOTE) The Xpert Xpress SARS-CoV-2/FLU/RSV plus assay is intended as an aid in the diagnosis of influenza from Nasopharyngeal swab specimens and should not be used as a sole basis for treatment. Nasal washings and aspirates are unacceptable for Xpert Xpress SARS-CoV-2/FLU/RSV testing.  Fact Sheet for  Patients: BloggerCourse.com  Fact Sheet for Healthcare Providers: SeriousBroker.it  This test is not yet approved or cleared by the [place] FDA and has been authorized for detection and/or diagnosis of SARS-CoV-2 by FDA under an Emergency Use Authorization (EUA). This EUA will remain in effect (meaning this test can be used) for the duration of the COVID-19 declaration under Section 564(b)(1) of the Act, 21 U.S.C. section 360bbb-3(b)(1), unless the authorization is terminated or revoked.  Performed at [facility], [address] [place], [place] [address]   SARS CORONAVIRUS 2 (TAT 6-24 HRS) Nasopharyngeal Nasopharyngeal Swab     Status: None   Collection Time: 01/24/21  2:22 PM   Specimen: Nasopharyngeal Swab  Result Value Ref Range Status   SARS Coronavirus 2 NEGATIVE NEGATIVE Final    Comment: (NOTE) SARS-CoV-2 target nucleic acids are NOT DETECTED.  The SARS-CoV-2 RNA is generally detectable in upper and lower respiratory specimens during the acute phase of infection. Negative results do not preclude SARS-CoV-2 infection, do not rule out co-infections with other pathogens, and should not be used as the sole basis for treatment or other patient management decisions. Negative results must be combined  with clinical observations, patient history, and epidemiological information. The expected result is Negative.  Fact Sheet for Patients: HairSlick.no  Fact Sheet for Healthcare Providers: quierodirigir.com  This test is not yet approved or cleared by the [place] FDA and  has been authorized for detection and/or diagnosis of SARS-CoV-2 by FDA under an Emergency Use Authorization (EUA). This EUA will remain  in effect (meaning this test can be used) for the duration of the COVID-19 declaration under Se ction 564(b)(1) of the Act, 21 U.S.C. section 360bbb-3(b)(1), unless the authorization is terminated or revoked sooner.  Performed at [facility], [address], [place], [place] [address]      Labs: BNP (last 3 results) No results for input(s): BNP in the last 8760 hours. Basic Metabolic Panel: Recent Labs  Lab 01/20/21 2123 01/21/21 0641 01/22/21 0936 01/23/21 0440  NA 136 138 139 138  K 3.4* 3.2* 3.7 4.0  CL 103 104 108 110  CO2 24 26 24 24   GLUCOSE 161* 122* 102* 107*  BUN 36* 32* 27* 25*  CREATININE 0.81 0.80 0.82 0.71  CALCIUM 7.8* 7.7* 7.4* 7.4*  MG  --  1.8 1.7 2.1   Liver Function Tests: Recent Labs  Lab 01/20/21 2123 01/21/21 0641  AST 11* 13*  ALT 5 9  ALKPHOS 66 66  BILITOT 0.6 0.8  PROT 5.2* 5.1*  ALBUMIN 2.6* 2.2*   No results for input(s): LIPASE, AMYLASE in the last 168 hours. Recent Labs  Lab 01/20/21 2227  AMMONIA 26   CBC: Recent Labs  Lab 01/20/21 2123 01/21/21 0641 01/22/21 0936 01/23/21 0440  WBC 7.7 7.0 9.1 6.0  NEUTROABS 3.8 4.0 4.5 3.1  HGB 10.6* 11.6* 12.3* 9.9*  HCT 31.8* 34.2* 37.1* 31.0*  MCV 86.4 88.6 87.9 90.4  PLT 266 231 301 217   Cardiac Enzymes: Recent Labs  Lab 01/20/21 2123  CKTOTAL 27*   BNP: Invalid input(s): POCBNP CBG: Recent Labs  Lab 01/23/21 1105 01/23/21 1609 01/23/21 2136 01/24/21 0715 01/24/21 1057  GLUCAP 174* 116*  137* 89 191*   D-Dimer No results for input(s): DDIMER in the last 72 hours. Hgb A1c No results for input(s): HGBA1C in the last 72 hours. Lipid Profile No results for input(s): CHOL, HDL, LDLCALC, TRIG, CHOLHDL, LDLDIRECT in the last 72 hours. Thyroid  function studies No results for input(s): TSH, T4TOTAL, T3FREE, THYROIDAB in the last 72 hours.  Invalid input(s): FREET3 Anemia work up No results for input(s): VITAMINB12, FOLATE, FERRITIN, TIBC, IRON, RETICCTPCT in the last 72 hours. Urinalysis    Component Value Date/Time   COLORURINE YELLOW 01/20/2021 2225   APPEARANCEUR CLEAR 01/20/2021 2225   LABSPEC 1.026 01/20/2021 2225   PHURINE 6.0 01/20/2021 2225   GLUCOSEU NEGATIVE 01/20/2021 2225   HGBUR NEGATIVE 01/20/2021 2225   BILIRUBINUR NEGATIVE 01/20/2021 2225   KETONESUR NEGATIVE 01/20/2021 2225   PROTEINUR 30 (A) 01/20/2021 2225   NITRITE NEGATIVE 01/20/2021 2225   LEUKOCYTESUR NEGATIVE 01/20/2021 2225   Sepsis Labs Invalid input(s): PROCALCITONIN,  WBC,  LACTICIDVEN Microbiology Recent Results (from the past 240 hour(s))  Urine Culture     Status: Abnormal   Collection Time: 01/20/21 10:25 PM   Specimen: Urine, Clean Catch  Result Value Ref Range Status   Specimen Description   Final    URINE, CLEAN CATCH Performed at [facility], [address] [place], [place] [address]    Special Requests   Final    NONE Performed at [facility], [address] [place], [place] [address]    Culture (A)  Final    <10,000 COLONIES/mL INSIGNIFICANT GROWTH Performed at [facility], [address], [place], [place] [address]    Report Status 01/22/2021 FINAL  Final  Resp Panel by RT-PCR (Flu A&B, Covid) Urine, In & Out Cath     Status: None   Collection Time: 01/20/21 10:27 PM   Specimen: Urine, In & Out Cath; Nasopharyngeal(NP) swabs in vial transport medium  Result Value Ref Range Status   SARS Coronavirus 2 by RT PCR  NEGATIVE NEGATIVE Final    Comment: (NOTE) SARS-CoV-2 target nucleic acids are NOT DETECTED.  The SARS-CoV-2 RNA is generally detectable in upper respiratory specimens during the acute phase of infection. The lowest concentration of SARS-CoV-2 viral copies this assay can detect is 138 copies/mL. A negative result does not preclude SARS-Cov-2 infection and should not be used as the sole basis for treatment or other patient management decisions. A negative result may occur with  improper specimen collection/handling, submission of specimen other than nasopharyngeal swab, presence of viral mutation(s) within the areas targeted by this assay, and inadequate number of viral copies(<138 copies/mL). A negative result must be combined with clinical observations, patient history, and epidemiological information. The expected result is Negative.  Fact Sheet for Patients:  BloggerCourse.com  Fact Sheet for Healthcare Providers:  SeriousBroker.it  This test is no t yet approved or cleared by the [place] FDA and  has been authorized for detection and/or diagnosis of SARS-CoV-2 by FDA under an Emergency Use Authorization (EUA). This EUA will remain  in effect (meaning this test can be used) for the duration of the COVID-19 declaration under Section 564(b)(1) of the Act, 21 U.S.C.section 360bbb-3(b)(1), unless the authorization is terminated  or revoked sooner.       Influenza A by PCR NEGATIVE NEGATIVE Final   Influenza B by PCR NEGATIVE NEGATIVE Final    Comment: (NOTE) The Xpert Xpress SARS-CoV-2/FLU/RSV plus assay is intended as an aid in the diagnosis of influenza from Nasopharyngeal swab specimens and should not be used as a sole basis for treatment. Nasal washings and aspirates are unacceptable for Xpert Xpress SARS-CoV-2/FLU/RSV testing.  Fact Sheet for Patients: BloggerCourse.com  Fact Sheet for  Healthcare Providers: SeriousBroker.it  This test is not yet approved or  cleared by the [place] and has been authorized for detection and/or diagnosis of SARS-CoV-2 by FDA under an Emergency Use Authorization (EUA). This EUA will remain in effect (meaning this test can be used) for the duration of the COVID-19 declaration under Section 564(b)(1) of the Act, 21 U.S.C. section 360bbb-3(b)(1), unless the authorization is terminated or revoked.  Performed at [facility], [address], [place], [place] [address]   SARS CORONAVIRUS 2 (TAT 6-24 HRS) Nasopharyngeal Nasopharyngeal Swab     Status: None   Collection Time: 01/24/21  2:22 PM   Specimen: Nasopharyngeal Swab  Result Value Ref Range Status   SARS Coronavirus 2 NEGATIVE NEGATIVE Final    Comment: (NOTE) SARS-CoV-2 target nucleic acids are NOT DETECTED.  The SARS-CoV-2 RNA is generally detectable in upper and lower respiratory specimens during the acute phase of infection. Negative results do not preclude SARS-CoV-2 infection, do not rule out co-infections with other pathogens, and should not be used as the sole basis for treatment or other patient management decisions. Negative results must be combined with clinical observations, patient history, and epidemiological information. The expected result is Negative.  Fact Sheet for Patients: HairSlick.no  Fact Sheet for Healthcare Providers: quierodirigir.com  This test is not yet approved or cleared by the [place] FDA and  has been authorized for detection and/or diagnosis of SARS-CoV-2 by FDA under an Emergency Use Authorization (EUA). This EUA will remain  in effect (meaning this test can be used) for the duration of the COVID-19 declaration under Se ction 564(b)(1) of the Act, 21 U.S.C. section 360bbb-3(b)(1), unless the authorization is terminated or revoked  sooner.  Performed at [facility], [address], [place], [place] [address]      Time coordinating discharge: 45 minutes  SIGNED:   Coralie Keens, MD  [facility] 01/25/2021, 9:59 AM

## 2021-01-25 NOTE — TOC Transition Note (Addendum)
Transition of Care [facility]) - CM/SW Discharge Note   Patient Details  Name: Nathan Alvarez MRN: [id number] Date of Birth: [birth year]  [facility] CM/SW Contact:  Darleene Cleaver, LCSW Phone Number: 01/25/2021, 10:30 AM   Clinical Narrative:     Patient to be d/c'ed today to [facility] room 306A.  Patient and family agreeable to plans will transport via ems RN to call report [phone].  Patient's daughter requested to be notified once patient has been picked up by EMS.  CSW informed bedside nurse.    Final next level of care: Skilled Nursing Facility Barriers to Discharge: Barriers Resolved   Patient Goals and CMS Choice Patient states their goals for this hospitalization and ongoing recovery are:: To go to rehab, then return back home. CMS Medicare.gov Compare Post Acute Care list provided to:: Patient Represenative (must comment) Choice offered to / list presented to : Adult Children  Discharge Placement PASRR number recieved: 01/23/21            Patient chooses bed at: [facility] Patient to be transferred to facility by: [facility] Name of family member notified: Daughter Nathan Alvarez Patient and family notified of of transfer: 01/25/21  Discharge Plan and Services   Discharge Planning Services: CM Consult Post Acute Care Choice:  (TBD)                               Social Determinants of Health (SDOH) Interventions     Readmission Risk Interventions No flowsheet data found.

## 2021-01-25 NOTE — Plan of Care (Signed)
  Problem: Health Behavior/Discharge Planning: Goal: Ability to manage health-related needs will improve Outcome: Progressing   

## 2021-01-25 NOTE — Progress Notes (Addendum)
Attempted to call report at 1:45 pm. Called [phone], call transferred but mailbox is full. [place]

## 2021-02-22 ENCOUNTER — Telehealth: Payer: Self-pay

## 2021-02-22 NOTE — Telephone Encounter (Signed)
I spoke with Nathan Alvarez and ordered the patient a new monitor. I let her know that it will take 7-10 business days.

## 2021-03-03 ENCOUNTER — Emergency Department (HOSPITAL_BASED_OUTPATIENT_CLINIC_OR_DEPARTMENT_OTHER)
Admission: EM | Admit: 2021-03-03 | Discharge: 2021-03-03 | Disposition: A | Discharge status: Home / Self Care | Payer: Medicare Other | Attending: Emergency Medicine | Admitting: Emergency Medicine

## 2021-03-03 ENCOUNTER — Encounter (HOSPITAL_BASED_OUTPATIENT_CLINIC_OR_DEPARTMENT_OTHER): Payer: Self-pay | Admitting: Emergency Medicine

## 2021-03-03 ENCOUNTER — Emergency Department (HOSPITAL_BASED_OUTPATIENT_CLINIC_OR_DEPARTMENT_OTHER): Payer: Medicare Other

## 2021-03-03 DIAGNOSIS — I10 Essential (primary) hypertension: Secondary | ICD-10-CM | POA: Diagnosis not present

## 2021-03-03 DIAGNOSIS — Z79899 Other long term (current) drug therapy: Secondary | ICD-10-CM | POA: Diagnosis not present

## 2021-03-03 DIAGNOSIS — Z7984 Long term (current) use of oral hypoglycemic drugs: Secondary | ICD-10-CM | POA: Diagnosis not present

## 2021-03-03 DIAGNOSIS — Z87891 Personal history of nicotine dependence: Secondary | ICD-10-CM | POA: Insufficient documentation

## 2021-03-03 DIAGNOSIS — Z950 Presence of cardiac pacemaker: Secondary | ICD-10-CM | POA: Insufficient documentation

## 2021-03-03 DIAGNOSIS — Z7982 Long term (current) use of aspirin: Secondary | ICD-10-CM | POA: Diagnosis not present

## 2021-03-03 DIAGNOSIS — Z96642 Presence of left artificial hip joint: Secondary | ICD-10-CM | POA: Diagnosis not present

## 2021-03-03 DIAGNOSIS — Z20822 Contact with and (suspected) exposure to covid-19: Secondary | ICD-10-CM | POA: Insufficient documentation

## 2021-03-03 DIAGNOSIS — R0602 Shortness of breath: Secondary | ICD-10-CM | POA: Insufficient documentation

## 2021-03-03 DIAGNOSIS — E119 Type 2 diabetes mellitus without complications: Secondary | ICD-10-CM | POA: Insufficient documentation

## 2021-03-03 LAB — CBC WITH DIFFERENTIAL/PLATELET
Abs Immature Granulocytes: 0.03 10*3/uL (ref 0.00–0.07)
Basophils Absolute: 0 10*3/uL (ref 0.0–0.1)
Basophils Relative: 1 %
Eosinophils Absolute: 0.1 10*3/uL (ref 0.0–0.5)
Eosinophils Relative: 1 %
HCT: 37.5 % — ABNORMAL LOW (ref 39.0–52.0)
Hemoglobin: 11.9 g/dL — ABNORMAL LOW (ref 13.0–17.0)
Immature Granulocytes: 0 %
Lymphocytes Relative: 24 %
Lymphs Abs: 1.7 10*3/uL (ref 0.7–4.0)
MCH: 30.4 pg (ref 26.0–34.0)
MCHC: 31.7 g/dL (ref 30.0–36.0)
MCV: 95.9 fL (ref 80.0–100.0)
Monocytes Absolute: 0.5 10*3/uL (ref 0.1–1.0)
Monocytes Relative: 7 %
Neutro Abs: 4.9 10*3/uL (ref 1.7–7.7)
Neutrophils Relative %: 67 %
Platelets: 251 10*3/uL (ref 150–400)
RBC: 3.91 MIL/uL — ABNORMAL LOW (ref 4.22–5.81)
RDW: 18.6 % — ABNORMAL HIGH (ref 11.5–15.5)
WBC: 7.2 10*3/uL (ref 4.0–10.5)
nRBC: 0 % (ref 0.0–0.2)

## 2021-03-03 LAB — BRAIN NATRIURETIC PEPTIDE: B Natriuretic Peptide: 2080.6 pg/mL — ABNORMAL HIGH (ref 0.0–100.0)

## 2021-03-03 LAB — BASIC METABOLIC PANEL
Anion gap: 10 (ref 5–15)
BUN: 22 mg/dL (ref 8–23)
CO2: 28 mmol/L (ref 22–32)
Calcium: 9.4 mg/dL (ref 8.9–10.3)
Chloride: 100 mmol/L (ref 98–111)
Creatinine, Ser: 0.75 mg/dL (ref 0.61–1.24)
GFR, Estimated: >60 mL/min (ref >60)
Glucose, Bld: 226 mg/dL — ABNORMAL HIGH (ref 70–99)
Potassium: 3.9 mmol/L (ref 3.5–5.1)
Sodium: 138 mmol/L (ref 135–145)

## 2021-03-03 LAB — TROPONIN I (HIGH SENSITIVITY): Troponin I (High Sensitivity): 40 ng/L — ABNORMAL HIGH (ref <18)

## 2021-03-03 LAB — RESP PANEL BY RT-PCR (FLU A&B, COVID) ARPGX2
Influenza A by PCR: NEGATIVE
Influenza B by PCR: NEGATIVE
SARS Coronavirus 2 by RT PCR: NEGATIVE

## 2021-03-03 MED ORDER — ALBUTEROL SULFATE (2.5 MG/3ML) 0.083% IN NEBU: 2.5000 mg | INHALATION_SOLUTION | Freq: Once | RESPIRATORY_TRACT | Status: DC

## 2021-03-03 MED ORDER — IPRATROPIUM-ALBUTEROL 0.5-2.5 (3) MG/3ML IN SOLN
RESPIRATORY_TRACT | Status: AC
  Administered 2021-03-03: 10:00:00 3 mL
  Filled 2021-03-03: qty 3

## 2021-03-03 MED ORDER — FUROSEMIDE 20 MG PO TABS: 10.0000 mg | ORAL_TABLET | Freq: Every day | ORAL | 0 refills | Status: DC

## 2021-03-03 MED ORDER — FUROSEMIDE 40 MG PO TABS
40.0000 mg | ORAL_TABLET | Freq: Once | ORAL | Status: AC
  Administered 2021-03-03: 12:00:00 40 mg via ORAL
  Filled 2021-03-03: qty 1

## 2021-03-03 NOTE — ED Notes (Signed)
Spoke with Hilda Lias, pt's DIL.  Pt's son Alvon is on his way to pick up pt now.

## 2021-03-03 NOTE — Discharge Instructions (Signed)
Your chest x-ray showed that you could have a pneumonia though without coughing or having fevers I think this is less likely.  The other thing found on your blood work was there is some concern that you may have a component of heart failure though that is not also obvious with you not having any significant leg swelling.  We will try a short course of a medicine that will make you pee more to try and get some of that fluid out.  If that makes you better than that is probably what this was.  You need to discuss this with your family doctor and they may decide to repeat an ultrasound of your heart.  Please return for worsening or persistent difficulty breathing chest pain cough or fever.

## 2021-03-03 NOTE — ED Provider Notes (Signed)
[facility] EMERGENCY DEPT Provider Note   CSN: [id number] Arrival date & time: 03/03/21  [id number]     History Chief Complaint  Patient presents with   Shortness of Breath    Nathan Alvarez is a 87 y.o. male.  87 yo M with a chief complaints of shortness of breath.  This is been off and on for the past few days.  Seems occur spontaneously usually at rest.  He tells me that he breathes forcefully and it improves.  He denies cough congestion or fever.  Denies chest pain or pressure.  Denies abdominal pain nausea vomiting or diarrhea.  His son is at bedside and tells me that he thinks it may be due to anxiety.  States he was on medication for anxiety but was having some decreased mentation and so was taken off the medication recently.       Past Medical History:  Diagnosis Date   A-fib ([place])    Dizziness    DM (diabetes mellitus) (HCC)    Esophagitis, reflux    Heart block    Hyperlipidemia    Hypertension, essential, benign    Memory deficit    Pathological fracture of left hip due to age-related osteoporosis ([place]) 04/25/2019   SOB (shortness of breath)    SSS (sick sinus syndrome) [facility])     Patient Active Problem List   Diagnosis Date Noted   Weakness generalized    CAP (community acquired pneumonia) 01/21/2021   Pressure injury of skin 01/21/2021   Fall    Aspiration pneumonia ([place]) 12/15/2020   Sepsis due to aspiration pneumonia and enterocolitis 12/15/2020   AKI (acute kidney injury) ([place]) 12/14/2020   Enterocolitis-C diff test is Equivocal  12/14/2020   Sinus node dysfunction ([place]) 03/06/2020   Pacemaker 03/06/2020   Vitamin D deficiency 04/25/2019   Pathological fracture of left hip due to age-related osteoporosis ([place]) 04/25/2019   Femoral neck fracture ([place]) 04/21/2019   Hypertension 04/21/2019   Diabetes ([place]) 04/21/2019   Hyponatremia 04/21/2019   Anemia 04/21/2019    Past Surgical History:  Procedure Laterality Date   CATARACT  EXTRACTION Right    HIP ARTHROPLASTY Left 04/22/2019   Procedure: ARTHROPLASTY BIPOLAR HIP (HEMIARTHROPLASTY);  Surgeon: Altamese Mesquite, MD;  Location: [facility];  Service: Orthopedics;  Laterality: Left;   INGUINAL HERNIA REPAIR     INSERT / REPLACE / REMOVE PACEMAKER     PACEMAKER INSERTION     TONSILLECTOMY AND ADENOIDECTOMY         Family History  Problem Relation Age of Onset   Heart disease Mother     Social History   Tobacco Use   Smoking status: Former    Types: Cigarettes    Quit date: 01/01/1956    Years since quitting: 65.2    Passive exposure: Never   Smokeless tobacco: Never  Vaping Use   Vaping Use: Never used  Substance Use Topics   Alcohol use: No   Drug use: No    Home Medications Prior to Admission medications   Medication Sig Start Date End Date Taking? Authorizing Provider  furosemide (LASIX) 20 MG tablet Take 0.5 tablets (10 mg total) by mouth daily for 4 days. 03/03/21 03/07/21 Yes Deno Etienne, DO  acetaminophen (TYLENOL) 325 MG tablet Take 2 tablets (650 mg total) by mouth every 6 (six) hours as needed for mild pain (or Fever >/= 101). 12/16/20   Roxan Hockey, MD  aspirin EC 81 MG EC tablet Take 1 tablet (81 mg total)  by mouth daily with breakfast. Swallow whole. 12/16/20   Roxan Hockey, MD  calcium carbonate (OS-CAL - DOSED IN MG OF ELEMENTAL CALCIUM) 1250 (500 Ca) MG tablet Take 1 tablet (500 mg of elemental calcium total) by mouth 2 (two) times daily with a meal. 12/16/20   Emokpae, Courage, MD  carvedilol (COREG) 6.25 MG tablet Take 1 tablet (6.25 mg total) by mouth 2 (two) times daily. For BP and Heart 12/16/20   Roxan Hockey, MD  Ferrous Sulfate (IRON) 325 (65 Fe) MG TABS Take 28 mg by mouth daily.    [provider]  JANUVIA 50 MG tablet Take 1 tablet (50 mg total) by mouth daily. 12/16/20   Roxan Hockey, MD  mirtazapine (REMERON) 7.5 MG tablet Take 1 tablet (7.5 mg total) by mouth at bedtime. For appetite stimulation and mood  12/16/20   Roxan Hockey, MD    Allergies    Penicillins  Review of Systems   Review of Systems  Constitutional:  Negative for chills and fever.  HENT:  Negative for congestion and facial swelling.   Eyes:  Negative for discharge and visual disturbance.  Respiratory:  Positive for shortness of breath.   Cardiovascular:  Negative for chest pain and palpitations.  Gastrointestinal:  Negative for abdominal pain, diarrhea and vomiting.  Musculoskeletal:  Negative for arthralgias and myalgias.  Skin:  Negative for color change and rash.  Neurological:  Negative for tremors, syncope and headaches.  Psychiatric/Behavioral:  Negative for confusion and dysphoric mood.    Physical Exam Updated Vital Signs BP 136/82 (BP Location: Left Arm)   Pulse 70   Temp (!) 97.5 F (36.4 C) (Oral)   Resp (!) 23   SpO2 98%   Physical Exam Vitals and nursing note reviewed.  Constitutional:      Appearance: He is well-developed.  HENT:     Head: Normocephalic and atraumatic.  Eyes:     Pupils: Pupils are equal, round, and reactive to light.  Neck:     Vascular: No JVD.  Cardiovascular:     Rate and Rhythm: Normal rate and regular rhythm.     Heart sounds: No murmur heard.   No friction rub. No gallop.  Pulmonary:     Effort: No respiratory distress.     Breath sounds: No wheezing.  Abdominal:     General: There is no distension.     Tenderness: There is no abdominal tenderness. There is no guarding or rebound.  Musculoskeletal:        General: Normal range of motion.     Cervical back: Normal range of motion and neck supple.  Skin:    Coloration: Skin is not pale.     Findings: No rash.  Neurological:     Mental Status: He is alert and oriented to person, place, and time.  Psychiatric:        Behavior: Behavior normal.    ED Results / Procedures / Treatments   Labs (all labs ordered are listed, but only abnormal results are displayed) Labs Reviewed  CBC WITH  DIFFERENTIAL/PLATELET - Abnormal; Notable for the following components:      Result Value   RBC 3.91 (*)    Hemoglobin 11.9 (*)    HCT 37.5 (*)    RDW 18.6 (*)    All other components within normal limits  BASIC METABOLIC PANEL - Abnormal; Notable for the following components:   Glucose, Bld 226 (*)    All other components within normal limits  BRAIN  NATRIURETIC PEPTIDE - Abnormal; Notable for the following components:   B Natriuretic Peptide 2,080.6 (*)    All other components within normal limits  TROPONIN I (HIGH SENSITIVITY) - Abnormal; Notable for the following components:   Troponin I (High Sensitivity) 40 (*)    All other components within normal limits  RESP PANEL BY RT-PCR (FLU A&B, COVID) [id number]    EKG EKG Interpretation  Date/Time:  Sunday March 03 2021 09:42:51 EST Ventricular Rate:  66 PR Interval:    QRS Duration: 140 QT Interval:  498 QTC Calculation: 522 R Axis:   -87 Text Interpretation: Ventricular-paced rhythm Abnormal ECG No significant change since last tracing Confirmed by Jackie Russman ([id number]) on 03/03/2021 10:14:22 AM  Radiology DG Chest Port 1 View  Result Date: 03/03/2021 CLINICAL DATA:  87 year old male with history of shortness of breath for the past 2 days. EXAM: PORTABLE CHEST 1 VIEW COMPARISON:  Chest x-ray 01/21/2021. FINDINGS: Lung volumes are low. Opacity in the medial aspect of the left lung base which may reflect areas of atelectasis and/or consolidation. No definite pleural effusions. No pneumothorax. No evidence of pulmonary edema. Heart size is normal. Upper mediastinal contours are within normal limits. Atherosclerotic calcifications in the thoracic aorta. Left-sided pacemaker device in place with lead tips projecting over the expected location of the right atrium and right ventricle. IMPRESSION: 1. Low lung volumes with atelectasis and/or consolidation in the medial aspect of the left lower lobe. 2. Aortic atherosclerosis. Electronically  Signed   By: Daniel  Entrikin M.D.   On: 03/03/2021 11:13    Procedures Procedures   Medications Ordered in ED Medications  albuterol (PROVENTIL) (2.5 MG/3ML) 0.083% nebulizer solution 2.5 mg (has no administration in time range)  ipratropium-albuterol (DUONEB) 0.5-2.5 (3) MG/3ML nebulizer solution (3 mLs  Given 03/03/21 0951)  furosemide (LASIX) tablet 40 mg (40 mg Oral Given 03/03/21 1147)    ED Course  I have reviewed the triage vital signs and the nursing notes.  Pertinent labs & imaging results that were available during my care of the patient were reviewed by me and considered in my medical decision making (see chart for details).    MDM Rules/Calculators/A&P                           87  yo M with a chief complaints of shortness of breath.  This is been coming and going.  Not sure what brings it on but seems improved with slow deep breathing.  Son thinks it is related to a recent medication that was removed from his regiment that was for anxiety.  We will obtain a laboratory evaluation chest x-ray EKG reassess. Patient's chest x-ray viewed by me without obvious focal infiltrate read by radiology is possible atelectasis versus infiltrate.  BNP is somewhat elevated.  Patient has no anemia no significant electrolyte abnormality.  The patient is not obviously fluid overloaded on exam.  I will start on a short course of Lasix to see if that improves his symptoms.  I think less likely the patient has pneumonia without cough or fever.  We will have him follow-up with his family doctor in the office.  12:52 PM:  I have discussed the diagnosis/risks/treatment options with the patient and believe the pt to be eligible for discharge home to follow-up with PCP. We also discussed returning to the ED immediately if new or worsening sx occur. We discussed the sx which are most concerning (e.g., sudden worsening pain,  fever, inability to tolerate by mouth) that necessitate immediate return.  Medications administered to the patient during their visit and any new prescriptions provided to the patient are listed below.  Medications given during this visit Medications  albuterol (PROVENTIL) (2.5 MG/3ML) 0.083% nebulizer solution 2.5 mg (has no administration in time range)  ipratropium-albuterol (DUONEB) 0.5-2.5 (3) MG/3ML nebulizer solution (3 mLs  Given 03/03/21 0951)  furosemide (LASIX) tablet 40 mg (40 mg Oral Given 03/03/21 1147)     The patient appears reasonably screen and/or stabilized for discharge and I doubt any other medical condition or other [facility] requiring further screening, evaluation, or treatment in the ED at this time prior to discharge.   Final Clinical Impression(s) / ED Diagnoses Final diagnoses:  Shortness of breath    Rx / DC Orders ED Discharge Orders          Ordered    furosemide (LASIX) 20 MG tablet  Daily        03/03/21 1133             Melene Plan, DO 03/03/21 1252

## 2021-03-03 NOTE — ED Triage Notes (Signed)
Per son, pt has been SOB since last night and has recently moved into a new facility.  Son states pt may be having issues with anxiety from new environment.

## 2021-03-05 ENCOUNTER — Ambulatory Visit (INDEPENDENT_AMBULATORY_CARE_PROVIDER_SITE_OTHER): Payer: Medicare Other

## 2021-03-05 DIAGNOSIS — I495 Sick sinus syndrome: Secondary | ICD-10-CM | POA: Diagnosis not present

## 2021-03-05 LAB — CUP PACEART REMOTE DEVICE CHECK
Battery Impedance: 18801 Ohm
Battery Voltage: 2.38 V
Brady Statistic RV Percent Paced: 4 %
Date Time Interrogation Session: 20221129131044
Implantable Lead Implant Date: 20110513
Implantable Lead Implant Date: 20110513
Implantable Lead Location: 753859
Implantable Lead Location: 753860
Implantable Lead Model: 5092
Implantable Lead Model: 5594
Implantable Pulse Generator Implant Date: 20110513
Lead Channel Impedance Value: 566 Ohm
Lead Channel Impedance Value: 67 Ohm
Lead Channel Setting Pacing Amplitude: 2 V
Lead Channel Setting Pacing Pulse Width: 0.4 ms
Lead Channel Setting Sensing Sensitivity: 2.8 mV

## 2021-03-06 ENCOUNTER — Telehealth: Payer: Self-pay

## 2021-03-06 NOTE — Telephone Encounter (Signed)
Scheduled remote reviewed. Normal device function.  RRT/ERI triggered on 08/13/20 with last device check April prior. Battery voltage 2.38V. Currently at VVI/65bpm. Presenting rhythm showing markers only (no EGM), Vs/Vp.  V pacing 3.9%. Routing high priority.    Spoke to Dr. Ladona Ridgel and verbal orders obtained to add patient onto Otilio Saber, PA schedule next week then have Mardelle Matte add patient onto Dr. Lubertha Basque schedule the following week of 03/18/21 for gen change.   Called and spoke to patients daughter Nathan Alvarez who is on [place]), advised device reached RRT 08/13/20. Stressed importance to make apt. On 03/14/21 @ 11:20 and plan of care per Dr. Ladona Ridgel. Location, date and time discussed with verbal understanding. Advised to call with further questions, concerns, or schedule change.

## 2021-03-13 NOTE — Progress Notes (Signed)
Electrophysiology Office Note Date: 03/14/2021  ID:  Nathan Alvarez, DOB 05/30/1924, MRN [id number]  PCP: Curly Rim, MD Primary Cardiologist: Cristopher Peru, MD Electrophysiologist: Cristopher Peru, MD   CC: Pacemaker follow-up  Nathan Alvarez is a 87 y.o. male seen today for Cristopher Peru, MD for routine electrophysiology followup.  Since last being seen in our clinic the patient reports doing well. He had misplaced his monitor and we were only recently notified that ERI occurred 08/2020. Device NOT checked today with limited battery per MDT recommendation, previously, leads were stable.  he denies chest pain, palpitations, dyspnea, PND, orthopnea, nausea, vomiting, dizziness, syncope, edema, weight gain, or early satiety.  Device History: Medtronic Dual Chamber PPM implanted 2011 for SNF  Past Medical History:  Diagnosis Date   A-fib ([place])    Dizziness    DM (diabetes mellitus) ([place])    Esophagitis, reflux    Heart block    Hyperlipidemia    Hypertension, essential, benign    Memory deficit    Pathological fracture of left hip due to age-related osteoporosis ([place]) 04/25/2019   SOB (shortness of breath)    SSS (sick sinus syndrome) ([place])    Past Surgical History:  Procedure Laterality Date   CATARACT EXTRACTION Right    HIP ARTHROPLASTY Left 04/22/2019   Procedure: ARTHROPLASTY BIPOLAR HIP (HEMIARTHROPLASTY);  Surgeon: Altamese Coffey, MD;  Location: [facility];  Service: Orthopedics;  Laterality: Left;   INGUINAL HERNIA REPAIR     INSERT / REPLACE / REMOVE PACEMAKER     PACEMAKER INSERTION     TONSILLECTOMY AND ADENOIDECTOMY      Current Outpatient Medications  Medication Sig Dispense Refill   acetaminophen (TYLENOL) 325 MG tablet Take 2 tablets (650 mg total) by mouth every 6 (six) hours as needed for mild pain (or Fever >/= 101). 12 tablet 1   aspirin EC 81 MG EC tablet Take 1 tablet (81 mg total) by mouth daily with breakfast. Swallow whole. 30 tablet 11   calcium  carbonate (OS-CAL - DOSED IN MG OF ELEMENTAL CALCIUM) 1250 (500 Ca) MG tablet Take 1 tablet (500 mg of elemental calcium total) by mouth 2 (two) times daily with a meal. 60 tablet 1   carvedilol (COREG) 6.25 MG tablet Take 1 tablet (6.25 mg total) by mouth 2 (two) times daily. For BP and Heart 180 tablet 3   Ferrous Sulfate (IRON) 325 (65 Fe) MG TABS Take 28 mg by mouth daily.     JANUVIA 50 MG tablet Take 1 tablet (50 mg total) by mouth daily. 30 tablet 1   PARoxetine (PAXIL) 10 MG tablet Take by mouth daily.     furosemide (LASIX) 20 MG tablet Take 0.5 tablets (10 mg total) by mouth daily for 4 days. 2 tablet 0   mirtazapine (REMERON) 7.5 MG tablet Take 1 tablet (7.5 mg total) by mouth at bedtime. For appetite stimulation and mood (Patient not taking: Reported on 03/14/2021) 30 tablet 4   No current facility-administered medications for this visit.    Allergies:   Penicillins   Social History: Social History   Socioeconomic History   Marital status: Married    Spouse name: Not on file   Number of children: Not on file   Years of education: Not on file   Highest education level: Not on file  Occupational History   Not on file  Tobacco Use   Smoking status: Former    Types: Cigarettes    Quit date: 01/01/1956  Years since quitting: 65.2    Passive exposure: Never   Smokeless tobacco: Never  Vaping Use   Vaping Use: Never used  Substance and Sexual Activity   Alcohol use: No   Drug use: No   Sexual activity: Not Currently    Comment: MARRIED  Other Topics Concern   Not on file  Social History Narrative   Not on file   Social Determinants of Health   Financial Resource Strain: Not on file  Food Insecurity: Not on file  Transportation Needs: Not on file  Physical Activity: Not on file  Stress: Not on file  Social Connections: Not on file  Intimate Partner Violence: Not on file    Family History: Family History  Problem Relation Age of Onset   Heart disease Mother       Review of Systems: All other systems reviewed and are otherwise negative except as noted above.  Physical Exam: Vitals:   03/14/21 1135  BP: 120/80  Pulse: 65  SpO2: 96%  Weight: 117 lb (53.1 kg)  Height: 5\' 7"  (1.702 m)     GEN- The patient is elderly appearing, alert and oriented x 3 today.   HEENT: normocephalic, atraumatic; sclera clear, conjunctiva pink; hearing intact; oropharynx clear; neck supple  Lungs- Clear to ausculation bilaterally, normal work of breathing.  No wheezes, rales, rhonchi Heart- Regular rate and rhythm, no murmurs, rubs or gallops  GI- soft, non-tender, non-distended, bowel sounds present  Extremities- no clubbing or cyanosis. No edema MS- no significant deformity or atrophy Skin- warm and dry, no rash or lesion; PPM pocket well healed Psych- euthymic mood, full affect Neuro- strength and sensation are intact  PPM Interrogation- reviewed in detail today,  See PACEART report  EKG:  EKG is not ordered today. EKG 03/05/2021 reviewed which shows V paced rhythm at 66 bpm  Recent Labs: 01/21/2021: ALT 9 01/23/2021: Magnesium 2.1 03/03/2021: B Natriuretic Peptide 2,080.6; BUN 22; Creatinine, Ser 0.75; Hemoglobin 11.9; Platelets 251; Potassium 3.9; Sodium 138   Wt Readings from Last 3 Encounters:  03/14/21 117 lb (53.1 kg)  01/23/21 133 lb 6.1 oz (60.5 kg)  12/14/20 143 lb (64.9 kg)     Other studies Reviewed: Additional studies/ records that were reviewed today include: Previous EP office notes, Previous remote checks, Most recent labwork.   Assessment and Plan:  1. SND s/p Medtronic PPM  Device at RRT as of 08/2020 Not dependent chronically Device NOT tested today with only occasional pacing and device at EOS. Do not want to stress battery further.  Scheduled for gen change next week with Dr. 09/2020.  Explained risks, benefits, and alternatives to PPM gen change, including but not limited to bleeding or infection.  Pt and daughter  verbalized understanding and agrees to proceed  2. HTN Stable on current regimen   Current medicines are reviewed at length with the patient today.    Labs/ tests ordered today include:  Orders Placed This Encounter  Procedures   Basic metabolic panel   CBC     Disposition:   Follow up as usual post gen change.   Ladona Ridgel, PA-C  03/14/2021 11:44 AM  [facility] [address] [place] [phone] (office) [phone] (fax)

## 2021-03-14 ENCOUNTER — Encounter: Payer: Self-pay | Admitting: Student

## 2021-03-14 ENCOUNTER — Ambulatory Visit (INDEPENDENT_AMBULATORY_CARE_PROVIDER_SITE_OTHER): Payer: Medicare Other | Admitting: Student

## 2021-03-14 ENCOUNTER — Other Ambulatory Visit: Payer: Self-pay

## 2021-03-14 VITALS — BP 120/80 | HR 65 | Ht 67.0 in | Wt 117.0 lb

## 2021-03-14 DIAGNOSIS — I10 Essential (primary) hypertension: Secondary | ICD-10-CM

## 2021-03-14 DIAGNOSIS — I495 Sick sinus syndrome: Secondary | ICD-10-CM

## 2021-03-14 DIAGNOSIS — Z950 Presence of cardiac pacemaker: Secondary | ICD-10-CM | POA: Diagnosis not present

## 2021-03-14 LAB — CBC
Hematocrit: 35.5 % — ABNORMAL LOW (ref 37.5–51.0)
Hemoglobin: 11.8 g/dL — ABNORMAL LOW (ref 13.0–17.7)
MCH: 30.6 pg (ref 26.6–33.0)
MCHC: 33.2 g/dL (ref 31.5–35.7)
MCV: 92 fL (ref 79–97)
Platelets: 250 10*3/uL (ref 150–450)
RBC: 3.85 x10E6/uL — ABNORMAL LOW (ref 4.14–5.80)
RDW: 15.8 % — ABNORMAL HIGH (ref 11.6–15.4)
WBC: 9.1 10*3/uL (ref 3.4–10.8)

## 2021-03-14 LAB — BASIC METABOLIC PANEL
BUN/Creatinine Ratio: 23 (ref 10–24)
BUN: 21 mg/dL (ref 10–36)
CO2: 30 mmol/L — ABNORMAL HIGH (ref 20–29)
Calcium: 9 mg/dL (ref 8.6–10.2)
Chloride: 99 mmol/L (ref 96–106)
Creatinine, Ser: 0.93 mg/dL (ref 0.76–1.27)
Glucose: 151 mg/dL — ABNORMAL HIGH (ref 70–99)
Potassium: 4 mmol/L (ref 3.5–5.2)
Sodium: 136 mmol/L (ref 134–144)
eGFR: 77 mL/min/{1.73_m2} (ref >59)

## 2021-03-14 NOTE — Patient Instructions (Signed)
Medication Instructions: Your physician recommends that you continue on your current medications as directed. Please refer to the Current Medication list given to you today.  Labwork: Your physician has recommended that you have lab work today: BMET and CBC   Procedures/Testing: Your physician has recommended that you have a Generator Change of your device on 03/18/2021. This is a procedure that replaces a Pacemaker ICD generator that is at the end of its service life. The remaining lifespan of a pacemaker is determined during visits to the Device Clinic. The battery in a pacemaker does not stop suddenly but rather loses its charge slowly, which lets the cardiologist plan the replacement date.  Follow-Up: Your physician recommends that you schedule a follow-up appointment in 10 - 14 days from 03/18/21 with the Device clinic for a wound check  Your physician recommends that you schedule a follow-up appointment in 3 months from 03/18/21 with Dr. Ladona Ridgel.   If you need a refill on your cardiac medications before your next appointment, please call your pharmacy.   -------------------------------------------------------------------------------------------------------------  Please wash with the CHG Soap the night before and morning of procedure (follow instruction page "Preparing For Surgery").   Please report to the Main Entrance [facility] at 12:00 ([address] [place], [place] [address])  DO NOT eat or drink anything after midnight the night before procedure  You may take all of your morning medications the day of your procedure EXCEPT Furosemide with enough water to get them down safely.  You will need someone to drive you home after the procedure  ------------------------------------------------------------------------------------------------------------ [facility] - Preparing For Surgery  Before surgery, you can play an important role.  Because skin is not sterile, your skin needs to be as free of germs as possible. You can reduce the number of germs on your skin by washing with CHG (chlorahexidine gluconate) Soap before surgery.  CHG is an antiseptic cleaner which kills germs and bonds with the skin to continue killing germs even after washing.   Please do not use if you have an allergy to CHG or antibacterial soaps.  If your skin becomes reddened/irritated stop using the CHG.   Do not shave (including legs and underarms) for at least 48 hours prior to first CHG shower.  It is OK to shave your face.  Please follow these instructions carefully:  1.  Shower the night before surgery and the morning of surgery with CHG.  2.  If you choose to wash your hair, wash your hair first as usual with your normal shampoo.  3.  After you shampoo, rinse your hair and body thoroughly to remove the shampoo.  4.  Use CHG as you would any other liquid soap.  You can apply CHG directly to the skin and wash gently with a clean washcloth. 5.  Apply the CHG Soap to your body ONLY FROM THE NECK DOWN.  Do not use on open wounds or open sores.  Avoid contact with your eyes, ears, mouth and genitals (private parts).  Wash genitals (private parts) with your normal soap.  6.  Wash thoroughly, paying special attention to the area where your surgery will be performed.  7.  Thoroughly rinse your body with warm water from the neck down.   8.  DO NOT shower/wash with your normal soap after using and rinsing off the CHG soap.  9.  Pat yourself dry with a clean towel.  10.  Wear clean pajamas.           11.  Place clean sheets on your bed the night of your first shower and do not sleep with pets.  Day of Surgery: Do not apply any deodorants/lotions.  Please wear clean clothes to the hospital/surgery center.

## 2021-03-14 NOTE — Progress Notes (Signed)
Remote pacemaker transmission.   

## 2021-03-17 ENCOUNTER — Inpatient Hospital Stay (HOSPITAL_BASED_OUTPATIENT_CLINIC_OR_DEPARTMENT_OTHER)
Admission: EM | Admit: 2021-03-17 | Discharge: 2021-03-22 | DRG: 871 | Disposition: A | Discharge status: Skilled Nursing Facility | Payer: Medicare Other | Attending: Internal Medicine | Admitting: Internal Medicine

## 2021-03-17 ENCOUNTER — Other Ambulatory Visit: Payer: Self-pay

## 2021-03-17 ENCOUNTER — Encounter (HOSPITAL_BASED_OUTPATIENT_CLINIC_OR_DEPARTMENT_OTHER): Payer: Self-pay

## 2021-03-17 ENCOUNTER — Emergency Department (HOSPITAL_BASED_OUTPATIENT_CLINIC_OR_DEPARTMENT_OTHER): Payer: Medicare Other

## 2021-03-17 DIAGNOSIS — K2100 Gastro-esophageal reflux disease with esophagitis, without bleeding: Secondary | ICD-10-CM | POA: Diagnosis present

## 2021-03-17 DIAGNOSIS — I495 Sick sinus syndrome: Secondary | ICD-10-CM | POA: Diagnosis present

## 2021-03-17 DIAGNOSIS — I700 Atherosclerosis of aorta: Secondary | ICD-10-CM | POA: Diagnosis present

## 2021-03-17 DIAGNOSIS — R6521 Severe sepsis with septic shock: Secondary | ICD-10-CM | POA: Diagnosis present

## 2021-03-17 DIAGNOSIS — I5021 Acute systolic (congestive) heart failure: Secondary | ICD-10-CM | POA: Diagnosis present

## 2021-03-17 DIAGNOSIS — E1165 Type 2 diabetes mellitus with hyperglycemia: Secondary | ICD-10-CM | POA: Diagnosis present

## 2021-03-17 DIAGNOSIS — K219 Gastro-esophageal reflux disease without esophagitis: Secondary | ICD-10-CM | POA: Diagnosis present

## 2021-03-17 DIAGNOSIS — L8990 Pressure ulcer of unspecified site, unspecified stage: Secondary | ICD-10-CM | POA: Diagnosis present

## 2021-03-17 DIAGNOSIS — J9601 Acute respiratory failure with hypoxia: Secondary | ICD-10-CM

## 2021-03-17 DIAGNOSIS — Z20822 Contact with and (suspected) exposure to covid-19: Secondary | ICD-10-CM | POA: Diagnosis present

## 2021-03-17 DIAGNOSIS — Z87891 Personal history of nicotine dependence: Secondary | ICD-10-CM

## 2021-03-17 DIAGNOSIS — A419 Sepsis, unspecified organism: Secondary | ICD-10-CM | POA: Diagnosis present

## 2021-03-17 DIAGNOSIS — L89312 Pressure ulcer of right buttock, stage 2: Secondary | ICD-10-CM | POA: Diagnosis present

## 2021-03-17 DIAGNOSIS — E876 Hypokalemia: Secondary | ICD-10-CM | POA: Diagnosis present

## 2021-03-17 DIAGNOSIS — L89152 Pressure ulcer of sacral region, stage 2: Secondary | ICD-10-CM | POA: Diagnosis present

## 2021-03-17 DIAGNOSIS — I10 Essential (primary) hypertension: Secondary | ICD-10-CM | POA: Diagnosis not present

## 2021-03-17 DIAGNOSIS — I4891 Unspecified atrial fibrillation: Secondary | ICD-10-CM | POA: Diagnosis present

## 2021-03-17 DIAGNOSIS — J101 Influenza due to other identified influenza virus with other respiratory manifestations: Secondary | ICD-10-CM | POA: Diagnosis not present

## 2021-03-17 DIAGNOSIS — R1310 Dysphagia, unspecified: Secondary | ICD-10-CM

## 2021-03-17 DIAGNOSIS — L89322 Pressure ulcer of left buttock, stage 2: Secondary | ICD-10-CM | POA: Diagnosis present

## 2021-03-17 DIAGNOSIS — Z79899 Other long term (current) drug therapy: Secondary | ICD-10-CM

## 2021-03-17 DIAGNOSIS — I110 Hypertensive heart disease with heart failure: Secondary | ICD-10-CM | POA: Diagnosis present

## 2021-03-17 DIAGNOSIS — E872 Acidosis, unspecified: Secondary | ICD-10-CM

## 2021-03-17 DIAGNOSIS — J111 Influenza due to unidentified influenza virus with other respiratory manifestations: Secondary | ICD-10-CM

## 2021-03-17 DIAGNOSIS — D649 Anemia, unspecified: Secondary | ICD-10-CM | POA: Diagnosis present

## 2021-03-17 DIAGNOSIS — R933 Abnormal findings on diagnostic imaging of other parts of digestive tract: Secondary | ICD-10-CM | POA: Diagnosis not present

## 2021-03-17 DIAGNOSIS — Z96642 Presence of left artificial hip joint: Secondary | ICD-10-CM | POA: Diagnosis present

## 2021-03-17 DIAGNOSIS — I429 Cardiomyopathy, unspecified: Secondary | ICD-10-CM | POA: Diagnosis present

## 2021-03-17 DIAGNOSIS — Z950 Presence of cardiac pacemaker: Secondary | ICD-10-CM

## 2021-03-17 DIAGNOSIS — E785 Hyperlipidemia, unspecified: Secondary | ICD-10-CM | POA: Diagnosis present

## 2021-03-17 DIAGNOSIS — F32A Depression, unspecified: Secondary | ICD-10-CM | POA: Diagnosis present

## 2021-03-17 DIAGNOSIS — R1319 Other dysphagia: Secondary | ICD-10-CM | POA: Diagnosis not present

## 2021-03-17 DIAGNOSIS — J1000 Influenza due to other identified influenza virus with unspecified type of pneumonia: Secondary | ICD-10-CM | POA: Diagnosis present

## 2021-03-17 DIAGNOSIS — E1129 Type 2 diabetes mellitus with other diabetic kidney complication: Secondary | ICD-10-CM

## 2021-03-17 DIAGNOSIS — Z7982 Long term (current) use of aspirin: Secondary | ICD-10-CM

## 2021-03-17 DIAGNOSIS — Z66 Do not resuscitate: Secondary | ICD-10-CM | POA: Diagnosis present

## 2021-03-17 DIAGNOSIS — I5031 Acute diastolic (congestive) heart failure: Secondary | ICD-10-CM | POA: Diagnosis not present

## 2021-03-17 DIAGNOSIS — K224 Dyskinesia of esophagus: Secondary | ICD-10-CM | POA: Diagnosis not present

## 2021-03-17 DIAGNOSIS — Z880 Allergy status to penicillin: Secondary | ICD-10-CM | POA: Diagnosis not present

## 2021-03-17 DIAGNOSIS — I959 Hypotension, unspecified: Secondary | ICD-10-CM | POA: Diagnosis present

## 2021-03-17 DIAGNOSIS — Z8249 Family history of ischemic heart disease and other diseases of the circulatory system: Secondary | ICD-10-CM

## 2021-03-17 LAB — COMPREHENSIVE METABOLIC PANEL
ALT: 8 U/L (ref 0–44)
AST: 13 U/L — ABNORMAL LOW (ref 15–41)
Albumin: 3.7 g/dL (ref 3.5–5.0)
Alkaline Phosphatase: 75 U/L (ref 38–126)
Anion gap: 10 (ref 5–15)
BUN: 27 mg/dL — ABNORMAL HIGH (ref 8–23)
CO2: 28 mmol/L (ref 22–32)
Calcium: 9.1 mg/dL (ref 8.9–10.3)
Chloride: 96 mmol/L — ABNORMAL LOW (ref 98–111)
Creatinine, Ser: 0.99 mg/dL (ref 0.61–1.24)
GFR, Estimated: >60 mL/min (ref >60)
Glucose, Bld: 212 mg/dL — ABNORMAL HIGH (ref 70–99)
Potassium: 4.2 mmol/L (ref 3.5–5.1)
Sodium: 134 mmol/L — ABNORMAL LOW (ref 135–145)
Total Bilirubin: 0.5 mg/dL (ref 0.3–1.2)
Total Protein: 6.8 g/dL (ref 6.5–8.1)

## 2021-03-17 LAB — CBC
HCT: 34.9 % — ABNORMAL LOW (ref 39.0–52.0)
Hemoglobin: 11.2 g/dL — ABNORMAL LOW (ref 13.0–17.0)
MCH: 30.9 pg (ref 26.0–34.0)
MCHC: 32.1 g/dL (ref 30.0–36.0)
MCV: 96.4 fL (ref 80.0–100.0)
Platelets: 179 10*3/uL (ref 150–400)
RBC: 3.62 MIL/uL — ABNORMAL LOW (ref 4.22–5.81)
RDW: 15.7 % — ABNORMAL HIGH (ref 11.5–15.5)
WBC: 9.2 10*3/uL (ref 4.0–10.5)
nRBC: 0 % (ref 0.0–0.2)

## 2021-03-17 LAB — CBC WITH DIFFERENTIAL/PLATELET
Abs Immature Granulocytes: 0.09 10*3/uL — ABNORMAL HIGH (ref 0.00–0.07)
Basophils Absolute: 0 10*3/uL (ref 0.0–0.1)
Basophils Relative: 0 %
Eosinophils Absolute: 0 10*3/uL (ref 0.0–0.5)
Eosinophils Relative: 0 %
HCT: 39.8 % (ref 39.0–52.0)
Hemoglobin: 12.8 g/dL — ABNORMAL LOW (ref 13.0–17.0)
Immature Granulocytes: 1 %
Lymphocytes Relative: 6 %
Lymphs Abs: 0.8 10*3/uL (ref 0.7–4.0)
MCH: 30.9 pg (ref 26.0–34.0)
MCHC: 32.2 g/dL (ref 30.0–36.0)
MCV: 96.1 fL (ref 80.0–100.0)
Monocytes Absolute: 0.1 10*3/uL (ref 0.1–1.0)
Monocytes Relative: 1 %
Neutro Abs: 12.9 10*3/uL — ABNORMAL HIGH (ref 1.7–7.7)
Neutrophils Relative %: 92 %
Platelets: 203 10*3/uL (ref 150–400)
RBC: 4.14 MIL/uL — ABNORMAL LOW (ref 4.22–5.81)
RDW: 15.7 % — ABNORMAL HIGH (ref 11.5–15.5)
WBC: 14 10*3/uL — ABNORMAL HIGH (ref 4.0–10.5)
nRBC: 0 % (ref 0.0–0.2)

## 2021-03-17 LAB — GLUCOSE, CAPILLARY
Glucose-Capillary: 164 mg/dL — ABNORMAL HIGH (ref 70–99)
Glucose-Capillary: 183 mg/dL — ABNORMAL HIGH (ref 70–99)

## 2021-03-17 LAB — URINALYSIS, ROUTINE W REFLEX MICROSCOPIC
Bilirubin Urine: NEGATIVE
Glucose, UA: NEGATIVE mg/dL
Hgb urine dipstick: NEGATIVE
Ketones, ur: NEGATIVE mg/dL
Leukocytes,Ua: NEGATIVE
Nitrite: NEGATIVE
Protein, ur: 30 mg/dL — AB
Specific Gravity, Urine: 1.024 (ref 1.005–1.030)
pH: 5.5 (ref 5.0–8.0)

## 2021-03-17 LAB — PROTIME-INR
INR: 1.1 (ref 0.8–1.2)
Prothrombin Time: 14.3 seconds (ref 11.4–15.2)

## 2021-03-17 LAB — RESP PANEL BY RT-PCR (FLU A&B, COVID) ARPGX2
Influenza A by PCR: POSITIVE — AB
Influenza B by PCR: NEGATIVE
SARS Coronavirus 2 by RT PCR: NEGATIVE

## 2021-03-17 LAB — LACTIC ACID, PLASMA
Lactic Acid, Venous: 3.9 mmol/L (ref 0.5–1.9)
Lactic Acid, Venous: 2.1 mmol/L (ref 0.5–1.9)

## 2021-03-17 MED ORDER — SODIUM CHLORIDE 0.9 % IV BOLUS
1000.0000 mL | Freq: Once | INTRAVENOUS | Status: AC
  Administered 2021-03-17: 1000 mL via INTRAVENOUS

## 2021-03-17 MED ORDER — ASPIRIN 81 MG PO CHEW: 81.0000 mg | CHEWABLE_TABLET | Freq: Every day | ORAL | Status: DC

## 2021-03-17 MED ORDER — LACTATED RINGERS IV SOLN: INTRAVENOUS | Status: DC

## 2021-03-17 MED ORDER — INSULIN ASPART 100 UNIT/ML IJ SOLN
0.0000 [IU] | INTRAMUSCULAR | Status: DC
  Administered 2021-03-17: 3 [IU] via SUBCUTANEOUS

## 2021-03-17 MED ORDER — NOREPINEPHRINE 4 MG/250ML-% IV SOLN
0.0000 ug/min | INTRAVENOUS | Status: DC
Start: 2021-03-17 — End: 2021-03-17
  Administered 2021-03-17: 2 ug/min via INTRAVENOUS
  Filled 2021-03-17: qty 250

## 2021-03-17 MED ORDER — CHLORHEXIDINE GLUCONATE CLOTH 2 % EX PADS
6.0000 | MEDICATED_PAD | Freq: Every day | CUTANEOUS | Status: DC
  Administered 2021-03-17 – 2021-03-18 (×2): 6 via TOPICAL

## 2021-03-17 MED ORDER — OSELTAMIVIR PHOSPHATE 75 MG PO CAPS
75.0000 mg | ORAL_CAPSULE | Freq: Two times a day (BID) | ORAL | Status: DC
  Filled 2021-03-17: qty 1

## 2021-03-17 MED ORDER — METRONIDAZOLE 500 MG/100ML IV SOLN
500.0000 mg | Freq: Once | INTRAVENOUS | Status: AC
  Administered 2021-03-17: 500 mg via INTRAVENOUS
  Filled 2021-03-17: qty 100

## 2021-03-17 MED ORDER — HEPARIN SODIUM (PORCINE) 5000 UNIT/ML IJ SOLN
5000.0000 [IU] | Freq: Three times a day (TID) | INTRAMUSCULAR | Status: DC
  Administered 2021-03-18 – 2021-03-22 (×14): 5000 [IU] via SUBCUTANEOUS
  Filled 2021-03-17 (×14): qty 1

## 2021-03-17 MED ORDER — VANCOMYCIN HCL 1250 MG/250ML IV SOLN
1250.0000 mg | INTRAVENOUS | Status: DC
  Filled 2021-03-17: qty 250

## 2021-03-17 MED ORDER — VANCOMYCIN HCL IN DEXTROSE 1-5 GM/200ML-% IV SOLN
1000.0000 mg | Freq: Once | INTRAVENOUS | Status: AC
  Administered 2021-03-17: 1000 mg via INTRAVENOUS
  Filled 2021-03-17: qty 200

## 2021-03-17 MED ORDER — OSELTAMIVIR PHOSPHATE 30 MG PO CAPS
30.0000 mg | ORAL_CAPSULE | Freq: Two times a day (BID) | ORAL | Status: AC
  Administered 2021-03-17 – 2021-03-22 (×10): 30 mg via ORAL
  Filled 2021-03-17 (×12): qty 1

## 2021-03-17 MED ORDER — ORAL CARE MOUTH RINSE
15.0000 mL | Freq: Two times a day (BID) | OROMUCOSAL | Status: DC
  Administered 2021-03-17 – 2021-03-21 (×9): 15 mL via OROMUCOSAL

## 2021-03-17 MED ORDER — ACETAMINOPHEN 325 MG PO TABS
650.0000 mg | ORAL_TABLET | Freq: Once | ORAL | Status: AC
  Administered 2021-03-17: 650 mg via ORAL
  Filled 2021-03-17: qty 2

## 2021-03-17 MED ORDER — SODIUM CHLORIDE 0.9 % IV SOLN
2.0000 g | Freq: Two times a day (BID) | INTRAVENOUS | Status: DC
  Administered 2021-03-18: 2 g via INTRAVENOUS
  Filled 2021-03-17: qty 2

## 2021-03-17 MED ORDER — SODIUM CHLORIDE 0.9 % IV SOLN
250.0000 mL | INTRAVENOUS | Status: DC
  Administered 2021-03-18: 250 mL via INTRAVENOUS

## 2021-03-17 MED ORDER — NOREPINEPHRINE 4 MG/250ML-% IV SOLN: 0.0000 ug/min | INTRAVENOUS | Status: DC

## 2021-03-17 MED ORDER — SODIUM CHLORIDE 0.9 % IV SOLN
2.0000 g | Freq: Once | INTRAVENOUS | Status: AC
  Administered 2021-03-17: 2 g via INTRAVENOUS
  Filled 2021-03-17: qty 2

## 2021-03-17 NOTE — ED Notes (Signed)
RT assessed pt, respiratory status stable on 2 Lpm at this time w/no distress noted at this time. RT will continue to monitor.

## 2021-03-17 NOTE — Progress Notes (Signed)
Received a phone call from Facility: [facility]  Requesting MD/PA: Leonia Corona Patient with h/o HTN, T2DM, SSS s/p pacemaker, atrial fibrillation, HTN, BPH presenting with shortness of breath and hypoxia at his facility to the 70s per daughter. From SNF and hasn't been eating or drinking well. Found to be flu positive A. Oxygen of 92% on room air and placed on 2L [place]. Code sepsis originally activated and given broad spectrum antibiotics before flu test resulted. Lactic acid 3.9>2.1, wbc: 14, BUN: 27. Blood cx pending. CXR with no acute consolidation. Bolused 1L and placed on IVF.   Also pacemaker battery needs replaced and scheduled for tomorrow. Cardiology called and asked to be consulted and they will see him.   I asked PA to start tamiflu  Plan of care: observation to cardiac telemetry. Please call cardiology when patient arrives.   The patient will be accepted for admission to telemetry at [facility] when bed is available.   Nursing staff, Please call the [facility] Admits & Consults System-Wide number at the top of Amion at the time of the patient's arrival so that the patient can be paged to the admitting physician.   Lanney Gins, M.D. [facility]

## 2021-03-17 NOTE — ED Notes (Signed)
Per family pt was 96% RA Thursday. Today 90-92% RA, placed pt on 2L [place], sats remain 92%,pt is mouth breathing

## 2021-03-17 NOTE — Progress Notes (Signed)
An USGPIV (ultrasound guided PIV) has been placed for short-term vasopressor infusion. Correctly placed ivWatch must be used when administering Vasopressors. Should this treatment be needed beyond 72 hours, central line access should be obtained.  It will be the responsibility of the bedside nurse to follow best practice to prevent extravasations.   

## 2021-03-17 NOTE — ED Notes (Signed)
BP on monitor 78/56 (65); manual BP obtained by Augusto Gamble RN - manual BP 80/50

## 2021-03-17 NOTE — Progress Notes (Addendum)
eLink Physician-Brief Progress Note Patient Name: Nathan Alvarez DOB: 12/20/1924 MRN: [id number]   Date of Service  03/17/2021  HPI/Events of Note  53M with hx SSS s/p PM, atrial fib, HTN, DM admitted for septic shock secondary to influenza A. On Vanc, Cefepime and tamiflu.  Improved BP 160/66. Levophed discontinued.  eICU Interventions  Elink available as needed  1:15 AM Ca 7.7. Give Ca Cl 1 g     Intervention Category Evaluation Type: New Patient Evaluation  Alyn Riedinger Mechele Collin 03/17/2021, 9:57 PM

## 2021-03-17 NOTE — Progress Notes (Signed)
Elink following for sepsis protocol. 

## 2021-03-17 NOTE — Progress Notes (Signed)
Pharmacy Antibiotic Note  Nathan Alvarez is a 87 y.o. male admitted on 03/17/2021 with  sepsis 2/2 unknown source .  Pharmacy has been consulted for cefepime and vancomycin dosing.  WBC elevated. LA 3.9. SCr wnl   Plan: -Cefepime 2 gm IV 12 hours -Vancomycin 1 gm IV now followed by Vancomycin 1250 mg IV Q 48 hrs. Goal AUC 400-550. Expected AUC: 490 SCr used: 0.99 -Monitor CBC, renal fx, cultures and clinical progress -Vanc levels as indicated       Temp (24hrs), Avg:101.7 F (38.7 C), Min:101.7 F (38.7 C), Max:101.7 F (38.7 C)  Recent Labs  Lab 03/14/21 1207 03/17/21 1420  WBC 9.1 14.0*  CREATININE 0.93 0.99  LATICACIDVEN  --  3.9*    Estimated Creatinine Clearance: 35 mL/min (by C-G formula based on SCr of 0.99 mg/dL).    Allergies  Allergen Reactions   Penicillins     unknown    Antimicrobials this admission: Cefepime 12/11 >>  Vancomycin 12/11 >>  Metronidazole 12/11 >>  Dose adjustments this admission:   Microbiology results: 12/11 BCx:  12/11 UCx:     Thank you for allowing pharmacy to be a part of this patient's care.  Vinnie Level, PharmD., BCPS, BCCCP Clinical Pharmacist Please refer to [facility] for unit-specific pharmacist

## 2021-03-17 NOTE — H&P (Signed)
NAME:  Nathan Alvarez, MRN:  [id number], DOB:  1924-06-15, LOS: 0 ADMISSION DATE:  03/17/2021, CONSULTATION DATE:  12/11 REFERRING MD:  Samson Frederic, CHIEF COMPLAINT:  Hypoxia   History of Present Illness:   Nathan Alvarez, is a 87 y.o. male, who presented to the [facility] with a chief complaint of hypoxia  They have a pertinent past medical history of Afib, Sick sinus syndrome s/p pacemaker, DM2, GERD, HB, HTN, HLD, chronic anemia  The patient resided in [facility]. They were brought to [facility] with complaints of SOB, hypothermia, and O2 sats of 72 on RA. Of note patient was scheduled to have pacemaker battery replaced on 12/12.  ED course was notable for a tmax of 101.7, lactic level of 3.9. The patient became hypotensive (80/50). A code sepsis was initated.  1 L IVF was given. PCR resulted as Flu A +. Blood cultures and urine cultures were obtained. The patient was started on vanc, cefepime, flagyl, tamiflu.   PCCM was consulted for admission and management  Pertinent  Medical History  Afib, DM2, GERD, HB, Sick sinus syndrome s/p pacemaker, HTN, HLD, chronic anemia  Significant Hospital Events: Including procedures, antibiotic start and stop dates in addition to other pertinent events   12/11 presented to [facility], Flu A +, code sepsis, VANC, CEFE, Flagyl, Flu A  Interim History / Subjective:  See above  Subjective: Endorses some SOB, denies chest pain, denies N, V, D. Last BM per patient 12/9. Denies bleeding.  Objective   Blood pressure (!) 160/66, pulse 67, temperature (!) 97.3 F (36.3 C), temperature source Axillary, resp. rate (!) 26, weight 51.2 kg, SpO2 98 %.        Intake/Output Summary (Last 24 hours) at 03/17/2021 2225 Last data filed at 03/17/2021 2200 Gross per 24 hour  Intake 1580.04 ml  Output --  Net 1580.04 ml   Filed Weights   03/17/21 2150  Weight: 51.2 kg    Examination: General: In bed, NAD, appears comfortable HEENT: MM pink/dry, anicteric,  atraumatic Neuro: RASS 0, PERRL L 54mm, R 2 mm, reactive to light, GCS 15, MAE 5/5, no focal deficits CV: S1S2, V paced rhythm, no m/r/g appreciated PULM:  clear in the upper lobes, clear in the lower lobes, trachea midline, chest expansion symmetric GI: soft, bsx4 active, non-tender   Extremities: warm/dry, no pretibial edema, capillary refill less than 3 seconds  Skin: Stage 2 to sacrum. See below. No other rashes or lesions      NA 134 GLU 212 BUN 27 AG WNL Lactic acid 3.9>2.1 INR 1.1 Flu A positive Resolved Hospital Problem list     Assessment & Plan:  Influenza A infection Septic shock, secondary to flu A Acute respiratory failure with hypoxia secondary to Flu A Lactic acidosis, secondary to flu A code sepsis initiated, BP 80/50 at [facility], 30cc/kg IVF given. Lactic acid 3.9>2.1, UA neg. WBC 14.0, Tmax 101.7  CXR with no obvious infiltrate. 12 lead, vpaced complexes, no obvious ST changes. Suspect sepsis secondary to FLU A. 1.5L fluid resuscitation Now off levophed. O2 sats of 72 on RA at SNF. On 2LNC 95%. -Admit to ICU. VS per protocol. -Goal MAP 60-65, fluid resuscitated. Start peripheral pressors if needed -Continue Vanc and cefepime. Narrow as cultures result. Pt resident of SNF.  -Continue tamiflu -Follow up BC/UC/ narrow as cultures result -Obtain ECHO -Pulm toilet as able -Goal SPO2 92-98%. Adjust O2 to goal. -Lactate trending down  HX HB, sick sinus syndrome s/p pacemaker placement HX  Afib PTA patient was scheduled to have pacemaker battery replaced on 12/12. Does not appear to be on [facility].  V paced on presentation.  -[facility] APP discussed case with Dr. Quentin Ore with cardiology who will consult on patient while admitted. Follow up recs.  -Holding home lasix and coreg at this time -resume ASA 81 in AM -s/p 1.5 L fluid resusitation. Stopping LR at 50, regular diet  DM2 BG 212 -Blood Glucose goal 140-180. -SSI -check A1C  GERD -PPI  HX HTN HX HLD -Holding home  antihypertensive in the setting of hypotension. Reevaluate in AM  HX chronic anemia HCT 35 on 12/8 now 39.8 HGB 11.8>12.8, suspect dry -Recheck in AM after IVF resuscitation.  Pressure Injury-POA Stage 2 -Woc consult  DNR/DNI -See GOC below  Best Practice (right click and "Reselect all SmartList Selections" daily)   Diet/type: Regular consistency (see orders) DVT prophylaxis: prophylactic heparin  GI prophylaxis: PPI Lines: N/A Foley:  N/A Code Status:  DNR Last date of multidisciplinary goals of care discussion [12/11 DNR/DNI: confirmed with daughter Leeroy Bock at [phone]. Patient does have living will]  Labs   CBC: Recent Labs  Lab 03/14/21 1207 03/17/21 1420  WBC 9.1 14.0*  NEUTROABS  --  12.9*  HGB 11.8* 12.8*  HCT 35.5* 39.8  MCV 92 96.1  PLT 250 [id number]    Basic Metabolic Panel: Recent Labs  Lab 03/14/21 1207 03/17/21 1420  NA 136 134*  K 4.0 4.2  CL 99 96*  CO2 30* 28  GLUCOSE 151* 212*  BUN 21 27*  CREATININE 0.93 0.99  CALCIUM 9.0 9.1   GFR: Estimated Creatinine Clearance: 33.8 mL/min (by C-G formula based on SCr of 0.99 mg/dL). Recent Labs  Lab 03/14/21 1207 03/17/21 1420 03/17/21 1623  WBC 9.1 14.0*  --   LATICACIDVEN  --  3.9* 2.1*    Liver Function Tests: Recent Labs  Lab 03/17/21 1420  AST 13*  ALT 8  ALKPHOS 75  BILITOT 0.5  PROT 6.8  ALBUMIN 3.7   No results for input(s): LIPASE, AMYLASE in the last 168 hours. No results for input(s): AMMONIA in the last 168 hours.  ABG No results found for: PHART, PCO2ART, PO2ART, HCO3, TCO2, ACIDBASEDEF, O2SAT   Coagulation Profile: Recent Labs  Lab 03/17/21 1420  INR 1.1    Cardiac Enzymes: No results for input(s): CKTOTAL, CKMB, CKMBINDEX, TROPONINI in the last 168 hours.  HbA1C: Hgb A1c MFr Bld  Date/Time Value Ref Range Status  12/14/2020 02:33 PM 6.6 (H) 4.8 - 5.6 % Final    Comment:    (NOTE) Pre diabetes:          5.7%-6.4%  Diabetes:               >6.4%  Glycemic control for   <7.0% adults with diabetes   04/21/2019 04:15 AM 6.4 (H) 4.8 - 5.6 % Final    Comment:    (NOTE) Pre diabetes:          5.7%-6.4% Diabetes:              >6.4% Glycemic control for   <7.0% adults with diabetes     CBG: Recent Labs  Lab 03/17/21 2150  GLUCAP 164*    Review of Systems:   Positives in bold  Gen: fever, chills, weight change, fatigue, night sweats HEENT:  blurred vision, double vision, hearing loss, tinnitus, sinus congestion, rhinorrhea, sore throat, neck stiffness, dysphagia PULM:  shortness of breath, cough, sputum production, hemoptysis, wheezing CV:  chest pain, edema, orthopnea, paroxysmal nocturnal dyspnea, palpitations GI:  abdominal pain, nausea, vomiting, diarrhea, hematochezia, melena, constipation, change in bowel habits GU: dysuria, hematuria, polyuria, oliguria, urethral discharge Endocrine: hot or cold intolerance, polyuria, polyphagia or appetite change Derm: rash, dry skin, scaling or peeling skin change Heme: easy bruising, bleeding, bleeding gums Neuro: headache, numbness, weakness, slurred speech, loss of memory or consciousness   Past Medical History:  He,  has a past medical history of A-fib ([place]), Dizziness, DM (diabetes mellitus) ([place]), Esophagitis, reflux, Heart block, Hyperlipidemia, Hypertension, essential, benign, Memory deficit, Pathological fracture of left hip due to age-related osteoporosis ([place]) (04/25/2019), SOB (shortness of breath), and SSS (sick sinus syndrome) ([place]).   Surgical History:   Past Surgical History:  Procedure Laterality Date   CATARACT EXTRACTION Right    HIP ARTHROPLASTY Left 04/22/2019   Procedure: ARTHROPLASTY BIPOLAR HIP (HEMIARTHROPLASTY);  Surgeon: Altamese [place]lass[place], MD;  Location: [facility];  Service: Orthopedics;  Laterality: Left;   INGUINAL HERNIA REPAIR     INSERT / REPLACE / REMOVE PACEMAKER     PACEMAKER INSERTION     TONSILLECTOMY AND ADENOIDECTOMY       Social  History:   reports that he quit smoking about 65 years ago. His smoking use included cigarettes. He has never been exposed to tobacco smoke. He has never used smokeless tobacco. He reports that he does not drink alcohol and does not use drugs.   Family History:  His family history includes Heart disease in his mother.   Allergies Allergies  Allergen Reactions   Penicillins     unknown     Home Medications  Prior to Admission medications   Medication Sig Start Date End Date Taking? Authorizing Provider  acetaminophen (TYLENOL) 325 MG tablet Take 2 tablets (650 mg total) by mouth every 6 (six) hours as needed for mild pain (or Fever >/= 101). 12/16/20   Roxan Hockey, MD  aspirin EC 81 MG EC tablet Take 1 tablet (81 mg total) by mouth daily with breakfast. Swallow whole. 12/16/20   Roxan Hockey, MD  calcium carbonate (OS-CAL - DOSED IN MG OF ELEMENTAL CALCIUM) 1250 (500 Ca) MG tablet Take 1 tablet (500 mg of elemental calcium total) by mouth 2 (two) times daily with a meal. 12/16/20   Emokpae, Courage, MD  carvedilol (COREG) 6.25 MG tablet Take 1 tablet (6.25 mg total) by mouth 2 (two) times daily. For BP and Heart 12/16/20   Roxan Hockey, MD  Ferrous Sulfate (IRON) 325 (65 Fe) MG TABS Take 325 mg by mouth daily.    [provider]  JANUVIA 50 MG tablet Take 1 tablet (50 mg total) by mouth daily. 12/16/20   Roxan Hockey, MD  PARoxetine (PAXIL) 10 MG tablet Take 10 mg by mouth daily. 03/04/21   [provider]  Skin Protectants, Misc. (BAZA PROTECT EX) Apply 1 application topically See admin instructions. Apply to affected areas of buttocks, sacrum, and genitals twice daily, may apply as needed with each incontinence episode    [provider]     Critical care time: 70 minutes    [employer]., MSN, APRN, AGACNP-BC [facility]  03/17/2021 , 10:25 PM  Please see [email or web address] for pager details  If no response, please  call [phone] After hours, please call Elink at [phone]

## 2021-03-17 NOTE — ED Provider Notes (Addendum)
[facility] EMERGENCY DEPT Provider Note   CSN: [id number] Arrival date & time: 03/17/21  1332     History Chief Complaint  Patient presents with   Shortness of Breath    Nathan Alvarez is a 87 y.o. male.  HPI  87 year old male with a history of atrial fibrillation, dizziness, diabetes, GERD, heart block, hyperlipidemia, hypertension, memory deficits, pathological fracture of the left hip due to osteoporosis, sinus sick syndrome s/p pacemaker, who presents to the emergency department today for evaluation of hypothermia and hypoxia noted by his assisted living facility prior to arrival.  His daughter is at bedside and assists with the history.  She states that the facility was unable to get a temperature and stated that his oxygen was in the 70s today.  The patient states that he has been fatigued recently and has had a decreased appetite for the last couple of days.  He denies any cough, shortness of breath, chest pain, abdominal pain, vomiting, diarrhea or urinary symptoms.  Daughter at bedside does state that he had a PCP appointment on Friday and had reassuring vitals at that time however following this has been developed a URI.  They further state that patient was this to have his pacemaker battery replaced tomorrow.  Past Medical History:  Diagnosis Date   A-fib (HCC)    Dizziness    DM (diabetes mellitus) (HCC)    Esophagitis, reflux    Heart block    Hyperlipidemia    Hypertension, essential, benign    Memory deficit    Pathological fracture of left hip due to age-related osteoporosis (HCC) 04/25/2019   SOB (shortness of breath)    SSS (sick sinus syndrome) (HCC)     Patient Active Problem List   Diagnosis Date Noted   Influenza A 03/17/2021   Hypotension 03/17/2021   Weakness generalized    CAP (community acquired pneumonia) 01/21/2021   Pressure injury of skin 01/21/2021   Fall    Aspiration pneumonia (HCC) 12/15/2020   Sepsis due to aspiration  pneumonia and enterocolitis 12/15/2020   AKI (acute kidney injury) (HCC) 12/14/2020   Enterocolitis-C diff test is Equivocal  12/14/2020   Sinus node dysfunction (HCC) 03/06/2020   Pacemaker 03/06/2020   Benign prostatic hyperplasia with nocturia 05/23/2019   Vitamin D deficiency 04/25/2019   Pathological fracture of left hip due to age-related osteoporosis (HCC) 04/25/2019   Femoral neck fracture (HCC) 04/21/2019   Hypertension 04/21/2019   Diabetes (HCC) 04/21/2019   Hyponatremia 04/21/2019   Anemia 04/21/2019   Hypertension, essential, benign 07/13/2017    Past Surgical History:  Procedure Laterality Date   CATARACT EXTRACTION Right    HIP ARTHROPLASTY Left 04/22/2019   Procedure: ARTHROPLASTY BIPOLAR HIP (HEMIARTHROPLASTY);  Surgeon: Myrene Galas, MD;  Location: [facility] OR;  Service: Orthopedics;  Laterality: Left;   INGUINAL HERNIA REPAIR     INSERT / REPLACE / REMOVE PACEMAKER     PACEMAKER INSERTION     TONSILLECTOMY AND ADENOIDECTOMY         Family History  Problem Relation Age of Onset   Heart disease Mother     Social History   Tobacco Use   Smoking status: Former    Types: Cigarettes    Quit date: 01/01/1956    Years since quitting: 65.2    Passive exposure: Never   Smokeless tobacco: Never  Vaping Use   Vaping Use: Never used  Substance Use Topics   Alcohol use: No   Drug use: No  Home Medications Prior to Admission medications   Medication Sig Start Date End Date Taking? Authorizing Provider  acetaminophen (TYLENOL) 325 MG tablet Take 2 tablets (650 mg total) by mouth every 6 (six) hours as needed for mild pain (or Fever >/= 101). 12/16/20   Roxan Hockey, MD  aspirin EC 81 MG EC tablet Take 1 tablet (81 mg total) by mouth daily with breakfast. Swallow whole. 12/16/20   Roxan Hockey, MD  calcium carbonate (OS-CAL - DOSED IN MG OF ELEMENTAL CALCIUM) 1250 (500 Ca) MG tablet Take 1 tablet (500 mg of elemental calcium total) by mouth 2 (two) times  daily with a meal. 12/16/20   Emokpae, Courage, MD  carvedilol (COREG) 6.25 MG tablet Take 1 tablet (6.25 mg total) by mouth 2 (two) times daily. For BP and Heart 12/16/20   Roxan Hockey, MD  Ferrous Sulfate (IRON) 325 (65 Fe) MG TABS Take 325 mg by mouth daily.    [provider]  JANUVIA 50 MG tablet Take 1 tablet (50 mg total) by mouth daily. 12/16/20   Roxan Hockey, MD  PARoxetine (PAXIL) 10 MG tablet Take 10 mg by mouth daily. 03/04/21   [provider]  Skin Protectants, Misc. (BAZA PROTECT EX) Apply 1 application topically See admin instructions. Apply to affected areas of buttocks, sacrum, and genitals twice daily, may apply as needed with each incontinence episode    [provider]    Allergies    Penicillins  Review of Systems   Review of Systems  Constitutional:  Positive for appetite change and fatigue. Negative for chills and fever.  HENT:  Negative for ear pain and sore throat.   Eyes:  Negative for pain and visual disturbance.  Respiratory:  Negative for cough and shortness of breath.   Cardiovascular:  Negative for chest pain.  Gastrointestinal:  Negative for abdominal pain, constipation, diarrhea, nausea and vomiting.  Genitourinary:  Negative for dysuria and hematuria.  Musculoskeletal:  Negative for back pain.  Skin:  Negative for rash.  Neurological:  Negative for headaches.  All other systems reviewed and are negative.  Physical Exam Updated Vital Signs BP (!) 80/55   Pulse 62   Temp 97.6 F (36.4 C) (Oral)   Resp (!) 22   SpO2 96%   Physical Exam Vitals and nursing note reviewed.  Constitutional:      General: He is not in acute distress.    Appearance: He is well-developed.  HENT:     Head: Normocephalic and atraumatic.  Eyes:     Conjunctiva/sclera: Conjunctivae normal.  Cardiovascular:     Heart sounds: No murmur heard.    Comments: Irregularly irregular rhythm Pulmonary:     Effort: Pulmonary effort is normal.  Tachypnea present. No respiratory distress.     Breath sounds: Examination of the right-lower field reveals rhonchi. Examination of the left-lower field reveals rhonchi. Rhonchi present.  Abdominal:     Palpations: Abdomen is soft.     Tenderness: There is no abdominal tenderness.  Musculoskeletal:        General: No swelling.     Cervical back: Neck supple.  Skin:    General: Skin is warm and dry.     Capillary Refill: Capillary refill takes less than 2 seconds.  Neurological:     Mental Status: He is alert.  Psychiatric:        Mood and Affect: Mood normal.    ED Results / Procedures / Treatments   Labs (all labs ordered are listed,  but only abnormal results are displayed) Labs Reviewed  RESP PANEL BY RT-PCR (FLU A&B, COVID) ARPGX2 - Abnormal; Notable for the following components:      Result Value   Influenza A by PCR POSITIVE (*)    All other components within normal limits  COMPREHENSIVE METABOLIC PANEL - Abnormal; Notable for the following components:   Sodium 134 (*)    Chloride 96 (*)    Glucose, Bld 212 (*)    BUN 27 (*)    AST 13 (*)    All other components within normal limits  LACTIC ACID, PLASMA - Abnormal; Notable for the following components:   Lactic Acid, Venous 3.9 (*)    All other components within normal limits  LACTIC ACID, PLASMA - Abnormal; Notable for the following components:   Lactic Acid, Venous 2.1 (*)    All other components within normal limits  CBC WITH DIFFERENTIAL/PLATELET - Abnormal; Notable for the following components:   WBC 14.0 (*)    RBC 4.14 (*)    Hemoglobin 12.8 (*)    RDW 15.7 (*)    Neutro Abs 12.9 (*)    Abs Immature Granulocytes 0.09 (*)    All other components within normal limits  URINALYSIS, ROUTINE W REFLEX MICROSCOPIC - Abnormal; Notable for the following components:   Protein, ur 30 (*)    All other components within normal limits  CULTURE, BLOOD (ROUTINE X 2)  CULTURE, BLOOD (ROUTINE X 2)  PROTIME-INR     EKG None  Radiology DG Chest Portable 1 View  Result Date: 03/17/2021 CLINICAL DATA:  Shortness of breath. EXAM: PORTABLE CHEST 1 VIEW COMPARISON:  03/03/2021 FINDINGS: Left-sided pacemaker unchanged. Lungs are somewhat hypoinflated without focal airspace consolidation or effusion. Borderline cardiomegaly unchanged. Remainder of the exam is unchanged. IMPRESSION: Hypoinflation without acute cardiopulmonary disease. Electronically Signed   By: Marin Olp M.D.   On: 03/17/2021 14:19    Procedures Procedures   CRITICAL CARE Performed by: Rodney Booze   Total critical care time: 37 minutes  Critical care time was exclusive of separately billable procedures and treating other patients.  Critical care was necessary to treat or prevent imminent or life-threatening deterioration.  Critical care was time spent personally by me on the following activities: development of treatment plan with patient and/or surrogate as well as nursing, discussions with consultants, evaluation of patient's response to treatment, examination of patient, obtaining history from patient or surrogate, ordering and performing treatments and interventions, ordering and review of laboratory studies, ordering and review of radiographic studies, pulse oximetry and re-evaluation of patient's condition.   Medications Ordered in ED Medications  lactated ringers infusion ( Intravenous New Bag/Given 03/17/21 1952)  vancomycin (VANCOREADY) IVPB 1250 mg/250 mL (has no administration in time range)  ceFEPIme (MAXIPIME) 2 g in sodium chloride 0.9 % 100 mL IVPB (has no administration in time range)  oseltamivir (TAMIFLU) capsule 75 mg (has no administration in time range)  norepinephrine (LEVOPHED) 4mg  in 266mL (0.016 mg/mL) premix infusion (2 mcg/min Intravenous New Bag/Given 03/17/21 1953)  acetaminophen (TYLENOL) tablet 650 mg (650 mg Oral Given 03/17/21 1433)  sodium chloride 0.9 % bolus 1,000 mL (0 mLs Intravenous  Stopped 03/17/21 1633)  ceFEPIme (MAXIPIME) 2 g in sodium chloride 0.9 % 100 mL IVPB (0 g Intravenous Stopped 03/17/21 1810)  metroNIDAZOLE (FLAGYL) IVPB 500 mg (0 mg Intravenous Stopped 03/17/21 1810)  vancomycin (VANCOCIN) IVPB 1000 mg/200 mL premix (0 mg Intravenous Stopped 03/17/21 1810)  sodium chloride 0.9 % bolus 1,000  mL (0 mLs Intravenous Stopped 03/17/21 1942)    ED Course  I have reviewed the triage vital signs and the nursing notes.  Pertinent labs & imaging results that were available during my care of the patient were reviewed by me and considered in my medical decision making (see chart for details).    MDM Rules/Calculators/A&P                           87 y/o male presents for eval of hypothermia and hypoxia noted at his facility. Daughter also reports increased fatigue and decreased po intake.   Pt found to be febrile and tachypneic on arrival. He is satting at 97% on RA. He was given tylenol, started on IVF and broad spectrum abx.  Reviewed/interpreted labs CBC with mild leukocytosis and mild anemia CMP with mildly elevated BUN, normal cr and lfts Coags wnl Lactic acid elevated at 3.9 Blood cultures obtained UA with proteinuria, no evidence of infection COVID/flu - +flu  EKG - vpaced complexes, left axis deviation, nonspecific t abnormalities diffusely  CXR reviewed/interpreted - Hypoinflation without acute cardiopulmonary disease.   4:23 PM CONSULT with Dr. Quentin Ore with cardiology who will consult on pt during admission.   5:21 PM CONSULT with Dr. Rogers Blocker with hospitalist service who accepts patient for admission  Pt becoming hypotensive. He has already received his 30cc/kg bolus. We will discuss goals of care with daughter   Had conversation with daughter. She would like pt to be started on pressors and is in agreement with ICU admission. She does state that the pt is a DNR. I also spoke with the patient and he endorsed this as well.   7:33 PM CONSULT with  Dr. Patsey Berthold who accepts patient for admission to the ICU  Final Clinical Impression(s) / ED Diagnoses Final diagnoses:  Influenza A    Rx / DC Orders ED Discharge Orders     None        Rodney Booze, PA-C 03/17/21 1743    Ashleah Valtierra, [place], PA-C 03/17/21 [place], [place], DO 03/20/21 1606

## 2021-03-17 NOTE — ED Triage Notes (Signed)
Per brought in by daughter in law from [facility] for new onset of SOB, low O2 sats of 72% RA and warm to touch w/no fever at facility.   Pt with labored breathing. 92%   Pt is scheduled for replacement of pacemaker tomorrow morning at [place]

## 2021-03-18 ENCOUNTER — Encounter (HOSPITAL_COMMUNITY): Payer: Self-pay | Admitting: Pulmonary Disease

## 2021-03-18 ENCOUNTER — Ambulatory Visit (HOSPITAL_COMMUNITY): Admission: RE | Admit: 2021-03-18 | Payer: Medicare Other | Source: Home / Self Care | Admitting: Internal Medicine

## 2021-03-18 ENCOUNTER — Encounter (HOSPITAL_COMMUNITY)
Admission: EM | Disposition: A | Discharge status: Skilled Nursing Facility | Payer: Self-pay | Source: Home / Self Care | Attending: Internal Medicine

## 2021-03-18 DIAGNOSIS — I495 Sick sinus syndrome: Secondary | ICD-10-CM

## 2021-03-18 LAB — GLUCOSE, CAPILLARY
Glucose-Capillary: 80 mg/dL (ref 70–99)
Glucose-Capillary: 91 mg/dL (ref 70–99)
Glucose-Capillary: 72 mg/dL (ref 70–99)
Glucose-Capillary: 104 mg/dL — ABNORMAL HIGH (ref 70–99)
Glucose-Capillary: 119 mg/dL — ABNORMAL HIGH (ref 70–99)
Glucose-Capillary: 116 mg/dL — ABNORMAL HIGH (ref 70–99)

## 2021-03-18 LAB — PHOSPHORUS: Phosphorus: 3.7 mg/dL (ref 2.5–4.6)

## 2021-03-18 LAB — BASIC METABOLIC PANEL
Anion gap: 6 (ref 5–15)
BUN: 26 mg/dL — ABNORMAL HIGH (ref 8–23)
CO2: 22 mmol/L (ref 22–32)
Calcium: 7.7 mg/dL — ABNORMAL LOW (ref 8.9–10.3)
Chloride: 104 mmol/L (ref 98–111)
Creatinine, Ser: 0.9 mg/dL (ref 0.61–1.24)
GFR, Estimated: >60 mL/min (ref >60)
Glucose, Bld: 183 mg/dL — ABNORMAL HIGH (ref 70–99)
Potassium: 3.6 mmol/L (ref 3.5–5.1)
Sodium: 132 mmol/L — ABNORMAL LOW (ref 135–145)

## 2021-03-18 LAB — MRSA NEXT GEN BY PCR, NASAL: MRSA by PCR Next Gen: NOT DETECTED

## 2021-03-18 LAB — HEMOGLOBIN A1C
Hgb A1c MFr Bld: 6 % — ABNORMAL HIGH (ref 4.8–5.6)
Mean Plasma Glucose: 125.5 mg/dL

## 2021-03-18 LAB — MAGNESIUM: Magnesium: 1.9 mg/dL (ref 1.7–2.4)

## 2021-03-18 SURGERY — PPM GENERATOR CHANGEOUT

## 2021-03-18 MED ORDER — CALCIUM CARBONATE 1250 (500 CA) MG PO TABS
1.0000 | ORAL_TABLET | Freq: Two times a day (BID) | ORAL | Status: DC
  Administered 2021-03-18 – 2021-03-22 (×8): 500 mg via ORAL
  Filled 2021-03-18 (×10): qty 1

## 2021-03-18 MED ORDER — ACETAMINOPHEN 325 MG PO TABS
650.0000 mg | ORAL_TABLET | Freq: Four times a day (QID) | ORAL | Status: DC | PRN
  Administered 2021-03-19: 650 mg via ORAL
  Filled 2021-03-18: qty 2

## 2021-03-18 MED ORDER — INSULIN ASPART 100 UNIT/ML IJ SOLN: 0.0000 [IU] | Freq: Every day | INTRAMUSCULAR | Status: DC

## 2021-03-18 MED ORDER — PAROXETINE HCL 10 MG PO TABS
10.0000 mg | ORAL_TABLET | Freq: Every day | ORAL | Status: DC
  Administered 2021-03-18 – 2021-03-22 (×5): 10 mg via ORAL
  Filled 2021-03-18 (×6): qty 1

## 2021-03-18 MED ORDER — SODIUM CHLORIDE 0.9 % IV BOLUS
1000.0000 mL | Freq: Once | INTRAVENOUS | Status: AC
  Administered 2021-03-18: 1000 mL via INTRAVENOUS

## 2021-03-18 MED ORDER — GERHARDT'S BUTT CREAM
TOPICAL_CREAM | Freq: Two times a day (BID) | CUTANEOUS | Status: DC
  Administered 2021-03-19 – 2021-03-20 (×2): 1 via TOPICAL
  Filled 2021-03-18: qty 1

## 2021-03-18 MED ORDER — FERROUS SULFATE 325 (65 FE) MG PO TABS
325.0000 mg | ORAL_TABLET | Freq: Every day | ORAL | Status: DC
  Administered 2021-03-18 – 2021-03-22 (×5): 325 mg via ORAL
  Filled 2021-03-18 (×5): qty 1

## 2021-03-18 MED ORDER — HYDRALAZINE HCL 20 MG/ML IJ SOLN
10.0000 mg | INTRAMUSCULAR | Status: DC | PRN
  Administered 2021-03-18: 10 mg via INTRAVENOUS
  Filled 2021-03-18: qty 1

## 2021-03-18 MED ORDER — ASPIRIN EC 81 MG PO TBEC
81.0000 mg | DELAYED_RELEASE_TABLET | Freq: Every day | ORAL | Status: DC
  Administered 2021-03-18 – 2021-03-22 (×5): 81 mg via ORAL
  Filled 2021-03-18 (×5): qty 1

## 2021-03-18 MED ORDER — SODIUM CHLORIDE 0.9 % IV SOLN
1.0000 g | Freq: Once | INTRAVENOUS | Status: AC
  Administered 2021-03-18: 1 g via INTRAVENOUS
  Filled 2021-03-18: qty 10

## 2021-03-18 MED ORDER — SODIUM CHLORIDE 0.9 % IV SOLN
1.0000 g | INTRAVENOUS | Status: DC
  Administered 2021-03-18 – 2021-03-22 (×5): 1 g via INTRAVENOUS
  Filled 2021-03-18 (×5): qty 10

## 2021-03-18 MED ORDER — SODIUM CHLORIDE 0.9 % IV SOLN: INTRAVENOUS | Status: DC

## 2021-03-18 MED ORDER — INSULIN ASPART 100 UNIT/ML IJ SOLN
0.0000 [IU] | Freq: Three times a day (TID) | INTRAMUSCULAR | Status: DC
  Administered 2021-03-19: 2 [IU] via SUBCUTANEOUS

## 2021-03-18 NOTE — Progress Notes (Signed)
Off pressors.  Will transfer to tele.  Will ask [facility] to assume care from 12/13 and PCCM off.  Coralyn Helling, MD [facility] Pulmonary/Critical Care Pager - [phone] 03/18/2021, 11:42 AM

## 2021-03-18 NOTE — Progress Notes (Signed)
   EP will follow at a distance.   He is not device dependent. Gen change is important but not urgent.   He should convalesce from this, discharge, then come back the following week (7-10 days post d/c) for gen change.   Above reviewed with Dr. Ricarda Frame "[address] [place], [place]  03/18/2021 7:29 AM

## 2021-03-18 NOTE — Consult Note (Addendum)
WOC Nurse Consult Note: Reason for Consult:Deep tissue injury to bilateral gluteal folds, coccyx with contributing moisture associated skin damage (MASD).  NOted frequent loose stools with fecal manager in place.  Nonintact lesions to coccyx and bilateral gluteal folds  Wound type:pressure and moisture Pressure Injury POA: Yes  maroon discoloration to bilateral gluteal folds and coccyx as well as posterior scrotum.  Measurement:MAroon discoloration:  10 cm x 4.5 cm x 0.1 cm in scattered nonintact sloughing epithelium Wound bed:red and moist Drainage (amount, consistency, odor) scant weeping Periwound:nonblanchable maroon discoloration Dressing procedure/placement/frequency: Cleanse buttocks with soap and water and pat dry Apply Gerhardts butt paste twice daily. And prn soilage.  Low air loss mattress.  Turn and reposition every two hours.   Will not follow at this time.  Please re-consult if needed.  Maple Hudson MSN, RN, FNP-BC CWON Wound, Ostomy, Continence Nurse Pager [phone]     .

## 2021-03-18 NOTE — Progress Notes (Signed)
eLink Physician-Brief Progress Note Patient Name: Nathan Alvarez DOB: [birth year] MRN: [id number]   Date of Service  03/18/2021  HPI/Events of Note  Patient with sub-optimal BP control, heart rate low 70's.  eICU Interventions  PRN Hydralazine ordered.        Thomasene Lot Mareesa Gathright 03/18/2021, 9:52 PM

## 2021-03-18 NOTE — Evaluation (Signed)
Physical Therapy Evaluation Patient Details Name: Nathan Alvarez MRN: [id number] DOB: 03-12-1925 Today's Date: 03/18/2021  History of Present Illness  pt is a 87 y/o male presenting 12/11 with complaint of hypoxia due to infulenza pneumonia, hypotensive from sepsis and hypovolemia.  PMHx, afib, DM2, GERD, HB, sick sinus syn s/p pacemaker, HTN, HLD  Clinical Impression  Pt admitted with/for flu and sepsis.  Pt needing moderate assist for basic mobility.  Pt currently limited functionally due to the problems listed. ( See problems list.)   Pt will benefit from PT to maximize function and safety in order to get ready for next venue listed below.        Recommendations for follow up therapy are one component of a multi-disciplinary discharge planning process, led by the attending physician.  Recommendations may be updated based on patient status, additional functional criteria and insurance authorization.  Follow Up Recommendations Skilled nursing-short term rehab (<3 hours/day)    Assistance Recommended at Discharge Intermittent Supervision/Assistance  Functional Status Assessment Patient has had a recent decline in their functional status and demonstrates the ability to make significant improvements in function in a reasonable and predictable amount of time.  Equipment Recommendations  Other (comment) (TBA)    Recommendations for Other Services       Precautions / Restrictions Precautions Precautions: Fall      Mobility  Bed Mobility Overal bed mobility: Needs Assistance Bed Mobility: Supine to Sit;Sit to Supine     Supine to sit: Mod assist Sit to supine: Mod assist;+2 for safety/equipment   General bed mobility comments: Cues for direction, truncal assist and LE assist    Transfers Overall transfer level: Needs assistance Equipment used: 1 person hand held assist Transfers: Sit to/from Stand;Bed to chair/wheelchair/BSC Sit to Stand: Mod assist   Step pivot transfers:  Mod assist       General transfer comment: cues for hand placement, assist forward and with boost.  w/shift and stability assist for side stepping to [facility]    Ambulation/Gait               General Gait Details: side stepping to [facility] with mod assist  Stairs            Wheelchair Mobility    Modified Rankin (Stroke Patients Only)       Balance Overall balance assessment: Needs assistance Sitting-balance support: Feet supported;Single extremity supported;Bilateral upper extremity supported Sitting balance-Leahy Scale: Poor     Standing balance support: During functional activity;Bilateral upper extremity supported Standing balance-Leahy Scale: Poor Standing balance comment: reliant on external support                             Pertinent Vitals/Pain Pain Assessment: No/denies pain    Home Living Family/patient expects to be discharged to:: Skilled nursing facility                        Prior Function Prior Level of Function : Needs assist             Mobility Comments: walks occasionally with RW and assist, but mobilizes more at a w/c level. ADLs Comments: assisted with bathing/dressing.toileting     Hand Dominance   Dominant Hand: Right    Extremity/Trunk Assessment   Upper Extremity Assessment Upper Extremity Assessment: Generalized weakness    Lower Extremity Assessment Lower Extremity Assessment: Generalized weakness    Cervical / Trunk Assessment Cervical / Trunk  Assessment: Kyphotic  Communication   Communication: HOH  Cognition Arousal/Alertness: Awake/alert Behavior During Therapy: WFL for tasks assessed/performed Overall Cognitive Status: No family/caregiver present to determine baseline cognitive functioning                                          General Comments General comments (skin integrity, edema, etc.): vss on RA    Exercises     Assessment/Plan    PT Assessment Patient  needs continued PT services  PT Problem List Decreased strength;Decreased activity tolerance;Decreased balance;Decreased mobility;Cardiopulmonary status limiting activity;Decreased knowledge of use of DME;Decreased knowledge of precautions       PT Treatment Interventions Gait training;Functional mobility training;Therapeutic activities;DME instruction;Balance training;Patient/family education    PT Goals (Current goals can be found in the Care Plan section)  Acute Rehab PT Goals Patient Stated Goal: I'm hoping to feel better soon PT Goal Formulation: With patient Time For Goal Achievement: 04/01/21 Potential to Achieve Goals: Good    Frequency Min 3X/week   Barriers to discharge        Co-evaluation               AM-PAC PT "6 Clicks" Mobility  Outcome Measure Help needed turning from your back to your side while in a flat bed without using bedrails?: A Lot Help needed moving from lying on your back to sitting on the side of a flat bed without using bedrails?: A Lot Help needed moving to and from a bed to a chair (including a wheelchair)?: A Lot Help needed standing up from a chair using your arms (e.g., wheelchair or bedside chair)?: A Lot Help needed to walk in hospital room?: Total Help needed climbing 3-5 steps with a railing? : Total 6 Click Score: 10    End of Session   Activity Tolerance: Patient tolerated treatment well;Patient limited by fatigue Patient left: in bed;with call bell/phone within reach;with bed alarm set;with nursing/sitter in room Nurse Communication: Mobility status PT Visit Diagnosis: Other abnormalities of gait and mobility (R26.89);Muscle weakness (generalized) (M62.81);Difficulty in walking, not elsewhere classified (R26.2)    Time: 1719-1740 PT Time Calculation (min) (ACUTE ONLY): 21 min   Charges:   PT Evaluation $PT Eval Moderate Complexity: 1 Mod          03/18/2021  Jacinto Halim., PT Acute Rehabilitation Services [phone]   (pager) [phone]  (office)  Eliseo Gum Alayah Knouff 03/18/2021, 5:57 PM

## 2021-03-18 NOTE — TOC Progression Note (Signed)
Transition of Care [facility]) - Initial/Assessment Note    Patient Details  Name: Nathan Alvarez MRN: [id number] Date of Birth: 21-Oct-1924  [facility] CM/SW Contact:    Ralene Bathe, LCSWA Phone Number: 03/18/2021, 4:12 PM  Clinical Narrative:                  Transition of Care Department [facility]) has reviewed patient and requested that PT and OT orders be placed to evaluate the patient's current level of functioning.  PT/OT assessments and recommendations are pending.  We will continue to monitor patient advancement through interdisciplinary progression rounds.       Patient Goals and CMS Choice        Expected Discharge Plan and Services                                                Prior Living Arrangements/Services                       Activities of Daily Living Home Assistive Devices/Equipment: Other (Comment) (unknown) ADL Screening (condition at time of admission) Patient's cognitive ability adequate to safely complete daily activities?: Yes Is the patient deaf or have difficulty hearing?: Yes Does the patient have difficulty seeing, even when wearing glasses/contacts?: No Does the patient have difficulty concentrating, remembering, or making decisions?: No Patient able to express need for assistance with ADLs?: Yes Does the patient have difficulty dressing or bathing?: Yes Independently performs ADLs?: Yes (appropriate for developmental age) Does the patient have difficulty walking or climbing stairs?: Yes Weakness of Legs: Both Weakness of Arms/Hands: Both  Permission Sought/Granted                  Emotional Assessment              Admission diagnosis:  Influenza A [J10.1] Hypotension [I95.9] Patient Active Problem List   Diagnosis Date Noted   Influenza A 03/17/2021   Hypotension 03/17/2021   Septic shock (HCC)    Acute respiratory failure with hypoxia (HCC)    Lactic acidosis    Weakness generalized     CAP (community acquired pneumonia) 01/21/2021   Pressure injury of skin 01/21/2021   Fall    Aspiration pneumonia (HCC) 12/15/2020   Sepsis due to aspiration pneumonia and enterocolitis 12/15/2020   AKI (acute kidney injury) (HCC) 12/14/2020   Enterocolitis-C diff test is Equivocal  12/14/2020   Sinus node dysfunction (HCC) 03/06/2020   Pacemaker 03/06/2020   Benign prostatic hyperplasia with nocturia 05/23/2019   Vitamin D deficiency 04/25/2019   Pathological fracture of left hip due to age-related osteoporosis (HCC) 04/25/2019   Femoral neck fracture (HCC) 04/21/2019   Hypertension 04/21/2019   Diabetes (HCC) 04/21/2019   Hyponatremia 04/21/2019   Anemia 04/21/2019   Hypertension, essential, benign 07/13/2017   PCP:  Vivien Presto, MD Pharmacy:   [facility] [place] - [address] [place] [address] Phone: [phone] Fax: [phone]     Social Determinants of Health (SDOH) Interventions    Readmission Risk Interventions No flowsheet data found.

## 2021-03-19 LAB — GLUCOSE, CAPILLARY
Glucose-Capillary: 116 mg/dL — ABNORMAL HIGH (ref 70–99)
Glucose-Capillary: 148 mg/dL — ABNORMAL HIGH (ref 70–99)
Glucose-Capillary: 76 mg/dL (ref 70–99)
Glucose-Capillary: 132 mg/dL — ABNORMAL HIGH (ref 70–99)

## 2021-03-19 LAB — CBC
HCT: 36.3 % — ABNORMAL LOW (ref 39.0–52.0)
Hemoglobin: 11.8 g/dL — ABNORMAL LOW (ref 13.0–17.0)
MCH: 30.6 pg (ref 26.0–34.0)
MCHC: 32.5 g/dL (ref 30.0–36.0)
MCV: 94 fL (ref 80.0–100.0)
Platelets: 158 10*3/uL (ref 150–400)
RBC: 3.86 MIL/uL — ABNORMAL LOW (ref 4.22–5.81)
RDW: 15.6 % — ABNORMAL HIGH (ref 11.5–15.5)
WBC: 8.1 10*3/uL (ref 4.0–10.5)
nRBC: 0 % (ref 0.0–0.2)

## 2021-03-19 LAB — BASIC METABOLIC PANEL
Anion gap: 10 (ref 5–15)
BUN: 21 mg/dL (ref 8–23)
CO2: 20 mmol/L — ABNORMAL LOW (ref 22–32)
Calcium: 8.3 mg/dL — ABNORMAL LOW (ref 8.9–10.3)
Chloride: 105 mmol/L (ref 98–111)
Creatinine, Ser: 0.84 mg/dL (ref 0.61–1.24)
GFR, Estimated: >60 mL/min (ref >60)
Glucose, Bld: 131 mg/dL — ABNORMAL HIGH (ref 70–99)
Potassium: 3.1 mmol/L — ABNORMAL LOW (ref 3.5–5.1)
Sodium: 135 mmol/L (ref 135–145)

## 2021-03-19 LAB — PROCALCITONIN: Procalcitonin: 8.89 ng/mL

## 2021-03-19 MED ORDER — LABETALOL HCL 5 MG/ML IV SOLN
10.0000 mg | INTRAVENOUS | Status: DC | PRN
  Administered 2021-03-19: 10 mg via INTRAVENOUS
  Administered 2021-03-19: 20 mg via INTRAVENOUS
  Filled 2021-03-19 (×2): qty 4

## 2021-03-19 MED ORDER — INSULIN ASPART 100 UNIT/ML IJ SOLN
0.0000 [IU] | Freq: Three times a day (TID) | INTRAMUSCULAR | Status: DC
  Administered 2021-03-20: 06:00:00 3 [IU] via SUBCUTANEOUS
  Administered 2021-03-21: 2 [IU] via SUBCUTANEOUS

## 2021-03-19 MED ORDER — HYDRALAZINE HCL 20 MG/ML IJ SOLN
10.0000 mg | INTRAMUSCULAR | Status: DC | PRN
Start: 2021-03-19 — End: 2021-03-20
  Administered 2021-03-19 – 2021-03-20 (×2): 20 mg via INTRAVENOUS
  Administered 2021-03-20: 06:00:00 10 mg via INTRAVENOUS
  Filled 2021-03-19 (×3): qty 1

## 2021-03-19 MED ORDER — SODIUM CHLORIDE 0.9 % IV BOLUS: 1000.0000 mL | Freq: Once | INTRAVENOUS | Status: DC

## 2021-03-19 MED ORDER — CARVEDILOL 6.25 MG PO TABS: 6.2500 mg | ORAL_TABLET | Freq: Two times a day (BID) | ORAL | Status: DC

## 2021-03-19 MED ORDER — POTASSIUM CHLORIDE CRYS ER 20 MEQ PO TBCR
40.0000 meq | EXTENDED_RELEASE_TABLET | ORAL | Status: AC
  Administered 2021-03-19 (×2): 40 meq via ORAL
  Filled 2021-03-19 (×2): qty 2

## 2021-03-19 NOTE — Progress Notes (Signed)
PROGRESS NOTE    Nathan Alvarez  [id number] DOB: [birth year] DOA: 03/17/2021 PCP: Curly Rim, MD   Brief Narrative:  Nathan Alvarez, is a 87 y.o. male with past medical history of Afib, Sick sinus syndrome s/p pacemaker, DM2, GERD, HB, HTN, HLD, chronic anemia, who presented to the [facility] with a chief complaint of hypoxia.  Reportedly patient is from assisted living facility.  Of note patient was scheduled to have pacemaker battery replaced on 12/12.   ED course was notable for a tmax of 101.7, lactic level of 3.9. The patient became hypotensive (80/50). A code sepsis was initated.  1 L IVF was given. PCR resulted as Flu A +. Blood cultures and urine cultures were obtained. The patient was started on vanc, cefepime, flagyl, tamiflu.  He was admitted to PCCM since he required vasopressors for septic shock.  He was transferred to [facility] today.   Assessment & Plan:   Principal Problem:   Influenza A Active Problems:   Pressure injury of skin   Hypotension   Septic shock (HCC)   Acute respiratory failure with hypoxia (HCC)   Lactic acidosis  Septic shock and acute hypoxic respiratory failure secondary to influenza pneumonia and possibly community-acquired pneumonia: Procalcitonin 8.89.  Came in with leukocytosis which has resolved.  Weaned off of vasopressors yesterday.  Currently comfortable on room air.  Continue Rocephin as well as Tamiflu, bronchodilators.  Lactic acidosis resolved.  Type 2 diabetes mellitus, poorly controlled with hyperglycemia: Continue SSI, hold Januvia.  History of A. fib and sick sinus syndrome s/p pacemaker: Controlled.  Coreg on hold due to low blood pressure.  Blood pressure normal but fluctuating.  We will continue to hold for another day.  Rates are controlled.  He is not on any anticoagulation, reasons unknown.  Hypokalemia: 3.1.  Will replace.  History of depression: Continue Paxil  Stage II sacrum pressure ulcer: Wound care on board.  DVT  prophylaxis: heparin injection 5,000 Units Start: 03/18/21 0600   Code Status: DNR  Family Communication:  None present at bedside.  Plan of care discussed with patient in length and he verbalized understanding and agreed with it.  Status is: Inpatient  Remains inpatient appropriate because: Needs medical management for another night and also needs SNF.  Estimated body mass index is 17.68 kg/m as calculated from the following:   Height as of this encounter: 5\' 7"  (1.702 m).   Weight as of this encounter: 51.2 kg.  Pressure Injury 01/21/21 Sacrum Lower Stage 2 -  Partial thickness loss of dermis presenting as a shallow open injury with a red, pink wound bed without slough. (Active)  01/21/21 0230  Location: Sacrum  Location Orientation: Lower  Staging: Stage 2 -  Partial thickness loss of dermis presenting as a shallow open injury with a red, pink wound bed without slough.  Wound Description (Comments):   Present on Admission: Yes     Pressure Injury 03/17/21 Buttocks Right Stage 2 -  Partial thickness loss of dermis presenting as a shallow open injury with a red, pink wound bed without slough. (Active)  03/17/21 2200  Location: Buttocks  Location Orientation: Right  Staging: Stage 2 -  Partial thickness loss of dermis presenting as a shallow open injury with a red, pink wound bed without slough.  Wound Description (Comments):   Present on Admission: Yes     Pressure Injury 03/17/21 Buttocks Left Stage 2 -  Partial thickness loss of dermis presenting as a shallow open injury with a  red, pink wound bed without slough. (Active)  03/17/21 2200  Location: Buttocks  Location Orientation: Left  Staging: Stage 2 -  Partial thickness loss of dermis presenting as a shallow open injury with a red, pink wound bed without slough.  Wound Description (Comments):   Present on Admission: Yes    Nutritional Assessment: Body mass index is 17.68 kg/m.Marland Kitchen Seen by dietician.  I agree with the  assessment and plan as outlined below: Nutrition Status:   Skin Assessment: I have examined the patient's skin and I agree with the wound assessment as performed by the wound care RN as outlined below: Pressure Injury 01/21/21 Sacrum Lower Stage 2 -  Partial thickness loss of dermis presenting as a shallow open injury with a red, pink wound bed without slough. (Active)  01/21/21 0230  Location: Sacrum  Location Orientation: Lower  Staging: Stage 2 -  Partial thickness loss of dermis presenting as a shallow open injury with a red, pink wound bed without slough.  Wound Description (Comments):   Present on Admission: Yes     Pressure Injury 03/17/21 Buttocks Right Stage 2 -  Partial thickness loss of dermis presenting as a shallow open injury with a red, pink wound bed without slough. (Active)  03/17/21 2200  Location: Buttocks  Location Orientation: Right  Staging: Stage 2 -  Partial thickness loss of dermis presenting as a shallow open injury with a red, pink wound bed without slough.  Wound Description (Comments):   Present on Admission: Yes     Pressure Injury 03/17/21 Buttocks Left Stage 2 -  Partial thickness loss of dermis presenting as a shallow open injury with a red, pink wound bed without slough. (Active)  03/17/21 2200  Location: Buttocks  Location Orientation: Left  Staging: Stage 2 -  Partial thickness loss of dermis presenting as a shallow open injury with a red, pink wound bed without slough.  Wound Description (Comments):   Present on Admission: Yes    Consultants:  None  Procedures:  None  Antimicrobials:  Anti-infectives (From admission, onward)    Start     Dose/Rate Route Frequency Ordered Stop   03/19/21 1600  vancomycin (VANCOREADY) IVPB 1250 mg/250 mL  Status:  Discontinued        1,250 mg 166.7 mL/hr over 90 Minutes Intravenous Every 48 hours 03/17/21 1559 03/18/21 0755   03/18/21 1400  cefTRIAXone (ROCEPHIN) 1 g in sodium chloride 0.9 % 100 mL IVPB         1 g 200 mL/hr over 30 Minutes Intravenous Every 24 hours 03/18/21 0755     03/18/21 0500  ceFEPIme (MAXIPIME) 2 g in sodium chloride 0.9 % 100 mL IVPB  Status:  Discontinued        2 g 200 mL/hr over 30 Minutes Intravenous Every 12 hours 03/17/21 1559 03/18/21 0755   03/17/21 2245  oseltamivir (TAMIFLU) capsule 30 mg        30 mg Oral 2 times daily 03/17/21 2152 03/22/21 2159   03/17/21 2200  oseltamivir (TAMIFLU) capsule 75 mg  Status:  Discontinued        75 mg Oral 2 times daily 03/17/21 1743 03/17/21 2151   03/17/21 1600  ceFEPIme (MAXIPIME) 2 g in sodium chloride 0.9 % 100 mL IVPB        2 g 200 mL/hr over 30 Minutes Intravenous  Once 03/17/21 1545 03/17/21 1810   03/17/21 1600  metroNIDAZOLE (FLAGYL) IVPB 500 mg  500 mg 100 mL/hr over 60 Minutes Intravenous  Once 03/17/21 1545 03/17/21 1810   03/17/21 1600  vancomycin (VANCOCIN) IVPB 1000 mg/200 mL premix        1,000 mg 200 mL/hr over 60 Minutes Intravenous  Once 03/17/21 1545 03/17/21 1810          Subjective: Seen and examined.  He is fully alert and oriented and has no complaints other than just generalized weakness.  Objective: Vitals:   03/19/21 1100 03/19/21 1102 03/19/21 1200 03/19/21 1300  BP: 94/66   121/88  Pulse: 61  74 73  Resp: (!) 25  (!) 23 (!) 29  Temp:  (!) 97.4 F (36.3 C)    TempSrc:  Oral    SpO2: 96%  92% 95%  Weight:      Height:        Intake/Output Summary (Last 24 hours) at 03/19/2021 1341 Last data filed at 03/19/2021 1100 Gross per 24 hour  Intake 1272.6 ml  Output 1275 ml  Net -2.4 ml   Filed Weights   03/17/21 2150  Weight: 51.2 kg    Examination:  General exam: Appears calm and comfortable  Respiratory system: Clear to auscultation. Respiratory effort normal. Cardiovascular system: S1 & S2 heard, RRR. No JVD, murmurs, rubs, gallops or clicks. No pedal edema. Gastrointestinal system: Abdomen is nondistended, soft and nontender. No organomegaly or masses felt.  Normal bowel sounds heard. Central nervous system: Alert and oriented. No focal neurological deficits. Extremities: Symmetric 5 x 5 power. Skin: No rashes, lesions or ulcers Psychiatry: Judgement and insight appear normal. Mood & affect appropriate.    Data Reviewed: I have personally reviewed following labs and imaging studies  CBC: Recent Labs  Lab 03/14/21 1207 03/17/21 1420 03/17/21 2340 03/19/21 0221  WBC 9.1 14.0* 9.2 8.1  NEUTROABS  --  12.9*  --   --   HGB 11.8* 12.8* 11.2* 11.8*  HCT 35.5* 39.8 34.9* 36.3*  MCV 92 96.1 96.4 94.0  PLT 250 203 179 0000000   Basic Metabolic Panel: Recent Labs  Lab 03/14/21 1207 03/17/21 1420 03/17/21 2340 03/19/21 0221  NA 136 134* 132* 135  K 4.0 4.2 3.6 3.1*  CL 99 96* 104 105  CO2 30* 28 22 20*  GLUCOSE 151* 212* 183* 131*  BUN 21 27* 26* 21  CREATININE 0.93 0.99 0.90 0.84  CALCIUM 9.0 9.1 7.7* 8.3*  MG  --   --  1.9  --   PHOS  --   --  3.7  --    GFR: Estimated Creatinine Clearance: 39.8 mL/min (by C-G formula based on SCr of 0.84 mg/dL). Liver Function Tests: Recent Labs  Lab 03/17/21 1420  AST 13*  ALT 8  ALKPHOS 75  BILITOT 0.5  PROT 6.8  ALBUMIN 3.7   No results for input(s): LIPASE, AMYLASE in the last 168 hours. No results for input(s): AMMONIA in the last 168 hours. Coagulation Profile: Recent Labs  Lab 03/17/21 1420  INR 1.1   Cardiac Enzymes: No results for input(s): CKTOTAL, CKMB, CKMBINDEX, TROPONINI in the last 168 hours. BNP (last 3 results) No results for input(s): PROBNP in the last 8760 hours. HbA1C: Recent Labs    03/17/21 2340  HGBA1C 6.0*   CBG: Recent Labs  Lab 03/18/21 1126 03/18/21 1539 03/18/21 2140 03/19/21 0757 03/19/21 1101  GLUCAP 104* 119* 116* 116* 148*   Lipid Profile: No results for input(s): CHOL, HDL, LDLCALC, TRIG, CHOLHDL, LDLDIRECT in the last 72 hours. Thyroid Function  Tests: No results for input(s): TSH, T4TOTAL, FREET4, T3FREE, THYROIDAB in the last 72  hours. Anemia Panel: No results for input(s): VITAMINB12, FOLATE, FERRITIN, TIBC, IRON, RETICCTPCT in the last 72 hours. Sepsis Labs: Recent Labs  Lab 03/17/21 1420 03/17/21 1623 03/19/21 0221  PROCALCITON  --   --  8.89  LATICACIDVEN 3.9* 2.1*  --     Recent Results (from the past 240 hour(s))  Culture, blood (Routine x 2)     Status: None (Preliminary result)   Collection Time: 03/17/21  2:20 PM   Specimen: BLOOD  Result Value Ref Range Status   Specimen Description   Final    BLOOD RIGHT ANTECUBITAL Performed at [facility], [address] [place], [place] [address]    Special Requests   Final    BOTTLES DRAWN AEROBIC AND ANAEROBIC Blood Culture adequate volume Performed at [facility], [address] [place], [place] [address]    Culture   Final    NO GROWTH 1 DAY Performed at [facility], [place] [address], [place], [place] [address]    Report Status PENDING  Incomplete  Resp Panel by RT-PCR (Flu A&B, Covid) Nasopharyngeal Swab     Status: Abnormal   Collection Time: 03/17/21  3:31 PM   Specimen: Nasopharyngeal Swab; Nasopharyngeal(NP) swabs in vial transport medium  Result Value Ref Range Status   SARS Coronavirus 2 by RT PCR NEGATIVE NEGATIVE Final    Comment: (NOTE) SARS-CoV-2 target nucleic acids are NOT DETECTED.  The SARS-CoV-2 RNA is generally detectable in upper respiratory specimens during the acute phase of infection. The lowest concentration of SARS-CoV-2 viral copies this assay can detect is 138 copies/mL. A negative result does not preclude SARS-Cov-2 infection and should not be used as the sole basis for treatment or other patient management decisions. A negative result may occur with  improper specimen collection/handling, submission of specimen other than nasopharyngeal swab, presence of viral mutation(s) within the areas targeted by this assay, and inadequate number of viral copies(<138 copies/mL). A  negative result must be combined with clinical observations, patient history, and epidemiological information. The expected result is Negative.  Fact Sheet for Patients:  EntrepreneurPulse.com.au  Fact Sheet for Healthcare Providers:  IncredibleEmployment.be  This test is no t yet approved or cleared by the [place] FDA and  has been authorized for detection and/or diagnosis of SARS-CoV-2 by FDA under an Emergency Use Authorization (EUA). This EUA will remain  in effect (meaning this test can be used) for the duration of the COVID-19 declaration under Section 564(b)(1) of the Act, 21 U.S.C.section 360bbb-3(b)(1), unless the authorization is terminated  or revoked sooner.       Influenza A by PCR POSITIVE (A) NEGATIVE Final   Influenza B by PCR NEGATIVE NEGATIVE Final    Comment: (NOTE) The Xpert Xpress SARS-CoV-2/FLU/RSV plus assay is intended as an aid in the diagnosis of influenza from Nasopharyngeal swab specimens and should not be used as a sole basis for treatment. Nasal washings and aspirates are unacceptable for Xpert Xpress SARS-CoV-2/FLU/RSV testing.  Fact Sheet for Patients: EntrepreneurPulse.com.au  Fact Sheet for Healthcare Providers: IncredibleEmployment.be  This test is not yet approved or cleared by the [place] FDA and has been authorized for detection and/or diagnosis of SARS-CoV-2 by FDA under an Emergency Use Authorization (EUA). This EUA will remain in effect (meaning this test can be used) for the duration of the COVID-19 declaration under Section 564(b)(1) of the Act, 21 U.S.C. section 360bbb-3(b)(1),  unless the authorization is terminated or revoked.  Performed at [facility], [address], [place], [place] [address]   Culture, blood (Routine x 2)     Status: None (Preliminary result)   Collection Time: 03/17/21  4:23 PM   Specimen: BLOOD LEFT  FOREARM  Result Value Ref Range Status   Specimen Description   Final    BLOOD LEFT FOREARM Performed at [facility], [place] [address] [place], [place] [address]    Special Requests   Final    BOTTLES DRAWN AEROBIC AND ANAEROBIC Blood Culture adequate volume Performed at [facility], [address], [place], [place] [address]    Culture   Final    NO GROWTH 2 DAYS Performed at [facility], [place] [address] [place], [place] [address]    Report Status PENDING  Incomplete  MRSA Next Gen by PCR, Nasal     Status: None   Collection Time: 03/17/21  9:38 PM   Specimen: Nasal Mucosa; Nasal Swab  Result Value Ref Range Status   MRSA by PCR Next Gen NOT DETECTED NOT DETECTED Final    Comment: (NOTE) The GeneXpert MRSA Assay (FDA approved for NASAL specimens only), is one component of a comprehensive MRSA colonization surveillance program. It is not intended to diagnose MRSA infection nor to guide or monitor treatment for MRSA infections. Test performance is not FDA approved in patients less than 26 years old. Performed at [facility], [place] [address] [place], [place] [address]       Radiology Studies: DG Chest Portable 1 View  Result Date: 03/17/2021 CLINICAL DATA:  Shortness of breath. EXAM: PORTABLE CHEST 1 VIEW COMPARISON:  03/03/2021 FINDINGS: Left-sided pacemaker unchanged. Lungs are somewhat hypoinflated without focal airspace consolidation or effusion. Borderline cardiomegaly unchanged. Remainder of the exam is unchanged. IMPRESSION: Hypoinflation without acute cardiopulmonary disease. Electronically Signed   By: Marin Olp M.D.   On: 03/17/2021 14:19    Scheduled Meds:  aspirin EC  81 mg Oral Q breakfast   calcium carbonate  1 tablet Oral BID WC   Chlorhexidine Gluconate Cloth  6 each Topical Q0600   ferrous sulfate  325 mg Oral Daily   Gerhardt's butt cream   Topical BID   heparin injection (subcutaneous)  5,000 Units Subcutaneous  Q8H   insulin aspart  0-15 Units Subcutaneous TID WC   insulin aspart  0-5 Units Subcutaneous QHS   mouth rinse  15 mL Mouth Rinse BID   oseltamivir  30 mg Oral BID   PARoxetine  10 mg Oral Daily   potassium chloride  40 mEq Oral Q4H   Continuous Infusions:  sodium chloride Stopped (03/18/21 1103)   sodium chloride 50 mL/hr at 03/19/21 1243   cefTRIAXone (ROCEPHIN)  IV Stopped (03/18/21 1411)     LOS: 2 days   Time spent: 35 minutes   Darliss Cheney, MD [facility]  03/19/2021, 1:41 PM  Please page via Amion and do not message via secure chat for anything urgent. Secure chat can be used for anything non urgent.  How to contact the [facility] Attending or Consulting provider [place] or covering provider during after hours [place], for this patient?  Check the care team in [facility] and look for a) attending/consulting [facility] provider listed and b) the [facility] team listed. Page or secure chat [phone]. Log into [email or web address] and use [facility]'s universal password to access. If you do not have the password, please contact  the hospital operator. Locate the [facility] provider you are looking for under [facility] and page to a number that you can be directly reached. If you still have difficulty reaching the provider, please page the [facility] (Director on Call) for the Hospitalists listed on amion for assistance.

## 2021-03-19 NOTE — Progress Notes (Signed)
Bed changed to low air loss mattress specialty bed.

## 2021-03-19 NOTE — Evaluation (Signed)
Occupational Therapy Evaluation Patient Details Name: Nathan Alvarez MRN: [id number] DOB: [birth year] Today's Date: 03/19/2021   History of Present Illness pt is a 87 y/o male presenting 12/11 with complaint of hypoxia due to infulenza pneumonia, hypotensive from sepsis and hypovolemia.  PMHx, afib, DM2, GERD, HB, sick sinus syn s/p pacemaker, HTN, HLD   Clinical Impression   Per med chart patient recently moved to [facility] (?ALF?). Prior to which patient was living alone in an apartment. Patient was ambulating with/without RW and was requiring some assist with ADLs/IADLs PTA. Patient currently functioning below baseline requiring Min to Total A grossly for bed level ADLs. Patient declined all EOB/OOB activity secondary to chronic low back pain and fatigue. Noted mild SOB with bed mobility for hygiene/clothing management as patient found to be incontinent of bowel and seemingly unaware. Patient also limited by deficits listed below including generalized weakness/debility, decreased activity tolerance, and balance deficits and would benefit from continued acute OT services in prep for safe d/c to next level of care.       Recommendations for follow up therapy are one component of a multi-disciplinary discharge planning process, led by the attending physician.  Recommendations may be updated based on patient status, additional functional criteria and insurance authorization.   Follow Up Recommendations  Skilled nursing-short term rehab (<3 hours/day)    Assistance Recommended at Discharge Frequent or constant Supervision/Assistance  Functional Status Assessment  Patient has had a recent decline in their functional status and demonstrates the ability to make significant improvements in function in a reasonable and predictable amount of time.  Equipment Recommendations  Other (comment) (Defer to next level of care.)    Recommendations for Other Services       Precautions / Restrictions  Precautions Precautions: Fall Restrictions Weight Bearing Restrictions: No      Mobility Bed Mobility Overal bed mobility: Needs Assistance Bed Mobility: Rolling Rolling: Supervision         General bed mobility comments: Supervision A for rolling R<>L in supine for hygiene/clothing management. Patient declined EOB/OOB activity this date secodnary to fatigue.    Transfers Overall transfer level: Needs assistance                 General transfer comment: Patient refused.      Balance Overall balance assessment: Needs assistance                                         ADL either performed or assessed with clinical judgement   ADL Overall ADL's : Needs assistance/impaired     Grooming: Set up;Bed level   Upper Body Bathing: Minimal assistance;Bed level   Lower Body Bathing: Total assistance;Bed level   Upper Body Dressing : Minimal assistance;Bed level   Lower Body Dressing: Total assistance;Bed level                       Vision Baseline Vision/History: 1 Wears glasses (readers) Ability to See in Adequate Light: 0 Adequate Patient Visual Report: No change from baseline Vision Assessment?: No apparent visual deficits     Perception     Praxis      Pertinent Vitals/Pain Pain Assessment: Faces Faces Pain Scale: Hurts little more Pain Location: Low back and L thigh (chronic) Pain Descriptors / Indicators: Aching;Sore Pain Intervention(s): Limited activity within patient's tolerance;Monitored during session;Repositioned     Hand Dominance  Right   Extremity/Trunk Assessment Upper Extremity Assessment Upper Extremity Assessment: Generalized weakness   Lower Extremity Assessment Lower Extremity Assessment: Defer to PT evaluation   Cervical / Trunk Assessment Cervical / Trunk Assessment: Kyphotic   Communication Communication Communication: HOH   Cognition Arousal/Alertness: Awake/alert Behavior During Therapy: WFL  for tasks assessed/performed Overall Cognitive Status: No family/caregiver present to determine baseline cognitive functioning                                 General Comments: A&Ox4; follows 1-2 step commands with good accuracy. Difficulty providing home set-up as patient recently moved from an apartment to a facility. Unsure of name of facility or level of care provided. Per med chart, patient from [facility].     General Comments  HR in 70-80's and SpO2 96% on RA. Patient mildly SOB with bed mobility. BP 140s over 80's.    Exercises     Shoulder Instructions      Home Living Family/patient expects to be discharged to:: Skilled nursing facility                                        Prior Functioning/Environment Prior Level of Function : Needs assist             Mobility Comments: Per PT eval patient walks occasionally with RW and assist, but mobilizes more at a w/c level. ADLs Comments: Requires assist for ADLs        OT Problem List: Decreased strength;Decreased range of motion;Decreased activity tolerance;Impaired balance (sitting and/or standing);Decreased knowledge of use of DME or AE      OT Treatment/Interventions: Therapeutic exercise;Self-care/ADL training;Energy conservation;DME and/or AE instruction;Therapeutic activities;Patient/family education;Balance training    OT Goals(Current goals can be found in the care plan section) Acute Rehab OT Goals Patient Stated Goal: No goals stated. OT Goal Formulation: With patient Time For Goal Achievement: 04/02/21 Potential to Achieve Goals: Good ADL Goals Pt Will Perform Grooming: standing;with min guard assist Pt Will Perform Upper Body Dressing: with set-up;sitting Pt Will Perform Lower Body Dressing: with min assist;sit to/from stand Pt Will Transfer to Toilet: with min assist;ambulating;bedside commode Pt Will Perform Toileting - Clothing Manipulation and hygiene: with min  assist;sit to/from stand Pt Will Perform Tub/Shower Transfer: with min assist;ambulating;shower seat Pt/caregiver will Perform Home Exercise Program: Increased strength;Both right and left upper extremity;Increased ROM Additional ADL Goal #1: Patient will tolerate 10 minutes of therapeutic activity without need for rest breaks in prep for ADLs.  OT Frequency: Min 2X/week   Barriers to D/C:            Co-evaluation              AM-PAC OT "6 Clicks" Daily Activity     Outcome Measure Help from another person eating meals?: A Little Help from another person taking care of personal grooming?: A Little Help from another person toileting, which includes using toliet, bedpan, or urinal?: Total Help from another person bathing (including washing, rinsing, drying)?: A Lot Help from another person to put on and taking off regular upper body clothing?: A Little Help from another person to put on and taking off regular lower body clothing?: Total 6 Click Score: 13   End of Session Nurse Communication: Mobility status  Activity Tolerance: Patient limited by fatigue;Patient limited by pain  Patient left: in bed;with call bell/phone within reach;with bed alarm set  OT Visit Diagnosis: Unsteadiness on feet (R26.81);Other abnormalities of gait and mobility (R26.89);Muscle weakness (generalized) (M62.81);Pain Pain - Right/Left: Left Pain - part of body:  (low back and thigh)                Time: 6599-3570 OT Time Calculation (min): 28 min Charges:  OT General Charges $OT Visit: 1 Visit OT Evaluation $OT Eval Moderate Complexity: 1 Mod OT Treatments $Self Care/Home Management : 8-22 mins  Josiane Labine H. OTR/L Supplemental OT, Department of rehab services [phone]  Aalayah Riles R H. 03/19/2021, 9:13 AM

## 2021-03-19 NOTE — Progress Notes (Signed)
Pt's daughter Hilda Lias notified of pt's transfer to 3E.

## 2021-03-19 NOTE — Progress Notes (Addendum)
eLink Physician-Brief Progress Note Patient Name: Nathan Alvarez DOB: [birth year] MRN: [id number]   Date of Service  03/19/2021  HPI/Events of Note  Patient with sub-optimal BP control. Patient is on Coreg at home.  eICU Interventions  PRN Hydralazine increased to 10-20 mg and PRN Labetalol added. Coreg 6.25 mg  po bid ordered.        Thomasene Lot Ltanya Bayley 03/19/2021, 12:18 AM

## 2021-03-20 ENCOUNTER — Inpatient Hospital Stay (HOSPITAL_COMMUNITY): Payer: Medicare Other

## 2021-03-20 DIAGNOSIS — I5021 Acute systolic (congestive) heart failure: Secondary | ICD-10-CM

## 2021-03-20 DIAGNOSIS — I5031 Acute diastolic (congestive) heart failure: Secondary | ICD-10-CM

## 2021-03-20 LAB — BASIC METABOLIC PANEL
Anion gap: 9 (ref 5–15)
BUN: 21 mg/dL (ref 8–23)
CO2: 20 mmol/L — ABNORMAL LOW (ref 22–32)
Calcium: 7.9 mg/dL — ABNORMAL LOW (ref 8.9–10.3)
Chloride: 105 mmol/L (ref 98–111)
Creatinine, Ser: 0.69 mg/dL (ref 0.61–1.24)
GFR, Estimated: >60 mL/min (ref >60)
Glucose, Bld: 190 mg/dL — ABNORMAL HIGH (ref 70–99)
Potassium: 3.8 mmol/L (ref 3.5–5.1)
Sodium: 134 mmol/L — ABNORMAL LOW (ref 135–145)

## 2021-03-20 LAB — ECHOCARDIOGRAM COMPLETE
AR max vel: 1.54 cm2
AV Peak grad: 5.5 mmHg
Ao pk vel: 1.17 m/s
Area-P 1/2: 3.48 cm2
Calc EF: 33.2 %
Height: 67 in
MV M vel: 5.8 m/s
MV Peak grad: 134.3 mmHg
P 1/2 time: 540 msec
S' Lateral: 3.8 cm
Single Plane A2C EF: 33.6 %
Single Plane A4C EF: 32 %
Weight: 1902.4 oz

## 2021-03-20 LAB — GLUCOSE, CAPILLARY
Glucose-Capillary: 170 mg/dL — ABNORMAL HIGH (ref 70–99)
Glucose-Capillary: 70 mg/dL (ref 70–99)
Glucose-Capillary: 116 mg/dL — ABNORMAL HIGH (ref 70–99)
Glucose-Capillary: 131 mg/dL — ABNORMAL HIGH (ref 70–99)

## 2021-03-20 MED ORDER — FUROSEMIDE 10 MG/ML IJ SOLN
20.0000 mg | Freq: Two times a day (BID) | INTRAMUSCULAR | Status: AC
  Administered 2021-03-20 (×2): 20 mg via INTRAVENOUS
  Filled 2021-03-20 (×2): qty 2

## 2021-03-20 MED ORDER — CARVEDILOL 6.25 MG PO TABS
6.2500 mg | ORAL_TABLET | Freq: Two times a day (BID) | ORAL | Status: DC
  Administered 2021-03-20 – 2021-03-22 (×5): 6.25 mg via ORAL
  Filled 2021-03-20 (×5): qty 1

## 2021-03-20 MED ORDER — HYDRALAZINE HCL 20 MG/ML IJ SOLN
10.0000 mg | Freq: Four times a day (QID) | INTRAMUSCULAR | Status: DC | PRN
  Administered 2021-03-20 – 2021-03-22 (×3): 10 mg via INTRAVENOUS
  Filled 2021-03-20 (×3): qty 1

## 2021-03-20 MED ORDER — IPRATROPIUM-ALBUTEROL 0.5-2.5 (3) MG/3ML IN SOLN: 3.0000 mL | RESPIRATORY_TRACT | Status: DC | PRN

## 2021-03-20 NOTE — Evaluation (Signed)
Clinical/Bedside Swallow Evaluation Patient Details  Name: Nathan Alvarez MRN: [id number] Date of Birth: [birth year]  Today's Date: 03/20/2021 Time: SLP Start Time (ACUTE ONLY): 1015 SLP Stop Time (ACUTE ONLY): 1050 SLP Time Calculation (min) (ACUTE ONLY): 35 min  Past Medical History:  Past Medical History:  Diagnosis Date   A-fib (HCC)    Dizziness    DM (diabetes mellitus) (HCC)    Esophagitis, reflux    Heart block    Hyperlipidemia    Hypertension, essential, benign    Memory deficit    Pathological fracture of left hip due to age-related osteoporosis (HCC) 04/25/2019   SOB (shortness of breath)    SSS (sick sinus syndrome) (HCC)    Past Surgical History:  Past Surgical History:  Procedure Laterality Date   CATARACT EXTRACTION Right    HIP ARTHROPLASTY Left 04/22/2019   Procedure: ARTHROPLASTY BIPOLAR HIP (HEMIARTHROPLASTY);  Surgeon: Myrene Galas, MD;  Location: [facility] OR;  Service: Orthopedics;  Laterality: Left;   INGUINAL HERNIA REPAIR     INSERT / REPLACE / REMOVE PACEMAKER     PACEMAKER INSERTION     TONSILLECTOMY AND ADENOIDECTOMY     HPI:  Pt is a 87 yo male prsenting from ALF with hypoxia felt to be secondary to influenza PNA. Pt was reported to have trouble swallowing on 12/14 with MD also noting poor oral hygiene. Prior clinical evaluations in September and October 2022 showed impaired mastication and lingual movement with solids, recommending thin liquids and Dys 2 to 3 solids. PMH includes: afib, DM2, GERD, HB, sick sinus syn s/p pacemaker, HTN, HLD, memory deficit    Assessment / Plan / Recommendation  Clinical Impression  Pt describes a sensation over the past month or so of food going down more slowly and needing to regurgitate, although never actually doing so. This morning he explains that while eating cheerios, he felt like he could chew the food and swallow, but that it felt "stuck" like it wouldn't go down. He consumed purees and thin liquids under direct  SLP supervision with no overt signs of oropharyngeal dysphagia. He declines any solid foods, saying he does not want anything more solid than applesauce today after events of this morning. He is interested in trying a pureed diet, so will change order to reflect that. His daughter-in-law, Nathan Alvarez, arrived at the end of the evaluation and adds that over perhaps the last few months the pt has had fluctuating PO intake, at times saying he can't swallow more solid textures but eating well at other times. Question if pt's symptoms are more esophageal in nature, and if MD may want to consider a more dedicated esophageal assessment if pt remains aversive to trying solid boluses. SLP will continue to follow acutely to see if assessment with more solid foods can be done. SLP Visit Diagnosis: Dysphagia, unspecified (R13.10)    Aspiration Risk  Risk for inadequate nutrition/hydration;Mild aspiration risk    Diet Recommendation Dysphagia 1 (Puree);Thin liquid   Liquid Administration via: Cup;Straw Medication Administration: Whole meds with liquid Supervision: Patient able to self feed;Intermittent supervision to cue for compensatory strategies Compensations: Slow rate;Small sips/bites;Follow solids with liquid Postural Changes: Seated upright at 90 degrees;Remain upright for at least 30 minutes after po intake    Other  Recommendations Oral Care Recommendations: Oral care BID    Recommendations for follow up therapy are one component of a multi-disciplinary discharge planning process, led by the attending physician.  Recommendations may be updated based on patient status, additional functional  criteria and insurance authorization.  Follow up Recommendations  (tba)      Assistance Recommended at Discharge Set up Supervision/Assistance  Functional Status Assessment Patient has had a recent decline in their functional status and demonstrates the ability to make significant improvements in function in a  reasonable and predictable amount of time.  Frequency and Duration min 2x/week  2 weeks       Prognosis Prognosis for Safe Diet Advancement: Good      Swallow Study   General HPI: Pt is a 87 yo male prsenting from ALF with hypoxia felt to be secondary to influenza PNA. Pt was reported to have trouble swallowing on 12/14 with MD also noting poor oral hygiene. Prior clinical evaluations in September and October 2022 showed impaired mastication and lingual movement with solids, recommending thin liquids and Dys 2 to 3 solids. PMH includes: afib, DM2, GERD, HB, sick sinus syn s/p pacemaker, HTN, HLD, memory deficit Type of Study: Bedside Swallow Evaluation Previous Swallow Assessment: see HPI Diet Prior to this Study: Regular;Thin liquids Temperature Spikes Noted: No Respiratory Status: Room air History of Recent Intubation: No Behavior/Cognition: Alert;Cooperative;Pleasant mood Oral Cavity Assessment: Dry Oral Care Completed by SLP: No Oral Cavity - Dentition: Adequate natural dentition;Dentures, top (partial upper dentures) Vision: Functional for self-feeding Self-Feeding Abilities: Able to feed self Patient Positioning: Upright in bed Baseline Vocal Quality: Normal Volitional Swallow: Able to elicit    Oral/Motor/Sensory Function Overall Oral Motor/Sensory Function: Within functional limits   Ice Chips Ice chips: Not tested   Thin Liquid Thin Liquid: Within functional limits Presentation: Cup;Self Fed;Straw    Nectar Thick Nectar Thick Liquid: Not tested   Honey Thick Honey Thick Liquid: Not tested   Puree Puree: Within functional limits Presentation: Spoon;Self Fed   Solid     Solid: Not tested (pt declined)      Mahala Menghini., M.A. CCC-SLP Acute Rehabilitation Services Pager [phone] Office [phone]  03/20/2021,11:27 AM

## 2021-03-20 NOTE — Progress Notes (Addendum)
PROGRESS NOTE    Nathan Alvarez  [id number] DOB: 1924/08/13 DOA: 03/17/2021 PCP: Curly Rim, MD   Brief Narrative:  Nathan Alvarez, is a 87 y.o. male with past medical history of Afib, Sick sinus syndrome s/p pacemaker, DM2, GERD, HB, HTN, HLD, chronic anemia, who presented to the [facility] with a chief complaint of hypoxia.  Reportedly patient is from assisted living facility.  Of note patient was scheduled to have pacemaker battery replaced on 12/12.   ED course was notable for a tmax of 101.7, lactic level of 3.9. The patient became hypotensive (80/50). A code sepsis was initated.  1 L IVF was given. PCR resulted as Flu A +. Blood cultures and urine cultures were obtained. The patient was started on vanc, cefepime, flagyl, tamiflu.  He was admitted to [facility] since he required vasopressors for septic shock.  He was transferred to [facility] today.   Assessment & Plan:   Principal Problem:   Influenza A Active Problems:   Pressure injury of skin   Hypotension   Septic shock (HCC)   Acute respiratory failure with hypoxia (HCC)   Lactic acidosis  Septic shock and acute hypoxic respiratory failure secondary to influenza pneumonia and possibly community-acquired pneumonia: Procalcitonin 8.89.  Came in with leukocytosis which has resolved.  Weaned off of vasopressors 03/18/2021.  Mildly tachypneic but not hypoxic on room air.  Continue Rocephin/day for IV antibiotics as well as Tamiflu, bronchodilators (was not on any, started DuoNeb as needed).  Lactic acidosis resolved.  [place] IVF.  Chest x-ray personally reviewed: Increasing density in the retrocardiac region on the left.  Small left-sided pleural effusion.  Findings suspicious for developing pneumonia.  Subtle ASD in the right lung base and mild interstitial and alveolar opacification in the right upper chest suspicious for multifocal involvement.  However I am concerned about some pulmonary edema.  IV Lasix 20 mg x 1.  Acute diastolic CHF:  Unable to appreciate 2D echo results from OSH 2018.  Check 2D echo.  Discontinued IV fluids.  IV Lasix 20 mg every 12 hours x2 doses and monitor.  Dysphagia: Reported 12/14.  Could be related to poor oral hygiene, oral care per RN-discussed with RN.  SLP for swallow eval.  Type 2 diabetes mellitus, poorly controlled with hyperglycemia: Continue SSI, hold Januvia.  Reasonably controlled in-house.  History of A. fib and sick sinus syndrome s/p pacemaker: Controlled.  Coreg was on hold due to low blood pressure.  Now hypotensive, resume beta-blockers.  He is not on any anticoagulation, reasons unknown.  Hypokalemia: 3.1.  Replaced.  History of depression: Continue Paxil  Anemia: Stable.  Stage II sacrum pressure ulcer: Wound care on board.  Body mass index is 18.62 kg/m.  Pressure Injury 01/21/21 Sacrum Lower Stage 2 -  Partial thickness loss of dermis presenting as a shallow open injury with a red, pink wound bed without slough. (Active)  01/21/21 0230  Location: Sacrum  Location Orientation: Lower  Staging: Stage 2 -  Partial thickness loss of dermis presenting as a shallow open injury with a red, pink wound bed without slough.  Wound Description (Comments):   Present on Admission: Yes     Pressure Injury 03/17/21 Buttocks Right Stage 2 -  Partial thickness loss of dermis presenting as a shallow open injury with a red, pink wound bed without slough. (Active)  03/17/21 2200  Location: Buttocks  Location Orientation: Right  Staging: Stage 2 -  Partial thickness loss of dermis presenting as a shallow open  injury with a red, pink wound bed without slough.  Wound Description (Comments):   Present on Admission: Yes     Pressure Injury 03/17/21 Buttocks Left Stage 2 -  Partial thickness loss of dermis presenting as a shallow open injury with a red, pink wound bed without slough. (Active)  03/17/21 2200  Location: Buttocks  Location Orientation: Left  Staging: Stage 2 -  Partial  thickness loss of dermis presenting as a shallow open injury with a red, pink wound bed without slough.  Wound Description (Comments):   Present on Admission: Yes      Consultants:  None  Procedures:  None  Antimicrobials:  Anti-infectives (From admission, onward)    Start     Dose/Rate Route Frequency Ordered Stop   03/19/21 1600  vancomycin (VANCOREADY) IVPB 1250 mg/250 mL  Status:  Discontinued        1,250 mg 166.7 mL/hr over 90 Minutes Intravenous Every 48 hours 03/17/21 1559 03/18/21 0755   03/18/21 1400  cefTRIAXone (ROCEPHIN) 1 g in sodium chloride 0.9 % 100 mL IVPB        1 g 200 mL/hr over 30 Minutes Intravenous Every 24 hours 03/18/21 0755     03/18/21 0500  ceFEPIme (MAXIPIME) 2 g in sodium chloride 0.9 % 100 mL IVPB  Status:  Discontinued        2 g 200 mL/hr over 30 Minutes Intravenous Every 12 hours 03/17/21 1559 03/18/21 0755   03/17/21 2245  oseltamivir (TAMIFLU) capsule 30 mg        30 mg Oral 2 times daily 03/17/21 2152 03/22/21 2159   03/17/21 2200  oseltamivir (TAMIFLU) capsule 75 mg  Status:  Discontinued        75 mg Oral 2 times daily 03/17/21 1743 03/17/21 2151   03/17/21 1600  ceFEPIme (MAXIPIME) 2 g in sodium chloride 0.9 % 100 mL IVPB        2 g 200 mL/hr over 30 Minutes Intravenous  Once 03/17/21 1545 03/17/21 1810   03/17/21 1600  metroNIDAZOLE (FLAGYL) IVPB 500 mg        500 mg 100 mL/hr over 60 Minutes Intravenous  Once 03/17/21 1545 03/17/21 1810   03/17/21 1600  vancomycin (VANCOCIN) IVPB 1000 mg/200 mL premix        1,000 mg 200 mL/hr over 60 Minutes Intravenous  Once 03/17/21 1545 03/17/21 1810        DVT prophylaxis: heparin injection 5,000 Units Start: 03/18/21 0600 Code Status: DNR  Family Communication:  None present at bedside.    Status is: Inpatient  Remains inpatient appropriate because: Severity of illness, ongoing tachypnea, acute CHF, IV Lasix and monitoring.   Subjective: Reports "not feeling good".  Trouble  swallowing, "in the mouth".  Normal thought throat pain.  Dyspnea.  Also states "flakes in the mouth".  Objective: Vitals:   03/20/21 0112 03/20/21 0113 03/20/21 0459 03/20/21 0711  BP: (!) 163/104 (!) 163/104 (!) 172/98   Pulse:  68 90   Resp: (!) 22  20   Temp:  (!) 97.4 F (36.3 C) (!) 97.5 F (36.4 C)   TempSrc:  Oral Oral   SpO2: 96% 94% 93%   Weight:    53.9 kg  Height:        Intake/Output Summary (Last 24 hours) at 03/20/2021 0835 Last data filed at 03/20/2021 0823 Gross per 24 hour  Intake 825.25 ml  Output 1175 ml  Net -349.75 ml   Filed Weights   03/19/21  1615 03/19/21 2200 03/20/21 0711  Weight: 55.2 kg 54 kg 53.9 kg    Examination:  General exam: Elderly male, well-built, poorly nourished and chronically ill looking lying uncomfortably propped up in bed without distress.  Oral cavity with exudates coating soft palate but no thrush. Respiratory system: Reduced and harsh breath sounds bilaterally with scattered few expiratory rhonchi and basal crackles.  Tachypneic but no accessory muscle use.  Left upper anterior chest permanent pacemaker.  Telemetry personally reviewed: SR with on demand V paced rhythm Cardiovascular system: S1 & S2 heard, RRR. No JVD, murmurs, rubs, gallops or clicks. No pedal edema. Gastrointestinal system: Abdomen is nondistended, soft and nontender. No organomegaly or masses felt. Normal bowel sounds heard. Central nervous system: Alert and oriented x2. No focal neurological deficits. Extremities: Symmetric 5 x 5 power. Skin: No rashes, lesions or ulcers Psychiatry: Judgement and insight appear somewhat impaired.. Mood & affect appropriate.    Data Reviewed: I have personally reviewed following labs and imaging studies  CBC: Recent Labs  Lab 03/14/21 1207 03/17/21 1420 03/17/21 2340 03/19/21 0221  WBC 9.1 14.0* 9.2 8.1  NEUTROABS  --  12.9*  --   --   HGB 11.8* 12.8* 11.2* 11.8*  HCT 35.5* 39.8 34.9* 36.3*  MCV 92 96.1 96.4 94.0   PLT 250 203 179 158   Basic Metabolic Panel: Recent Labs  Lab 03/14/21 1207 03/17/21 1420 03/17/21 2340 03/19/21 0221 03/20/21 0445  NA 136 134* 132* 135 134*  K 4.0 4.2 3.6 3.1* 3.8  CL 99 96* 104 105 105  CO2 30* 28 22 20* 20*  GLUCOSE 151* 212* 183* 131* 190*  BUN 21 27* 26* 21 21  CREATININE 0.93 0.99 0.90 0.84 0.69  CALCIUM 9.0 9.1 7.7* 8.3* 7.9*  MG  --   --  1.9  --   --   PHOS  --   --  3.7  --   --    GFR: Estimated Creatinine Clearance: 44 mL/min (by C-G formula based on SCr of 0.69 mg/dL). Liver Function Tests: Recent Labs  Lab 03/17/21 1420  AST 13*  ALT 8  ALKPHOS 75  BILITOT 0.5  PROT 6.8  ALBUMIN 3.7    Coagulation Profile: Recent Labs  Lab 03/17/21 1420  INR 1.1    HbA1C: Recent Labs    03/17/21 2340  HGBA1C 6.0*   CBG: Recent Labs  Lab 03/19/21 0757 03/19/21 1101 03/19/21 1745 03/19/21 2111 03/20/21 0601  GLUCAP 116* 148* 76 132* 170*    Sepsis Labs: Recent Labs  Lab 03/17/21 1420 03/17/21 1623 03/19/21 0221  PROCALCITON  --   --  8.89  LATICACIDVEN 3.9* 2.1*  --     Recent Results (from the past 240 hour(s))  Culture, blood (Routine x 2)     Status: None (Preliminary result)   Collection Time: 03/17/21  2:20 PM   Specimen: BLOOD  Result Value Ref Range Status   Specimen Description   Final    BLOOD RIGHT ANTECUBITAL Performed at [facility], [address], [place], [place] [address]    Special Requests   Final    BOTTLES DRAWN AEROBIC AND ANAEROBIC Blood Culture adequate volume Performed at [facility], [address], [place], [place] [address]    Culture   Final    NO GROWTH 1 DAY Performed at [facility], [address], [place], [place] [address]    Report Status PENDING  Incomplete  Resp Panel by RT-PCR (Flu  A&B, Covid) Nasopharyngeal Swab     Status: Abnormal   Collection Time: 03/17/21  3:31 PM   Specimen: Nasopharyngeal Swab; Nasopharyngeal(NP) swabs  in vial transport medium  Result Value Ref Range Status   SARS Coronavirus 2 by RT PCR NEGATIVE NEGATIVE Final    Comment: (NOTE) SARS-CoV-2 target nucleic acids are NOT DETECTED.  The SARS-CoV-2 RNA is generally detectable in upper respiratory specimens during the acute phase of infection. The lowest concentration of SARS-CoV-2 viral copies this assay can detect is 138 copies/mL. A negative result does not preclude SARS-Cov-2 infection and should not be used as the sole basis for treatment or other patient management decisions. A negative result may occur with  improper specimen collection/handling, submission of specimen other than nasopharyngeal swab, presence of viral mutation(s) within the areas targeted by this assay, and inadequate number of viral copies(<138 copies/mL). A negative result must be combined with clinical observations, patient history, and epidemiological information. The expected result is Negative.  Fact Sheet for Patients:  BloggerCourse.comhttps://www.fda.gov/media/152166/download  Fact Sheet for Healthcare Providers:  SeriousBroker.ithttps://www.fda.gov/media/152162/download  This test is no t yet approved or cleared by the [place] FDA and  has been authorized for detection and/or diagnosis of SARS-CoV-2 by FDA under an Emergency Use Authorization (EUA). This EUA will remain  in effect (meaning this test can be used) for the duration of the COVID-19 declaration under Section 564(b)(1) of the Act, 21 U.S.C.section 360bbb-3(b)(1), unless the authorization is terminated  or revoked sooner.       Influenza A by PCR POSITIVE (A) NEGATIVE Final   Influenza B by PCR NEGATIVE NEGATIVE Final    Comment: (NOTE) The Xpert Xpress SARS-CoV-2/FLU/RSV plus assay is intended as an aid in the diagnosis of influenza from Nasopharyngeal swab specimens and should not be used as a sole basis for treatment. Nasal washings and aspirates are unacceptable for Xpert Xpress  SARS-CoV-2/FLU/RSV testing.  Fact Sheet for Patients: BloggerCourse.comhttps://www.fda.gov/media/152166/download  Fact Sheet for Healthcare Providers: SeriousBroker.ithttps://www.fda.gov/media/152162/download  This test is not yet approved or cleared by the [place] FDA and has been authorized for detection and/or diagnosis of SARS-CoV-2 by FDA under an Emergency Use Authorization (EUA). This EUA will remain in effect (meaning this test can be used) for the duration of the COVID-19 declaration under Section 564(b)(1) of the Act, 21 U.S.C. section 360bbb-3(b)(1), unless the authorization is terminated or revoked.  Performed at [facility], [address], [place], [place] [address]   Culture, blood (Routine x 2)     Status: None (Preliminary result)   Collection Time: 03/17/21  4:23 PM   Specimen: BLOOD LEFT FOREARM  Result Value Ref Range Status   Specimen Description   Final    BLOOD LEFT FOREARM Performed at [facility], [address] [place], [place] [address]    Special Requests   Final    BOTTLES DRAWN AEROBIC AND ANAEROBIC Blood Culture adequate volume Performed at [facility], [address], [place], [place] [address]    Culture   Final    NO GROWTH 2 DAYS Performed at [facility], [address] [place], [place] [address]    Report Status PENDING  Incomplete  MRSA Next Gen by PCR, Nasal     Status: None   Collection Time: 03/17/21  9:38 PM   Specimen: Nasal Mucosa; Nasal Swab  Result Value Ref Range Status   MRSA by PCR Next Gen NOT DETECTED NOT DETECTED Final    Comment: (NOTE) The  GeneXpert MRSA Assay (FDA approved for NASAL specimens only), is one component of a comprehensive MRSA colonization surveillance program. It is not intended to diagnose MRSA infection nor to guide or monitor treatment for MRSA infections. Test performance is not FDA approved in patients less than 49 years old. Performed at [facility],  [place] [address] [place], [place] [address]       Radiology Studies: DG Chest 2 View  Result Date: 03/20/2021 CLINICAL DATA:  Shortness of breath, history of flu in a 87 year old male. EXAM: CHEST - 2 VIEW COMPARISON:  March 17, 2021. FINDINGS: LEFT-sided dual lead pacer device remains in place. EKG leads project over the chest. Cardiomediastinal contours and hilar structures are stable. Increasing density in the retrocardiac region on the LEFT. Small LEFT-sided pleural effusion associated with this finding. No signs of frank consolidation in the RIGHT chest. Perhaps subtle airspace disease at the RIGHT lung base since previous imaging, also in the RIGHT upper lobe as well. On limited assessment there is no acute skeletal process. IMPRESSION: Increasing density in the retrocardiac region on the LEFT. Small LEFT-sided pleural effusion associated with this finding. Findings suspicious for developing pneumonia. Subtle airspace disease the RIGHT lung base and mild interstitial and alveolar opacification in the RIGHT upper chest suspicious for multifocal involvement. Electronically Signed   By: Zetta Bills M.D.   On: 03/20/2021 08:20    Scheduled Meds:  aspirin EC  81 mg Oral Q breakfast   calcium carbonate  1 tablet Oral BID WC   ferrous sulfate  325 mg Oral Daily   Gerhardt's butt cream   Topical BID   heparin injection (subcutaneous)  5,000 Units Subcutaneous Q8H   insulin aspart  0-15 Units Subcutaneous TID WC   insulin aspart  0-5 Units Subcutaneous QHS   mouth rinse  15 mL Mouth Rinse BID   oseltamivir  30 mg Oral BID   PARoxetine  10 mg Oral Daily   Continuous Infusions:  sodium chloride Stopped (03/18/21 1103)   cefTRIAXone (ROCEPHIN)  IV 1 g (03/19/21 1445)     LOS: 3 days   Vernell Leep, MD,  FACP, [facility], [facility], [facility] (Care Management Physician Certified) [place]  To contact the attending provider between 7A-7P or the covering provider  during after hours 7P-7A, please log into the web site [email or web address] and access using universal [place] password for that web site. If you do not have the password, please call the hospital operator.

## 2021-03-20 NOTE — Plan of Care (Signed)

## 2021-03-20 NOTE — Social Work (Signed)
CSW has acknowledge pt Snf consult, CSW spoke with pt daughter who informed CSW that pt is a LTC resident at [facility] and she would like him to return CSW will follow up with [facility] in the morning on pt DC plan.

## 2021-03-20 NOTE — Progress Notes (Signed)
Physical Therapy Treatment Patient Details Name: Nathan Alvarez MRN: [id number] DOB: May 06, 1924 Today's Date: 03/20/2021   History of Present Illness pt is a 87 y/o male presenting 12/11 with complaint of hypoxia due to infulenza pneumonia, hypotensive from sepsis and hypovolemia.  PMHx, afib, DM2, GERD, HB, sick sinus syn s/p pacemaker, HTN, HLD    PT Comments    Pt is making good progress with mobility, ambulating up to ~18 ft one bout with a RW and minA today. However, when not using the RW he appears to get anxious, and as a result leans posteriorly and needs up to maxA to direct his buttocks with transfers between surfaces. Practiced multiple standing, marching, and ambulating anterior <> posterior bouts to improve pt's confidence with standing mobility. Finished session with standing and sitting lower extremity exercises. Will continue to follow acutely. Current recommendations remain appropriate.    Recommendations for follow up therapy are one component of a multi-disciplinary discharge planning process, led by the attending physician.  Recommendations may be updated based on patient status, additional functional criteria and insurance authorization.  Follow Up Recommendations  Skilled nursing-short term rehab (<3 hours/day)     Assistance Recommended at Discharge Intermittent Supervision/Assistance  Equipment Recommendations  Other (comment) (TBA)    Recommendations for Other Services       Precautions / Restrictions Precautions Precautions: Fall Precaution Comments: HOH Restrictions Weight Bearing Restrictions: No     Mobility  Bed Mobility Overal bed mobility: Needs Assistance Bed Mobility: Supine to Sit     Supine to sit: Min assist;HOB elevated     General bed mobility comments: Cues to bring legs off EOB and to push off L elbow to ascend trunk, minA to complete trunk ascension and scoot hips to EOB.    Transfers Overall transfer level: Needs  assistance Equipment used: 1 person hand held assist;Rolling walker (2 wheels) Transfers: Sit to/from Stand;Bed to chair/wheelchair/BSC Sit to Stand: Mod assist;Min assist     Step pivot transfers: Max assist     General transfer comment: Cues to push up from current sitting surface. Pt coming to stand from EOB to PT with anterior approach with modA due to posterior lean. Cued pt to transition L hand from bed rail to recliner towards his L as attempted stand step transfer, but pt became more anxious leaning posteriorly needing maxA to direct buttocks to chair. Less anxiety noted when using RW, only needing minA to come to stand x2 reps from recliner.    Ambulation/Gait Ambulation/Gait assistance: Min assist;Max assist Gait Distance (Feet): 18 Feet (x3 bouts of ~1 ft > ~6 ft > ~18 ft) Assistive device: Rolling walker (2 wheels);1 person hand held assist Gait Pattern/deviations: Step-through pattern;Decreased stride length;Shuffle;Trunk flexed Gait velocity: reduced Gait velocity interpretation: <1.31 ft/sec, indicative of household ambulator   General Gait Details: During first bout, pt side stepping to L bed > recliner with HHA, but leaning posteriorly, needing maxA to safely direct buttocks to chair. During additional 2 bouts, used RW with decreased anxiety noted as he only required minA for stability. Pt taking several steps anterior <> posterior repeatedly, displaying shuffling, slow gait.   Stairs             Wheelchair Mobility    Modified Rankin (Stroke Patients Only)       Balance Overall balance assessment: Needs assistance Sitting-balance support: Feet supported;Single extremity supported;Bilateral upper extremity supported Sitting balance-Leahy Scale: Poor Sitting balance - Comments: reliant on at least 1 UE support. Postural control: Posterior  lean Standing balance support: During functional activity;Bilateral upper extremity supported Standing balance-Leahy  Scale: Poor Standing balance comment: reliant on external support                            Cognition Arousal/Alertness: Awake/alert Behavior During Therapy: WFL for tasks assessed/performed Overall Cognitive Status: Difficult to assess                                 General Comments: Pt HOH, normally wears hearing aids per daughter. Per daughter, she has to direct him with transfers often. Functional Status Assessment: Patient has had a recent decline in their functional status and demonstrates the ability to make significant improvements in function in a reasonable and predictable amount of time.      Exercises General Exercises - Lower Extremity Long Arc Quad: Strengthening;AROM;Both;10 reps;Seated (with eccentric control) Hip ABduction/ADduction: Strengthening;Both;10 reps;Seated (pillow squeezes) Hip Flexion/Marching: Strengthening;AROM;Both;10 reps;Seated;Standing (x10 sitting and x10 standing with RW) Mini-Sqauts: Strengthening;Both;10 reps;Standing (with RW)    General Comments General comments (skin integrity, edema, etc.): encouraged pt to get OOB each meal and perform seated exercises      Pertinent Vitals/Pain Pain Assessment: Faces Faces Pain Scale: Hurts a little bit Pain Location: generalized grimacing, did not specify when asked Pain Descriptors / Indicators: Grimacing Pain Intervention(s): Monitored during session;Limited activity within patient's tolerance;Repositioned    Home Living                          Prior Function            PT Goals (current goals can now be found in the care plan section) Acute Rehab PT Goals Patient Stated Goal: to improve PT Goal Formulation: With patient/family Time For Goal Achievement: 04/01/21 Potential to Achieve Goals: Good Progress towards PT goals: Progressing toward goals    Frequency    Min 3X/week      PT Plan Current plan remains appropriate    Co-evaluation               AM-PAC PT "6 Clicks" Mobility   Outcome Measure  Help needed turning from your back to your side while in a flat bed without using bedrails?: A Little Help needed moving from lying on your back to sitting on the side of a flat bed without using bedrails?: A Little Help needed moving to and from a bed to a chair (including a wheelchair)?: A Lot Help needed standing up from a chair using your arms (e.g., wheelchair or bedside chair)?: A Little Help needed to walk in hospital room?: A Lot Help needed climbing 3-5 steps with a railing? : Total 6 Click Score: 14    End of Session Equipment Utilized During Treatment: Gait belt Activity Tolerance: Patient tolerated treatment well Patient left: with call bell/phone within reach;in chair;with chair alarm set;with family/visitor present Nurse Communication: Mobility status (NT) PT Visit Diagnosis: Other abnormalities of gait and mobility (R26.89);Muscle weakness (generalized) (M62.81);Difficulty in walking, not elsewhere classified (R26.2);Unsteadiness on feet (R26.81)     Time: [id number] PT Time Calculation (min) (ACUTE ONLY): 26 min  Charges:  $Gait Training: 8-22 mins $Therapeutic Exercise: 8-22 mins                     Moishe Spice, PT, DPT Acute Rehabilitation Services  Pager: [phone] Office: [phone]  Maretta Bees Pettis 03/20/2021, 12:43 PM

## 2021-03-21 ENCOUNTER — Inpatient Hospital Stay (HOSPITAL_COMMUNITY): Payer: Medicare Other

## 2021-03-21 DIAGNOSIS — Z950 Presence of cardiac pacemaker: Secondary | ICD-10-CM

## 2021-03-21 DIAGNOSIS — R933 Abnormal findings on diagnostic imaging of other parts of digestive tract: Secondary | ICD-10-CM

## 2021-03-21 DIAGNOSIS — I5021 Acute systolic (congestive) heart failure: Secondary | ICD-10-CM

## 2021-03-21 DIAGNOSIS — R1310 Dysphagia, unspecified: Secondary | ICD-10-CM

## 2021-03-21 DIAGNOSIS — I10 Essential (primary) hypertension: Secondary | ICD-10-CM

## 2021-03-21 DIAGNOSIS — J9601 Acute respiratory failure with hypoxia: Secondary | ICD-10-CM

## 2021-03-21 LAB — BASIC METABOLIC PANEL
Anion gap: 9 (ref 5–15)
BUN: 19 mg/dL (ref 8–23)
CO2: 24 mmol/L (ref 22–32)
Calcium: 8.1 mg/dL — ABNORMAL LOW (ref 8.9–10.3)
Chloride: 102 mmol/L (ref 98–111)
Creatinine, Ser: 0.89 mg/dL (ref 0.61–1.24)
GFR, Estimated: >60 mL/min (ref >60)
Glucose, Bld: 153 mg/dL — ABNORMAL HIGH (ref 70–99)
Potassium: 3.4 mmol/L — ABNORMAL LOW (ref 3.5–5.1)
Sodium: 135 mmol/L (ref 135–145)

## 2021-03-21 LAB — GLUCOSE, CAPILLARY
Glucose-Capillary: 123 mg/dL — ABNORMAL HIGH (ref 70–99)
Glucose-Capillary: 72 mg/dL (ref 70–99)
Glucose-Capillary: 96 mg/dL (ref 70–99)
Glucose-Capillary: 104 mg/dL — ABNORMAL HIGH (ref 70–99)

## 2021-03-21 MED ORDER — LOSARTAN POTASSIUM 25 MG PO TABS
25.0000 mg | ORAL_TABLET | Freq: Every day | ORAL | Status: DC
  Administered 2021-03-21 – 2021-03-22 (×2): 25 mg via ORAL
  Filled 2021-03-21 (×2): qty 1

## 2021-03-21 MED ORDER — POTASSIUM CHLORIDE CRYS ER 20 MEQ PO TBCR
40.0000 meq | EXTENDED_RELEASE_TABLET | Freq: Once | ORAL | Status: AC
  Administered 2021-03-21: 40 meq via ORAL
  Filled 2021-03-21: qty 2

## 2021-03-21 NOTE — Plan of Care (Signed)
  Problem: Health Behavior/Discharge Planning: Goal: Ability to manage health-related needs will improve Outcome: Progressing   Problem: Activity: Goal: Risk for activity intolerance will decrease Outcome: Progressing   Problem: Nutrition: Goal: Adequate nutrition will be maintained Outcome: Not Progressing   

## 2021-03-21 NOTE — Progress Notes (Signed)
Patient arrived back onto the unit from his esophagram scan. Patient was made NPO since midnight for scan. AM meds held until scan results.

## 2021-03-21 NOTE — Care Management Important Message (Signed)
Important Message  Patient Details  Name: Nathan Alvarez MRN: [id number] Date of Birth: Jul 15, 1924   Medicare Important Message Given:  Yes     Renie Ora 03/21/2021, 9:22 AM

## 2021-03-21 NOTE — Progress Notes (Addendum)
PROGRESS NOTE    Prabhdeep Kirker  [id number] DOB: Aug 21, 1924 DOA: 03/17/2021 PCP: Curly Rim, MD   Brief Narrative:  Haydee Salter, is a 87 y.o. male with past medical history of Afib, Sick sinus syndrome s/p pacemaker, DM2, GERD, HB, HTN, HLD, chronic anemia, who presented to the [facility] with a chief complaint of hypoxia.  Reportedly patient is from assisted living facility.  Of note patient was scheduled to have pacemaker battery replaced on 12/12.   ED course was notable for a tmax of 101.7, lactic level of 3.9. The patient became hypotensive (80/50). A code sepsis was initated.  1 L IVF was given. PCR resulted as Flu A +. Blood cultures and urine cultures were obtained. The patient was started on vanc, cefepime, flagyl, tamiflu.  He was admitted to PCCM since he required vasopressors for septic shock.  He was transferred to [facility].   Assessment & Plan:   Principal Problem:   Influenza A Active Problems:   Pressure injury of skin   Hypotension   Septic shock (HCC)   Acute respiratory failure with hypoxia (HCC)   Lactic acidosis  Septic shock and acute hypoxic respiratory failure secondary to influenza pneumonia and possibly community-acquired pneumonia: Procalcitonin 8.89.  Came in with leukocytosis which has resolved.  Weaned off of vasopressors 03/18/2021.  Mildly tachypneic but not hypoxic on room air.  Continue Rocephin/day for IV antibiotics as well as Tamiflu, bronchodilators (was not on any, started DuoNeb as needed).  Lactic acidosis resolved.  Nowata IVF.  Chest x-ray 12/14 personally reviewed: Increasing density in the retrocardiac region on the left.  Small left-sided pleural effusion.  Findings suspicious for developing pneumonia.  Subtle ASD in the right lung base and mild interstitial and alveolar opacification in the right upper chest suspicious for multifocal involvement.  However I am concerned about some pulmonary edema.  IV Lasix 20 mg x 2.  Respiratory status much  improved  New acute systolic CHF/new cardiomyopathy: Unable to appreciate 2D echo results from OSH 2018.  2D echo 12/14: LVEF 35-40%, lateral wall hypokinesis and moderate LVH.  Discontinued IV fluids.  IV Lasix 20 mg every 12 hours x2 doses.  -3.1 L in the last 24 hours.  Volume status much improved and appears euvolemic.  Will defer further diuretic management to cardiology.  Cardiology consulted.  Essential hypertension: Continue carvedilol 6.25 mg twice daily.  Added losartan 25 Mg daily.  As needed IV hydralazine.  Dysphagia: Reported 12/14.  Could be related to poor oral hygiene, oral care per RN-discussed with RN.  SLP input appreciated, suspecting esophageal cause, getting barium swallow.  Dysphagia 1 diet and thin liquids recommended.  Addendum: Barium swallow suggested esophageal dysmotility versus unable to rule out stricture in the distal esophagus.  Per GI and their input appreciated.  They discussed with patient/family were insistent on no aggressive evaluation i.e. EGD.  They indicate he likely has severe dysmotility and recommend soft diet.  Type 2 diabetes mellitus, poorly controlled with hyperglycemia: Continue SSI, hold Januvia.  Reasonably controlled in-house.  History of A. fib and sick sinus syndrome s/p pacemaker: Controlled.  Coreg was on hold due to low blood pressure.  Now hypotensive, resume beta-blockers.  He is not on any anticoagulation, reasons unknown.  Hypokalemia: Continue to replace and follow.  History of depression: Continue Paxil  Anemia: Stable.  Body mass index is 17.59 kg/m.  Pressure Injury 01/21/21 Sacrum Lower Stage 2 -  Partial thickness loss of dermis presenting as a shallow open  injury with a red, pink wound bed without slough. (Active)  01/21/21 0230  Location: Sacrum  Location Orientation: Lower  Staging: Stage 2 -  Partial thickness loss of dermis presenting as a shallow open injury with a red, pink wound bed without slough.  Wound  Description (Comments):   Present on Admission: Yes     Pressure Injury 03/17/21 Buttocks Right Stage 2 -  Partial thickness loss of dermis presenting as a shallow open injury with a red, pink wound bed without slough. (Active)  03/17/21 2200  Location: Buttocks  Location Orientation: Right  Staging: Stage 2 -  Partial thickness loss of dermis presenting as a shallow open injury with a red, pink wound bed without slough.  Wound Description (Comments):   Present on Admission: Yes     Pressure Injury 03/17/21 Buttocks Left Stage 2 -  Partial thickness loss of dermis presenting as a shallow open injury with a red, pink wound bed without slough. (Active)  03/17/21 2200  Location: Buttocks  Location Orientation: Left  Staging: Stage 2 -  Partial thickness loss of dermis presenting as a shallow open injury with a red, pink wound bed without slough.  Wound Description (Comments):   Present on Admission: Yes      Consultants:  Cardiology  Procedures:  None  Antimicrobials:  Anti-infectives (From admission, onward)    Start     Dose/Rate Route Frequency Ordered Stop   03/19/21 1600  vancomycin (VANCOREADY) IVPB 1250 mg/250 mL  Status:  Discontinued        1,250 mg 166.7 mL/hr over 90 Minutes Intravenous Every 48 hours 03/17/21 1559 03/18/21 0755   03/18/21 1400  cefTRIAXone (ROCEPHIN) 1 g in sodium chloride 0.9 % 100 mL IVPB        1 g 200 mL/hr over 30 Minutes Intravenous Every 24 hours 03/18/21 0755     03/18/21 0500  ceFEPIme (MAXIPIME) 2 g in sodium chloride 0.9 % 100 mL IVPB  Status:  Discontinued        2 g 200 mL/hr over 30 Minutes Intravenous Every 12 hours 03/17/21 1559 03/18/21 0755   03/17/21 2245  oseltamivir (TAMIFLU) capsule 30 mg        30 mg Oral 2 times daily 03/17/21 2152 03/22/21 2159   03/17/21 2200  oseltamivir (TAMIFLU) capsule 75 mg  Status:  Discontinued        75 mg Oral 2 times daily 03/17/21 1743 03/17/21 2151   03/17/21 1600  ceFEPIme (MAXIPIME) 2 g in  sodium chloride 0.9 % 100 mL IVPB        2 g 200 mL/hr over 30 Minutes Intravenous  Once 03/17/21 1545 03/17/21 1810   03/17/21 1600  metroNIDAZOLE (FLAGYL) IVPB 500 mg        500 mg 100 mL/hr over 60 Minutes Intravenous  Once 03/17/21 1545 03/17/21 1810   03/17/21 1600  vancomycin (VANCOCIN) IVPB 1000 mg/200 mL premix        1,000 mg 200 mL/hr over 60 Minutes Intravenous  Once 03/17/21 1545 03/17/21 1810        DVT prophylaxis: heparin injection 5,000 Units Start: 03/18/21 0600 Code Status: DNR  Family Communication:  None present at bedside.    Status is: Inpatient  Remains inpatient appropriate because: Severity of illness, ongoing tachypnea, acute CHF, IV Lasix and monitoring.   Subjective: States that his breathing is better.  Swallowing is "okay".  Not very communicative this morning.  Still sleepy.  As per RN, did  much better in terms of breathing after IV Lasix yesterday.  Objective: Vitals:   03/21/21 0453 03/21/21 0454 03/21/21 0509 03/21/21 0659  BP: (!) 191/96   (!) 157/94  Pulse: 83 83    Resp: 18     Temp: 97.6 F (36.4 C) 97.6 F (36.4 C)    TempSrc: Oral Oral    SpO2: 95%   95%  Weight:   50.9 kg   Height:        Intake/Output Summary (Last 24 hours) at 03/21/2021 0901 Last data filed at 03/21/2021 0555 Gross per 24 hour  Intake 320 ml  Output 3225 ml  Net -2905 ml   Filed Weights   03/19/21 2200 03/20/21 0711 03/21/21 0509  Weight: 54 kg 53.9 kg 50.9 kg    Examination:  General exam: Elderly male, well-built, poorly nourished and chronically ill looking lying comfortably propped up in bed without distress. Respiratory system: Much improved.  Mostly clear to auscultation except occasional crackles in the left base.  No wheezing, rhonchi.  No increased work of breathing.  Left upper anterior chest permanent pacemaker.   Cardiovascular system: S1 and S2 heard, RRR.  No JVD, murmurs or pedal edema. Gastrointestinal system: Abdomen is nondistended,  soft and nontender. No organomegaly or masses felt. Normal bowel sounds heard. Central nervous system: Alert and oriented x2. No focal neurological deficits. Extremities: Symmetric 5 x 5 power. Skin: No rashes, lesions or ulcers Psychiatry: Judgement and insight appear somewhat impaired.. Mood & affect appropriate.    Data Reviewed: I have personally reviewed following labs and imaging studies  CBC: Recent Labs  Lab 03/14/21 1207 03/17/21 1420 03/17/21 2340 03/19/21 0221  WBC 9.1 14.0* 9.2 8.1  NEUTROABS  --  12.9*  --   --   HGB 11.8* 12.8* 11.2* 11.8*  HCT 35.5* 39.8 34.9* 36.3*  MCV 92 96.1 96.4 94.0  PLT 250 203 179 0000000   Basic Metabolic Panel: Recent Labs  Lab 03/17/21 1420 03/17/21 2340 03/19/21 0221 03/20/21 0445 03/21/21 0216  NA 134* 132* 135 134* 135  K 4.2 3.6 3.1* 3.8 3.4*  CL 96* 104 105 105 102  CO2 28 22 20* 20* 24  GLUCOSE 212* 183* 131* 190* 153*  BUN 27* 26* 21 21 19   CREATININE 0.99 0.90 0.84 0.69 0.89  CALCIUM 9.1 7.7* 8.3* 7.9* 8.1*  MG  --  1.9  --   --   --   PHOS  --  3.7  --   --   --    GFR: Estimated Creatinine Clearance: 37.3 mL/min (by C-G formula based on SCr of 0.89 mg/dL). Liver Function Tests: Recent Labs  Lab 03/17/21 1420  AST 13*  ALT 8  ALKPHOS 75  BILITOT 0.5  PROT 6.8  ALBUMIN 3.7    Coagulation Profile: Recent Labs  Lab 03/17/21 1420  INR 1.1    HbA1C: No results for input(s): HGBA1C in the last 72 hours.  CBG: Recent Labs  Lab 03/20/21 0601 03/20/21 1059 03/20/21 1614 03/20/21 2136 03/21/21 0630  GLUCAP 170* 70 116* 131* 123*    Sepsis Labs: Recent Labs  Lab 03/17/21 1420 03/17/21 1623 03/19/21 0221  PROCALCITON  --   --  8.89  LATICACIDVEN 3.9* 2.1*  --     Recent Results (from the past 240 hour(s))  Culture, blood (Routine x 2)     Status: None (Preliminary result)   Collection Time: 03/17/21  2:20 PM   Specimen: BLOOD  Result Value Ref Range  Status   Specimen Description   Final     BLOOD RIGHT ANTECUBITAL Performed at [facility], [address], [place], [place] [address]    Special Requests   Final    BOTTLES DRAWN AEROBIC AND ANAEROBIC Blood Culture adequate volume Performed at [facility], [address], [place], [place] [address]    Culture   Final    NO GROWTH 3 DAYS Performed at [facility], [place] [address], [place], [place] [address]    Report Status PENDING  Incomplete  Resp Panel by RT-PCR (Flu A&B, Covid) Nasopharyngeal Swab     Status: Abnormal   Collection Time: 03/17/21  3:31 PM   Specimen: Nasopharyngeal Swab; Nasopharyngeal(NP) swabs in vial transport medium  Result Value Ref Range Status   SARS Coronavirus 2 by RT PCR NEGATIVE NEGATIVE Final    Comment: (NOTE) SARS-CoV-2 target nucleic acids are NOT DETECTED.  The SARS-CoV-2 RNA is generally detectable in upper respiratory specimens during the acute phase of infection. The lowest concentration of SARS-CoV-2 viral copies this assay can detect is 138 copies/mL. A negative result does not preclude SARS-Cov-2 infection and should not be used as the sole basis for treatment or other patient management decisions. A negative result may occur with  improper specimen collection/handling, submission of specimen other than nasopharyngeal swab, presence of viral mutation(s) within the areas targeted by this assay, and inadequate number of viral copies(<138 copies/mL). A negative result must be combined with clinical observations, patient history, and epidemiological information. The expected result is Negative.  Fact Sheet for Patients:  EntrepreneurPulse.com.au  Fact Sheet for Healthcare Providers:  IncredibleEmployment.be  This test is no t yet approved or cleared by the [place] FDA and  has been authorized for detection and/or diagnosis of SARS-CoV-2 by FDA under an Emergency Use Authorization (EUA). This  EUA will remain  in effect (meaning this test can be used) for the duration of the COVID-19 declaration under Section 564(b)(1) of the Act, 21 U.S.C.section 360bbb-3(b)(1), unless the authorization is terminated  or revoked sooner.       Influenza A by PCR POSITIVE (A) NEGATIVE Final   Influenza B by PCR NEGATIVE NEGATIVE Final    Comment: (NOTE) The Xpert Xpress SARS-CoV-2/FLU/RSV plus assay is intended as an aid in the diagnosis of influenza from Nasopharyngeal swab specimens and should not be used as a sole basis for treatment. Nasal washings and aspirates are unacceptable for Xpert Xpress SARS-CoV-2/FLU/RSV testing.  Fact Sheet for Patients: EntrepreneurPulse.com.au  Fact Sheet for Healthcare Providers: IncredibleEmployment.be  This test is not yet approved or cleared by the [place] FDA and has been authorized for detection and/or diagnosis of SARS-CoV-2 by FDA under an Emergency Use Authorization (EUA). This EUA will remain in effect (meaning this test can be used) for the duration of the COVID-19 declaration under Section 564(b)(1) of the Act, 21 U.S.C. section 360bbb-3(b)(1), unless the authorization is terminated or revoked.  Performed at [facility], [address], [place], [place] [address]   Culture, blood (Routine x 2)     Status: None (Preliminary result)   Collection Time: 03/17/21  4:23 PM   Specimen: BLOOD LEFT FOREARM  Result Value Ref Range Status   Specimen Description   Final    BLOOD LEFT FOREARM Performed at [facility], [place] [address] [place],  [address]    Special Requests   Final    BOTTLES DRAWN AEROBIC AND ANAEROBIC Blood Culture adequate volume  Performed at [facility], [address] [place], [place] [address]    Culture   Final    NO GROWTH 4 DAYS Performed at [facility], [place] [address] [place], [place] [address]    Report Status  PENDING  Incomplete  MRSA Next Gen by PCR, Nasal     Status: None   Collection Time: 03/17/21  9:38 PM   Specimen: Nasal Mucosa; Nasal Swab  Result Value Ref Range Status   MRSA by PCR Next Gen NOT DETECTED NOT DETECTED Final    Comment: (NOTE) The GeneXpert MRSA Assay (FDA approved for NASAL specimens only), is one component of a comprehensive MRSA colonization surveillance program. It is not intended to diagnose MRSA infection nor to guide or monitor treatment for MRSA infections. Test performance is not FDA approved in patients less than 38 years old. Performed at [facility], [place] [address] [place], [place] [address]       Radiology Studies: DG Chest 2 View  Result Date: 03/20/2021 CLINICAL DATA:  Shortness of breath, history of flu in a 87 year old male. EXAM: CHEST - 2 VIEW COMPARISON:  March 17, 2021. FINDINGS: LEFT-sided dual lead pacer device remains in place. EKG leads project over the chest. Cardiomediastinal contours and hilar structures are stable. Increasing density in the retrocardiac region on the LEFT. Small LEFT-sided pleural effusion associated with this finding. No signs of frank consolidation in the RIGHT chest. Perhaps subtle airspace disease at the RIGHT lung base since previous imaging, also in the RIGHT upper lobe as well. On limited assessment there is no acute skeletal process. IMPRESSION: Increasing density in the retrocardiac region on the LEFT. Small LEFT-sided pleural effusion associated with this finding. Findings suspicious for developing pneumonia. Subtle airspace disease the RIGHT lung base and mild interstitial and alveolar opacification in the RIGHT upper chest suspicious for multifocal involvement. Electronically Signed   By: Zetta Bills M.D.   On: 03/20/2021 08:20   ECHOCARDIOGRAM COMPLETE  Result Date: 03/20/2021    ECHOCARDIOGRAM REPORT   Patient Name:   JAIVEN SANDUSKY Date of Exam: 03/20/2021 Medical Rec #:  [id number]      Height:        67.0 in Accession #:    [id number]     Weight:       118.9 lb Date of Birth:  Aug 17, 1924     BSA:          1.621 m Patient Age:    89 years       BP:           172/98 mmHg Patient Gender: M              HR:           65 bpm. Exam Location:  Inpatient Procedure: 2D Echo, Color Doppler and Cardiac Doppler Indications:    CHF  History:        Patient has no prior history of Echocardiogram examinations.                 Risk Factors:Hypertension and Diabetes.  Sonographer:    Jyl Heinz Referring Phys: [place]  1. Left ventricular ejection fraction, by estimation, is 35 to 40%. The left ventricle has moderately decreased function. The left ventricle demonstrates regional wall motion abnormalities (lateral hypokinesis). There is moderate concentric left ventricular hypertrophy. Left ventricular diastolic parameters are indeterminate.  2. Right ventricular systolic function is normal. The right ventricular size is  normal.  3. Left atrial size was moderately dilated.  4. A small pericardial effusion is present. The pericardial effusion is posterior to the left ventricle.  5. The mitral valve is degenerative. Moderate mitral valve regurgitation. No evidence of mitral stenosis.  6. The aortic valve is tricuspid. Aortic valve regurgitation is trivial. Aortic valve sclerosis is present, with no evidence of aortic valve stenosis. FINDINGS  Left Ventricle: Left ventricular ejection fraction, by estimation, is 35 to 40%. The left ventricle has moderately decreased function. The left ventricle demonstrates regional wall motion abnormalities. The left ventricular internal cavity size was normal in size. There is moderate concentric left ventricular hypertrophy. Left ventricular diastolic parameters are indeterminate.  LV Wall Scoring: The entire lateral wall is hypokinetic. Right Ventricle: The right ventricular size is normal. No increase in right ventricular wall thickness. Right ventricular systolic  function is normal. Left Atrium: Left atrial size was moderately dilated. Right Atrium: Right atrial size was normal in size. Pericardium: A small pericardial effusion is present. The pericardial effusion is posterior to the left ventricle. Mitral Valve: The mitral valve is degenerative in appearance. Moderate mitral valve regurgitation. No evidence of mitral valve stenosis. Tricuspid Valve: The tricuspid valve is normal in structure. Tricuspid valve regurgitation is mild . No evidence of tricuspid stenosis. Aortic Valve: The aortic valve is tricuspid. There is mild to moderate aortic valve annular calcification. Aortic valve regurgitation is trivial. Aortic regurgitation PHT measures 540 msec. Aortic valve sclerosis is present, with no evidence of aortic valve stenosis. Aortic valve peak gradient measures 5.5 mmHg. Pulmonic Valve: The pulmonic valve was not well visualized. Pulmonic valve regurgitation is mild to moderate. No evidence of pulmonic stenosis. Aorta: The aortic root and ascending aorta are structurally normal, with no evidence of dilitation. IAS/Shunts: The atrial septum is grossly normal.  LEFT VENTRICLE PLAX 2D LVIDd:         4.90 cm      Diastology LVIDs:         3.80 cm      LV e' medial:    4.13 cm/s LV PW:         1.30 cm      LV E/e' medial:  13.6 LV IVS:        1.40 cm      LV e' lateral:   9.46 cm/s LVOT diam:     1.80 cm      LV E/e' lateral: 5.9 LV SV:         36 LV SV Index:   22 LVOT Area:     2.54 cm  LV Volumes (MOD) LV vol d, MOD A2C: 98.0 ml LV vol d, MOD A4C: 113.0 ml LV vol s, MOD A2C: 65.1 ml LV vol s, MOD A4C: 76.8 ml LV SV MOD A2C:     32.9 ml LV SV MOD A4C:     113.0 ml LV SV MOD BP:      35.5 ml RIGHT VENTRICLE RV Basal diam:  3.70 cm RV Mid diam:    2.50 cm RV S prime:     14.90 cm/s TAPSE (M-mode): 2.0 cm LEFT ATRIUM             Index        RIGHT ATRIUM           Index LA diam:        4.50 cm 2.78 cm/m   RA Area:     17.60 cm LA Vol (A2C):   51.8  ml 31.95 ml/m  RA Volume:    46.40 ml  28.62 ml/m LA Vol (A4C):   69.4 ml 42.81 ml/m LA Biplane Vol: 64.9 ml 40.03 ml/m  AORTIC VALVE AV Area (Vmax): 1.54 cm AV Vmax:        117.00 cm/s AV Peak Grad:   5.5 mmHg LVOT Vmax:      71.00 cm/s LVOT Vmean:     45.900 cm/s LVOT VTI:       0.143 m AI PHT:         540 msec  AORTA Ao Root diam: 3.50 cm Ao Asc diam:  3.10 cm MITRAL VALVE               TRICUSPID VALVE MV Area (PHT): 3.48 cm    TR Peak grad:   39.9 mmHg MV Decel Time: 218 msec    TR Vmax:        316.00 cm/s MR Peak grad: 134.3 mmHg MR Mean grad: 92.5 mmHg    SHUNTS MR Vmax:      579.50 cm/s  Systemic VTI:  0.14 m MR Vmean:     465.5 cm/s   Systemic Diam: 1.80 cm MV E velocity: 56.20 cm/s MV A velocity: 57.20 cm/s MV E/A ratio:  0.98 Riley Lam MD Electronically signed by Riley Lam MD Signature Date/Time: 03/20/2021/7:55:43 PM    Final     Scheduled Meds:  aspirin EC  81 mg Oral Q breakfast   calcium carbonate  1 tablet Oral BID WC   carvedilol  6.25 mg Oral BID   ferrous sulfate  325 mg Oral Daily   Gerhardt's butt cream   Topical BID   heparin injection (subcutaneous)  5,000 Units Subcutaneous Q8H   insulin aspart  0-15 Units Subcutaneous TID WC   insulin aspart  0-5 Units Subcutaneous QHS   losartan  25 mg Oral Daily   mouth rinse  15 mL Mouth Rinse BID   oseltamivir  30 mg Oral BID   PARoxetine  10 mg Oral Daily   potassium chloride  40 mEq Oral Once   Continuous Infusions:  sodium chloride Stopped (03/18/21 1103)   cefTRIAXone (ROCEPHIN)  IV Stopped (03/20/21 1417)     LOS: 4 days   Marcellus Scott, MD,  FACP, [facility], [facility], [facility] (Care Management Physician Certified) [facility] [place]  To contact the attending provider between 7A-7P or the covering provider during after hours 7P-7A, please log into the web site [email or web address] and access using universal [place] password for that web site. If you do not have the password, please call the hospital  operator.

## 2021-03-21 NOTE — Progress Notes (Signed)
Speech Language Pathology Treatment: Dysphagia  Patient Details Name: Nathan Alvarez MRN: [id number] DOB: 1925/04/03 Today's Date: 03/21/2021 Time: 3818-2993 SLP Time Calculation (min) (ACUTE ONLY): 11 min  Assessment / Plan / Recommendation Clinical Impression  Pt believes that he has been swallowing pureed textures better and feels ready to try more solid textures as well. SLP provided small bites of softened food, which he consumed with liquid washes with no overt s/s of aspiration. Esophageal and aspiration precautions were reviewed, with pt's risk suspected to be highest from a post-prandial standpoint given severe dysmotility noted on esophagram. Pt would like to advance his diet just a little bit, and I think Dys 2 diet would allow him to still get very soft foods in an effort to still try to reduce risk. Will make adjustments and f/u briefly. Do not anticipate that he will need ongoing SLP f/u post-discharge, but would encourage him to select foods that are soft and easy enough for him to thoroughly masticate them.   HPI HPI: Pt is a 87 yo male prsenting from ALF with hypoxia felt to be secondary to influenza PNA. Pt was reported to have trouble swallowing on 12/14 with MD also noting poor oral hygiene. Prior clinical evaluations in September and October 2022 showed impaired mastication and lingual movement with solids, recommending thin liquids and Dys 2 to 3 solids. PMH includes: afib, DM2, GERD, HB, sick sinus syn s/p pacemaker, HTN, HLD, memory deficit      SLP Plan  Continue with current plan of care      Recommendations for follow up therapy are one component of a multi-disciplinary discharge planning process, led by the attending physician.  Recommendations may be updated based on patient status, additional functional criteria and insurance authorization.    Recommendations  Diet recommendations: Dysphagia 2 (fine chop);Thin liquid Liquids provided via: Cup;Straw Medication  Administration: Crushed with puree Supervision: Patient able to self feed;Intermittent supervision to cue for compensatory strategies Compensations: Slow rate;Small sips/bites;Follow solids with liquid Postural Changes and/or Swallow Maneuvers: Seated upright 90 degrees;Upright 30-60 min after meal                Oral Care Recommendations: Oral care BID Follow Up Recommendations: No SLP follow up Assistance recommended at discharge: Set up Supervision/Assistance SLP Visit Diagnosis: Dysphagia, unspecified (R13.10) Plan: Continue with current plan of care           Mahala Menghini., M.A. CCC-SLP Acute Rehabilitation Services Pager [phone] Office [phone]  03/21/2021, 4:32 PM

## 2021-03-21 NOTE — TOC Initial Note (Signed)
Transition of Care [facility]) - Initial/Assessment Note    Patient Details  Name: Nathan Alvarez MRN: [id number] Date of Birth: [birth year]  Transition [facility]) CM/SW Contact:    Lynett Grimes Phone Number: 03/21/2021, 4:11 PM  Clinical Narrative:                 CSW spoke with pt daughter about PT recommendation for SNF. Pt daughter states that she wants pt to return to ALF where they have PT. CSW contacted [facility] to confirm pt is a resident and if they cloud manage pt's care when medically stable. Pt nurse at [facility] confirmed that they are able to provide PT services and when pt is ready they will only need a DC Summary, covid test, and FL2. CSW will continue to follow for DC planning needs.  Expected Discharge Plan: Assisted Living Barriers to Discharge: Continued Medical Work up   Patient Goals and CMS Choice Patient states their goals for this hospitalization and ongoing recovery are:: Return to ALF to complete PT CMS Medicare.gov Compare Post Acute Care list provided to:: Patient Choice offered to / list presented to : Patient  Expected Discharge Plan and Services Expected Discharge Plan: Assisted Living In-house Referral: Clinical Social Work     Living arrangements for the past 2 months: Assisted Living Facility                                      Prior Living Arrangements/Services Living arrangements for the past 2 months: Assisted Living Facility Lives with:: Facility Resident Patient language and need for interpreter reviewed:: Yes Do you feel safe going back to the place where you live?: Yes      Need for Family Participation in Patient Care: Yes (Comment) Care giver support system in place?: Yes (comment)   Criminal Activity/Legal Involvement Pertinent to Current Situation/Hospitalization: No - Comment as needed  Activities of Daily Living Home Assistive Devices/Equipment: Other (Comment) (unknown) ADL Screening (condition at  time of admission) Patient's cognitive ability adequate to safely complete daily activities?: Yes Is the patient deaf or have difficulty hearing?: Yes Does the patient have difficulty seeing, even when wearing glasses/contacts?: No Does the patient have difficulty concentrating, remembering, or making decisions?: No Patient able to express need for assistance with ADLs?: Yes Does the patient have difficulty dressing or bathing?: Yes Independently performs ADLs?: Yes (appropriate for developmental age) Does the patient have difficulty walking or climbing stairs?: Yes Weakness of Legs: Both Weakness of Arms/Hands: Both  Permission Sought/Granted Permission sought to share information with : Facility Medical sales representative, Family Supports Permission granted to share information with : Yes, Verbal Permission Granted  Share Information with NAME: [name] (Daughter)   [phone]  Permission granted to share info w AGENCY: [facility]  Permission granted to share info w Relationship: [name] (Daughter)   [phone]  Permission granted to share info w Contact Information: [name] (Daughter)   [phone]  Emotional Assessment Appearance:: Appears stated age Attitude/Demeanor/Rapport: Unable to Assess Affect (typically observed): Unable to Assess Orientation: : Oriented to Self, Oriented to Place, Oriented to  Time, Oriented to Situation Alcohol / Substance Use: Not Applicable Psych Involvement: No (comment)  Admission diagnosis:  Influenza A [J10.1] Hypotension [I95.9] Patient Active Problem List   Diagnosis Date Noted   Dysphagia    Abnormal barium swallow    Influenza A 03/17/2021   Hypotension 03/17/2021  Septic shock (HCC)    Acute respiratory failure with hypoxia (HCC)    Lactic acidosis    Weakness generalized    CAP (community acquired pneumonia) 01/21/2021   Pressure injury of skin 01/21/2021   Fall    Aspiration pneumonia (HCC) 12/15/2020    Sepsis due to aspiration pneumonia and enterocolitis 12/15/2020   AKI (acute kidney injury) (HCC) 12/14/2020   Enterocolitis-C diff test is Equivocal  12/14/2020   Sinus node dysfunction (HCC) 03/06/2020   Pacemaker 03/06/2020   Benign prostatic hyperplasia with nocturia 05/23/2019   Vitamin D deficiency 04/25/2019   Pathological fracture of left hip due to age-related osteoporosis (HCC) 04/25/2019   Femoral neck fracture (HCC) 04/21/2019   Hypertension 04/21/2019   Diabetes (HCC) 04/21/2019   Hyponatremia 04/21/2019   Anemia 04/21/2019   Hypertension, essential, benign 07/13/2017   PCP:  Vivien Presto, MD Pharmacy:   [facility], [place] - [address] [place] [address] Phone: [phone] Fax: [phone]     Social Determinants of Health (SDOH) Interventions    Readmission Risk Interventions No flowsheet data found.

## 2021-03-21 NOTE — NC FL2 (Signed)
[place] MEDICAID FL2 LEVEL OF CARE SCREENING TOOL     IDENTIFICATION  Patient Name: Nathan Alvarez Birthdate: 1924-05-06 Sex: male Admission Date (Current Location): 03/17/2021  [facility] and [place] Number:  Producer, television/film/video and Address:  [facility], [address] [place], [place] [address]      Provider Number: [id number]  Attending Physician Name and Address:  Elease Etienne, MD  Relative Name and Phone Number:  [name] (Daughter)   [phone]    Current Level of Care: Hospital Recommended Level of Care: Assisted Living Facility Prior Approval Number:    Date Approved/Denied:   PASRR Number:    Discharge Plan: Other (Comment) ([facility])    Current Diagnoses: Patient Active Problem List   Diagnosis Date Noted   Dysphagia    Abnormal barium swallow    Influenza A 03/17/2021   Hypotension 03/17/2021   Septic shock (HCC)    Acute respiratory failure with hypoxia (HCC)    Lactic acidosis    Weakness generalized    CAP (community acquired pneumonia) 01/21/2021   Pressure injury of skin 01/21/2021   Fall    Aspiration pneumonia (HCC) 12/15/2020   Sepsis due to aspiration pneumonia and enterocolitis 12/15/2020   AKI (acute kidney injury) (HCC) 12/14/2020   Enterocolitis-C diff test is Equivocal  12/14/2020   Sinus node dysfunction (HCC) 03/06/2020   Pacemaker 03/06/2020   Benign prostatic hyperplasia with nocturia 05/23/2019   Vitamin D deficiency 04/25/2019   Pathological fracture of left hip due to age-related osteoporosis (HCC) 04/25/2019   Femoral neck fracture (HCC) 04/21/2019   Hypertension 04/21/2019   Diabetes (HCC) 04/21/2019   Hyponatremia 04/21/2019   Anemia 04/21/2019   Hypertension, essential, benign 07/13/2017    Orientation RESPIRATION BLADDER Height & Weight     Self, Time, Situation, Place  Normal Incontinent, External catheter Weight: 112 lb 4.8 oz (50.9 kg) (scale C) Height:  5\' 7"   (170.2 cm)  BEHAVIORAL SYMPTOMS/MOOD NEUROLOGICAL BOWEL NUTRITION STATUS      Incontinent Diet  AMBULATORY STATUS COMMUNICATION OF NEEDS Skin   Extensive Assist Verbally Surgical wounds, Skin abrasions (L Buttocks Bruise)                       Personal Care Assistance Level of Assistance  Bathing, Dressing, Feeding Bathing Assistance: Maximum assistance Feeding assistance: Independent Dressing Assistance: Maximum assistance     Functional Limitations Info  Sight, Hearing, Speech Sight Info: Adequate Hearing Info: Impaired Speech Info: Adequate    SPECIAL CARE FACTORS FREQUENCY  PT (By licensed PT), OT (By licensed OT)     PT Frequency: 3x a week OT Frequency: 3x a week            Contractures Contractures Info: Not present    Additional Factors Info  Code Status, Allergies Code Status Info: DNR Allergies Info: Penicillins           Current Medications (03/21/2021):  This is the current hospital active medication list Current Facility-Administered Medications  Medication Dose Route Frequency Provider Last Rate Last Admin   0.9 %  sodium chloride infusion  250 mL Intravenous Continuous 03/23/2021, MD   Stopped at 03/18/21 1103   acetaminophen (TYLENOL) tablet 650 mg  650 mg Oral Q6H PRN 14/12/22, MD   650 mg at 03/19/21 03/21/21   aspirin EC tablet 81 mg  81 mg Oral Q breakfast 6384, MD   81 mg at 03/21/21 1127  calcium carbonate (OS-CAL - dosed in mg of elemental calcium) tablet 500 mg of elemental calcium  1 tablet Oral BID WC Coralyn Helling, MD   500 mg of elemental calcium at 03/21/21 1127   carvedilol (COREG) tablet 6.25 mg  6.25 mg Oral BID Marcellus Scott D, MD   6.25 mg at 03/21/21 1127   cefTRIAXone (ROCEPHIN) 1 g in sodium chloride 0.9 % 100 mL IVPB  1 g Intravenous Q24H Coralyn Helling, MD 200 mL/hr at 03/21/21 1353 1 g at 03/21/21 1353   ferrous sulfate tablet 325 mg  325 mg Oral Daily Coralyn Helling, MD   325 mg at 03/21/21 1127   Gerhardt's  butt cream   Topical BID Coralyn Helling, MD   Given at 03/21/21 0939   heparin injection 5,000 Units  5,000 Units Subcutaneous Q8H Coralyn Helling, MD   5,000 Units at 03/21/21 1353   hydrALAZINE (APRESOLINE) injection 10 mg  10 mg Intravenous Q6H PRN Marcellus Scott D, MD   10 mg at 03/21/21 0527   insulin aspart (novoLOG) injection 0-15 Units  0-15 Units Subcutaneous TID WC Hughie Closs, MD   2 Units at 03/21/21 0700   insulin aspart (novoLOG) injection 0-5 Units  0-5 Units Subcutaneous QHS Sood, Vineet, MD       ipratropium-albuterol (DUONEB) 0.5-2.5 (3) MG/3ML nebulizer solution 3 mL  3 mL Nebulization Q4H PRN Hongalgi, Maximino Greenland, MD       losartan (COZAAR) tablet 25 mg  25 mg Oral Daily Marcellus Scott D, MD   25 mg at 03/21/21 1127   MEDLINE mouth rinse  15 mL Mouth Rinse BID Coralyn Helling, MD   15 mL at 03/21/21 9147   oseltamivir (TAMIFLU) capsule 30 mg  30 mg Oral BID Coralyn Helling, MD   30 mg at 03/21/21 1127   PARoxetine (PAXIL) tablet 10 mg  10 mg Oral Daily Coralyn Helling, MD   10 mg at 03/21/21 1127     Discharge Medications: Please see discharge summary for a list of discharge medications.  Relevant Imaging Results:  Relevant Lab Results:   Additional Information SS#[phone]; Pfizer Covid-19 Vaccine Bivalent Booster 01/16/2021  Ivette Loyal, LCSWA

## 2021-03-21 NOTE — Consult Note (Addendum)
[facility] Consult: 11:16 AM 03/21/2021  LOS: 4 days    Referring Provider: Dr. Waymon Amato Primary Care Physician:  Vivien Presto, MD Primary Gastroenterologist: None    Reason for Consultation: Dysphagia, abnormal barium swallow.   HPI: Nathan Alvarez is a 87 y.o. male.  Assisted living resident.  PMH A. fib.  Heart block, pacemaker implantation 2002.  A. fib, low-dose aspirin but no anticoagulation PTA.  GERD.  NIDDM.  Minor, [place] anemia w Hgb in 11s, no iron?b12/folate deficiency in 08/2018, low iron sats in 10/2017.  Surgeries include inguinal hernia repair.  Patient recalls remote colonoscopy but not any details as per findings.  Denies previous EGD. CTAP with contrast September 2022 for evaluation of general abdominal discomfort, diarrhea for 2 days revealed cardiomegaly, trace pericardial fluid.  Liver, gallbladder, biliary tree unremarkable.  Diffuse pancreatic atrophy w innumerable, small pancreatic cystic lesions measuring up to 10 mm sized.  Segmental thickening of rectosigmoid wall, inflammatory versus infectious colitis?  Extensive atherosclerotic changes in the aorta and branch vessels but no aneurysm or dissection.  Home meds include po iron, no PPI etc though daily Tums (for calcium supplement?)  Patient had scheduled for generator change of his pacemaker on 03/18/21   Brought to ER 5 days ago with acute shortness of breath, saturations of 82% on room air, fever 101.  7.  Reports recent fatigue, depressed appetite for a couple of days.  Tested positive for influenza A, Tamiflu initiated.  Critical care involved and started supplemental oxygen, pressors.  Exams noted sacral and buttocks decubitus ulcers Lactic acidosis has resolved. EP postponing pacemaker generator change for several  weeks.  Ruled in for new CHF, cardiomyopathy w LVEF 35 to 40%.    BNP 2080.  Lactic acid 2.1. Trop I 20s ... 40 Hb 11.8, MCV 94.  Normal platelets.  WBCs now normal at 8.1, initially 14.   LFTs, lipase normal.  BUN/creatinine initially elevated, now normal. Potassium low at 3.4. INR 1.1.     As I spoke with patient today he denies any dysphagia but speech-language assessment of yesterday relates that patient describes sensation of food transiting more slowly through the esophagus and occasional regurgitation.  Sometimes has sensation that food is stuck.  Daughter-in-law also added history of fluctuating p.o. intake and difficulty tolerating solid foods in the last few months.  After bedside swallow evaluation SLP cleared patient for pured, thin liquid diet Today: Single contrast esophagram reveals severe esophageal dysmotility, small HH, reflux to the level of the mid esophagus.  13 mm tablet did not pass even with 10 minutes of observation.  Reading of films favoring dysmotility rather than esophageal stricture. Chest x-ray show increasing left retrocardiac density, small left pleural effusion, subtle right airspace disease, right upper alveolar opacification suspicious for multifocal involvement.   Relocated from [place] to [place] in summer 2018.    Weight 11/2016 141 #/64 kg.  Currently 50.9 kg, BMI 17.5.    Past Medical History:  Diagnosis Date   A-fib (HCC)    Dizziness    DM (diabetes mellitus) (  HCC)    Esophagitis, reflux    Heart block    Hyperlipidemia    Hypertension, essential, benign    Memory deficit    Pathological fracture of left hip due to age-related osteoporosis ([place]) 04/25/2019   SOB (shortness of breath)    SSS (sick sinus syndrome) ([place])     Past Surgical History:  Procedure Laterality Date   CATARACT EXTRACTION Right    HIP ARTHROPLASTY Left 04/22/2019   Procedure: ARTHROPLASTY BIPOLAR HIP (HEMIARTHROPLASTY);  Surgeon: Altamese Blades, MD;  Location: [facility];   Service: Orthopedics;  Laterality: Left;   INGUINAL HERNIA REPAIR     INSERT / REPLACE / REMOVE PACEMAKER     PACEMAKER INSERTION     TONSILLECTOMY AND ADENOIDECTOMY      Prior to Admission medications   Medication Sig Start Date End Date Taking? Authorizing Provider  acetaminophen (TYLENOL) 325 MG tablet Take 2 tablets (650 mg total) by mouth every 6 (six) hours as needed for mild pain (or Fever >/= 101). 12/16/20  Yes Roxan Hockey, MD  aspirin EC 81 MG EC tablet Take 1 tablet (81 mg total) by mouth daily with breakfast. Swallow whole. 12/16/20  Yes Emokpae, Courage, MD  calcium carbonate (OS-CAL - DOSED IN MG OF ELEMENTAL CALCIUM) 1250 (500 Ca) MG tablet Take 1 tablet (500 mg of elemental calcium total) by mouth 2 (two) times daily with a meal. 12/16/20  Yes Emokpae, Courage, MD  carvedilol (COREG) 6.25 MG tablet Take 1 tablet (6.25 mg total) by mouth 2 (two) times daily. For BP and Heart 12/16/20  Yes Emokpae, Courage, MD  Ferrous Sulfate (IRON) 325 (65 Fe) MG TABS Take 325 mg by mouth daily.   Yes [provider]  JANUVIA 50 MG tablet Take 1 tablet (50 mg total) by mouth daily. 12/16/20  Yes Emokpae, Courage, MD  PARoxetine (PAXIL) 10 MG tablet Take 10 mg by mouth daily. 03/04/21  Yes [provider]  Skin Protectants, Misc. (BAZA PROTECT EX) Apply 1 application topically See admin instructions. Apply to affected areas of buttocks, sacrum, and genitals twice daily, may apply as needed with each incontinence episode   Yes [provider]    Scheduled Meds:  aspirin EC  81 mg Oral Q breakfast   calcium carbonate  1 tablet Oral BID WC   carvedilol  6.25 mg Oral BID   ferrous sulfate  325 mg Oral Daily   Gerhardt's butt cream   Topical BID   heparin injection (subcutaneous)  5,000 Units Subcutaneous Q8H   insulin aspart  0-15 Units Subcutaneous TID WC   insulin aspart  0-5 Units Subcutaneous QHS   losartan  25 mg Oral Daily   mouth rinse  15 mL Mouth Rinse BID    oseltamivir  30 mg Oral BID   PARoxetine  10 mg Oral Daily   potassium chloride  40 mEq Oral Once   Infusions:  sodium chloride Stopped (03/18/21 1103)   cefTRIAXone (ROCEPHIN)  IV Stopped (03/20/21 1417)   PRN Meds: acetaminophen, hydrALAZINE, ipratropium-albuterol   Allergies as of 03/17/2021 - Review Complete 03/17/2021  Allergen Reaction Noted   Penicillins  12/31/2016    Family History  Problem Relation Age of Onset   Heart disease Mother     Social History   Socioeconomic History   Marital status: Married    Spouse name: Not on file   Number of children: Not on file   Years of education: Not on file   Highest education level:  Not on file  Occupational History   Not on file  Tobacco Use   Smoking status: Former    Types: Cigarettes    Quit date: 01/01/1956    Years since quitting: 65.2    Passive exposure: Never   Smokeless tobacco: Never  Vaping Use   Vaping Use: Never used  Substance and Sexual Activity   Alcohol use: No   Drug use: No   Sexual activity: Not Currently    Comment: MARRIED  Other Topics Concern   Not on file  Social History Narrative   Not on file   Social Determinants of Health   Financial Resource Strain: Not on file  Food Insecurity: Not on file  Transportation Needs: Not on file  Physical Activity: Not on file  Stress: Not on file  Social Connections: Not on file  Intimate Partner Violence: Not on file    REVIEW OF SYSTEMS: Constitutional: Weakness, overall improved but persists. ENT:  No nose bleeds Pulm: Shortness of breath improved.  Not currently coughing. CV:  No palpitations, no LE edema.  GU:  No hematuria, no frequency GI: See HPI.  Although he has had diarrhea in the past, this is not a current issue.  Has not seen any blood in his stools. Heme: No unusual or excessive bleeding or bruising. Transfusions: None. Neuro:  No headaches, no peripheral tingling or numbness.  No syncope, no seizures Derm:  No  itching, no rash or sores.  Endocrine:  No sweats or chills.  No polyuria or dysuria Immunization: Reviewed. Travel:  None    PHYSICAL EXAM: Vital signs in last 24 hours: Vitals:   03/21/21 0659 03/21/21 0915  BP: (!) 157/94 (!) 167/87  Pulse:  (!) 107  Resp:  18  Temp:    SpO2: 95% 96%   Wt Readings from Last 3 Encounters:  03/21/21 50.9 kg  03/14/21 53.1 kg  01/23/21 60.5 kg    General: Frail, aged gentleman who looks comfortable and nontoxic. Head: No facial asymmetry or swelling.  No signs of head trauma. Eyes: Conjunctiva pink.  No scleral icterus. Ears: Slightly hard of hearing but patient able to hear me without use of his hearing aids. Nose: No congestion or discharge Mouth: Mucosa is moist, pink, clear.  Tongue midline.  Good native dentition.  Residual barium visible on the tongue and at rear of mouth. Neck: No JVD, no masses, no thyromegaly Lungs: Diminished breath sounds but clear.  No labored breathing.  No cough. Heart: Irregularly irregular, not tachycardic.  S1, S2 present. Abdomen: Soft without tenderness.  Thin.  No HSM, masses, bruits, hernias.  Active bowel sounds.  No distention..   Rectal: Deferred Musc/Skeltl: No joint redness, swelling.  Overall thin, osteoporotic skeletal appearance. Extremities: No CCE. Neurologic: Oriented to the hospital, town and self.  Appropriate answers to all questions.  Moves all 4 limbs without tremor.  Formal strength testing not performed. Skin: No suspicious lesions or rashes on incomplete dermatologic survey. Nodes: No cervical adenopathy Psych: Cooperative, calm, pleasant.  Intake/Output from previous day: 12/14 0701 - 12/15 0700 In: 438 [P.O.:238; IV Piggyback:200] Out: 3625 [Urine:3625] Intake/Output this shift: No intake/output data recorded.  LAB RESULTS: Recent Labs    03/19/21 0221  WBC 8.1  HGB 11.8*  HCT 36.3*  PLT 158   BMET Lab Results  Component Value Date   NA 135 03/21/2021   NA 134 (L)  03/20/2021   NA 135 03/19/2021   K 3.4 (L) 03/21/2021   K  3.8 03/20/2021   K 3.1 (L) 03/19/2021   CL 102 03/21/2021   CL 105 03/20/2021   CL 105 03/19/2021   CO2 24 03/21/2021   CO2 20 (L) 03/20/2021   CO2 20 (L) 03/19/2021   GLUCOSE 153 (H) 03/21/2021   GLUCOSE 190 (H) 03/20/2021   GLUCOSE 131 (H) 03/19/2021   BUN 19 03/21/2021   BUN 21 03/20/2021   BUN 21 03/19/2021   CREATININE 0.89 03/21/2021   CREATININE 0.69 03/20/2021   CREATININE 0.84 03/19/2021   CALCIUM 8.1 (L) 03/21/2021   CALCIUM 7.9 (L) 03/20/2021   CALCIUM 8.3 (L) 03/19/2021   LFT No results for input(s): PROT, ALBUMIN, AST, ALT, ALKPHOS, BILITOT, BILIDIR, IBILI in the last 72 hours. PT/INR Lab Results  Component Value Date   INR 1.1 03/17/2021   INR 1.1 04/20/2019   Hepatitis Panel No results for input(s): HEPBSAG, HCVAB, HEPAIGM, HEPBIGM in the last 72 hours. C-Diff No components found for: CDIFF Lipase     Component Value Date/Time   LIPASE 17 12/14/2020 1432    Drugs of Abuse  No results found for: LABOPIA, COCAINSCRNUR, LABBENZ, AMPHETMU, THCU, LABBARB   RADIOLOGY STUDIES: DG Chest 2 View  Result Date: 03/20/2021 CLINICAL DATA:  Shortness of breath, history of flu in a 87 year old male. EXAM: CHEST - 2 VIEW COMPARISON:  March 17, 2021. FINDINGS: LEFT-sided dual lead pacer device remains in place. EKG leads project over the chest. Cardiomediastinal contours and hilar structures are stable. Increasing density in the retrocardiac region on the LEFT. Small LEFT-sided pleural effusion associated with this finding. No signs of frank consolidation in the RIGHT chest. Perhaps subtle airspace disease at the RIGHT lung base since previous imaging, also in the RIGHT upper lobe as well. On limited assessment there is no acute skeletal process. IMPRESSION: Increasing density in the retrocardiac region on the LEFT. Small LEFT-sided pleural effusion associated with this finding. Findings suspicious for  developing pneumonia. Subtle airspace disease the RIGHT lung base and mild interstitial and alveolar opacification in the RIGHT upper chest suspicious for multifocal involvement. Electronically Signed   By: Zetta Bills M.D.   On: 03/20/2021 08:20   ECHOCARDIOGRAM COMPLETE  Result Date: 03/20/2021    ECHOCARDIOGRAM REPORT   Patient Name:   EMMETTE KROG Date of Exam: 03/20/2021 Medical Rec #:  [id number]      Height:       67.0 in Accession #:    [id number]     Weight:       118.9 lb Date of Birth:  [birth year]     BSA:          1.621 m Patient Age:    43 years       BP:           172/98 mmHg Patient Gender: M              HR:           65 bpm. Exam Location:  Inpatient Procedure: 2D Echo, Color Doppler and Cardiac Doppler Indications:    CHF  History:        Patient has no prior history of Echocardiogram examinations.                 Risk Factors:Hypertension and Diabetes.  Sonographer:    Jyl Heinz Referring Phys: [place]  1. Left ventricular ejection fraction, by estimation, is 35 to 40%. The left ventricle has moderately decreased function. The left ventricle demonstrates regional  wall motion abnormalities (lateral hypokinesis). There is moderate concentric left ventricular hypertrophy. Left ventricular diastolic parameters are indeterminate.  2. Right ventricular systolic function is normal. The right ventricular size is normal.  3. Left atrial size was moderately dilated.  4. A small pericardial effusion is present. The pericardial effusion is posterior to the left ventricle.  5. The mitral valve is degenerative. Moderate mitral valve regurgitation. No evidence of mitral stenosis.  6. The aortic valve is tricuspid. Aortic valve regurgitation is trivial. Aortic valve sclerosis is present, with no evidence of aortic valve stenosis. FINDINGS  Left Ventricle: Left ventricular ejection fraction, by estimation, is 35 to 40%. The left ventricle has moderately decreased function.  The left ventricle demonstrates regional wall motion abnormalities. The left ventricular internal cavity size was normal in size. There is moderate concentric left ventricular hypertrophy. Left ventricular diastolic parameters are indeterminate.  LV Wall Scoring: The entire lateral wall is hypokinetic. Right Ventricle: The right ventricular size is normal. No increase in right ventricular wall thickness. Right ventricular systolic function is normal. Left Atrium: Left atrial size was moderately dilated. Right Atrium: Right atrial size was normal in size. Pericardium: A small pericardial effusion is present. The pericardial effusion is posterior to the left ventricle. Mitral Valve: The mitral valve is degenerative in appearance. Moderate mitral valve regurgitation. No evidence of mitral valve stenosis. Tricuspid Valve: The tricuspid valve is normal in structure. Tricuspid valve regurgitation is mild . No evidence of tricuspid stenosis. Aortic Valve: The aortic valve is tricuspid. There is mild to moderate aortic valve annular calcification. Aortic valve regurgitation is trivial. Aortic regurgitation PHT measures 540 msec. Aortic valve sclerosis is present, with no evidence of aortic valve stenosis. Aortic valve peak gradient measures 5.5 mmHg. Pulmonic Valve: The pulmonic valve was not well visualized. Pulmonic valve regurgitation is mild to moderate. No evidence of pulmonic stenosis. Aorta: The aortic root and ascending aorta are structurally normal, with no evidence of dilitation. IAS/Shunts: The atrial septum is grossly normal.  LEFT VENTRICLE PLAX 2D LVIDd:         4.90 cm      Diastology LVIDs:         3.80 cm      LV e' medial:    4.13 cm/s LV PW:         1.30 cm      LV E/e' medial:  13.6 LV IVS:        1.40 cm      LV e' lateral:   9.46 cm/s LVOT diam:     1.80 cm      LV E/e' lateral: 5.9 LV SV:         36 LV SV Index:   22 LVOT Area:     2.54 cm  LV Volumes (MOD) LV vol d, MOD A2C: 98.0 ml LV vol d, MOD  A4C: 113.0 ml LV vol s, MOD A2C: 65.1 ml LV vol s, MOD A4C: 76.8 ml LV SV MOD A2C:     32.9 ml LV SV MOD A4C:     113.0 ml LV SV MOD BP:      35.5 ml RIGHT VENTRICLE RV Basal diam:  3.70 cm RV Mid diam:    2.50 cm RV S prime:     14.90 cm/s TAPSE (M-mode): 2.0 cm LEFT ATRIUM             Index        RIGHT ATRIUM  Index LA diam:        4.50 cm 2.78 cm/m   RA Area:     17.60 cm LA Vol (A2C):   51.8 ml 31.95 ml/m  RA Volume:   46.40 ml  28.62 ml/m LA Vol (A4C):   69.4 ml 42.81 ml/m LA Biplane Vol: 64.9 ml 40.03 ml/m  AORTIC VALVE AV Area (Vmax): 1.54 cm AV Vmax:        117.00 cm/s AV Peak Grad:   5.5 mmHg LVOT Vmax:      71.00 cm/s LVOT Vmean:     45.900 cm/s LVOT VTI:       0.143 m AI PHT:         540 msec  AORTA Ao Root diam: 3.50 cm Ao Asc diam:  3.10 cm MITRAL VALVE               TRICUSPID VALVE MV Area (PHT): 3.48 cm    TR Peak grad:   39.9 mmHg MV Decel Time: 218 msec    TR Vmax:        316.00 cm/s MR Peak grad: 134.3 mmHg MR Mean grad: 92.5 mmHg    SHUNTS MR Vmax:      579.50 cm/s  Systemic VTI:  0.14 m MR Vmean:     465.5 cm/s   Systemic Diam: 1.80 cm MV E velocity: 56.20 cm/s MV A velocity: 57.20 cm/s MV E/A ratio:  0.98 Rudean Haskell MD Electronically signed by Rudean Haskell MD Signature Date/Time: 03/20/2021/7:55:43 PM    Final    DG ESOPHAGUS W SINGLE CM (SOL OR THIN BA)  Result Date: 03/21/2021 CLINICAL DATA:  Difficulty swallowing EXAM: ESOPHOGRAM/BARIUM SWALLOW TECHNIQUE: Single contrast examination was performed using  thin barium. FLUOROSCOPY TIME:  Fluoroscopy Time:  2 minutes 54 seconds Radiation Exposure Index (if provided by the fluoroscopic device): 12.0 mGy Number of Acquired Spot Images: 7 series, 346 images. This includes multiple cine clips. COMPARISON:  None. FINDINGS: There is severe esophageal dysmotility with delayed distal progression of contrast into the stomach. No visible ulceration. Small hiatal hernia. Gastroesophageal reflux occurred to the  midesophagus. A 13 mm barium tablet became lodged in the distal esophagus just above the GE junction and did not pass within 10 minutes. Esophageal dysmotility took the pill to and fro from the mid to distal esophagus. IMPRESSION: Severe esophageal dysmotility. Small hiatal hernia. Gastroesophageal reflux occurred to the midesophagus. A 13 mm barium tablet stuck in the distal esophagus just above the GE junction and did not pass within 10 minutes. This is favored to be due to severe esophageal dysmotility, however a distal esophageal stricture cannot be excluded. Electronically Signed   By: Maurine Simmering M.D.   On: 03/21/2021 10:09       IMPRESSION:   Esophageal dysphagia.  Today's esophagram w esophageal dysmotility though cannot rule out distal esophageal stricture.     HAP (SNF) w + influenza A, multifocal PNA per imaging.  On Tamiflu, Rocephin.    CHF    Cardiac pacemaker since 2002.  Planned generator change-out now delayed for ~ 2 months.      Small pancreatic cysts, atrophic pancreas on CT in 12/2020.  LFTs and Lipase currently normal.      Rectosigmoid wall thickening on CT 12/2020.       Mild [place] anemia.  On chronic po iron though only lab abnormality in 2019 was low iron saturation.      NIDDM.      Afib,  no  current RVR.  Low-dose ASA, no AC in place.   However, currently getting SQ Heparin.  PLAN:       Per Dr [name].  Leave purees/thin liquids in place.  Benefit of EGD/esophageal dilatation probably low.      Azucena Freed  03/21/2021, 11:16 AM Phone [phone]

## 2021-03-21 NOTE — Consult Note (Addendum)
Cardiology Consultation:   Patient ID: [name] MRN: [id number]; DOB: 10-20-1924  Admit date: 03/17/2021 Date of Consult: 03/21/2021  PCP:  Curly Rim, MD   [facility] Providers Cardiologist:  Cristopher Peru, MD  Electrophysiologist:  Cristopher Peru, MD       Patient Profile:   Nathan Alvarez is a 87 y.o. male with a hx of hypertension, hyperlipidemia, atrial fibrillation and history of SSS s/p Medtronic dual-chamber PPM 2011 who is being seen 03/21/2021 for the evaluation of new LV dysfunction noted on echocardiogram at the request of Dr. Algis Liming.  History of Present Illness:   Nathan Alvarez is a 87 year old male with past medical history of hypertension, hyperlipidemia, atrial fibrillation and history of SSS s/p Medtronic dual-chamber PPM 2011. Dr. Lovena Alvarez previously questioned the diagnosis of afib as his device interrogation has not shown any afib and he is not on any anticoagulation. Echocardiogram obtained at [facility] in [place] on 07/29/2016 showed EF 65%, no regional wall motion abnormality, grade 1 DD, late systolic prolapse involving the posterior mitral leaflet, mild MR, mild TR.  He resides in [facility] skilled nursing facility.  Unfortunately patient has misplaced his monitor and cardiology service was only recently notified that her pacemaker has reached ERI back in May 2022.  He is not dependent on the device.  Patient was seen by Joesph July of EP service on 03/14/2021, device was not interrogated that day as it has already reached end of service, patient was scheduled for device change out with Dr. Lovena Alvarez on 03/18/2021.  Unfortunately 1 day prior to his planned surgery, he returned to the hospital with complaint of hypoxia.  On arrival, he was febrile with T-max 101.7, lactic acid 3.9.  Procalcitonin 8.9.  We will add [facility], he became hypotensive.  Code sepsis was initiated.  PCR resulted's flu a positive.  Blood culture negative so far.   Patient was treated with IV fluid, vancomycin, cefepime, Flagyl and Tamiflu.  He was subsequently transferred to [facility] for further management.  Hypotension quickly resolved.  Chest x-ray however showed possible developing multifocal pneumonia.  Echocardiogram obtained on 03/20/2021 showed EF 35 to 40%, lateral hypokinesis, moderate LVH, RV systolic function normal, small pericardial effusion, moderate LAE, moderate MR, trivial AI.  Patient was given 2 doses of IV Lasix 20 mg and now appears to be euvolemic.  Talking with the patient, she denies any recent exertional chest discomfort.  Today's parent swallow study showed severe esophageal dysmotility disorder.  Patient has been seen by GI service who offered upper endoscopy, however patient is adamant he does not want any invasive study.   Past Medical History:  Diagnosis Date   A-fib ([place])    Dizziness    DM (diabetes mellitus) ([facility])    Esophagitis, reflux    Heart block    Hyperlipidemia    Hypertension, essential, benign    Memory deficit    Pathological fracture of left hip due to age-related osteoporosis ([place]) 04/25/2019   SOB (shortness of breath)    SSS (sick sinus syndrome) ([place])     Past Surgical History:  Procedure Laterality Date   CATARACT EXTRACTION Right    HIP ARTHROPLASTY Left 04/22/2019   Procedure: ARTHROPLASTY BIPOLAR HIP (HEMIARTHROPLASTY);  Surgeon: Altamese Northfork, MD;  Location: [facility];  Service: Orthopedics;  Laterality: Left;   INGUINAL HERNIA REPAIR     INSERT / REPLACE / REMOVE PACEMAKER     PACEMAKER INSERTION     TONSILLECTOMY AND ADENOIDECTOMY  Home Medications:  Prior to Admission medications   Medication Sig Start Date End Date Taking? Authorizing Provider  acetaminophen (TYLENOL) 325 MG tablet Take 2 tablets (650 mg total) by mouth every 6 (six) hours as needed for mild pain (or Fever >/= 101). 12/16/20  Yes Roxan Hockey, MD  aspirin EC 81 MG EC tablet Take 1 tablet (81 mg total) by  mouth daily with breakfast. Swallow whole. 12/16/20  Yes Emokpae, Courage, MD  calcium carbonate (OS-CAL - DOSED IN MG OF ELEMENTAL CALCIUM) 1250 (500 Ca) MG tablet Take 1 tablet (500 mg of elemental calcium total) by mouth 2 (two) times daily with a meal. 12/16/20  Yes Emokpae, Courage, MD  carvedilol (COREG) 6.25 MG tablet Take 1 tablet (6.25 mg total) by mouth 2 (two) times daily. For BP and Heart 12/16/20  Yes Emokpae, Courage, MD  Ferrous Sulfate (IRON) 325 (65 Fe) MG TABS Take 325 mg by mouth daily.   Yes [provider]  JANUVIA 50 MG tablet Take 1 tablet (50 mg total) by mouth daily. 12/16/20  Yes Emokpae, Courage, MD  PARoxetine (PAXIL) 10 MG tablet Take 10 mg by mouth daily. 03/04/21  Yes [provider]  Skin Protectants, Misc. (BAZA PROTECT EX) Apply 1 application topically See admin instructions. Apply to affected areas of buttocks, sacrum, and genitals twice daily, may apply as needed with each incontinence episode   Yes [provider]    Inpatient Medications: Scheduled Meds:  aspirin EC  81 mg Oral Q breakfast   calcium carbonate  1 tablet Oral BID WC   carvedilol  6.25 mg Oral BID   ferrous sulfate  325 mg Oral Daily   Gerhardt's butt cream   Topical BID   heparin injection (subcutaneous)  5,000 Units Subcutaneous Q8H   insulin aspart  0-15 Units Subcutaneous TID WC   insulin aspart  0-5 Units Subcutaneous QHS   losartan  25 mg Oral Daily   mouth rinse  15 mL Mouth Rinse BID   oseltamivir  30 mg Oral BID   PARoxetine  10 mg Oral Daily   Continuous Infusions:  sodium chloride Stopped (03/18/21 1103)   cefTRIAXone (ROCEPHIN)  IV 1 g (03/21/21 1353)   PRN Meds: acetaminophen, hydrALAZINE, ipratropium-albuterol  Allergies:    Allergies  Allergen Reactions   Penicillins     unknown    Social History:   Social History   Socioeconomic History   Marital status: Married    Spouse name: Not on file   Number of children: Not on file   Years  of education: Not on file   Highest education level: Not on file  Occupational History   Not on file  Tobacco Use   Smoking status: Former    Types: Cigarettes    Quit date: 01/01/1956    Years since quitting: 65.2    Passive exposure: Never   Smokeless tobacco: Never  Vaping Use   Vaping Use: Never used  Substance and Sexual Activity   Alcohol use: No   Drug use: No   Sexual activity: Not Currently    Comment: MARRIED  Other Topics Concern   Not on file  Social History Narrative   Not on file   Social Determinants of Health   Financial Resource Strain: Not on file  Food Insecurity: Not on file  Transportation Needs: Not on file  Physical Activity: Not on file  Stress: Not on file  Social Connections: Not on file  Intimate Partner Violence: Not  on file    Family History:    Family History  Problem Relation Age of Onset   Heart disease Mother      ROS:  Please see the history of present illness.   All other ROS reviewed and negative.     Physical Exam/Data:   Vitals:   03/21/21 0509 03/21/21 0659 03/21/21 0915 03/21/21 1124  BP:  (!) 157/94 (!) 167/87 (!) 179/108  Pulse:   (!) 107 82  Resp:   18 (!) 22  Temp:    97.8 F (36.6 C)  TempSrc:    Oral  SpO2:  95% 96% 95%  Weight: 50.9 kg     Height:        Intake/Output Summary (Last 24 hours) at 03/21/2021 1534 Last data filed at 03/21/2021 1357 Gross per 24 hour  Intake 320 ml  Output 2250 ml  Net -1930 ml   Last 3 Weights 03/21/2021 03/20/2021 03/19/2021  Weight (lbs) 112 lb 4.8 oz 118 lb 14.4 oz 119 lb  Weight (kg) 50.939 kg 53.933 kg 53.978 kg     Body mass index is 17.59 kg/m.  General:  Well nourished, well developed, in no acute distress HEENT: normal Neck: no JVD Vascular: No carotid bruits; Distal pulses 2+ bilaterally Cardiac:  normal S1, S2; RRR; no murmur  Lungs:  clear to auscultation bilaterally, no wheezing, rhonchi or rales  Abd: soft, nontender, no hepatomegaly  Ext: no  edema Musculoskeletal:  No deformities, BUE and BLE strength normal and equal Skin: warm and dry  Neuro:  CNs 2-12 intact, no focal abnormalities noted Psych:  Normal affect   EKG:  The EKG was personally reviewed and demonstrates:  sinus rhythm with PACs and occasionally V paced rhythm Telemetry:  Telemetry was personally reviewed and demonstrates:  occasionally v-paced rhythm   Relevant CV Studies:  Echo 03/20/2021  1. Left ventricular ejection fraction, by estimation, is 35 to 40%. The  left ventricle has moderately decreased function. The left ventricle  demonstrates regional wall motion abnormalities (lateral hypokinesis).  There is moderate concentric left  ventricular hypertrophy. Left ventricular diastolic parameters are  indeterminate.   2. Right ventricular systolic function is normal. The right ventricular  size is normal.   3. Left atrial size was moderately dilated.   4. A small pericardial effusion is present. The pericardial effusion is  posterior to the left ventricle.   5. The mitral valve is degenerative. Moderate mitral valve regurgitation.  No evidence of mitral stenosis.   6. The aortic valve is tricuspid. Aortic valve regurgitation is trivial.  Aortic valve sclerosis is present, with no evidence of aortic valve  stenosis.   Laboratory Data:  High Sensitivity Troponin:   Recent Labs  Lab 03/03/21 1027  TROPONINIHS 40*     Chemistry Recent Labs  Lab 03/17/21 2340 03/19/21 0221 03/20/21 0445 03/21/21 0216  NA 132* 135 134* 135  K 3.6 3.1* 3.8 3.4*  CL 104 105 105 102  CO2 22 20* 20* 24  GLUCOSE 183* 131* 190* 153*  BUN 26* 21 21 19   CREATININE 0.90 0.84 0.69 0.89  CALCIUM 7.7* 8.3* 7.9* 8.1*  MG 1.9  --   --   --   GFRNONAA >60 >60 >60 >60  ANIONGAP 6 10 9 9     Recent Labs  Lab 03/17/21 1420  PROT 6.8  ALBUMIN 3.7  AST 13*  ALT 8  ALKPHOS 75  BILITOT 0.5   Lipids No results for input(s): CHOL, TRIG, HDL,  LABVLDL, LDLCALC, CHOLHDL in  the last 168 hours.  Hematology Recent Labs  Lab 03/17/21 1420 03/17/21 2340 03/19/21 0221  WBC 14.0* 9.2 8.1  RBC 4.14* 3.62* 3.86*  HGB 12.8* 11.2* 11.8*  HCT 39.8 34.9* 36.3*  MCV 96.1 96.4 94.0  MCH 30.9 30.9 30.6  MCHC 32.2 32.1 32.5  RDW 15.7* 15.7* 15.6*  PLT 203 179 158   Thyroid No results for input(s): TSH, FREET4 in the last 168 hours.  BNPNo results for input(s): BNP, PROBNP in the last 168 hours.  DDimer No results for input(s): DDIMER in the last 168 hours.   Radiology/Studies:  DG Chest 2 View  Result Date: 03/20/2021 CLINICAL DATA:  Shortness of breath, history of flu in a 87 year old male. EXAM: CHEST - 2 VIEW COMPARISON:  March 17, 2021. FINDINGS: LEFT-sided dual lead pacer device remains in place. EKG leads project over the chest. Cardiomediastinal contours and hilar structures are stable. Increasing density in the retrocardiac region on the LEFT. Small LEFT-sided pleural effusion associated with this finding. No signs of frank consolidation in the RIGHT chest. Perhaps subtle airspace disease at the RIGHT lung base since previous imaging, also in the RIGHT upper lobe as well. On limited assessment there is no acute skeletal process. IMPRESSION: Increasing density in the retrocardiac region on the LEFT. Small LEFT-sided pleural effusion associated with this finding. Findings suspicious for developing pneumonia. Subtle airspace disease the RIGHT lung base and mild interstitial and alveolar opacification in the RIGHT upper chest suspicious for multifocal involvement. Electronically Signed   By: Zetta Bills M.D.   On: 03/20/2021 08:20   ECHOCARDIOGRAM COMPLETE  Result Date: 03/20/2021    ECHOCARDIOGRAM REPORT   Patient Name:   Nathan Alvarez Date of Exam: 03/20/2021 Medical Rec #:  [id number]      Height:       67.0 in Accession #:    [id number]     Weight:       118.9 lb Date of Birth:  09-Aug-1924     BSA:          1.621 m Patient Age:    40 years       BP:            172/98 mmHg Patient Gender: M              HR:           65 bpm. Exam Location:  Inpatient Procedure: 2D Echo, Color Doppler and Cardiac Doppler Indications:    CHF  History:        Patient has no prior history of Echocardiogram examinations.                 Risk Factors:Hypertension and Diabetes.  Sonographer:    Jyl Heinz Referring Phys: [place]  1. Left ventricular ejection fraction, by estimation, is 35 to 40%. The left ventricle has moderately decreased function. The left ventricle demonstrates regional wall motion abnormalities (lateral hypokinesis). There is moderate concentric left ventricular hypertrophy. Left ventricular diastolic parameters are indeterminate.  2. Right ventricular systolic function is normal. The right ventricular size is normal.  3. Left atrial size was moderately dilated.  4. A small pericardial effusion is present. The pericardial effusion is posterior to the left ventricle.  5. The mitral valve is degenerative. Moderate mitral valve regurgitation. No evidence of mitral stenosis.  6. The aortic valve is tricuspid. Aortic valve regurgitation is trivial. Aortic valve sclerosis is present, with no evidence  of aortic valve stenosis. FINDINGS  Left Ventricle: Left ventricular ejection fraction, by estimation, is 35 to 40%. The left ventricle has moderately decreased function. The left ventricle demonstrates regional wall motion abnormalities. The left ventricular internal cavity size was normal in size. There is moderate concentric left ventricular hypertrophy. Left ventricular diastolic parameters are indeterminate.  LV Wall Scoring: The entire lateral wall is hypokinetic. Right Ventricle: The right ventricular size is normal. No increase in right ventricular wall thickness. Right ventricular systolic function is normal. Left Atrium: Left atrial size was moderately dilated. Right Atrium: Right atrial size was normal in size. Pericardium: A small pericardial  effusion is present. The pericardial effusion is posterior to the left ventricle. Mitral Valve: The mitral valve is degenerative in appearance. Moderate mitral valve regurgitation. No evidence of mitral valve stenosis. Tricuspid Valve: The tricuspid valve is normal in structure. Tricuspid valve regurgitation is mild . No evidence of tricuspid stenosis. Aortic Valve: The aortic valve is tricuspid. There is mild to moderate aortic valve annular calcification. Aortic valve regurgitation is trivial. Aortic regurgitation PHT measures 540 msec. Aortic valve sclerosis is present, with no evidence of aortic valve stenosis. Aortic valve peak gradient measures 5.5 mmHg. Pulmonic Valve: The pulmonic valve was not well visualized. Pulmonic valve regurgitation is mild to moderate. No evidence of pulmonic stenosis. Aorta: The aortic root and ascending aorta are structurally normal, with no evidence of dilitation. IAS/Shunts: The atrial septum is grossly normal.  LEFT VENTRICLE PLAX 2D LVIDd:         4.90 cm      Diastology LVIDs:         3.80 cm      LV e' medial:    4.13 cm/s LV PW:         1.30 cm      LV E/e' medial:  13.6 LV IVS:        1.40 cm      LV e' lateral:   9.46 cm/s LVOT diam:     1.80 cm      LV E/e' lateral: 5.9 LV SV:         36 LV SV Index:   22 LVOT Area:     2.54 cm  LV Volumes (MOD) LV vol d, MOD A2C: 98.0 ml LV vol d, MOD A4C: 113.0 ml LV vol s, MOD A2C: 65.1 ml LV vol s, MOD A4C: 76.8 ml LV SV MOD A2C:     32.9 ml LV SV MOD A4C:     113.0 ml LV SV MOD BP:      35.5 ml RIGHT VENTRICLE RV Basal diam:  3.70 cm RV Mid diam:    2.50 cm RV S prime:     14.90 cm/s TAPSE (M-mode): 2.0 cm LEFT ATRIUM             Index        RIGHT ATRIUM           Index LA diam:        4.50 cm 2.78 cm/m   RA Area:     17.60 cm LA Vol (A2C):   51.8 ml 31.95 ml/m  RA Volume:   46.40 ml  28.62 ml/m LA Vol (A4C):   69.4 ml 42.81 ml/m LA Biplane Vol: 64.9 ml 40.03 ml/m  AORTIC VALVE AV Area (Vmax): 1.54 cm AV Vmax:        117.00  cm/s AV Peak Grad:   5.5 mmHg LVOT Vmax:  71.00 cm/s LVOT Vmean:     45.900 cm/s LVOT VTI:       0.143 m AI PHT:         540 msec  AORTA Ao Root diam: 3.50 cm Ao Asc diam:  3.10 cm MITRAL VALVE               TRICUSPID VALVE MV Area (PHT): 3.48 cm    TR Peak grad:   39.9 mmHg MV Decel Time: 218 msec    TR Vmax:        316.00 cm/s MR Peak grad: 134.3 mmHg MR Mean grad: 92.5 mmHg    SHUNTS MR Vmax:      579.50 cm/s  Systemic VTI:  0.14 m MR Vmean:     465.5 cm/s   Systemic Diam: 1.80 cm MV E velocity: 56.20 cm/s MV A velocity: 57.20 cm/s MV E/A ratio:  0.98 Riley Lam MD Electronically signed by Riley Lam MD Signature Date/Time: 03/20/2021/7:55:43 PM    Final    DG ESOPHAGUS W SINGLE CM (SOL OR THIN BA)  Result Date: 03/21/2021 CLINICAL DATA:  Difficulty swallowing EXAM: ESOPHOGRAM/BARIUM SWALLOW TECHNIQUE: Single contrast examination was performed using  thin barium. FLUOROSCOPY TIME:  Fluoroscopy Time:  2 minutes 54 seconds Radiation Exposure Index (if provided by the fluoroscopic device): 12.0 mGy Number of Acquired Spot Images: 7 series, 346 images. This includes multiple cine clips. COMPARISON:  None. FINDINGS: There is severe esophageal dysmotility with delayed distal progression of contrast into the stomach. No visible ulceration. Small hiatal hernia. Gastroesophageal reflux occurred to the midesophagus. A 13 mm barium tablet became lodged in the distal esophagus just above the GE junction and did not pass within 10 minutes. Esophageal dysmotility took the pill to and fro from the mid to distal esophagus. IMPRESSION: Severe esophageal dysmotility. Small hiatal hernia. Gastroesophageal reflux occurred to the midesophagus. A 13 mm barium tablet stuck in the distal esophagus just above the GE junction and did not pass within 10 minutes. This is favored to be due to severe esophageal dysmotility, however a distal esophageal stricture cannot be excluded. Electronically Signed   By:  Caprice Renshaw M.D.   On: 03/21/2021 10:09     Assessment and Plan:   Acute systolic heart failure: EF was 65% on 2018 echocardiogram performed in [place].  Patient was admitted to the hospital with flu A, repeat echocardiogram showed EF 35 to 40%.  He denies any recent chest pain.  Given his advanced age of 87 years old, he has refused any invasive study which I think is quite reasonable given lack of chest pain.  He is on carvedilol, may consider a low-dose ARB as outpatient.  Would avoid lisinopril given esophageal dysmotility issue and trouble swallowing pills.  We can consider repeat echocardiogram in 3 months  Sick sinus syndrome s/p Medtronic dual-chamber PPM: Device has reached ERI since May 2022.  Prior to discharge, please notify cardiology service so EP service can arrange device change out 1 to 2 weeks after discharge.  Patient is not device dependent.  Flu A with possible CAP: per hospitalist service  Hypertension  Hyperlipidemia  Atrial fibrillation: Dr. Lubertha Basque note from 2019 questions a diagnosis of atrial fibrillation and is no A. fib has been recorded on the previous interrogation.  Patient is not on any anticoagulation therapy either.   Risk Assessment/Risk Scores:        New York Heart Association (NYHA) Functional Class NYHA Class II  For questions or updates, please contact [facility] Please consult [email or web address] for contact info under    Hilbert Corrigan, [place]  03/21/2021 3:34 PM  Patient seen and examined.  Agree with below documentation.  Nathan Alvarez is a 87 year old male with history of sick sinus syndrome status post PPM in 2011, hypertension who we are consulted for evaluation of heart failure at the request of Dr. Algis Liming.  Most recent echocardiogram was in 2018, which showed normal LV systolic function.  He reports that he had misplaced his device monitor, so device clinic was only recently informed that ERI occurred in May 2022.  He was  seen by EP on 03/14/2021, his device was not tested as since device at [facility] wanted to preserve battery.  He was scheduled for generator change with Dr. Lovena Alvarez this week.  However 1 day prior to his procedure, he presented with shortness of breath to [facility].  He was found to be febrile to 101.7 F, procalcitonin elevated 8.9, and lactate 3.9.  Found to be positive for influenza A.  Chest x-ray with multifocal pneumonia.  Echocardiogram on 12/14 showed EF 35 to 40%, lateral hypokinesis, moderate LVH, normal RV function, has small pericardial effusion, moderate MR.  He denies any chest pain.  EKG shows intermittently V paced rhythm, PVCs, left axis deviation.  Patient seen and examined.  Agree with above documentation.  On exam, patient is alert and oriented, regular rate and rhythm, no murmurs, lungs CTAB, no Alvarez edema.  For his heart failure, he appears euvolemic.  He declines any invasive work-up, which is reasonable given his age and lack of symptoms.  We will plan conservative management.  He is currently on carvedilol, will plan to add losartan as he recovers from influenza.  Can plan repeat echocardiogram in 3 months to see if improvement in systolic function.  Donato Heinz, MD

## 2021-03-22 ENCOUNTER — Telehealth: Payer: Self-pay

## 2021-03-22 ENCOUNTER — Other Ambulatory Visit (HOSPITAL_COMMUNITY): Payer: Self-pay

## 2021-03-22 DIAGNOSIS — L89312 Pressure ulcer of right buttock, stage 2: Secondary | ICD-10-CM

## 2021-03-22 DIAGNOSIS — R1319 Other dysphagia: Secondary | ICD-10-CM

## 2021-03-22 LAB — CULTURE, BLOOD (ROUTINE X 2)
Culture: NO GROWTH
Special Requests: ADEQUATE

## 2021-03-22 LAB — BASIC METABOLIC PANEL
Anion gap: 9 (ref 5–15)
BUN: 17 mg/dL (ref 8–23)
CO2: 25 mmol/L (ref 22–32)
Calcium: 8.2 mg/dL — ABNORMAL LOW (ref 8.9–10.3)
Chloride: 102 mmol/L (ref 98–111)
Creatinine, Ser: 0.8 mg/dL (ref 0.61–1.24)
GFR, Estimated: >60 mL/min (ref >60)
Glucose, Bld: 75 mg/dL (ref 70–99)
Potassium: 3 mmol/L — ABNORMAL LOW (ref 3.5–5.1)
Sodium: 136 mmol/L (ref 135–145)

## 2021-03-22 LAB — MAGNESIUM: Magnesium: 1.8 mg/dL (ref 1.7–2.4)

## 2021-03-22 LAB — RESP PANEL BY RT-PCR (FLU A&B, COVID) ARPGX2
Influenza A by PCR: POSITIVE — AB
Influenza B by PCR: NEGATIVE
SARS Coronavirus 2 by RT PCR: NEGATIVE

## 2021-03-22 LAB — GLUCOSE, CAPILLARY
Glucose-Capillary: 95 mg/dL (ref 70–99)
Glucose-Capillary: 106 mg/dL — ABNORMAL HIGH (ref 70–99)

## 2021-03-22 MED ORDER — CHLORHEXIDINE GLUCONATE 0.12 % MT SOLN
15.0000 mL | Freq: Two times a day (BID) | OROMUCOSAL | Status: DC
  Administered 2021-03-22: 15 mL via OROMUCOSAL
  Filled 2021-03-22: qty 15

## 2021-03-22 MED ORDER — ORAL CARE MOUTH RINSE
15.0000 mL | Freq: Two times a day (BID) | OROMUCOSAL | Status: DC
  Administered 2021-03-22: 15 mL via OROMUCOSAL

## 2021-03-22 MED ORDER — GERHARDT'S BUTT CREAM: 1.0000 "application " | TOPICAL_CREAM | Freq: Two times a day (BID) | CUTANEOUS | 0 refills | Status: DC

## 2021-03-22 MED ORDER — POTASSIUM CHLORIDE 20 MEQ PO PACK
40.0000 meq | PACK | ORAL | Status: AC
  Administered 2021-03-22 (×2): 40 meq via ORAL
  Filled 2021-03-22 (×2): qty 2

## 2021-03-22 MED ORDER — LOSARTAN POTASSIUM 25 MG PO TABS
25.0000 mg | ORAL_TABLET | Freq: Every day | ORAL | 1 refills | Status: DC
  Filled 2021-03-22: qty 30, 30d supply, fill #0

## 2021-03-22 NOTE — Progress Notes (Signed)
Attempted to call [facility] to give report. Phone continued to ring, no one answered. No voicemail picked up. Reviewed discharge instructions with pt's daughter in law, Hilda Lias. IV removed.

## 2021-03-22 NOTE — Progress Notes (Signed)
Progress Note  Patient Name: Nathan Alvarez Date of Encounter: 03/22/2021  [facility] Cardiologist: Cristopher Peru, MD   Subjective   Denies any chest pain  Inpatient Medications    Scheduled Meds:  aspirin EC  81 mg Oral Q breakfast   calcium carbonate  1 tablet Oral BID WC   carvedilol  6.25 mg Oral BID   chlorhexidine  15 mL Mouth Rinse BID   ferrous sulfate  325 mg Oral Daily   Gerhardt's butt cream   Topical BID   heparin injection (subcutaneous)  5,000 Units Subcutaneous Q8H   insulin aspart  0-15 Units Subcutaneous TID WC   insulin aspart  0-5 Units Subcutaneous QHS   losartan  25 mg Oral Daily   mouth rinse  15 mL Mouth Rinse q12n4p   PARoxetine  10 mg Oral Daily   Continuous Infusions:  sodium chloride Stopped (03/18/21 1103)   cefTRIAXone (ROCEPHIN)  IV 1 g (03/21/21 1353)   PRN Meds: acetaminophen, hydrALAZINE, ipratropium-albuterol   Vital Signs    Vitals:   03/22/21 0600 03/22/21 0630 03/22/21 0631 03/22/21 1104  BP:  (!) 176/98 (!) 175/83   Pulse:      Resp: 17 (!) 21 20 20   Temp:    97.9 F (36.6 C)  TempSrc:    Oral  SpO2:      Weight:      Height:        Intake/Output Summary (Last 24 hours) at 03/22/2021 1137 Last data filed at 03/22/2021 1028 Gross per 24 hour  Intake 340 ml  Output 1350 ml  Net -1010 ml   Last 3 Weights 03/22/2021 03/21/2021 03/20/2021  Weight (lbs) 109 lb 9.6 oz 112 lb 4.8 oz 118 lb 14.4 oz  Weight (kg) 49.714 kg 50.939 kg 53.933 kg      Telemetry    Sinus rhythm, intermittently V paced- Personally Reviewed  ECG    No new ECG- Personally Reviewed  Physical Exam   GEN: No acute distress.   Neck: No JVD Cardiac: RRR, no murmurs, rubs, or gallops.  Respiratory: Clear to auscultation bilaterally. GI: Soft, nontender, non-distended  MS: No edema; No deformity. Neuro:  Nonfocal  Psych: Normal affect   Labs    High Sensitivity Troponin:   Recent Labs  Lab 03/03/21 1027  TROPONINIHS 40*      Chemistry Recent Labs  Lab 03/17/21 1420 03/17/21 2340 03/19/21 0221 03/20/21 0445 03/21/21 0216 03/22/21 0149  NA 134* 132*   < > 134* 135 136  K 4.2 3.6   < > 3.8 3.4* 3.0*  CL 96* 104   < > 105 102 102  CO2 28 22   < > 20* 24 25  GLUCOSE 212* 183*   < > 190* 153* 75  BUN 27* 26*   < > 21 19 17   CREATININE 0.99 0.90   < > 0.69 0.89 0.80  CALCIUM 9.1 7.7*   < > 7.9* 8.1* 8.2*  MG  --  1.9  --   --   --  1.8  PROT 6.8  --   --   --   --   --   ALBUMIN 3.7  --   --   --   --   --   AST 13*  --   --   --   --   --   ALT 8  --   --   --   --   --   ALKPHOS 75  --   --   --   --   --  BILITOT 0.5  --   --   --   --   --   GFRNONAA >60 >60   < > >60 >60 >60  ANIONGAP 10 6   < > 9 9 9    < > = values in this interval not displayed.    Lipids No results for input(s): CHOL, TRIG, HDL, LABVLDL, LDLCALC, CHOLHDL in the last 168 hours.  Hematology Recent Labs  Lab 03/17/21 1420 03/17/21 2340 03/19/21 0221  WBC 14.0* 9.2 8.1  RBC 4.14* 3.62* 3.86*  HGB 12.8* 11.2* 11.8*  HCT 39.8 34.9* 36.3*  MCV 96.1 96.4 94.0  MCH 30.9 30.9 30.6  MCHC 32.2 32.1 32.5  RDW 15.7* 15.7* 15.6*  PLT 203 179 158   Thyroid No results for input(s): TSH, FREET4 in the last 168 hours.  BNPNo results for input(s): BNP, PROBNP in the last 168 hours.  DDimer No results for input(s): DDIMER in the last 168 hours.   Radiology    ECHOCARDIOGRAM COMPLETE  Result Date: 03/20/2021    ECHOCARDIOGRAM REPORT   Patient Name:   Nathan Alvarez Date of Exam: 03/20/2021 Medical Rec #:  03/22/2021      Height:       67.0 in Accession #:    [id number]     Weight:       118.9 lb Date of Birth:  [birth year]     BSA:          1.621 m Patient Age:    87 years       BP:           172/98 mmHg Patient Gender: M              HR:           65 bpm. Exam Location:  Inpatient Procedure: 2D Echo, Color Doppler and Cardiac Doppler Indications:    CHF  History:        Patient has no prior history of Echocardiogram examinations.                  Risk Factors:Hypertension and Diabetes.  Sonographer:    [birth year]19[birth year] Referring Phys: Cleatis Polka ANAND D HONGALGI IMPRESSIONS  1. Left ventricular ejection fraction, by estimation, is 35 to 40%. The left ventricle has moderately decreased function. The left ventricle demonstrates regional wall motion abnormalities (lateral hypokinesis). There is moderate concentric left ventricular hypertrophy. Left ventricular diastolic parameters are indeterminate.  2. Right ventricular systolic function is normal. The right ventricular size is normal.  3. Left atrial size was moderately dilated.  4. A small pericardial effusion is present. The pericardial effusion is posterior to the left ventricle.  5. The mitral valve is degenerative. Moderate mitral valve regurgitation. No evidence of mitral stenosis.  6. The aortic valve is tricuspid. Aortic valve regurgitation is trivial. Aortic valve sclerosis is present, with no evidence of aortic valve stenosis. FINDINGS  Left Ventricle: Left ventricular ejection fraction, by estimation, is 35 to 40%. The left ventricle has moderately decreased function. The left ventricle demonstrates regional wall motion abnormalities. The left ventricular internal cavity size was normal in size. There is moderate concentric left ventricular hypertrophy. Left ventricular diastolic parameters are indeterminate.  LV Wall Scoring: The entire lateral wall is hypokinetic. Right Ventricle: The right ventricular size is normal. No increase in right ventricular wall thickness. Right ventricular systolic function is normal. Left Atrium: Left atrial size was moderately dilated. Right Atrium: Right atrial size was normal in size. Pericardium: A small  pericardial effusion is present. The pericardial effusion is posterior to the left ventricle. Mitral Valve: The mitral valve is degenerative in appearance. Moderate mitral valve regurgitation. No evidence of mitral valve stenosis. Tricuspid Valve: The tricuspid  valve is normal in structure. Tricuspid valve regurgitation is mild . No evidence of tricuspid stenosis. Aortic Valve: The aortic valve is tricuspid. There is mild to moderate aortic valve annular calcification. Aortic valve regurgitation is trivial. Aortic regurgitation PHT measures 540 msec. Aortic valve sclerosis is present, with no evidence of aortic valve stenosis. Aortic valve peak gradient measures 5.5 mmHg. Pulmonic Valve: The pulmonic valve was not well visualized. Pulmonic valve regurgitation is mild to moderate. No evidence of pulmonic stenosis. Aorta: The aortic root and ascending aorta are structurally normal, with no evidence of dilitation. IAS/Shunts: The atrial septum is grossly normal.  LEFT VENTRICLE PLAX 2D LVIDd:         4.90 cm      Diastology LVIDs:         3.80 cm      LV e' medial:    4.13 cm/s LV PW:         1.30 cm      LV E/e' medial:  13.6 LV IVS:        1.40 cm      LV e' lateral:   9.46 cm/s LVOT diam:     1.80 cm      LV E/e' lateral: 5.9 LV SV:         36 LV SV Index:   22 LVOT Area:     2.54 cm  LV Volumes (MOD) LV vol d, MOD A2C: 98.0 ml LV vol d, MOD A4C: 113.0 ml LV vol s, MOD A2C: 65.1 ml LV vol s, MOD A4C: 76.8 ml LV SV MOD A2C:     32.9 ml LV SV MOD A4C:     113.0 ml LV SV MOD BP:      35.5 ml RIGHT VENTRICLE RV Basal diam:  3.70 cm RV Mid diam:    2.50 cm RV S prime:     14.90 cm/s TAPSE (M-mode): 2.0 cm LEFT ATRIUM             Index        RIGHT ATRIUM           Index LA diam:        4.50 cm 2.78 cm/m   RA Area:     17.60 cm LA Vol (A2C):   51.8 ml 31.95 ml/m  RA Volume:   46.40 ml  28.62 ml/m LA Vol (A4C):   69.4 ml 42.81 ml/m LA Biplane Vol: 64.9 ml 40.03 ml/m  AORTIC VALVE AV Area (Vmax): 1.54 cm AV Vmax:        117.00 cm/s AV Peak Grad:   5.5 mmHg LVOT Vmax:      71.00 cm/s LVOT Vmean:     45.900 cm/s LVOT VTI:       0.143 m AI PHT:         540 msec  AORTA Ao Root diam: 3.50 cm Ao Asc diam:  3.10 cm MITRAL VALVE               TRICUSPID VALVE MV Area (PHT): 3.48  cm    TR Peak grad:   39.9 mmHg MV Decel Time: 218 msec    TR Vmax:        316.00 cm/s MR Peak grad: 134.3 mmHg MR Mean grad: 92.5  mmHg    SHUNTS MR Vmax:      579.50 cm/s  Systemic VTI:  0.14 m MR Vmean:     465.5 cm/s   Systemic Diam: 1.80 cm MV E velocity: 56.20 cm/s MV A velocity: 57.20 cm/s MV E/A ratio:  0.98 Rudean Haskell MD Electronically signed by Rudean Haskell MD Signature Date/Time: 03/20/2021/7:55:43 PM    Final    DG ESOPHAGUS W SINGLE CM (SOL OR THIN BA)  Result Date: 03/21/2021 CLINICAL DATA:  Difficulty swallowing EXAM: ESOPHOGRAM/BARIUM SWALLOW TECHNIQUE: Single contrast examination was performed using  thin barium. FLUOROSCOPY TIME:  Fluoroscopy Time:  2 minutes 54 seconds Radiation Exposure Index (if provided by the fluoroscopic device): 12.0 mGy Number of Acquired Spot Images: 7 series, 346 images. This includes multiple cine clips. COMPARISON:  None. FINDINGS: There is severe esophageal dysmotility with delayed distal progression of contrast into the stomach. No visible ulceration. Small hiatal hernia. Gastroesophageal reflux occurred to the midesophagus. A 13 mm barium tablet became lodged in the distal esophagus just above the GE junction and did not pass within 10 minutes. Esophageal dysmotility took the pill to and fro from the mid to distal esophagus. IMPRESSION: Severe esophageal dysmotility. Small hiatal hernia. Gastroesophageal reflux occurred to the midesophagus. A 13 mm barium tablet stuck in the distal esophagus just above the GE junction and did not pass within 10 minutes. This is favored to be due to severe esophageal dysmotility, however a distal esophageal stricture cannot be excluded. Electronically Signed   By: Maurine Simmering M.D.   On: 03/21/2021 10:09    Cardiac Studies     Patient Profile     87 y.o. male with a hx of hypertension, hyperlipidemia, atrial fibrillation and history of SSS s/p Medtronic dual-chamber PPM 2011 who is being seen 03/21/2021  for the evaluation of new LV dysfunction   Assessment & Plan     Acute systolic heart failure: EF was 65% on 2018 echocardiogram performed in [place].  Patient was admitted to the hospital with flu A, repeat echocardiogram showed EF 35 to 40%.  He denies any recent chest pain.  Given his advanced age of 87 years old, he has refused any invasive study which I think is quite reasonable given lack of chest pain.  He is on carvedilol and losartan 25 mg daily.  We can consider repeat echocardiogram in 3 months   Sick sinus syndrome s/p Medtronic dual-chamber PPM: Device has reached ERI since May 2022.  Patient is not device dependent.   -Discussed with the EP, they will reschedule him for generator change in a few weeks   Flu A with possible CAP: per hospitalist service   Atrial fibrillation: Dr. Tanna Furry note from 2019 questions a diagnosis of atrial fibrillation and is no A. fib has been recorded on the previous interrogation.  Patient is not on any anticoagulation therapy either. -On telemetry, no clear A. fib.  Appears to be sinus rhythm with intermittent V pacing  [facility] will sign off.   Medication Recommendations: Carvedilol 6.25 mg twice daily, losartan 25 mg daily Other recommendations (labs, testing, etc): BMET within 1 week Follow up as an outpatient: We will schedule   For questions or updates, please contact [place] Please consult [email or web address] for contact info under        Signed, Donato Heinz, MD  03/22/2021, 11:37 AM

## 2021-03-22 NOTE — Discharge Summary (Signed)
Physician Discharge Summary  Nathan Alvarez [id number] DOB: 07-06-1924  PCP: Curly Rim, MD  Admitted from: ALF Discharged to: ALF  Admit date: 03/17/2021 Discharge date: 03/22/2021  Recommendations for Outpatient Follow-up:    Follow-up Information     Corrington, Kip A, MD. Schedule an appointment as soon as possible for a visit in 1 week(s).   Specialty: Family Medicine Why: To be seen with repeat labs (CBC & BMP). Contact information: [facility] [address] [place] [address] [phone]         Evans Lance, MD. Call.   Specialty: Cardiology Why: MDs office will call to arrange outpatient visit for pacemaker battery change. Contact information: [id number] [address] [place] Orders (From admission, onward)     Start     Ordered   03/22/21 [place]  At discharge       Question Answer Comment  To provide the following care/treatments PT   To provide the following care/treatments OT      03/22/21 1226             Equipment/Devices: None    Discharge Condition: Improved and stable.   Code Status: DNR Diet recommendation:  Discharge Diet Orders (From admission, onward)     Start     Ordered   03/22/21 0000  Diet - low sodium heart healthy        03/22/21 1226   03/22/21 0000  Diet Carb Modified        03/22/21 1226             Discharge Diagnoses:  Principal Problem:   Influenza A Active Problems:   Pressure injury of skin   Hypotension   Septic shock (HCC)   Acute respiratory failure with hypoxia (HCC)   Lactic acidosis   Dysphagia   Abnormal barium swallow   Acute systolic heart failure (HCC)   Brief Summary: Nathan Alvarez, is a 87 y.o. male, recent resident of an assisted living facility, with past medical history of Afib, Sick sinus syndrome s/p pacemaker, DM2, GERD, HB, HTN, HLD, chronic anemia, who presented to  the [facility] with a chief complaint of hypoxia.  Of note patient was scheduled to have pacemaker battery replaced on 12/12.   ED course was notable for a tmax of 101.7, lactic level of 3.9. The patient became hypotensive (80/50). A code sepsis was initated.  1 L IVF was given. PCR resulted as Flu A +. Blood cultures and urine cultures were obtained. The patient was started on vanc, cefepime, flagyl, tamiflu.  He was admitted to PCCM since he required vasopressors for septic shock.  Post stabilization, he was transferred to [facility].   Assessment & Plan:   Septic shock and acute hypoxic respiratory failure secondary to influenza pneumonia and possibly community-acquired pneumonia: Procalcitonin 8.89.  Came in with leukocytosis which has resolved.  Weaned off of vasopressors 03/18/2021.  Septic shock and acute respiratory failure have resolved.  Patient has completed total 5 days of IV antibiotics for his pneumonia and 5 days course of Tamiflu for influenza.  No further antibiotics at discharge.  Lactic acidosis resolved.  Chest x-ray 12/14 personally reviewed: Increasing density in the retrocardiac region on the left.  Small left-sided pleural effusion.  Findings suspicious for developing pneumonia.  Subtle ASD in the right lung base  and mild interstitial and alveolar opacification in the right upper chest suspicious for multifocal involvement.  However I was concerned about some pulmonary edema.  IV Lasix 20 mg x 2 with significant improvement.  Recommend repeating chest x-ray in 4 weeks to ensure resolution of abnormal findings.   New acute systolic CHF/new cardiomyopathy: Unable to appreciate 2D echo results from OSH 2018.  2D echo 12/14: LVEF 35-40%, lateral wall hypokinesis and moderate LVH.  Discontinued IV fluids.  IV Lasix 20 mg every 12 hours x2 doses.  Clinically euvolemic.  Communicated with cardiology and their input appreciated.  They indicate that EF was 65% by echocardiogram in 2018, performed in  [place].  Given his advanced age of 87 years old, patient has refused any invasive study which is quite reasonable given lack of chest pain.  They recommend continuing current doses of carvedilol and losartan and consider repeating 2D echocardiogram in 3 months.   Essential hypertension: Continue carvedilol 6.25 mg twice daily.  Added losartan 25 Mg daily.  Monitor outpatient closely and titrate as deemed necessary.   Dysphagia secondary to severe esophageal dysmotility: Reported 12/14.  Could be related to poor oral hygiene complicating underlying severe esophageal dysmotility.  SLP input appreciated, suspecting esophageal cause, obtained barium swallow. Barium swallow suggested esophageal dysmotility versus unable to rule out stricture in the distal esophagus.  [facility] input appreciated.  They discussed with patient/family who were insistent on no aggressive evaluation i.e. EGD.  They indicate he likely has severe dysmotility and recommend soft diet.  Also according to them, CT scan a few months ago argues against malignancy but cannot say for sure without direct visualization.  GI signed off.   Type 2 diabetes mellitus, poorly controlled with hyperglycemia: Continue prior home dose of Januvia.  Monitor CBGs.   History of A. fib and sick sinus syndrome s/p pacemaker:  As per cardiology signoff note, Dr. Tanna Furry note from 2019 questions a diagnosis of atrial fibrillation and no A. fib has been recorded on the previous interrogation.  Patient is not on anticoagulation therapy.  On telemetry, no clear A. fib.  Appears to be in sinus rhythm with intermittent V pacing.  [employer]tronic dual-chamber PPM device has reached ERI since May 2022.  Patient is not device dependent and EP Cardiology will reschedule him for generator exchange in a few weeks.  Daughter-in-law also will coordinate this.  Continue carvedilol.   Hypokalemia: Replaced aggressively prior to discharge.  Magnesium 1.8.  Follow BMP as  outpatient.   History of depression: Continue Paxil   Anemia: Stable.   Body mass index is 17.59 kg/m.   Pressure Injury 01/21/21 Sacrum Lower Stage 2 -  Partial thickness loss of dermis presenting as a shallow open injury with a red, pink wound bed without slough. (Active)  01/21/21 0230  Location: Sacrum  Location Orientation: Lower  Staging: Stage 2 -  Partial thickness loss of dermis presenting as a shallow open injury with a red, pink wound bed without slough.  Wound Description (Comments):   Present on Admission: Yes     Pressure Injury 03/17/21 Buttocks Right Stage 2 -  Partial thickness loss of dermis presenting as a shallow open injury with a red, pink wound bed without slough. (Active)  03/17/21 2200  Location: Buttocks  Location Orientation: Right  Staging: Stage 2 -  Partial thickness loss of dermis presenting as a shallow open injury with a red, pink wound bed without slough.  Wound Description (Comments):   Present on Admission:  Yes     Pressure Injury 03/17/21 Buttocks Left Stage 2 -  Partial thickness loss of dermis presenting as a shallow open injury with a red, pink wound bed without slough. (Active)  03/17/21 2200  Location: Buttocks  Location Orientation: Left  Staging: Stage 2 -  Partial thickness loss of dermis presenting as a shallow open injury with a red, pink wound bed without slough.  Wound Description (Comments):   Present on Admission: Yes          Consultants:  Cardiology   Procedures:  None   Discharge Instructions  Discharge Instructions     (HEART FAILURE PATIENTS) Call MD:  Anytime you have any of the following symptoms: 1) 3 pound weight gain in 24 hours or 5 pounds in 1 week 2) shortness of breath, with or without a dry hacking cough 3) swelling in the hands, feet or stomach 4) if you have to sleep on extra pillows at night in order to breathe.   Complete by: As directed    Call MD for:  difficulty breathing, headache or visual  disturbances   Complete by: As directed    Call MD for:  extreme fatigue   Complete by: As directed    Call MD for:  persistant dizziness or light-headedness   Complete by: As directed    Call MD for:  persistant nausea and vomiting   Complete by: As directed    Call MD for:  redness, tenderness, or signs of infection (pain, swelling, redness, odor or green/yellow discharge around incision site)   Complete by: As directed    Call MD for:  severe uncontrolled pain   Complete by: As directed    Call MD for:  temperature >100.4   Complete by: As directed    Diet - low sodium heart healthy   Complete by: As directed    Diet Carb Modified   Complete by: As directed    Discharge instructions   Complete by: As directed    Diet consistency recommendations as per speech therapy evaluation as follows:  Diet recommendations: Dysphagia 2 (fine chop);Thin liquid Liquids provided via: Cup;Straw Medication Administration: Crushed with puree Supervision: Patient able to self feed;Intermittent supervision to cue for compensatory strategies Compensations: Slow rate;Small sips/bites;Follow solids with liquid Postural Changes and/or Swallow Maneuvers: Seated upright 90 degrees;Upright 30-60 min after meal   Discharge wound care:   Complete by: As directed    Wound care  Every shift      Comments: Cleanse buttocks with soap and water and pat dry Apply Gerhardts butt paste twice daily. And prn soilage incontinence/loose stool.  Low air loss mattress.  Turn and reposition every two hours.   Increase activity slowly   Complete by: As directed         Medication List     TAKE these medications    acetaminophen 325 MG tablet Commonly known as: TYLENOL Take 2 tablets (650 mg total) by mouth every 6 (six) hours as needed for mild pain (or Fever >/= 101).   aspirin 81 MG EC tablet Take 1 tablet (81 mg total) by mouth daily with breakfast. Swallow whole.   BAZA PROTECT EX Apply 1 application  topically See admin instructions. Apply to affected areas of buttocks, sacrum, and genitals twice daily, may apply as needed with each incontinence episode   calcium carbonate 1250 (500 Ca) MG tablet Commonly known as: OS-CAL - dosed in mg of elemental calcium Take 1 tablet (500 mg of elemental  calcium total) by mouth 2 (two) times daily with a meal.   carvedilol 6.25 MG tablet Commonly known as: COREG Take 1 tablet (6.25 mg total) by mouth 2 (two) times daily. For BP and Heart   Gerhardt's butt cream Crea Apply 1 application topically 2 (two) times daily. Each shift and prn incontinence.   Iron 325 (65 Fe) MG Tabs Take 325 mg by mouth daily.   Januvia 50 MG tablet Generic drug: sitaGLIPtin Take 1 tablet (50 mg total) by mouth daily.   losartan 25 MG tablet Commonly known as: COZAAR Take 1 tablet (25 mg total) by mouth daily. Start taking on: March 23, 2021   PARoxetine 10 MG tablet Commonly known as: PAXIL Take 10 mg by mouth daily.       Allergies  Allergen Reactions   Penicillins     unknown      Procedures/Studies: DG Chest 2 View  Result Date: 03/20/2021 CLINICAL DATA:  Shortness of breath, history of flu in a 87 year old male. EXAM: CHEST - 2 VIEW COMPARISON:  March 17, 2021. FINDINGS: LEFT-sided dual lead pacer device remains in place. EKG leads project over the chest. Cardiomediastinal contours and hilar structures are stable. Increasing density in the retrocardiac region on the LEFT. Small LEFT-sided pleural effusion associated with this finding. No signs of frank consolidation in the RIGHT chest. Perhaps subtle airspace disease at the RIGHT lung base since previous imaging, also in the RIGHT upper lobe as well. On limited assessment there is no acute skeletal process. IMPRESSION: Increasing density in the retrocardiac region on the LEFT. Small LEFT-sided pleural effusion associated with this finding. Findings suspicious for developing pneumonia. Subtle  airspace disease the RIGHT lung base and mild interstitial and alveolar opacification in the RIGHT upper chest suspicious for multifocal involvement. Electronically Signed   By: Zetta Bills M.D.   On: 03/20/2021 08:20   DG Chest Portable 1 View  Result Date: 03/17/2021 CLINICAL DATA:  Shortness of breath. EXAM: PORTABLE CHEST 1 VIEW COMPARISON:  03/03/2021 FINDINGS: Left-sided pacemaker unchanged. Lungs are somewhat hypoinflated without focal airspace consolidation or effusion. Borderline cardiomegaly unchanged. Remainder of the exam is unchanged. IMPRESSION: Hypoinflation without acute cardiopulmonary disease. Electronically Signed   By: Marin Olp M.D.   On: 03/17/2021 14:19    ECHOCARDIOGRAM COMPLETE  Result Date: 03/20/2021    ECHOCARDIOGRAM REPORT   Patient Name:   Nathan Alvarez Date of Exam: 03/20/2021 Medical Rec #:  [id number]      Height:       67.0 in Accession #:    [id number]     Weight:       118.9 lb Date of Birth:  October 05, 1924     BSA:          1.621 m Patient Age:    74 years       BP:           172/98 mmHg Patient Gender: M              HR:           65 bpm. Exam Location:  Inpatient Procedure: 2D Echo, Color Doppler and Cardiac Doppler Indications:    CHF  History:        Patient has no prior history of Echocardiogram examinations.                 Risk Factors:Hypertension and Diabetes.  Sonographer:    Jyl Heinz Referring Phys: Edgar  1. Left  ventricular ejection fraction, by estimation, is 35 to 40%. The left ventricle has moderately decreased function. The left ventricle demonstrates regional wall motion abnormalities (lateral hypokinesis). There is moderate concentric left ventricular hypertrophy. Left ventricular diastolic parameters are indeterminate.  2. Right ventricular systolic function is normal. The right ventricular size is normal.  3. Left atrial size was moderately dilated.  4. A small pericardial effusion is present. The pericardial  effusion is posterior to the left ventricle.  5. The mitral valve is degenerative. Moderate mitral valve regurgitation. No evidence of mitral stenosis.  6. The aortic valve is tricuspid. Aortic valve regurgitation is trivial. Aortic valve sclerosis is present, with no evidence of aortic valve stenosis. FINDINGS  Left Ventricle: Left ventricular ejection fraction, by estimation, is 35 to 40%. The left ventricle has moderately decreased function. The left ventricle demonstrates regional wall motion abnormalities. The left ventricular internal cavity size was normal in size. There is moderate concentric left ventricular hypertrophy. Left ventricular diastolic parameters are indeterminate.  LV Wall Scoring: The entire lateral wall is hypokinetic. Right Ventricle: The right ventricular size is normal. No increase in right ventricular wall thickness. Right ventricular systolic function is normal. Left Atrium: Left atrial size was moderately dilated. Right Atrium: Right atrial size was normal in size. Pericardium: A small pericardial effusion is present. The pericardial effusion is posterior to the left ventricle. Mitral Valve: The mitral valve is degenerative in appearance. Moderate mitral valve regurgitation. No evidence of mitral valve stenosis. Tricuspid Valve: The tricuspid valve is normal in structure. Tricuspid valve regurgitation is mild . No evidence of tricuspid stenosis. Aortic Valve: The aortic valve is tricuspid. There is mild to moderate aortic valve annular calcification. Aortic valve regurgitation is trivial. Aortic regurgitation PHT measures 540 msec. Aortic valve sclerosis is present, with no evidence of aortic valve stenosis. Aortic valve peak gradient measures 5.5 mmHg. Pulmonic Valve: The pulmonic valve was not well visualized. Pulmonic valve regurgitation is mild to moderate. No evidence of pulmonic stenosis. Aorta: The aortic root and ascending aorta are structurally normal, with no evidence of  dilitation. IAS/Shunts: The atrial septum is grossly normal.  LEFT VENTRICLE PLAX 2D LVIDd:         4.90 cm      Diastology LVIDs:         3.80 cm      LV e' medial:    4.13 cm/s LV PW:         1.30 cm      LV E/e' medial:  13.6 LV IVS:        1.40 cm      LV e' lateral:   9.46 cm/s LVOT diam:     1.80 cm      LV E/e' lateral: 5.9 LV SV:         36 LV SV Index:   22 LVOT Area:     2.54 cm  LV Volumes (MOD) LV vol d, MOD A2C: 98.0 ml LV vol d, MOD A4C: 113.0 ml LV vol s, MOD A2C: 65.1 ml LV vol s, MOD A4C: 76.8 ml LV SV MOD A2C:     32.9 ml LV SV MOD A4C:     113.0 ml LV SV MOD BP:      35.5 ml RIGHT VENTRICLE RV Basal diam:  3.70 cm RV Mid diam:    2.50 cm RV S prime:     14.90 cm/s TAPSE (M-mode): 2.0 cm LEFT ATRIUM  Index        RIGHT ATRIUM           Index LA diam:        4.50 cm 2.78 cm/m   RA Area:     17.60 cm LA Vol (A2C):   51.8 ml 31.95 ml/m  RA Volume:   46.40 ml  28.62 ml/m LA Vol (A4C):   69.4 ml 42.81 ml/m LA Biplane Vol: 64.9 ml 40.03 ml/m  AORTIC VALVE AV Area (Vmax): 1.54 cm AV Vmax:        117.00 cm/s AV Peak Grad:   5.5 mmHg LVOT Vmax:      71.00 cm/s LVOT Vmean:     45.900 cm/s LVOT VTI:       0.143 m AI PHT:         540 msec  AORTA Ao Root diam: 3.50 cm Ao Asc diam:  3.10 cm MITRAL VALVE               TRICUSPID VALVE MV Area (PHT): 3.48 cm    TR Peak grad:   39.9 mmHg MV Decel Time: 218 msec    TR Vmax:        316.00 cm/s MR Peak grad: 134.3 mmHg MR Mean grad: 92.5 mmHg    SHUNTS MR Vmax:      579.50 cm/s  Systemic VTI:  0.14 m MR Vmean:     465.5 cm/s   Systemic Diam: 1.80 cm MV E velocity: 56.20 cm/s MV A velocity: 57.20 cm/s MV E/A ratio:  0.98 Rudean Haskell MD Electronically signed by Rudean Haskell MD Signature Date/Time: 03/20/2021/7:55:43 PM    Final     DG ESOPHAGUS W SINGLE CM (SOL OR THIN BA)  Result Date: 03/21/2021 CLINICAL DATA:  Difficulty swallowing EXAM: ESOPHOGRAM/BARIUM SWALLOW TECHNIQUE: Single contrast examination was performed using   thin barium. FLUOROSCOPY TIME:  Fluoroscopy Time:  2 minutes 54 seconds Radiation Exposure Index (if provided by the fluoroscopic device): 12.0 mGy Number of Acquired Spot Images: 7 series, 346 images. This includes multiple cine clips. COMPARISON:  None. FINDINGS: There is severe esophageal dysmotility with delayed distal progression of contrast into the stomach. No visible ulceration. Small hiatal hernia. Gastroesophageal reflux occurred to the midesophagus. A 13 mm barium tablet became lodged in the distal esophagus just above the GE junction and did not pass within 10 minutes. Esophageal dysmotility took the pill to and fro from the mid to distal esophagus. IMPRESSION: Severe esophageal dysmotility. Small hiatal hernia. Gastroesophageal reflux occurred to the midesophagus. A 13 mm barium tablet stuck in the distal esophagus just above the GE junction and did not pass within 10 minutes. This is favored to be due to severe esophageal dysmotility, however a distal esophageal stricture cannot be excluded. Electronically Signed   By: Maurine Simmering M.D.   On: 03/21/2021 10:09      Subjective: Seen this morning.  Patient reports that he feels great.  Denies dyspnea, cough or chest pain.  Denies dysphagia either.  No other complaints reported.  Discharge Exam:  Vitals:   03/22/21 0600 03/22/21 0630 03/22/21 0631 03/22/21 1104  BP:  (!) 176/98 (!) 175/83   Pulse:      Resp: 17 (!) 21 20 20   Temp:    97.9 F (36.6 C)  TempSrc:    Oral  SpO2:      Weight:      Height:        General exam: Elderly male, well-built, poorly nourished  and chronically ill looking lying comfortably supine in bed without distress.  Looks much improved compared to 2 days ago. Respiratory system: Clear to auscultation except occasional basal crackles.  No increased work of breathing.  Left upper anterior chest permanent pacemaker.   Cardiovascular system: S1 and S2 heard, RRR.  No JVD, murmurs or pedal edema.  Telemetry  personally reviewed: Sinus rhythm with intermittent V paced rhythm. Gastrointestinal system: Abdomen is nondistended, soft and nontender. No organomegaly or masses felt. Normal bowel sounds heard. Central nervous system: Alert and oriented x2. No focal neurological deficits. Extremities: Symmetric 5 x 5 power. Skin: No rashes, lesions or ulcers Psychiatry: Judgement and insight appear somewhat impaired.. Mood & affect appropriate.    The results of significant diagnostics from this hospitalization (including imaging, microbiology, ancillary and laboratory) are listed below for reference.     Microbiology: Recent Results (from the past 240 hour(s))  Culture, blood (Routine x 2)     Status: None (Preliminary result)   Collection Time: 03/17/21  2:20 PM   Specimen: BLOOD  Result Value Ref Range Status   Specimen Description   Final    BLOOD RIGHT ANTECUBITAL Performed at [facility], [address] [place], [place] [address]    Special Requests   Final    BOTTLES DRAWN AEROBIC AND ANAEROBIC Blood Culture adequate volume Performed at [facility], [address] [place], [place] [address]    Culture   Final    NO GROWTH 4 DAYS Performed at [facility], [place] [address], [place],  [address]    Report Status PENDING  Incomplete  Resp Panel by RT-PCR (Flu A&B, Covid) Nasopharyngeal Swab     Status: Abnormal   Collection Time: 03/17/21  3:31 PM   Specimen: Nasopharyngeal Swab; Nasopharyngeal(NP) swabs in vial transport medium  Result Value Ref Range Status   SARS Coronavirus 2 by RT PCR NEGATIVE NEGATIVE Final    Comment: (NOTE) SARS-CoV-2 target nucleic acids are NOT DETECTED.  The SARS-CoV-2 RNA is generally detectable in upper respiratory specimens during the acute phase of infection. The lowest concentration of SARS-CoV-2 viral copies this assay can detect is 138 copies/mL. A negative result does not preclude  SARS-Cov-2 infection and should not be used as the sole basis for treatment or other patient management decisions. A negative result may occur with  improper specimen collection/handling, submission of specimen other than nasopharyngeal swab, presence of viral mutation(s) within the areas targeted by this assay, and inadequate number of viral copies(<138 copies/mL). A negative result must be combined with clinical observations, patient history, and epidemiological information. The expected result is Negative.  Fact Sheet for Patients:  EntrepreneurPulse.com.au  Fact Sheet for Healthcare Providers:  IncredibleEmployment.be  This test is no t yet approved or cleared by the [place] FDA and  has been authorized for detection and/or diagnosis of SARS-CoV-2 by FDA under an Emergency Use Authorization (EUA). This EUA will remain  in effect (meaning this test can be used) for the duration of the COVID-19 declaration under Section 564(b)(1) of the Act, 21 U.S.C.section 360bbb-3(b)(1), unless the authorization is terminated  or revoked sooner.       Influenza A by PCR POSITIVE (A) NEGATIVE Final   Influenza B by PCR NEGATIVE NEGATIVE Final    Comment: (NOTE) The Xpert Xpress SARS-CoV-2/FLU/RSV plus assay is intended as an aid in the diagnosis of influenza from Nasopharyngeal swab specimens and should not be used as a sole basis for treatment. Nasal  washings and aspirates are unacceptable for Xpert Xpress SARS-CoV-2/FLU/RSV testing.  Fact Sheet for Patients: BloggerCourse.com  Fact Sheet for Healthcare Providers: SeriousBroker.it  This test is not yet approved or cleared by the [place] FDA and has been authorized for detection and/or diagnosis of SARS-CoV-2 by FDA under an Emergency Use Authorization (EUA). This EUA will remain in effect (meaning this test can be used) for the duration of  the COVID-19 declaration under Section 564(b)(1) of the Act, 21 U.S.C. section 360bbb-3(b)(1), unless the authorization is terminated or revoked.  Performed at [facility], [address] [place], [place] [address]   Culture, blood (Routine x 2)     Status: None   Collection Time: 03/17/21  4:23 PM   Specimen: BLOOD LEFT FOREARM  Result Value Ref Range Status   Specimen Description   Final    BLOOD LEFT FOREARM Performed at [facility], [address] [place], [place] [address]    Special Requests   Final    BOTTLES DRAWN AEROBIC AND ANAEROBIC Blood Culture adequate volume Performed at [facility], [address], [place], [place] [address]    Culture   Final    NO GROWTH 5 DAYS Performed at [facility], [address], [place], [place] [address]    Report Status 03/22/2021 FINAL  Final  MRSA Next Gen by PCR, Nasal     Status: None   Collection Time: 03/17/21  9:38 PM   Specimen: Nasal Mucosa; Nasal Swab  Result Value Ref Range Status   MRSA by PCR Next Gen NOT DETECTED NOT DETECTED Final    Comment: (NOTE) The GeneXpert MRSA Assay (FDA approved for NASAL specimens only), is one component of a comprehensive MRSA colonization surveillance program. It is not intended to diagnose MRSA infection nor to guide or monitor treatment for MRSA infections. Test performance is not FDA approved in patients less than 66 years old. Performed at [facility], [address], [place], [place] [address]      Labs: CBC: Recent Labs  Lab 03/17/21 1420 03/17/21 2340 03/19/21 0221  WBC 14.0* 9.2 8.1  NEUTROABS 12.9*  --   --   HGB 12.8* 11.2* 11.8*  HCT 39.8 34.9* 36.3*  MCV 96.1 96.4 94.0  PLT 203 179 158    Basic Metabolic Panel: Recent Labs  Lab 03/17/21 2340 03/19/21 0221 03/20/21 0445 03/21/21 0216 03/22/21 0149  NA 132* 135 134* 135 136  K 3.6 3.1* 3.8 3.4* 3.0*  CL 104 105 105 102 102  CO2 22 20* 20*  24 25  GLUCOSE 183* 131* 190* 153* 75  BUN 26* 21 21 19 17   CREATININE 0.90 0.84 0.69 0.89 0.80  CALCIUM 7.7* 8.3* 7.9* 8.1* 8.2*  MG 1.9  --   --   --  1.8  PHOS 3.7  --   --   --   --     Liver Function Tests: Recent Labs  Lab 03/17/21 1420  AST 13*  ALT 8  ALKPHOS 75  BILITOT 0.5  PROT 6.8  ALBUMIN 3.7    CBG: Recent Labs  Lab 03/21/21 1124 03/21/21 1648 03/21/21 2225 03/22/21 0627 03/22/21 1103  GLUCAP 72 96 104* 95 106*     Urinalysis    Component Value Date/Time   COLORURINE YELLOW 03/17/2021 1630   APPEARANCEUR CLEAR 03/17/2021 1630   LABSPEC 1.024 03/17/2021 1630   PHURINE 5.5 03/17/2021 1630   GLUCOSEU NEGATIVE 03/17/2021 1630  HGBUR NEGATIVE 03/17/2021 1630   BILIRUBINUR NEGATIVE 03/17/2021 [place] 03/17/2021 1630   PROTEINUR 30 (A) 03/17/2021 1630   NITRITE NEGATIVE 03/17/2021 1630   LEUKOCYTESUR NEGATIVE 03/17/2021 1630    I discussed in detail with patient's daughter-in-law via phone, updated care and answered all questions.  Time coordinating discharge: 35 minutes  SIGNED:  Vernell Leep, MD,  FACP, [facility], [facility], [facility] (Care Management Physician Certified). [facility]  To contact the attending provider between 7A-7P or the covering provider during after hours 7P-7A, please log into the web site [email or web address] and access using universal [place] password for that web site. If you do not have the password, please call the hospital operator.

## 2021-03-22 NOTE — Progress Notes (Signed)
Patient discharged: [facility] with family.  Left via: Wheelchair  Discharge paperwork reviewed and given: to family. Teach back completed. Telemetry disconnected. Belongings given to patient.

## 2021-03-22 NOTE — TOC Transition Note (Addendum)
Transition of Care [facility]) - CM/SW Discharge Note   Patient Details  Name: Nathan Alvarez MRN: [id number] Date of Birth: Nov 14, 1924  Transition of Care [facility]) CM/SW Contact:  Lynett Grimes Phone Number: 03/22/2021, 1:30 PM   Clinical Narrative:    Patient will DC to: [facility] Anticipated DC date: 12/16/022 Family notified: Pt daughter in Development worker, community by: Pt daughter in law   Per MD patient ready for DC to [facility]. RN to call report prior to discharge [phone]). RN, patient, patient's family, and facility notified of DC. Discharge Summary and FL2 sent to facility. DC packet on chart. Family will transport back to [facility].    CSW will sign off for now as social work intervention is no longer needed. Please consult [place] again if new needs arise.     Final next level of care: Assisted Living Barriers to Discharge: Continued Medical Work up   Patient Goals and CMS Choice Patient states their goals for this hospitalization and ongoing recovery are:: Return to ALF to complete PT CMS Medicare.gov Compare Post Acute Care list provided to:: Patient Choice offered to / list presented to : Patient  Discharge Placement                       Discharge Plan and Services In-house Referral: Clinical Social Work                                   Social Determinants of Health (SDOH) Interventions     Readmission Risk Interventions No flowsheet data found.

## 2021-03-22 NOTE — Progress Notes (Signed)
SLP Cancellation Note  Patient Details Name: Nathan Alvarez MRN: [id number] DOB: 24-Mar-1925   Cancelled treatment:       Reason Eval/Treat Not Completed: Patient declined, no reason specified. Pt politely declines any POs offered at this time, not wanting to eat or drink but also saying that he prefers the chopped diet that he was changed to yesterday. Will leave current diet orders in place and attempt to f/u acutely.     Mahala Menghini., M.A. CCC-SLP Acute Rehabilitation Services Pager [phone] Office [phone]  03/22/2021, 11:14 AM

## 2021-03-22 NOTE — Discharge Instructions (Signed)

## 2021-03-22 NOTE — Progress Notes (Signed)
Physical Therapy Treatment Patient Details Name: Nathan Alvarez MRN: [id number] DOB: 10/10/1924 Today's Date: 03/22/2021   History of Present Illness pt is a 87 y/o male presenting 12/11 with complaint of hypoxia due to infulenza pneumonia, hypotensive from sepsis and hypovolemia.  PMHx, afib, DM2, GERD, HB, sick sinus syn s/p pacemaker, HTN, HLD    PT Comments    Pt is making good progress with mobility, only need minA for bed mobility and transfers when using a RW at this time. Pt also progressed to ambulating up to ~92 ft with a RW and min guard-minA today. He continues to be at risk for falls and needs physical assistance for all mobility for safety at this time. Per chart, his ALF can provide the level of care needed. Thus, updated d/c recs to HHPT at his ALF. Will continue to follow acutely.   Recommendations for follow up therapy are one component of a multi-disciplinary discharge planning process, led by the attending physician.  Recommendations may be updated based on patient status, additional functional criteria and insurance authorization.  Follow Up Recommendations  Home health PT (at his ALF)     Assistance Recommended at Discharge Intermittent Supervision/Assistance  Equipment Recommendations  Other (comment) (TBA)    Recommendations for Other Services       Precautions / Restrictions Precautions Precautions: Fall Precaution Comments: HOH Restrictions Weight Bearing Restrictions: No     Mobility  Bed Mobility Overal bed mobility: Needs Assistance Bed Mobility: Supine to Sit     Supine to sit: Min assist;HOB elevated     General bed mobility comments: Good initiation by pt to exit L EOB with HOB elevated using bed rail. Pt requesting PT's hand to pull on to ascend trunk, minA.    Transfers Overall transfer level: Needs assistance Equipment used: Rolling walker (2 wheels) Transfers: Sit to/from Stand Sit to Stand: Min assist           General  transfer comment: Cues to push up from siting surface, extra time and multiple attempts required at times, pt initiating scoot to edge when unsuccessful initially, minA to power up and steady to RW. x1 rep from EOB and x5 reps from recliner    Ambulation/Gait Ambulation/Gait assistance: Min assist;Min guard Gait Distance (Feet): 92 Feet Assistive device: Rolling walker (2 wheels) Gait Pattern/deviations: Step-through pattern;Decreased stride length;Shuffle;Trunk flexed Gait velocity: reduced Gait velocity interpretation: <1.31 ft/sec, indicative of household ambulator   General Gait Details: Pt with kyphotic posture and inferior gaze, cuing to improve upright posture as able throughout. Pt with slow, shuffling gait, fatiguing quickly, min guard-light minA to steady.   Stairs             Wheelchair Mobility    Modified Rankin (Stroke Patients Only)       Balance Overall balance assessment: Needs assistance Sitting-balance support: Feet supported;Single extremity supported;Bilateral upper extremity supported Sitting balance-Leahy Scale: Poor Sitting balance - Comments: reliant on at least 1 UE support.   Standing balance support: During functional activity;Bilateral upper extremity supported Standing balance-Leahy Scale: Poor Standing balance comment: reliant on external support                            Cognition Arousal/Alertness: Awake/alert Behavior During Therapy: WFL for tasks assessed/performed Overall Cognitive Status: Difficult to assess  General Comments: Pt HOH, normally wears hearing aids per daughter. Per daughter, she has to direct him with transfers often. Follows directions appropriately with increased time.        Exercises Other Exercises Other Exercises: sit <> stands x5 in a row from recliner using UEs, minA    General Comments        Pertinent Vitals/Pain Pain Assessment:  Faces Faces Pain Scale: Hurts a little bit Pain Location: generalized grimacing Pain Descriptors / Indicators: Grimacing Pain Intervention(s): Limited activity within patient's tolerance;Monitored during session;Repositioned    Home Living                          Prior Function            PT Goals (current goals can now be found in the care plan section) Acute Rehab PT Goals Patient Stated Goal: to improve PT Goal Formulation: With patient Time For Goal Achievement: 04/01/21 Potential to Achieve Goals: Good Progress towards PT goals: Progressing toward goals    Frequency    Min 3X/week      PT Plan Discharge plan needs to be updated    Co-evaluation              AM-PAC PT "6 Clicks" Mobility   Outcome Measure  Help needed turning from your back to your side while in a flat bed without using bedrails?: A Little Help needed moving from lying on your back to sitting on the side of a flat bed without using bedrails?: A Little Help needed moving to and from a bed to a chair (including a wheelchair)?: A Little Help needed standing up from a chair using your arms (e.g., wheelchair or bedside chair)?: A Little Help needed to walk in hospital room?: A Little Help needed climbing 3-5 steps with a railing? : Total 6 Click Score: 16    End of Session Equipment Utilized During Treatment: Gait belt Activity Tolerance: Patient tolerated treatment well Patient left: with call bell/phone within reach;in chair;with chair alarm set   PT Visit Diagnosis: Other abnormalities of gait and mobility (R26.89);Muscle weakness (generalized) (M62.81);Difficulty in walking, not elsewhere classified (R26.2);Unsteadiness on feet (R26.81)     Time: 1478-2956 PT Time Calculation (min) (ACUTE ONLY): 13 min  Charges:  $Gait Training: 8-22 mins                     Nathan Alvarez, PT, DPT Acute Rehabilitation Services  Pager: [phone] Office: [phone]    Jewel Baize 03/22/2021, 1:01 PM

## 2021-03-23 LAB — CULTURE, BLOOD (ROUTINE X 2)
Culture: NO GROWTH
Special Requests: ADEQUATE

## 2021-03-25 NOTE — Telephone Encounter (Signed)
° °  Pt's daughter is calling back to r/s procedure

## 2021-03-26 NOTE — Telephone Encounter (Signed)
Call back received from daughter.  Pt rescheduled for gen change on 04/09/2021-arrival time 11:30 am  Labs up to date  Pt has soap  Reiterated instructions: Nothing to eat or drink in AM All meds ok except Januvia Use soap as directed  Work up complete

## 2021-03-26 NOTE — Telephone Encounter (Signed)
Left message requesting  call back.

## 2021-03-27 ENCOUNTER — Encounter: Payer: Medicare Other | Admitting: Internal Medicine

## 2021-04-02 ENCOUNTER — Telehealth: Payer: Self-pay | Admitting: Internal Medicine

## 2021-04-02 NOTE — Telephone Encounter (Signed)
Daughter of the patient called. The family can not bring the patient to his procedure 04/09/21. They are both recovering from the Flu. The patient's Daughter would like to get the procedure re-scheduled

## 2021-04-02 NOTE — Telephone Encounter (Signed)
Pt rescheduled for January 9.

## 2021-04-12 NOTE — Pre-Procedure Instructions (Signed)
Attempted to call patient regarding procedure instructions.  No answer 

## 2021-04-14 NOTE — Progress Notes (Deleted)
Cardiology Office Note:    Date:  04/14/2021   ID:  Aundra Millet, DOB 1924/05/06, MRN [id number]  PCP:  Vivien Presto, MD  Cardiologist:  Lewayne Bunting, MD  Electrophysiologist:  Lewayne Bunting, MD   Referring MD: Vivien Presto, MD   No chief complaint on file. ***  History of Present Illness:    Nathan Alvarez is a 88 y.o. male with a hx of heart failure with reduced ejection fraction, atrial fibrillation, sick sinus syndrome status post PPM, hypertension, hyperlipidemia who presents for hospital follow-up.  He was admitted in December 2022 with influenza.  Found to have new systolic heart failure, EF 35 to 40%.  He declined any invasive evaluation for his heart failure.  Past Medical History:  Diagnosis Date   A-fib (HCC)    Dizziness    DM (diabetes mellitus) (HCC)    Esophagitis, reflux    Heart block    Hyperlipidemia    Hypertension, essential, benign    Memory deficit    Pathological fracture of left hip due to age-related osteoporosis (HCC) 04/25/2019   SOB (shortness of breath)    SSS (sick sinus syndrome) (HCC)     Past Surgical History:  Procedure Laterality Date   CATARACT EXTRACTION Right    HIP ARTHROPLASTY Left 04/22/2019   Procedure: ARTHROPLASTY BIPOLAR HIP (HEMIARTHROPLASTY);  Surgeon: Myrene Galas, MD;  Location: [facility] OR;  Service: Orthopedics;  Laterality: Left;   INGUINAL HERNIA REPAIR     INSERT / REPLACE / REMOVE PACEMAKER     PACEMAKER INSERTION     TONSILLECTOMY AND ADENOIDECTOMY      Current Medications: No outpatient medications have been marked as taking for the 04/19/21 encounter (Appointment) with Little Ishikawa, MD.     Allergies:   Penicillins   Social History   Socioeconomic History   Marital status: Married    Spouse name: Not on file   Number of children: Not on file   Years of education: Not on file   Highest education level: Not on file  Occupational History   Not on file  Tobacco Use   Smoking status:  Former    Types: Cigarettes    Quit date: 01/01/1956    Years since quitting: 65.3    Passive exposure: Never   Smokeless tobacco: Never  Vaping Use   Vaping Use: Never used  Substance and Sexual Activity   Alcohol use: No   Drug use: No   Sexual activity: Not Currently    Comment: MARRIED  Other Topics Concern   Not on file  Social History Narrative   Not on file   Social Determinants of Health   Financial Resource Strain: Not on file  Food Insecurity: Not on file  Transportation Needs: Not on file  Physical Activity: Not on file  Stress: Not on file  Social Connections: Not on file     Family History: The patient's ***family history includes Heart disease in his mother.  ROS:   Please see the history of present illness.    *** All other systems reviewed and are negative.  EKGs/Labs/Other Studies Reviewed:    The following studies were reviewed today: ***  EKG:  EKG is *** ordered today.  The ekg ordered today demonstrates ***  Recent Labs: 03/03/2021: B Natriuretic Peptide 2,080.6 03/17/2021: ALT 8 03/19/2021: Hemoglobin 11.8; Platelets 158 03/22/2021: BUN 17; Creatinine, Ser 0.80; Magnesium 1.8; Potassium 3.0; Sodium 136  Recent Lipid Panel No results found for: CHOL, TRIG, HDL, CHOLHDL,  VLDL, LDLCALC, LDLDIRECT  Physical Exam:    VS:  There were no vitals taken for this visit.    Wt Readings from Last 3 Encounters:  03/22/21 109 lb 9.6 oz (49.7 kg)  03/14/21 117 lb (53.1 kg)  01/23/21 133 lb 6.1 oz (60.5 kg)     GEN: *** Well nourished, well developed in no acute distress HEENT: Normal NECK: No JVD; No carotid bruits LYMPHATICS: No lymphadenopathy CARDIAC: ***RRR, no murmurs, rubs, gallops RESPIRATORY:  Clear to auscultation without rales, wheezing or rhonchi  ABDOMEN: Soft, non-tender, non-distended MUSCULOSKELETAL:  No edema; No deformity  SKIN: Warm and dry NEUROLOGIC:  Alert and oriented x 3 PSYCHIATRIC:  Normal affect   ASSESSMENT:     No diagnosis found. PLAN:    Chronic combined systolic and diastolic heart failure: admitted in December 2022 with influenza.  Found to have new systolic heart failure, EF 35 to 40%.  He declined any invasive evaluation for his heart failure. -Continue losartan 25 mg daily -Continue carvedilol 6.25 mg twice daily  Sick sinus syndrome: Status post Medtronic dual chamber PPM.  Status post generator change on***  RTC in***    Medication Adjustments/Labs and Tests Ordered: Current medicines are reviewed at length with the patient today.  Concerns regarding medicines are outlined above.  No orders of the defined types were placed in this encounter.  No orders of the defined types were placed in this encounter.   There are no Patient Instructions on file for this visit.   Signed, Little Ishikawa, MD  04/14/2021 9:25 PM    [facility]

## 2021-04-15 ENCOUNTER — Ambulatory Visit (HOSPITAL_COMMUNITY)
Admission: RE | Admit: 2021-04-15 | Discharge: 2021-04-15 | Disposition: A | Discharge status: Home / Self Care | Payer: Medicare Other | Attending: Internal Medicine | Admitting: Internal Medicine

## 2021-04-15 ENCOUNTER — Encounter (HOSPITAL_COMMUNITY)
Admission: RE | Disposition: A | Discharge status: Home / Self Care | Payer: Self-pay | Source: Home / Self Care | Attending: Internal Medicine

## 2021-04-15 DIAGNOSIS — Z45010 Encounter for checking and testing of cardiac pacemaker pulse generator [battery]: Secondary | ICD-10-CM

## 2021-04-15 DIAGNOSIS — I10 Essential (primary) hypertension: Secondary | ICD-10-CM | POA: Insufficient documentation

## 2021-04-15 DIAGNOSIS — I495 Sick sinus syndrome: Secondary | ICD-10-CM | POA: Diagnosis not present

## 2021-04-15 DIAGNOSIS — E119 Type 2 diabetes mellitus without complications: Secondary | ICD-10-CM | POA: Diagnosis not present

## 2021-04-15 HISTORY — PX: PPM GENERATOR CHANGEOUT: EP1233

## 2021-04-15 LAB — GLUCOSE, CAPILLARY: Glucose-Capillary: 111 mg/dL — ABNORMAL HIGH (ref 70–99)

## 2021-04-15 LAB — BASIC METABOLIC PANEL
Anion gap: 8 (ref 5–15)
BUN: 19 mg/dL (ref 8–23)
CO2: 25 mmol/L (ref 22–32)
Calcium: 8.4 mg/dL — ABNORMAL LOW (ref 8.9–10.3)
Chloride: 101 mmol/L (ref 98–111)
Creatinine, Ser: 0.86 mg/dL (ref 0.61–1.24)
GFR, Estimated: >60 mL/min (ref >60)
Glucose, Bld: 108 mg/dL — ABNORMAL HIGH (ref 70–99)
Potassium: 4.4 mmol/L (ref 3.5–5.1)
Sodium: 134 mmol/L — ABNORMAL LOW (ref 135–145)

## 2021-04-15 SURGERY — PPM GENERATOR CHANGEOUT

## 2021-04-15 MED ORDER — SODIUM CHLORIDE 0.9 % IV SOLN
INTRAVENOUS | Status: AC
  Filled 2021-04-15: qty 2

## 2021-04-15 MED ORDER — SODIUM CHLORIDE 0.9 % IV SOLN
80.0000 mg | INTRAVENOUS | Status: AC
  Administered 2021-04-15: 80 mg

## 2021-04-15 MED ORDER — ONDANSETRON HCL 4 MG/2ML IJ SOLN
INTRAMUSCULAR | Status: AC
  Filled 2021-04-15: qty 2

## 2021-04-15 MED ORDER — SODIUM CHLORIDE 0.9 % IV SOLN: INTRAVENOUS | Status: DC

## 2021-04-15 MED ORDER — VANCOMYCIN HCL IN DEXTROSE 1-5 GM/200ML-% IV SOLN
1000.0000 mg | INTRAVENOUS | Status: AC
  Administered 2021-04-15: 1000 mg via INTRAVENOUS

## 2021-04-15 MED ORDER — ONDANSETRON HCL 4 MG/2ML IJ SOLN
INTRAMUSCULAR | Status: DC | PRN
  Administered 2021-04-15: 2 mg via INTRAVENOUS

## 2021-04-15 MED ORDER — LIDOCAINE HCL (PF) 1 % IJ SOLN
INTRAMUSCULAR | Status: DC | PRN
  Administered 2021-04-15: 55 mL

## 2021-04-15 MED ORDER — VANCOMYCIN HCL IN DEXTROSE 1-5 GM/200ML-% IV SOLN
INTRAVENOUS | Status: AC
  Filled 2021-04-15: qty 200

## 2021-04-15 MED ORDER — POVIDONE-IODINE 10 % EX SWAB
2.0000 "application " | Freq: Once | CUTANEOUS | Status: AC
  Administered 2021-04-15: 2 via TOPICAL

## 2021-04-15 MED ORDER — ONDANSETRON HCL 4 MG/2ML IJ SOLN: 4.0000 mg | Freq: Four times a day (QID) | INTRAMUSCULAR | Status: DC | PRN

## 2021-04-15 MED ORDER — LIDOCAINE HCL (PF) 1 % IJ SOLN
INTRAMUSCULAR | Status: AC
  Filled 2021-04-15: qty 30

## 2021-04-15 MED ORDER — CHLORHEXIDINE GLUCONATE 4 % EX LIQD
4.0000 "application " | Freq: Once | CUTANEOUS | Status: DC
  Filled 2021-04-15: qty 60

## 2021-04-15 MED ORDER — ACETAMINOPHEN 325 MG PO TABS
325.0000 mg | ORAL_TABLET | ORAL | Status: DC | PRN
  Filled 2021-04-15: qty 2

## 2021-04-15 SURGICAL SUPPLY — 5 items
CABLE SURGICAL S-101-97-12 (CABLE) ×2 IMPLANT
IPG PACE AZUR XT DR MRI W1DR01 (Pacemaker) IMPLANT
PACE AZURE XT DR MRI W1DR01 (Pacemaker) ×2 IMPLANT
PAD DEFIB RADIO PHYSIO CONN (PAD) ×2 IMPLANT
TRAY PACEMAKER INSERTION (PACKS) ×2 IMPLANT

## 2021-04-15 NOTE — Discharge Instructions (Signed)

## 2021-04-15 NOTE — H&P (Signed)
Electrophysiology Office Note Date: 03/14/2021   ID:  Nathan Alvarez, DOB 06-23-1924, MRN [id number]   PCP: Curly Rim, MD Primary Cardiologist: Cristopher Peru, MD Electrophysiologist: Cristopher Peru, MD    CC: Pacemaker follow-up   Nathan Alvarez is a 88 y.o. male seen today for Cristopher Peru, MD for routine electrophysiology followup.  Since last being seen in our clinic the patient reports doing well. He had misplaced his monitor and we were only recently notified that ERI occurred 08/2020. Device NOT checked today with limited battery per MDT recommendation, previously, leads were stable.  he denies chest pain, palpitations, dyspnea, PND, orthopnea, nausea, vomiting, dizziness, syncope, edema, weight gain, or early satiety.   Device History: Medtronic Dual Chamber PPM implanted 2011 for SNF       Past Medical History:  Diagnosis Date   A-fib ([place])     Dizziness     DM (diabetes mellitus) ([place])     Esophagitis, reflux     Heart block     Hyperlipidemia     Hypertension, essential, benign     Memory deficit     Pathological fracture of left hip due to age-related osteoporosis ([place]) 04/25/2019   SOB (shortness of breath)     SSS (sick sinus syndrome) ([place])           Past Surgical History:  Procedure Laterality Date   CATARACT EXTRACTION Right     HIP ARTHROPLASTY Left 04/22/2019    Procedure: ARTHROPLASTY BIPOLAR HIP (HEMIARTHROPLASTY);  Surgeon: Altamese Farwell, MD;  Location: [facility];  Service: Orthopedics;  Laterality: Left;   INGUINAL HERNIA REPAIR       INSERT / REPLACE / REMOVE PACEMAKER       PACEMAKER INSERTION       TONSILLECTOMY AND ADENOIDECTOMY                Current Outpatient Medications  Medication Sig Dispense Refill   acetaminophen (TYLENOL) 325 MG tablet Take 2 tablets (650 mg total) by mouth every 6 (six) hours as needed for mild pain (or Fever >/= 101). 12 tablet 1   aspirin EC 81 MG EC tablet Take 1 tablet (81 mg total) by mouth daily with  breakfast. Swallow whole. 30 tablet 11   calcium carbonate (OS-CAL - DOSED IN MG OF ELEMENTAL CALCIUM) 1250 (500 Ca) MG tablet Take 1 tablet (500 mg of elemental calcium total) by mouth 2 (two) times daily with a meal. 60 tablet 1   carvedilol (COREG) 6.25 MG tablet Take 1 tablet (6.25 mg total) by mouth 2 (two) times daily. For BP and Heart 180 tablet 3   Ferrous Sulfate (IRON) 325 (65 Fe) MG TABS Take 28 mg by mouth daily.       JANUVIA 50 MG tablet Take 1 tablet (50 mg total) by mouth daily. 30 tablet 1   PARoxetine (PAXIL) 10 MG tablet Take by mouth daily.       furosemide (LASIX) 20 MG tablet Take 0.5 tablets (10 mg total) by mouth daily for 4 days. 2 tablet 0   mirtazapine (REMERON) 7.5 MG tablet Take 1 tablet (7.5 mg total) by mouth at bedtime. For appetite stimulation and mood (Patient not taking: Reported on 03/14/2021) 30 tablet 4    No current facility-administered medications for this visit.      Allergies:   Penicillins    Social History: Social History         Socioeconomic History   Marital status: Married  Spouse name: Not on file   Number of children: Not on file   Years of education: Not on file   Highest education level: Not on file  Occupational History   Not on file  Tobacco Use   Smoking status: Former      Types: Cigarettes      Quit date: 01/01/1956      Years since quitting: 65.2      Passive exposure: Never   Smokeless tobacco: Never  Vaping Use   Vaping Use: Never used  Substance and Sexual Activity   Alcohol use: No   Drug use: No   Sexual activity: Not Currently      Comment: MARRIED  Other Topics Concern   Not on file  Social History Narrative   Not on file    Social Determinants of Health    Financial Resource Strain: Not on file  Food Insecurity: Not on file  Transportation Needs: Not on file  Physical Activity: Not on file  Stress: Not on file  Social Connections: Not on file  Intimate Partner Violence: Not on file      Family  History:      Family History  Problem Relation Age of Onset   Heart disease Mother          Review of Systems: All other systems reviewed and are otherwise negative except as noted above.   Physical Exam:    Vitals:    03/14/21 1135  BP: 120/80  Pulse: 65  SpO2: 96%  Weight: 117 lb (53.1 kg)  Height: 5\' 7"  (1.702 m)      GEN- The patient is elderly appearing, alert and oriented x 3 today.   HEENT: normocephalic, atraumatic; sclera clear, conjunctiva pink; hearing intact; oropharynx clear; neck supple  Lungs- Clear to ausculation bilaterally, normal work of breathing.  No wheezes, rales, rhonchi Heart- Regular rate and rhythm, no murmurs, rubs or gallops  GI- soft, non-tender, non-distended, bowel sounds present  Extremities- no clubbing or cyanosis. No edema MS- no significant deformity or atrophy Skin- warm and dry, no rash or lesion; PPM pocket well healed Psych- euthymic mood, full affect Neuro- strength and sensation are intact   PPM Interrogation- reviewed in detail today,  See PACEART report   EKG:  EKG is not ordered today. EKG 03/05/2021 reviewed which shows V paced rhythm at 66 bpm   Recent Labs: 01/21/2021: ALT 9 01/23/2021: Magnesium 2.1 03/03/2021: B Natriuretic Peptide 2,080.6; BUN 22; Creatinine, Ser 0.75; Hemoglobin 11.9; Platelets 251; Potassium 3.9; Sodium 138       Wt Readings from Last 3 Encounters:  03/14/21 117 lb (53.1 kg)  01/23/21 133 lb 6.1 oz (60.5 kg)  12/14/20 143 lb (64.9 kg)      Other studies Reviewed: Additional studies/ records that were reviewed today include: Previous EP office notes, Previous remote checks, Most recent labwork.    Assessment and Plan:   1. SND s/p Medtronic PPM  Device at RRT as of 08/2020 Not dependent chronically Device NOT tested today with only occasional pacing and device at EOS. Do not want to stress battery further.  Scheduled for gen change next week with Dr. Lovena Le.  Explained risks, benefits, and  alternatives to PPM gen change, including but not limited to bleeding or infection.  Pt and daughter verbalized understanding and agrees to proceed   2. HTN Stable on current regimen    Current medicines are reviewed at length with the patient today.     Labs/  tests ordered today include:     Orders Placed This Encounter  Procedures   Basic metabolic panel   CBC       Disposition:   Follow up as usual post gen change.     Jacalyn Lefevre, PA-C  03/14/2021 11:44 AM  EP Attending  Patient seen and examined. Agree with above. The patient presents for PPM gen change out. He is at [facility]. He will undergo PPM gen change out. I have reviewed the indications/risks/benefits/goals/expectations and he wishes to proceed.  Carleene Overlie Rayaan Garguilo,MD

## 2021-04-16 ENCOUNTER — Encounter (HOSPITAL_COMMUNITY): Payer: Self-pay | Admitting: Internal Medicine

## 2021-04-19 ENCOUNTER — Ambulatory Visit: Payer: Medicare Other | Admitting: Cardiology

## 2021-04-25 ENCOUNTER — Ambulatory Visit (INDEPENDENT_AMBULATORY_CARE_PROVIDER_SITE_OTHER): Payer: Medicare Other

## 2021-04-25 ENCOUNTER — Other Ambulatory Visit: Payer: Self-pay

## 2021-04-25 ENCOUNTER — Encounter (INDEPENDENT_AMBULATORY_CARE_PROVIDER_SITE_OTHER): Payer: Self-pay

## 2021-04-25 DIAGNOSIS — I495 Sick sinus syndrome: Secondary | ICD-10-CM

## 2021-04-25 LAB — CUP PACEART INCLINIC DEVICE CHECK
Battery Remaining Longevity: 165 mo
Battery Voltage: 3.22 V
Brady Statistic AP VP Percent: 0.13 %
Brady Statistic AP VS Percent: 90.67 %
Brady Statistic AS VP Percent: 0.01 %
Brady Statistic AS VS Percent: 9.2 %
Brady Statistic RA Percent Paced: 85.7 %
Brady Statistic RV Percent Paced: 0.92 %
Date Time Interrogation Session: 20230119103700
Implantable Lead Implant Date: 20110513
Implantable Lead Implant Date: 20110513
Implantable Lead Location: 753859
Implantable Lead Location: 753860
Implantable Lead Model: 5092
Implantable Lead Model: 5594
Implantable Pulse Generator Implant Date: 20230109
Lead Channel Impedance Value: 361 Ohm
Lead Channel Impedance Value: 361 Ohm
Lead Channel Impedance Value: 399 Ohm
Lead Channel Impedance Value: 456 Ohm
Lead Channel Pacing Threshold Amplitude: 0.5 V
Lead Channel Pacing Threshold Amplitude: 0.75 V
Lead Channel Pacing Threshold Pulse Width: 0.4 ms
Lead Channel Pacing Threshold Pulse Width: 0.4 ms
Lead Channel Sensing Intrinsic Amplitude: 1.875 mV
Lead Channel Sensing Intrinsic Amplitude: 2.5 mV
Lead Channel Sensing Intrinsic Amplitude: 7.875 mV
Lead Channel Sensing Intrinsic Amplitude: 9.625 mV
Lead Channel Setting Pacing Amplitude: 1.5 V
Lead Channel Setting Pacing Amplitude: 2 V
Lead Channel Setting Pacing Pulse Width: 0.4 ms
Lead Channel Setting Sensing Sensitivity: 2.8 mV

## 2021-04-25 NOTE — Patient Instructions (Addendum)

## 2021-04-25 NOTE — Progress Notes (Signed)
Wound check appointment. Steri-strips removed. Wound without redness or edema. Incision edges approximated, wound well healed. Normal device function. Thresholds, sensing, and impedances consistent with implant measurements. Device programmed at chronic settings considering gen change only. Histogram distribution appropriate for patient and level of activity. AT/AF burden 5.9%, EGM appears AF w/ longest in duration 12 hours and 11 minutes. No ventricular rates noted. Patient educated about wound care, arm mobility. ROV in 3 months with implanting physician.

## 2021-07-18 LAB — CUP PACEART REMOTE DEVICE CHECK
Battery Remaining Longevity: 161 mo
Battery Voltage: 3.2 V
Brady Statistic AP VP Percent: 0.15 %
Brady Statistic AP VS Percent: 87.9 %
Brady Statistic AS VP Percent: 0.01 %
Brady Statistic AS VS Percent: 11.94 %
Brady Statistic RA Percent Paced: 88.13 %
Brady Statistic RV Percent Paced: 0.16 %
Date Time Interrogation Session: 20230413053717
Implantable Lead Implant Date: 20110513
Implantable Lead Implant Date: 20110513
Implantable Lead Location: 753859
Implantable Lead Location: 753860
Implantable Lead Model: 5092
Implantable Lead Model: 5594
Implantable Pulse Generator Implant Date: 20230109
Lead Channel Impedance Value: 323 Ohm
Lead Channel Impedance Value: 361 Ohm
Lead Channel Impedance Value: 380 Ohm
Lead Channel Impedance Value: 399 Ohm
Lead Channel Pacing Threshold Amplitude: 0.5 V
Lead Channel Pacing Threshold Amplitude: 0.625 V
Lead Channel Pacing Threshold Pulse Width: 0.4 ms
Lead Channel Pacing Threshold Pulse Width: 0.4 ms
Lead Channel Sensing Intrinsic Amplitude: 2.75 mV
Lead Channel Sensing Intrinsic Amplitude: 2.75 mV
Lead Channel Sensing Intrinsic Amplitude: 8.75 mV
Lead Channel Sensing Intrinsic Amplitude: 8.75 mV
Lead Channel Setting Pacing Amplitude: 1.5 V
Lead Channel Setting Pacing Amplitude: 2 V
Lead Channel Setting Pacing Pulse Width: 0.4 ms
Lead Channel Setting Sensing Sensitivity: 2.8 mV

## 2021-07-22 ENCOUNTER — Ambulatory Visit (INDEPENDENT_AMBULATORY_CARE_PROVIDER_SITE_OTHER): Payer: Medicare Other | Admitting: Internal Medicine

## 2021-07-22 ENCOUNTER — Telehealth: Payer: Self-pay | Admitting: Internal Medicine

## 2021-07-22 ENCOUNTER — Encounter: Payer: Self-pay | Admitting: Internal Medicine

## 2021-07-22 VITALS — BP 140/72 | HR 79 | Ht 67.0 in | Wt 127.0 lb

## 2021-07-22 DIAGNOSIS — Z950 Presence of cardiac pacemaker: Secondary | ICD-10-CM

## 2021-07-22 DIAGNOSIS — I495 Sick sinus syndrome: Secondary | ICD-10-CM

## 2021-07-22 NOTE — Telephone Encounter (Signed)
Daughter called to say that patient primary care is [facility] home health. Please advise ?

## 2021-07-22 NOTE — Progress Notes (Signed)
? ? ? ? ?HPI ?Nathan Alvarez returns today for followup of his PPM. He is a pleasant 88 yo man with a h/o sinus node dysfunction, s/p PPM insertion, HTN, and atrial fib. He has not had syncope. He has mild edema. He denies chest pain or sob.  ?Allergies  ?Allergen Reactions  ? Penicillins   ?  unknown  ? ? ? ?Current Outpatient Medications  ?Medication Sig Dispense Refill  ? acetaminophen (TYLENOL) 325 MG tablet Take 2 tablets (650 mg total) by mouth every 6 (six) hours as needed for mild pain (or Fever >/= 101). 12 tablet 1  ? aspirin EC 81 MG EC tablet Take 1 tablet (81 mg total) by mouth daily with breakfast. Swallow whole. 30 tablet 11  ? calcium carbonate (OS-CAL - DOSED IN MG OF ELEMENTAL CALCIUM) 1250 (500 Ca) MG tablet Take 1 tablet (500 mg of elemental calcium total) by mouth 2 (two) times daily with a meal. 60 tablet 1  ? carvedilol (COREG) 6.25 MG tablet Take 1 tablet (6.25 mg total) by mouth 2 (two) times daily. For BP and Heart 180 tablet 3  ? Cholecalciferol (VITAMIN D3 PO) Take 200 Units by mouth daily.    ? Ferrous Sulfate (IRON) 325 (65 Fe) MG TABS Take 325 mg by mouth daily.    ? losartan (COZAAR) 25 MG tablet Take 1 tablet (25 mg total) by mouth daily. 30 tablet 1  ? Skin Protectants, Misc. (BAZA PROTECT EX) Apply 1 application topically See admin instructions. Apply to affected areas of buttocks, sacrum, and genitals twice daily, may apply as needed with each incontinence episode    ? vitamin B-12 (CYANOCOBALAMIN) 1000 MCG tablet Take 1,000 mcg by mouth daily.    ? JANUVIA 50 MG tablet Take 1 tablet (50 mg total) by mouth daily. (Patient not taking: Reported on 07/22/2021) 30 tablet 1  ? Nystatin (GERHARDT'S BUTT CREAM) CREA Apply 1 application topically 2 (two) times daily. Each shift and prn incontinence. (Patient not taking: Reported on 07/22/2021) 1 each 0  ? PARoxetine (PAXIL) 10 MG tablet Take 10 mg by mouth daily. (Patient not taking: Reported on 07/22/2021)    ? ?No current  facility-administered medications for this visit.  ? ? ? ?Past Medical History:  ?Diagnosis Date  ? A-fib ([place])   ? Dizziness   ? DM (diabetes mellitus) ([place])   ? Esophagitis, reflux   ? Heart block   ? Hyperlipidemia   ? Hypertension, essential, benign   ? Memory deficit   ? Pathological fracture of left hip due to age-related osteoporosis ([place]) 04/25/2019  ? SOB (shortness of breath)   ? SSS (sick sinus syndrome) ([place])   ? ? ?ROS: ? ? All systems reviewed and negative except as noted in the HPI. ? ? ?Past Surgical History:  ?Procedure Laterality Date  ? CATARACT EXTRACTION Right   ? HIP ARTHROPLASTY Left 04/22/2019  ? Procedure: ARTHROPLASTY BIPOLAR HIP (HEMIARTHROPLASTY);  Surgeon: Altamese Rossville, MD;  Location: [facility];  Service: Orthopedics;  Laterality: Left;  ? INGUINAL HERNIA REPAIR    ? INSERT / REPLACE / REMOVE PACEMAKER    ? PACEMAKER INSERTION    ? PPM GENERATOR CHANGEOUT N/A 04/15/2021  ? Procedure: PPM GENERATOR CHANGEOUT;  Surgeon: Evans Lance, MD;  Location: [facility];  Service: Cardiovascular;  Laterality: N/A;  ? TONSILLECTOMY AND ADENOIDECTOMY    ? ? ? ?Family History  ?Problem Relation Age of Onset  ? Heart disease Mother   ? ? ? ?  Social History  ? ?Socioeconomic History  ? Marital status: Married  ?  Spouse name: Not on file  ? Number of children: Not on file  ? Years of education: Not on file  ? Highest education level: Not on file  ?Occupational History  ? Not on file  ?Tobacco Use  ? Smoking status: Former  ?  Types: Cigarettes  ?  Quit date: 01/01/1956  ?  Years since quitting: 65.6  ?  Passive exposure: Never  ? Smokeless tobacco: Never  ?Vaping Use  ? Vaping Use: Never used  ?Substance and Sexual Activity  ? Alcohol use: No  ? Drug use: No  ? Sexual activity: Not Currently  ?  Comment: MARRIED  ?Other Topics Concern  ? Not on file  ?Social History Narrative  ? Not on file  ? ?Social Determinants of Health  ? ?Financial Resource Strain: Not on file  ?Food Insecurity: Not on file   ?Transportation Needs: Not on file  ?Physical Activity: Not on file  ?Stress: Not on file  ?Social Connections: Not on file  ?Intimate Partner Violence: Not on file  ? ? ? ?BP 140/72   Pulse 79   Ht 5\' 7"  (1.702 m)   Wt 127 lb (57.6 kg)   SpO2 97%   BMI 19.89 kg/m?  ? ?Physical Exam: ? ?Well appearing NAD ?HEENT: Unremarkable ?Neck:  No JVD, no thyromegally ?Lymphatics:  No adenopathy ?Back:  No CVA tenderness ?Lungs:  Clear with no wheezes ?HEART:  Regular rate rhythm, no murmurs, no rubs, no clicks ?Abd:  soft, positive bowel sounds, no organomegally, no rebound, no guarding ?Ext:  2 plus pulses, no edema, no cyanosis, no clubbing ?Skin:  No rashes no nodules ?Neuro:  CN II through XII intact, motor grossly intact ? ?EKG - nsr with atrial pacing ? ?DEVICE  ?Normal device function.  See PaceArt for details.  ? ?Assess/Plan:  ?1. Sinus node dysfunction - he is asymptomatic, s/p PPM insertion. ?2. PPM -his medtronic DDD PM is working normally.  ?3. HTN - his bp is well controlled.  ?4. Malnutrition - he lost weight but his daughter notes that he has started to gain it back as he is now in assisted living. ? ?Carleene Overlie Ronen Bromwell,MD ?

## 2021-07-22 NOTE — Patient Instructions (Signed)
Medication Instructions:  ?Your physician recommends that you continue on your current medications as directed. Please refer to the Current Medication list given to you today. ? ?Labwork: ?None ordered. ? ?Testing/Procedures: ?None ordered. ? ?Follow-Up: ?Your physician wants you to follow-up in: one year with Lewayne Bunting, MD or one of the following Advanced Practice Providers on your designated Care Team:   ?Francis Dowse, PA-C ?Casimiro Needle "Mardelle Matte" [place], PA-C ? ?Remote monitoring is used to monitor your Pacemaker from home. This monitoring reduces the number of office visits required to check your device to one time per year. It allows [place] to keep an eye on the functioning of your device to ensure it is working properly. You are scheduled for a device check from home on 10/17/2021. You may send your transmission at any time that day. If you have a wireless device, the transmission will be sent automatically. After your physician reviews your transmission, you will receive a postcard with your next transmission date. ? ?Any Other Special Instructions Will Be Listed Below (If Applicable). ? ?If you need a refill on your cardiac medications before your next appointment, please call your pharmacy.  ? ?Important Information About Sugar ? ? ? ? ? ? ? ?

## 2021-07-23 NOTE — Telephone Encounter (Addendum)
Left message requesting call back if any action needed. ? ? ?

## 2021-10-17 ENCOUNTER — Ambulatory Visit (INDEPENDENT_AMBULATORY_CARE_PROVIDER_SITE_OTHER): Payer: Medicare Other

## 2021-10-17 DIAGNOSIS — I495 Sick sinus syndrome: Secondary | ICD-10-CM | POA: Diagnosis not present

## 2021-10-18 LAB — CUP PACEART REMOTE DEVICE CHECK
Battery Remaining Longevity: 159 mo
Battery Voltage: 3.16 V
Brady Statistic AP VP Percent: 0.12 %
Brady Statistic AP VS Percent: 91.77 %
Brady Statistic AS VP Percent: 0.01 %
Brady Statistic AS VS Percent: 8.1 %
Brady Statistic RA Percent Paced: 92.34 %
Brady Statistic RV Percent Paced: 0.13 %
Date Time Interrogation Session: 20230713053408
Implantable Lead Implant Date: 20110513
Implantable Lead Implant Date: 20110513
Implantable Lead Location: 753859
Implantable Lead Location: 753860
Implantable Lead Model: 5092
Implantable Lead Model: 5594
Implantable Pulse Generator Implant Date: 20230109
Lead Channel Impedance Value: 380 Ohm
Lead Channel Impedance Value: 418 Ohm
Lead Channel Impedance Value: 437 Ohm
Lead Channel Impedance Value: 456 Ohm
Lead Channel Pacing Threshold Amplitude: 0.5 V
Lead Channel Pacing Threshold Amplitude: 0.625 V
Lead Channel Pacing Threshold Pulse Width: 0.4 ms
Lead Channel Pacing Threshold Pulse Width: 0.4 ms
Lead Channel Sensing Intrinsic Amplitude: 11 mV
Lead Channel Sensing Intrinsic Amplitude: 11 mV
Lead Channel Sensing Intrinsic Amplitude: 3.375 mV
Lead Channel Sensing Intrinsic Amplitude: 3.375 mV
Lead Channel Setting Pacing Amplitude: 1.5 V
Lead Channel Setting Pacing Amplitude: 2 V
Lead Channel Setting Pacing Pulse Width: 0.4 ms
Lead Channel Setting Sensing Sensitivity: 2.8 mV

## 2021-10-28 ENCOUNTER — Telehealth: Payer: Self-pay

## 2021-10-28 ENCOUNTER — Telehealth: Payer: Self-pay | Admitting: Internal Medicine

## 2021-10-28 NOTE — Telephone Encounter (Signed)
Nathan Alvarez returning nurse call.

## 2021-10-28 NOTE — Telephone Encounter (Signed)
Patient's Daughter in [place] called. The patient has a new PCP that needs to be getting the home remote transmission reports.  The new PCP is a [place], ANP. She is part of [facility].  The patient is now at an Assisted Living Facilty

## 2021-10-28 NOTE — Telephone Encounter (Signed)
Unsuccessful telephone encounter to patient's daughter to discuss her request for PCP to receive remote transmission reports. Unfortunately this is not a request that we are able to fulfill as PCPs typically do not interpret remote transmissions or make medication adjustments based on findings. Will discuss with Daughter upon call back to [phone].

## 2021-10-28 NOTE — Telephone Encounter (Signed)
Spoke with Daughter. She just wanted to make sure device clinic had patient's new PCP on file just incase remote transmission needed to be sent over. Advised daughter that PCP had been changed in EMR however we do not typically send remote transmissions to PCP. Daughter appreciative of follow up and complementary of the care [facility] has provided patient. Will continue to monitor remote transmissions.

## 2021-11-04 NOTE — Progress Notes (Signed)
Remote pacemaker transmission.   

## 2021-11-06 ENCOUNTER — Ambulatory Visit (INDEPENDENT_AMBULATORY_CARE_PROVIDER_SITE_OTHER): Payer: Medicare Other | Admitting: Podiatry

## 2021-11-06 DIAGNOSIS — M79674 Pain in right toe(s): Secondary | ICD-10-CM

## 2021-11-06 DIAGNOSIS — B351 Tinea unguium: Secondary | ICD-10-CM

## 2021-11-06 DIAGNOSIS — M79675 Pain in left toe(s): Secondary | ICD-10-CM | POA: Diagnosis not present

## 2021-11-08 NOTE — Progress Notes (Signed)
  Subjective:  Patient ID: Aundra Millet, male    DOB: [birth year],  MRN: [id number]  Chief Complaint  Patient presents with   Nail Problem    Nail trim    88 y.o. male returns for the above complaint.  Patient presents with thickened elongated dystrophic toenails x10 mild pain on palpation.  He is not able to do it himself he denies any other acute complaints he would like for me to do it.  Objective:  There were no vitals filed for this visit. Podiatric Exam: Vascular: dorsalis pedis and posterior tibial pulses are palpable bilateral. Capillary return is immediate. Temperature gradient is WNL. Skin turgor WNL  Sensorium: Normal Semmes Weinstein monofilament test. Normal tactile sensation bilaterally. Nail Exam: Pt has thick disfigured discolored nails with subungual debris noted bilateral entire nail hallux through fifth toenails.  Pain on palpation to the nails. Ulcer Exam: There is no evidence of ulcer or pre-ulcerative changes or infection. Orthopedic Exam: Muscle tone and strength are WNL. No limitations in general ROM. No crepitus or effusions noted.  Skin: No Porokeratosis. No infection or ulcers    Assessment & Plan:  No diagnosis found.  Patient was evaluated and treated and all questions answered.  Onychomycosis with pain  -Nails palliatively debrided as below. -Educated on self-care  Procedure: Nail Debridement Rationale: pain  Type of Debridement: manual, sharp debridement. Instrumentation: Nail nipper, rotary burr. Number of Nails: 10  Procedures and Treatment: Consent by patient was obtained for treatment procedures. The patient understood the discussion of treatment and procedures well. All questions were answered thoroughly reviewed. Debridement of mycotic and hypertrophic toenails, 1 through 5 bilateral and clearing of subungual debris. No ulceration, no infection noted.  Return Visit-Office Procedure: Patient instructed to return to the office for a follow up  visit 3 months for continued evaluation and treatment.  Nicholes Rough, DPM    No follow-ups on file.

## 2021-12-19 ENCOUNTER — Emergency Department (HOSPITAL_COMMUNITY)
Admission: EM | Admit: 2021-12-19 | Discharge: 2021-12-20 | Disposition: A | Discharge status: Home / Self Care | Payer: Medicare Other | Attending: Emergency Medicine | Admitting: Emergency Medicine

## 2021-12-19 ENCOUNTER — Emergency Department (HOSPITAL_COMMUNITY): Payer: Medicare Other

## 2021-12-19 DIAGNOSIS — Z79899 Other long term (current) drug therapy: Secondary | ICD-10-CM | POA: Diagnosis not present

## 2021-12-19 DIAGNOSIS — Z20822 Contact with and (suspected) exposure to covid-19: Secondary | ICD-10-CM | POA: Insufficient documentation

## 2021-12-19 DIAGNOSIS — Z7982 Long term (current) use of aspirin: Secondary | ICD-10-CM | POA: Diagnosis not present

## 2021-12-19 DIAGNOSIS — E119 Type 2 diabetes mellitus without complications: Secondary | ICD-10-CM | POA: Diagnosis not present

## 2021-12-19 DIAGNOSIS — J189 Pneumonia, unspecified organism: Secondary | ICD-10-CM

## 2021-12-19 DIAGNOSIS — R509 Fever, unspecified: Secondary | ICD-10-CM | POA: Insufficient documentation

## 2021-12-19 DIAGNOSIS — I10 Essential (primary) hypertension: Secondary | ICD-10-CM | POA: Diagnosis not present

## 2021-12-19 DIAGNOSIS — N3000 Acute cystitis without hematuria: Secondary | ICD-10-CM

## 2021-12-19 DIAGNOSIS — D72829 Elevated white blood cell count, unspecified: Secondary | ICD-10-CM | POA: Insufficient documentation

## 2021-12-19 LAB — COMPREHENSIVE METABOLIC PANEL
ALT: 11 U/L (ref 0–44)
AST: 16 U/L (ref 15–41)
Albumin: 3.3 g/dL — ABNORMAL LOW (ref 3.5–5.0)
Alkaline Phosphatase: 67 U/L (ref 38–126)
Anion gap: 10 (ref 5–15)
BUN: 48 mg/dL — ABNORMAL HIGH (ref 8–23)
CO2: 23 mmol/L (ref 22–32)
Calcium: 9.1 mg/dL (ref 8.9–10.3)
Chloride: 106 mmol/L (ref 98–111)
Creatinine, Ser: 1 mg/dL (ref 0.61–1.24)
GFR, Estimated: >60 mL/min (ref >60)
Glucose, Bld: 59 mg/dL — ABNORMAL LOW (ref 70–99)
Potassium: 5.2 mmol/L — ABNORMAL HIGH (ref 3.5–5.1)
Sodium: 139 mmol/L (ref 135–145)
Total Bilirubin: 0.3 mg/dL (ref 0.3–1.2)
Total Protein: 6.6 g/dL (ref 6.5–8.1)

## 2021-12-19 LAB — CBC WITH DIFFERENTIAL/PLATELET
Abs Immature Granulocytes: 0.2 10*3/uL — ABNORMAL HIGH (ref 0.00–0.07)
Basophils Absolute: 0.1 10*3/uL (ref 0.0–0.1)
Basophils Relative: 0 %
Eosinophils Absolute: 0 10*3/uL (ref 0.0–0.5)
Eosinophils Relative: 0 %
HCT: 30.5 % — ABNORMAL LOW (ref 39.0–52.0)
Hemoglobin: 10.3 g/dL — ABNORMAL LOW (ref 13.0–17.0)
Immature Granulocytes: 1 %
Lymphocytes Relative: 7 %
Lymphs Abs: 1.4 10*3/uL (ref 0.7–4.0)
MCH: 31 pg (ref 26.0–34.0)
MCHC: 33.8 g/dL (ref 30.0–36.0)
MCV: 91.9 fL (ref 80.0–100.0)
Monocytes Absolute: 1.5 10*3/uL — ABNORMAL HIGH (ref 0.1–1.0)
Monocytes Relative: 7 %
Neutro Abs: 18.3 10*3/uL — ABNORMAL HIGH (ref 1.7–7.7)
Neutrophils Relative %: 85 %
Platelets: 209 10*3/uL (ref 150–400)
RBC: 3.32 MIL/uL — ABNORMAL LOW (ref 4.22–5.81)
RDW: 15.4 % (ref 11.5–15.5)
WBC: 21.6 10*3/uL — ABNORMAL HIGH (ref 4.0–10.5)
nRBC: 0 % (ref 0.0–0.2)

## 2021-12-19 LAB — RESP PANEL BY RT-PCR (FLU A&B, COVID) ARPGX2
Influenza A by PCR: NEGATIVE
Influenza B by PCR: NEGATIVE
SARS Coronavirus 2 by RT PCR: NEGATIVE

## 2021-12-19 MED ORDER — SODIUM CHLORIDE 0.9 % IV SOLN: 250.0000 mL | INTRAVENOUS | Status: DC

## 2021-12-19 MED ORDER — NOREPINEPHRINE 4 MG/250ML-% IV SOLN: 2.0000 ug/min | INTRAVENOUS | Status: DC

## 2021-12-19 NOTE — ED Provider Notes (Signed)
Plan at signout - f/u on repeat CXR, urine, COVID, reassess If patient remains stable/appropriate may be discharged back to facility    Zadie Rhine, MD 12/19/21 2310

## 2021-12-19 NOTE — ED Provider Notes (Signed)
[facility] EMERGENCY DEPARTMENT Provider Note   CSN: [id number] Arrival date & time: 12/19/21  1812     History  Chief Complaint  Patient presents with   Fever    Nathan Alvarez is a 88 y.o. male.  Patient is a 88 year old male with a past medical history of hypertension, diabetes, sick sinus syndrome with pacemaker in place presenting to the emergency department with fever.  Patient was at his nursing home today complaining of feeling generally unwell and was noted to be febrile.  They gave him Tylenol and transported him to the emergency department.  Patient otherwise states that he has a mild nonproductive cough with some shortness of breath.  He denies any nausea, vomiting, abdominal pain, dysuria or hematuria, diarrhea or constipation or rashes.  The history is provided by the patient and the nursing home.  Fever      Home Medications Prior to Admission medications   Medication Sig Start Date End Date Taking? Authorizing Provider  acetaminophen (TYLENOL) 325 MG tablet Take 2 tablets (650 mg total) by mouth every 6 (six) hours as needed for mild pain (or Fever >/= 101). 12/16/20   Shon Hale, MD  aspirin EC 81 MG EC tablet Take 1 tablet (81 mg total) by mouth daily with breakfast. Swallow whole. 12/16/20   Shon Hale, MD  calcium carbonate (OS-CAL - DOSED IN MG OF ELEMENTAL CALCIUM) 1250 (500 Ca) MG tablet Take 1 tablet (500 mg of elemental calcium total) by mouth 2 (two) times daily with a meal. 12/16/20   Emokpae, Courage, MD  carvedilol (COREG) 6.25 MG tablet Take 1 tablet (6.25 mg total) by mouth 2 (two) times daily. For BP and Heart 12/16/20   Shon Hale, MD  Cholecalciferol (VITAMIN D3 PO) Take 200 Units by mouth daily.    [provider]  Ferrous Sulfate (IRON) 325 (65 Fe) MG TABS Take 325 mg by mouth daily.    [provider]  JANUVIA 50 MG tablet Take 1 tablet (50 mg total) by mouth daily. Patient not taking: Reported  on 07/22/2021 12/16/20   Shon Hale, MD  losartan (COZAAR) 25 MG tablet Take 1 tablet (25 mg total) by mouth daily. 03/23/21   Hongalgi, Maximino Greenland, MD  Nystatin (GERHARDT'S BUTT CREAM) CREA Apply 1 application topically 2 (two) times daily. Each shift and prn incontinence. Patient not taking: Reported on 07/22/2021 03/22/21   Elease Etienne, MD  PARoxetine (PAXIL) 10 MG tablet Take 10 mg by mouth daily. Patient not taking: Reported on 07/22/2021 03/04/21   [provider]  Skin Protectants, Misc. (BAZA PROTECT EX) Apply 1 application topically See admin instructions. Apply to affected areas of buttocks, sacrum, and genitals twice daily, may apply as needed with each incontinence episode    [provider]  vitamin B-12 (CYANOCOBALAMIN) 1000 MCG tablet Take 1,000 mcg by mouth daily.    [provider]      Allergies    Penicillins    Review of Systems   Review of Systems  Constitutional:  Positive for fever.    Physical Exam Updated Vital Signs BP (!) 119/57   Pulse 66   Temp 98.2 F (36.8 C) (Oral)   Resp (!) 25   SpO2 96%  Physical Exam Vitals and nursing note reviewed.  Constitutional:      General: He is not in acute distress.    Appearance: Normal appearance.  HENT:     Head: Normocephalic and atraumatic.  Nose: Nose normal.     Mouth/Throat:     Mouth: Mucous membranes are moist.     Pharynx: Oropharynx is clear.  Eyes:     Extraocular Movements: Extraocular movements intact.     Conjunctiva/sclera: Conjunctivae normal.  Cardiovascular:     Rate and Rhythm: Normal rate and regular rhythm.     Pulses: Normal pulses.     Heart sounds: Normal heart sounds.  Pulmonary:     Effort: Pulmonary effort is normal.     Breath sounds: Normal breath sounds.  Abdominal:     General: Abdomen is flat.     Palpations: Abdomen is soft.     Tenderness: There is no abdominal tenderness.  Musculoskeletal:        General: Normal range of motion.      Cervical back: Normal range of motion and neck supple.  Skin:    General: Skin is warm and dry.  Neurological:     General: No focal deficit present.     Mental Status: He is alert and oriented to person, place, and time.  Psychiatric:        Mood and Affect: Mood normal.        Behavior: Behavior normal.     ED Results / Procedures / Treatments   Labs (all labs ordered are listed, but only abnormal results are displayed) Labs Reviewed  CBC WITH DIFFERENTIAL/PLATELET - Abnormal; Notable for the following components:      Result Value   WBC 21.6 (*)    RBC 3.32 (*)    Hemoglobin 10.3 (*)    HCT 30.5 (*)    Neutro Abs 18.3 (*)    Monocytes Absolute 1.5 (*)    Abs Immature Granulocytes 0.20 (*)    All other components within normal limits  RESP PANEL BY RT-PCR (FLU A&B, COVID) ARPGX2  COMPREHENSIVE METABOLIC PANEL  URINALYSIS, ROUTINE W REFLEX MICROSCOPIC    EKG None  Radiology DG Chest Port 1 View  Result Date: 12/19/2021 CLINICAL DATA:  Fever with cough. EXAM: PORTABLE CHEST 1 VIEW COMPARISON:  Chest x-ray 03/17/2021 FINDINGS: Left-sided pacemaker is present. The heart is enlarged, unchanged. Questionable focal slightly nodular density is seen in the right lower lung projecting inferior to the lead. Lungs are otherwise clear. There is no pleural effusion or pneumothorax. No acute fractures are seen. IMPRESSION: 1. Can not exclude focal/nodular density in the right lower lung versus overlying external artifact from chest lead. This can be further evaluated with repeat chest x-ray after removal of chest leads versus CT. 2. Cardiomegaly. Electronically Signed   By: Darliss Cheney M.D.   On: 12/19/2021 19:43    Procedures Procedures    Medications Ordered in ED Medications - No data to display  ED Course/ Medical Decision Making/ A&P Clinical Course as of 12/19/21 2332  Thu Dec 19, 2021  2331 Initial x-ray concerning for possible consolidation vs artifact and repeat xray  was recommended. He will have 2 view x-ray without leads on chest to further evaluate for pneumonia. Patient signed out to Dr. Agustin Cree pending rpt CXR and UA. [VK]    Clinical Course User Index [VK] Phoebe Sharps, DO                           Medical Decision Making This patient presents to the ED with chief complaint(s) of fever with pertinent past medical history of hypertension, diabetes, sick sinus syndrome which further complicates the  presenting complaint. The complaint involves an extensive differential diagnosis and also carries with it a high risk of complications and morbidity.    The differential diagnosis includes patient does not appear septic on presentation, concern for fever from pneumonia, viral syndrome, UTI, patient denies any intra-abdominal complaints and has no abdominal tenderness making intra-abdominal infection less likely, he has no evidence of any rash or skin changes making cellulitis unlikely  Additional history obtained: Additional history obtained from nursing home/care facility Records reviewed cardiology records  ED Course and Reassessment: Upon patient's arrival to the emergency department he is afebrile and well-appearing.  He will have labs, urine, chest x-ray and COVID swab to evaluate for the source of his fever.  He will be closely monitored.  Independent labs interpretation:  The following labs were independently interpreted: Elevated white count, mildly elevated potassium with normal creatinine, urine pending  Independent visualization of imaging: - I independently visualized the following imaging with scope of interpretation limited to determining acute life threatening conditions related to emergency care: Chest x-ray, which revealed possible right lower lobe infiltrate versus artifact, pending repeat x-ray      Amount and/or Complexity of Data Reviewed Labs: ordered. Radiology: ordered.           Final Clinical Impression(s)  / ED Diagnoses Final diagnoses:  None    Rx / DC Orders ED Discharge Orders     None         Phoebe Sharps, DO 12/19/21 2332

## 2021-12-19 NOTE — ED Notes (Signed)
Patient provided urinal and instructed to provide sample.

## 2021-12-19 NOTE — ED Triage Notes (Signed)
Pt BIB [facility] from [facility] nursing facility d/t fever of 105. Facility gave tylenol abt ago. NS and 4mg  zofran given by EMS.A&O x4. Pt stated he does not feel well overall.    165/77 65  95% O2 CBG 124

## 2021-12-20 DIAGNOSIS — R509 Fever, unspecified: Secondary | ICD-10-CM | POA: Diagnosis not present

## 2021-12-20 LAB — URINALYSIS, ROUTINE W REFLEX MICROSCOPIC
Bacteria, UA: NONE SEEN
Bilirubin Urine: NEGATIVE
Glucose, UA: NEGATIVE mg/dL
Ketones, ur: NEGATIVE mg/dL
Nitrite: NEGATIVE
Protein, ur: NEGATIVE mg/dL
Specific Gravity, Urine: 1.016 (ref 1.005–1.030)
pH: 5 (ref 5.0–8.0)

## 2021-12-20 LAB — CBG MONITORING, ED: Glucose-Capillary: 84 mg/dL (ref 70–99)

## 2021-12-20 MED ORDER — FOSFOMYCIN TROMETHAMINE 3 G PO PACK
3.0000 g | PACK | Freq: Once | ORAL | Status: AC
  Administered 2021-12-20: 3 g via ORAL
  Filled 2021-12-20: qty 3

## 2021-12-20 MED ORDER — DOXYCYCLINE HYCLATE 100 MG PO CAPS: 100.0000 mg | ORAL_CAPSULE | Freq: Two times a day (BID) | ORAL | 0 refills | Status: DC

## 2021-12-20 MED ORDER — SODIUM CHLORIDE 0.9 % IV BOLUS (SEPSIS)
1000.0000 mL | Freq: Once | INTRAVENOUS | Status: AC
  Administered 2021-12-20: 1000 mL via INTRAVENOUS

## 2021-12-20 MED ORDER — DOXYCYCLINE HYCLATE 100 MG PO TABS
100.0000 mg | ORAL_TABLET | Freq: Once | ORAL | Status: AC
  Administered 2021-12-20: 100 mg via ORAL
  Filled 2021-12-20: qty 1

## 2021-12-20 NOTE — ED Provider Notes (Signed)
Patient stable in the ER.  Vitals appropriate.  He is awake and alert.  X-ray shows possible pneumonia.  Reports history of penicillin allergy.  Will place on doxycycline.  No focal abdominal tenderness.  He has no other complaints at this time.  His mental status appears appropriate and at his baseline.  We will also attempt to send urine prior to transfer back to facility   Zadie Rhine, MD 12/20/21 [phone]

## 2021-12-20 NOTE — ED Provider Notes (Signed)
Possible UTI noted, will give fosfomycin.  Urine culture ordered.  Continue doxy for possible CAP   Zadie Rhine, MD 12/20/21 0502

## 2021-12-20 NOTE — ED Notes (Signed)
[phone] Hilda Lias pt daughter in law would like a update

## 2021-12-20 NOTE — ED Notes (Signed)
[facility]AR CALLED  °

## 2021-12-22 LAB — URINE CULTURE: Culture: 60000 — AB

## 2021-12-23 ENCOUNTER — Telehealth (HOSPITAL_BASED_OUTPATIENT_CLINIC_OR_DEPARTMENT_OTHER): Payer: Self-pay | Admitting: *Deleted

## 2021-12-23 NOTE — Telephone Encounter (Signed)
Post ED Visit - Positive Culture Follow-up  Culture report reviewed by antimicrobial stewardship pharmacist: [place] Team []  Elenor Quinones, Pharm.D. []  Heide Guile, Pharm.D., BCPS AQ-ID []  Parks Neptune, Pharm.D., BCPS []  Alycia Rossetti, Pharm.D., BCPS []  [place], Pharm.D., BCPS, AAHIVP []  Legrand Como, Pharm.D., BCPS, AAHIVP []  Salome Arnt, PharmD, BCPS []  Johnnette Gourd, PharmD, BCPS []  Hughes Better, PharmD, BCPS []  Leeroy Cha, PharmD []  Laqueta Linden, PharmD, BCPS []  Albertina Parr, PharmD  [place] Team []  Leodis Sias, PharmD []  Lindell Spar, PharmD []  Royetta Asal, PharmD []  Graylin Shiver, Rph []  Rema Fendt) Glennon Mac, PharmD []  Arlyn Dunning, PharmD []  Netta Cedars, PharmD []  Dia Sitter, PharmD []  Leone Haven, PharmD []  Gretta Arab, PharmD []  Theodis Shove, PharmD []  Peggyann Juba, PharmD []  Reuel Boom, PharmD   Positive urine culture Treated with Doxycycline Hyclate, organism sensitive to the same and no further patient follow-up is required at this time.  Luisa Hart, Pharm D  Harlon Flor [place] 12/23/2021, 9:45 AM

## 2022-01-16 ENCOUNTER — Ambulatory Visit (INDEPENDENT_AMBULATORY_CARE_PROVIDER_SITE_OTHER): Payer: Medicare Other

## 2022-01-16 DIAGNOSIS — I495 Sick sinus syndrome: Secondary | ICD-10-CM

## 2022-01-16 LAB — CUP PACEART REMOTE DEVICE CHECK
Battery Remaining Longevity: 155 mo
Battery Voltage: 3.12 V
Brady Statistic AP VP Percent: 0.54 %
Brady Statistic AP VS Percent: 95.48 %
Brady Statistic AS VP Percent: 0.05 %
Brady Statistic AS VS Percent: 3.92 %
Brady Statistic RA Percent Paced: 97.01 %
Brady Statistic RV Percent Paced: 0.6 %
Date Time Interrogation Session: 20231012060312
Implantable Lead Implant Date: 20110513
Implantable Lead Implant Date: 20110513
Implantable Lead Location: 753859
Implantable Lead Location: 753860
Implantable Lead Model: 5092
Implantable Lead Model: 5594
Implantable Pulse Generator Implant Date: 20230109
Lead Channel Impedance Value: 361 Ohm
Lead Channel Impedance Value: 380 Ohm
Lead Channel Impedance Value: 399 Ohm
Lead Channel Impedance Value: 418 Ohm
Lead Channel Pacing Threshold Amplitude: 0.625 V
Lead Channel Pacing Threshold Amplitude: 0.625 V
Lead Channel Pacing Threshold Pulse Width: 0.4 ms
Lead Channel Pacing Threshold Pulse Width: 0.4 ms
Lead Channel Sensing Intrinsic Amplitude: 3.625 mV
Lead Channel Sensing Intrinsic Amplitude: 3.625 mV
Lead Channel Sensing Intrinsic Amplitude: 9.25 mV
Lead Channel Sensing Intrinsic Amplitude: 9.25 mV
Lead Channel Setting Pacing Amplitude: 1.5 V
Lead Channel Setting Pacing Amplitude: 2 V
Lead Channel Setting Pacing Pulse Width: 0.4 ms
Lead Channel Setting Sensing Sensitivity: 2.8 mV

## 2022-01-27 NOTE — Progress Notes (Signed)
Remote pacemaker transmission.   

## 2022-02-11 ENCOUNTER — Ambulatory Visit: Payer: Medicare Other | Admitting: Podiatry

## 2022-02-26 ENCOUNTER — Encounter (HOSPITAL_COMMUNITY): Payer: Self-pay | Admitting: Radiology

## 2022-02-26 ENCOUNTER — Inpatient Hospital Stay (HOSPITAL_COMMUNITY): Payer: Medicare Other

## 2022-02-26 ENCOUNTER — Other Ambulatory Visit: Payer: Self-pay

## 2022-02-26 ENCOUNTER — Emergency Department (HOSPITAL_COMMUNITY): Payer: Medicare Other

## 2022-02-26 ENCOUNTER — Inpatient Hospital Stay (HOSPITAL_COMMUNITY)
Admission: EM | Admit: 2022-02-26 | Discharge: 2022-03-05 | DRG: 871 | Disposition: A | Discharge status: Home Health | Payer: Medicare Other | Source: Skilled Nursing Facility | Attending: Internal Medicine | Admitting: Internal Medicine

## 2022-02-26 DIAGNOSIS — Z950 Presence of cardiac pacemaker: Secondary | ICD-10-CM

## 2022-02-26 DIAGNOSIS — Z9841 Cataract extraction status, right eye: Secondary | ICD-10-CM | POA: Diagnosis not present

## 2022-02-26 DIAGNOSIS — Z79899 Other long term (current) drug therapy: Secondary | ICD-10-CM

## 2022-02-26 DIAGNOSIS — K2100 Gastro-esophageal reflux disease with esophagitis, without bleeding: Secondary | ICD-10-CM | POA: Diagnosis present

## 2022-02-26 DIAGNOSIS — I21A1 Myocardial infarction type 2: Secondary | ICD-10-CM | POA: Diagnosis present

## 2022-02-26 DIAGNOSIS — J9601 Acute respiratory failure with hypoxia: Secondary | ICD-10-CM | POA: Diagnosis present

## 2022-02-26 DIAGNOSIS — Z8249 Family history of ischemic heart disease and other diseases of the circulatory system: Secondary | ICD-10-CM | POA: Diagnosis not present

## 2022-02-26 DIAGNOSIS — J154 Pneumonia due to other streptococci: Secondary | ICD-10-CM | POA: Diagnosis present

## 2022-02-26 DIAGNOSIS — I5042 Chronic combined systolic (congestive) and diastolic (congestive) heart failure: Secondary | ICD-10-CM | POA: Diagnosis present

## 2022-02-26 DIAGNOSIS — R0602 Shortness of breath: Secondary | ICD-10-CM | POA: Diagnosis not present

## 2022-02-26 DIAGNOSIS — R7989 Other specified abnormal findings of blood chemistry: Secondary | ICD-10-CM

## 2022-02-26 DIAGNOSIS — Z87891 Personal history of nicotine dependence: Secondary | ICD-10-CM

## 2022-02-26 DIAGNOSIS — Z794 Long term (current) use of insulin: Secondary | ICD-10-CM

## 2022-02-26 DIAGNOSIS — K219 Gastro-esophageal reflux disease without esophagitis: Secondary | ICD-10-CM | POA: Diagnosis present

## 2022-02-26 DIAGNOSIS — I472 Ventricular tachycardia, unspecified: Secondary | ICD-10-CM | POA: Diagnosis present

## 2022-02-26 DIAGNOSIS — I5020 Unspecified systolic (congestive) heart failure: Secondary | ICD-10-CM | POA: Diagnosis not present

## 2022-02-26 DIAGNOSIS — N400 Enlarged prostate without lower urinary tract symptoms: Secondary | ICD-10-CM | POA: Diagnosis present

## 2022-02-26 DIAGNOSIS — I495 Sick sinus syndrome: Secondary | ICD-10-CM | POA: Diagnosis present

## 2022-02-26 DIAGNOSIS — Z1152 Encounter for screening for COVID-19: Secondary | ICD-10-CM

## 2022-02-26 DIAGNOSIS — I480 Paroxysmal atrial fibrillation: Secondary | ICD-10-CM | POA: Diagnosis present

## 2022-02-26 DIAGNOSIS — A419 Sepsis, unspecified organism: Principal | ICD-10-CM

## 2022-02-26 DIAGNOSIS — Z6820 Body mass index (BMI) 20.0-20.9, adult: Secondary | ICD-10-CM

## 2022-02-26 DIAGNOSIS — I110 Hypertensive heart disease with heart failure: Secondary | ICD-10-CM | POA: Diagnosis present

## 2022-02-26 DIAGNOSIS — E11649 Type 2 diabetes mellitus with hypoglycemia without coma: Secondary | ICD-10-CM | POA: Diagnosis present

## 2022-02-26 DIAGNOSIS — I10 Essential (primary) hypertension: Secondary | ICD-10-CM | POA: Diagnosis not present

## 2022-02-26 DIAGNOSIS — R6520 Severe sepsis without septic shock: Secondary | ICD-10-CM | POA: Diagnosis not present

## 2022-02-26 DIAGNOSIS — K529 Noninfective gastroenteritis and colitis, unspecified: Secondary | ICD-10-CM | POA: Diagnosis present

## 2022-02-26 DIAGNOSIS — E1129 Type 2 diabetes mellitus with other diabetic kidney complication: Secondary | ICD-10-CM | POA: Diagnosis not present

## 2022-02-26 DIAGNOSIS — I5022 Chronic systolic (congestive) heart failure: Secondary | ICD-10-CM | POA: Diagnosis not present

## 2022-02-26 DIAGNOSIS — Z880 Allergy status to penicillin: Secondary | ICD-10-CM

## 2022-02-26 DIAGNOSIS — Z96642 Presence of left artificial hip joint: Secondary | ICD-10-CM | POA: Diagnosis present

## 2022-02-26 DIAGNOSIS — E785 Hyperlipidemia, unspecified: Secondary | ICD-10-CM | POA: Diagnosis present

## 2022-02-26 DIAGNOSIS — I444 Left anterior fascicular block: Secondary | ICD-10-CM | POA: Diagnosis present

## 2022-02-26 DIAGNOSIS — R630 Anorexia: Secondary | ICD-10-CM | POA: Diagnosis present

## 2022-02-26 DIAGNOSIS — J189 Pneumonia, unspecified organism: Secondary | ICD-10-CM | POA: Diagnosis not present

## 2022-02-26 DIAGNOSIS — Z7982 Long term (current) use of aspirin: Secondary | ICD-10-CM

## 2022-02-26 DIAGNOSIS — E119 Type 2 diabetes mellitus without complications: Secondary | ICD-10-CM

## 2022-02-26 LAB — ECHOCARDIOGRAM COMPLETE
AV Mean grad: 5 mmHg
AV Peak grad: 8.6 mmHg
Ao pk vel: 1.47 m/s
Area-P 1/2: 2.34 cm2
Calc EF: 33.1 %
Height: 67 in
MV M vel: 3.55 m/s
MV Peak grad: 50.3 mmHg
P 1/2 time: 767 msec
S' Lateral: 3.5 cm
Single Plane A2C EF: 40 %
Single Plane A4C EF: 25.2 %
Weight: 2045.87 oz

## 2022-02-26 LAB — GLUCOSE, CAPILLARY
Glucose-Capillary: 117 mg/dL — ABNORMAL HIGH (ref 70–99)
Glucose-Capillary: 176 mg/dL — ABNORMAL HIGH (ref 70–99)
Glucose-Capillary: 47 mg/dL — ABNORMAL LOW (ref 70–99)
Glucose-Capillary: 52 mg/dL — ABNORMAL LOW (ref 70–99)
Glucose-Capillary: 53 mg/dL — ABNORMAL LOW (ref 70–99)
Glucose-Capillary: 98 mg/dL (ref 70–99)

## 2022-02-26 LAB — COMPREHENSIVE METABOLIC PANEL
ALT: 12 U/L (ref 0–44)
AST: 19 U/L (ref 15–41)
Albumin: 4 g/dL (ref 3.5–5.0)
Alkaline Phosphatase: 63 U/L (ref 38–126)
Anion gap: 9 (ref 5–15)
BUN: 49 mg/dL — ABNORMAL HIGH (ref 8–23)
CO2: 23 mmol/L (ref 22–32)
Calcium: 9 mg/dL (ref 8.9–10.3)
Chloride: 105 mmol/L (ref 98–111)
Creatinine, Ser: 1.04 mg/dL (ref 0.61–1.24)
GFR, Estimated: >60 mL/min (ref >60)
Glucose, Bld: 228 mg/dL — ABNORMAL HIGH (ref 70–99)
Potassium: 4.7 mmol/L (ref 3.5–5.1)
Sodium: 137 mmol/L (ref 135–145)
Total Bilirubin: 0.4 mg/dL (ref 0.3–1.2)
Total Protein: 8.1 g/dL (ref 6.5–8.1)

## 2022-02-26 LAB — MAGNESIUM: Magnesium: 2.1 mg/dL (ref 1.7–2.4)

## 2022-02-26 LAB — CBC WITH DIFFERENTIAL/PLATELET
Abs Immature Granulocytes: 0.17 10*3/uL — ABNORMAL HIGH (ref 0.00–0.07)
Basophils Absolute: 0.1 10*3/uL (ref 0.0–0.1)
Basophils Relative: 0 %
Eosinophils Absolute: 0.1 10*3/uL (ref 0.0–0.5)
Eosinophils Relative: 1 %
HCT: 39.9 % (ref 39.0–52.0)
Hemoglobin: 12.7 g/dL — ABNORMAL LOW (ref 13.0–17.0)
Immature Granulocytes: 1 %
Lymphocytes Relative: 15 %
Lymphs Abs: 3.2 10*3/uL (ref 0.7–4.0)
MCH: 30.1 pg (ref 26.0–34.0)
MCHC: 31.8 g/dL (ref 30.0–36.0)
MCV: 94.5 fL (ref 80.0–100.0)
Monocytes Absolute: 0.9 10*3/uL (ref 0.1–1.0)
Monocytes Relative: 4 %
Neutro Abs: 16.8 10*3/uL — ABNORMAL HIGH (ref 1.7–7.7)
Neutrophils Relative %: 79 %
Platelets: 323 10*3/uL (ref 150–400)
RBC: 4.22 MIL/uL (ref 4.22–5.81)
RDW: 14.6 % (ref 11.5–15.5)
WBC: 21.2 10*3/uL — ABNORMAL HIGH (ref 4.0–10.5)
nRBC: 0 % (ref 0.0–0.2)

## 2022-02-26 LAB — BLOOD GAS, VENOUS
Acid-base deficit: 1.1 mmol/L (ref 0.0–2.0)
Bicarbonate: 25.3 mmol/L (ref 20.0–28.0)
O2 Saturation: 21.5 %
Patient temperature: 36.6
pCO2, Ven: 47 mmHg (ref 44–60)
pH, Ven: 7.34 (ref 7.25–7.43)
pO2, Ven: <31 mmHg — CL (ref 32–45)

## 2022-02-26 LAB — TROPONIN I (HIGH SENSITIVITY)
Troponin I (High Sensitivity): 57 ng/L — ABNORMAL HIGH (ref <18)
Troponin I (High Sensitivity): 1387 ng/L (ref <18)

## 2022-02-26 LAB — BRAIN NATRIURETIC PEPTIDE: B Natriuretic Peptide: 428.6 pg/mL — ABNORMAL HIGH (ref 0.0–100.0)

## 2022-02-26 LAB — MRSA NEXT GEN BY PCR, NASAL: MRSA by PCR Next Gen: NOT DETECTED

## 2022-02-26 LAB — RESP PANEL BY RT-PCR (FLU A&B, COVID) ARPGX2
Influenza A by PCR: NEGATIVE
Influenza B by PCR: NEGATIVE
SARS Coronavirus 2 by RT PCR: NEGATIVE

## 2022-02-26 LAB — LACTIC ACID, PLASMA
Lactic Acid, Venous: 3.6 mmol/L (ref 0.5–1.9)
Lactic Acid, Venous: 3.4 mmol/L (ref 0.5–1.9)
Lactic Acid, Venous: 3.8 mmol/L (ref 0.5–1.9)
Lactic Acid, Venous: 2.3 mmol/L (ref 0.5–1.9)

## 2022-02-26 LAB — PROTIME-INR
INR: 1.1 (ref 0.8–1.2)
Prothrombin Time: 13.7 seconds (ref 11.4–15.2)

## 2022-02-26 MED ORDER — INSULIN ASPART 100 UNIT/ML IJ SOLN: 0.0000 [IU] | Freq: Three times a day (TID) | INTRAMUSCULAR | Status: DC

## 2022-02-26 MED ORDER — METRONIDAZOLE 500 MG/100ML IV SOLN
500.0000 mg | Freq: Once | INTRAVENOUS | Status: AC
  Administered 2022-02-26: 500 mg via INTRAVENOUS
  Filled 2022-02-26: qty 100

## 2022-02-26 MED ORDER — DEXTROSE 50 % IV SOLN: 25.0000 g | INTRAVENOUS | Status: AC

## 2022-02-26 MED ORDER — ACETAMINOPHEN 650 MG RE SUPP: 650.0000 mg | Freq: Four times a day (QID) | RECTAL | Status: DC | PRN

## 2022-02-26 MED ORDER — LACTATED RINGERS IV BOLUS (SEPSIS)
2000.0000 mL | Freq: Once | INTRAVENOUS | Status: AC
  Administered 2022-02-26: 2000 mL via INTRAVENOUS

## 2022-02-26 MED ORDER — PANTOPRAZOLE SODIUM 40 MG PO TBEC
40.0000 mg | DELAYED_RELEASE_TABLET | Freq: Every day | ORAL | Status: DC
  Administered 2022-02-27 – 2022-03-05 (×7): 40 mg via ORAL
  Filled 2022-02-26 (×7): qty 1

## 2022-02-26 MED ORDER — GUAIFENESIN ER 600 MG PO TB12: 600.0000 mg | ORAL_TABLET | Freq: Two times a day (BID) | ORAL | Status: DC

## 2022-02-26 MED ORDER — INSULIN ASPART 100 UNIT/ML IJ SOLN: 0.0000 [IU] | Freq: Every day | INTRAMUSCULAR | Status: DC

## 2022-02-26 MED ORDER — DEXTROSE 50 % IV SOLN
INTRAVENOUS | Status: AC
  Filled 2022-02-26: qty 50

## 2022-02-26 MED ORDER — VANCOMYCIN HCL IN DEXTROSE 1-5 GM/200ML-% IV SOLN: 1000.0000 mg | INTRAVENOUS | Status: DC

## 2022-02-26 MED ORDER — ACETAMINOPHEN 500 MG PO TABS
1000.0000 mg | ORAL_TABLET | Freq: Once | ORAL | Status: AC
  Administered 2022-02-26: 1000 mg via ORAL
  Filled 2022-02-26: qty 2

## 2022-02-26 MED ORDER — ORAL CARE MOUTH RINSE
15.0000 mL | OROMUCOSAL | Status: DC
  Administered 2022-02-26 – 2022-03-05 (×21): 15 mL via OROMUCOSAL

## 2022-02-26 MED ORDER — SODIUM CHLORIDE 0.9 % IV SOLN: 2.0000 g | INTRAVENOUS | Status: DC

## 2022-02-26 MED ORDER — ALBUTEROL SULFATE (2.5 MG/3ML) 0.083% IN NEBU: 2.5000 mg | INHALATION_SOLUTION | RESPIRATORY_TRACT | Status: DC | PRN

## 2022-02-26 MED ORDER — DEXTROSE 50 % IV SOLN
25.0000 g | INTRAVENOUS | Status: AC
  Administered 2022-02-26: 25 g via INTRAVENOUS

## 2022-02-26 MED ORDER — SODIUM CHLORIDE 0.9 % IV BOLUS
500.0000 mL | Freq: Once | INTRAVENOUS | Status: AC
  Administered 2022-02-26: 500 mL via INTRAVENOUS

## 2022-02-26 MED ORDER — VANCOMYCIN HCL IN DEXTROSE 1-5 GM/200ML-% IV SOLN
1000.0000 mg | Freq: Once | INTRAVENOUS | Status: AC
  Administered 2022-02-26: 1000 mg via INTRAVENOUS
  Filled 2022-02-26: qty 200

## 2022-02-26 MED ORDER — HEPARIN (PORCINE) 25000 UT/250ML-% IV SOLN
1050.0000 [IU]/h | INTRAVENOUS | Status: AC
  Administered 2022-02-26: 750 [IU]/h via INTRAVENOUS
  Administered 2022-02-27: 950 [IU]/h via INTRAVENOUS
  Filled 2022-02-26 (×2): qty 250

## 2022-02-26 MED ORDER — LACTATED RINGERS IV SOLN: INTRAVENOUS | Status: DC

## 2022-02-26 MED ORDER — ENOXAPARIN SODIUM 40 MG/0.4ML IJ SOSY: 40.0000 mg | PREFILLED_SYRINGE | INTRAMUSCULAR | Status: DC

## 2022-02-26 MED ORDER — DEXTROSE IN LACTATED RINGERS 5 % IV SOLN: INTRAVENOUS | Status: DC

## 2022-02-26 MED ORDER — SODIUM CHLORIDE (PF) 0.9 % IJ SOLN
INTRAMUSCULAR | Status: AC
  Filled 2022-02-26: qty 50

## 2022-02-26 MED ORDER — DEXTROSE 50 % IV SOLN
INTRAVENOUS | Status: AC
  Administered 2022-02-26: 50 mL
  Filled 2022-02-26: qty 50

## 2022-02-26 MED ORDER — METRONIDAZOLE 500 MG/100ML IV SOLN
500.0000 mg | Freq: Two times a day (BID) | INTRAVENOUS | Status: DC
  Administered 2022-02-26 – 2022-03-01 (×7): 500 mg via INTRAVENOUS
  Filled 2022-02-26 (×8): qty 100

## 2022-02-26 MED ORDER — ACETAMINOPHEN 325 MG PO TABS: 650.0000 mg | ORAL_TABLET | Freq: Four times a day (QID) | ORAL | Status: DC | PRN

## 2022-02-26 MED ORDER — ORAL CARE MOUTH RINSE: 15.0000 mL | OROMUCOSAL | Status: DC | PRN

## 2022-02-26 MED ORDER — SODIUM CHLORIDE 0.9 % IV SOLN
2.0000 g | Freq: Once | INTRAVENOUS | Status: AC
  Administered 2022-02-26: 2 g via INTRAVENOUS
  Filled 2022-02-26: qty 12.5

## 2022-02-26 MED ORDER — IOHEXOL 350 MG/ML SOLN
100.0000 mL | Freq: Once | INTRAVENOUS | Status: AC | PRN
  Administered 2022-02-26: 75 mL via INTRAVENOUS

## 2022-02-26 MED ORDER — VANCOMYCIN HCL 750 MG/150ML IV SOLN
750.0000 mg | INTRAVENOUS | Status: DC
  Filled 2022-02-26: qty 150

## 2022-02-26 MED ORDER — HEPARIN BOLUS VIA INFUSION
3500.0000 [IU] | Freq: Once | INTRAVENOUS | Status: AC
  Administered 2022-02-26: 3500 [IU] via INTRAVENOUS
  Filled 2022-02-26: qty 3500

## 2022-02-26 MED ORDER — CHLORHEXIDINE GLUCONATE CLOTH 2 % EX PADS
6.0000 | MEDICATED_PAD | Freq: Every day | CUTANEOUS | Status: DC
  Administered 2022-02-27 – 2022-03-05 (×6): 6 via TOPICAL

## 2022-02-26 MED ORDER — PERFLUTREN LIPID MICROSPHERE
1.0000 mL | INTRAVENOUS | Status: AC | PRN
  Administered 2022-02-26: 2 mL via INTRAVENOUS

## 2022-02-26 NOTE — Progress Notes (Signed)
Hypoglycemic Event  At 1617 CBG: 53  Treatment: D50 50 mL (25 gm)  Symptoms: None  Follow-up CBG: Time:1658 CBG Result:117  Possible Reasons for Event: Inadequate meal intake  Comments/MD notified: Tyrone Kyle,MD- see new orders    Laurian Brim

## 2022-02-26 NOTE — Progress Notes (Signed)
RT NOTE:  Pt transported to and from CT via BiPAP with no complications noted.

## 2022-02-26 NOTE — Consult Note (Addendum)
Cardiology Consultation   Patient ID: Nathan Millet [name] MRN: [id number]; DOB: May 11, 1924  Admit date: 02/26/2022 Date of Consult: 02/26/2022  PCP:  Sunday Spillers, NP   [facility] Providers Cardiologist:  Lewayne Bunting, MD  Electrophysiologist:  Lewayne Bunting, MD       Patient Profile:   Nathan Merced [name] is a 88 y.o. male with a hx of permanent pacemaker, decreased ejection fraction in 2022 in the setting of flu/pneumonia who is being seen 02/26/2022 for the evaluation of elevated troponin 1000 range at the request of Dr. Ronaldo Miyamoto.  History of Present Illness:   Nathan Alvarez is a 88 year old male patient of Dr. Lubertha Basque with permanent pacemaker [employer] with remote history of atrial fibrillation none recent recorded, ejection fraction decreased in 2022 to 40% down from 65% in the setting of flu pneumonia here with pneumonia once again and elevated troponin in the 1000 range.  He presented with shortness of breath cough which has been present for over a week.  He was trialed on antibiotics as an outpatient but had not gotten much better.  EMS brought him in.  COVID is negative.  He is getting vancomycin and cefepime.  Possibility of aspiration as well.  Currently he is on BiPAP, getting echocardiogram performed.  Ejection fraction does appear decreased.  Echo technician is about to use Definity contrast to assist with visualization.  He denies any chest pain currently.  Past Medical History:  Diagnosis Date   A-fib (HCC)    Dizziness    DM (diabetes mellitus) (HCC)    Esophagitis, reflux    Heart block    Hyperlipidemia    Hypertension, essential, benign    Memory deficit    Pathological fracture of left hip due to age-related osteoporosis (HCC) 04/25/2019   SOB (shortness of breath)    SSS (sick sinus syndrome) (HCC)     Past Surgical History:  Procedure Laterality Date   CATARACT EXTRACTION Right    HIP ARTHROPLASTY Left 04/22/2019   Procedure:  ARTHROPLASTY BIPOLAR HIP (HEMIARTHROPLASTY);  Surgeon: Myrene Galas, MD;  Location: [facility] OR;  Service: Orthopedics;  Laterality: Left;   INGUINAL HERNIA REPAIR     INSERT / REPLACE / REMOVE PACEMAKER     PACEMAKER INSERTION     PPM GENERATOR CHANGEOUT N/A 04/15/2021   Procedure: PPM GENERATOR CHANGEOUT;  Surgeon: Marinus Maw, MD;  Location: [facility] INVASIVE CV LAB;  Service: Cardiovascular;  Laterality: N/A;   TONSILLECTOMY AND ADENOIDECTOMY       Home Medications:  Prior to Admission medications   Medication Sig Start Date End Date Taking? Authorizing Provider  acetaminophen (TYLENOL) 325 MG tablet Take 2 tablets (650 mg total) by mouth every 6 (six) hours as needed for mild pain (or Fever >/= 101). Patient taking differently: Take 650 mg by mouth every 6 (six) hours as needed for mild pain. 12/16/20  Yes Shon Hale, MD  aspirin EC 81 MG EC tablet Take 1 tablet (81 mg total) by mouth daily with breakfast. Swallow whole. 12/16/20  Yes Shon Hale, MD  Calcium Carbonate 500 MG CHEW Chew 500 mg by mouth in the morning and at bedtime.   Yes [provider]  carvedilol (COREG) 3.125 MG tablet Take 3.125 mg by mouth 2 (two) times daily with a meal.   Yes [provider]  Cholecalciferol (VITAMIN D3) 50 MCG (2000 UT) TABS Take 2,000 Units by mouth daily.   Yes [provider]  Control Gel Formula Dressing (DUODERM  CGF DRESSING EX) Apply 1 Application topically once a week. Apply to sacrum wound. May also change as needed when dressing is soiled.   Yes [provider]  ferrous sulfate (FEROSUL) 325 (65 FE) MG tablet Take 325 mg by mouth daily.   Yes [provider]  insulin detemir (LEVEMIR FLEXTOUCH) 100 UNIT/ML FlexPen Inject 25 Units into the skin in the morning.   Yes [provider]  insulin lispro (HUMALOG KWIKPEN) 100 UNIT/ML KwikPen Inject 5 Units into the skin 2 (two) times daily as needed (for BS >450).   Yes [provider]   losartan (COZAAR) 25 MG tablet Take 1 tablet (25 mg total) by mouth daily. 03/23/21  Yes Hongalgi, Lenis Dickinson, MD  metFORMIN (GLUCOPHAGE) 500 MG tablet Take 500 mg by mouth in the morning and at bedtime.   Yes [provider]  nystatin ointment (MYCOSTATIN) Apply 1 Application topically 2 (two) times daily. To groin area   Yes [provider]  PARoxetine (PAXIL) 10 MG tablet Take 10 mg by mouth daily. 03/04/21  Yes [provider]  Skin Protectants, Misc. (BAZA PROTECT EX) Apply 1 application  topically See admin instructions. Apply to affected areas of buttocks, sacrum, and genitals twice daily. May also apply as needed for each incontinence episode   Yes [provider]  UNABLE TO FIND Take 1 Tube by mouth as needed (BS less than 60 or signs of hypoglycemia). Med Name: glucose gel   Yes [provider]  vitamin B-12 (CYANOCOBALAMIN) 1000 MCG tablet Take 2,000 mcg by mouth daily.   Yes [provider]  calcium carbonate (OS-CAL - DOSED IN MG OF ELEMENTAL CALCIUM) 1250 (500 Ca) MG tablet Take 1 tablet (500 mg of elemental calcium total) by mouth 2 (two) times daily with a meal. Patient not taking: Reported on 02/26/2022 12/16/20   Roxan Hockey, MD  carvedilol (COREG) 6.25 MG tablet Take 1 tablet (6.25 mg total) by mouth 2 (two) times daily. For BP and Heart Patient not taking: Reported on 02/26/2022 12/16/20   Roxan Hockey, MD  doxycycline (VIBRAMYCIN) 100 MG capsule Take 1 capsule (100 mg total) by mouth 2 (two) times daily. One po bid x 7 days Patient not taking: Reported on 02/26/2022 12/20/21   Ripley Fraise, MD  JANUVIA 50 MG tablet Take 1 tablet (50 mg total) by mouth daily. Patient not taking: Reported on 07/22/2021 12/16/20   Roxan Hockey, MD  Nystatin (GERHARDT'S BUTT CREAM) CREA Apply 1 application topically 2 (two) times daily. Each shift and prn incontinence. Patient not taking: Reported on 07/22/2021 03/22/21   Modena Jansky, MD    Inpatient Medications: Scheduled Meds:  enoxaparin (LOVENOX) injection  40 mg Subcutaneous Q24H   guaiFENesin  600 mg Oral BID   insulin aspart  0-5 Units Subcutaneous QHS   insulin aspart  0-9 Units Subcutaneous TID WC   [START ON 02/27/2022] pantoprazole  40 mg Oral Daily   Continuous Infusions:  [START ON 02/27/2022] ceFEPime (MAXIPIME) IV     lactated ringers 150 mL/hr at 02/26/22 1351   metronidazole     [START ON 02/27/2022] vancomycin     PRN Meds: acetaminophen **OR** acetaminophen, albuterol  Allergies:    Allergies  Allergen Reactions   Penicillins Other (See Comments)    Reaction is not listed on MAR    Social History:   Social History   Socioeconomic History   Marital status: Widowed    Spouse name: Not on file  Number of children: Not on file   Years of education: Not on file   Highest education level: Not on file  Occupational History   Not on file  Tobacco Use   Smoking status: Former    Types: Cigarettes    Quit date: 01/01/1956    Years since quitting: 66.2    Passive exposure: Never   Smokeless tobacco: Never  Vaping Use   Vaping Use: Never used  Substance and Sexual Activity   Alcohol use: No   Drug use: No   Sexual activity: Not Currently    Comment: MARRIED  Other Topics Concern   Not on file  Social History Narrative   Not on file   Social Determinants of Health   Financial Resource Strain: Not on file  Food Insecurity: Not on file  Transportation Needs: Not on file  Physical Activity: Not on file  Stress: Not on file  Social Connections: Not on file  Intimate Partner Violence: Not on file    Family History:    Family History  Problem Relation Age of Onset   Heart disease Mother      ROS:  Please see the history of present illness.   All other ROS reviewed and negative.     Physical Exam/Data:   Vitals:   02/26/22 0954 02/26/22 1000 02/26/22 1105 02/26/22 1300  BP:  (!) 125/50  (!) 83/62  Pulse: 96 93  87 83  Resp: (!) 41 (!) 38 (!) 28 (!) 33  Temp:      TempSrc:      SpO2: 100% 100% 100% 99%  Weight:      Height:        Intake/Output Summary (Last 24 hours) at 02/26/2022 1453 Last data filed at 02/26/2022 1038 Gross per 24 hour  Intake 397.88 ml  Output --  Net 397.88 ml      02/26/2022    9:19 AM 07/22/2021    3:06 PM 04/15/2021   11:59 AM  Last 3 Weights  Weight (lbs) 127 lb 13.9 oz 127 lb 136 lb  Weight (kg) 58 kg 57.607 kg 61.689 kg     Body mass index is 20.03 kg/m.  General:  Well nourished, well developed, in no acute distress, elderly HEENT: normal Neck: no JVD Vascular: No carotid bruits; Distal pulses 2+ bilaterally Cardiac:  normal S1, S2; RRR; no murmur  Lungs: Coarse breath sounds bilaterally on BiPAP Abd: soft, nontender, no hepatomegaly  Ext: no edema Musculoskeletal:  No deformities, BUE and BLE strength normal and equal Skin: warm and dry  Neuro:  CNs 2-12 intact, no focal abnormalities noted Psych:  Normal affect   EKG:  The EKG was personally reviewed and demonstrates: Sinus tachycardia with PVCs Telemetry:  Telemetry was personally reviewed and demonstrates: No adverse arrhythmias  Relevant CV Studies:  Echocardiogram 03/20/2021   1. Left ventricular ejection fraction, by estimation, is 35 to 40%. The  left ventricle has moderately decreased function. The left ventricle  demonstrates regional wall motion abnormalities (lateral hypokinesis).  There is moderate concentric left  ventricular hypertrophy. Left ventricular diastolic parameters are  indeterminate.   2. Right ventricular systolic function is normal. The right ventricular  size is normal.   3. Left atrial size was moderately dilated.   4. A small pericardial effusion is present. The pericardial effusion is  posterior to the left ventricle.   5. The mitral valve is degenerative. Moderate mitral valve regurgitation.  No evidence of mitral stenosis.   6.  The aortic valve is  tricuspid. Aortic valve regurgitation is trivial.  Aortic valve sclerosis is present, with no evidence of aortic valve  stenosis.   Prior EF 65%  Laboratory Data:  High Sensitivity Troponin:   Recent Labs  Lab 02/26/22 0904 02/26/22 1350  TROPONINIHS 57* 1,387*     Chemistry Recent Labs  Lab 02/26/22 0904  NA 137  K 4.7  CL 105  CO2 23  GLUCOSE 228*  BUN 49*  CREATININE 1.04  CALCIUM 9.0  MG 2.1  GFRNONAA >60  ANIONGAP 9    Recent Labs  Lab 02/26/22 0904  PROT 8.1  ALBUMIN 4.0  AST 19  ALT 12  ALKPHOS 63  BILITOT 0.4   Lipids No results for input(s): "CHOL", "TRIG", "HDL", "LABVLDL", "LDLCALC", "CHOLHDL" in the last 168 hours.  Hematology Recent Labs  Lab 02/26/22 0904  WBC 21.2*  RBC 4.22  HGB 12.7*  HCT 39.9  MCV 94.5  MCH 30.1  MCHC 31.8  RDW 14.6  PLT 323   Thyroid No results for input(s): "TSH", "FREET4" in the last 168 hours.  BNP Recent Labs  Lab 02/26/22 0904  BNP 428.6*    DDimer No results for input(s): "DDIMER" in the last 168 hours.   Radiology/Studies:  CT Angio Chest PE W and/or Wo Contrast  Result Date: 02/26/2022 CLINICAL DATA:  Sepsis. Shortness of breath. Recent antibiotic therapy for pneumonia. Hypoxia. EXAM: CT ANGIOGRAPHY CHEST CT ABDOMEN AND PELVIS WITH CONTRAST TECHNIQUE: Multidetector CT imaging of the chest was performed using the standard protocol during bolus administration of intravenous contrast. Multiplanar CT image reconstructions and MIPs were obtained to evaluate the vascular anatomy. Multidetector CT imaging of the abdomen and pelvis was performed using the standard protocol during bolus administration of intravenous contrast. RADIATION DOSE REDUCTION: This exam was performed according to the departmental dose-optimization program which includes automated exposure control, adjustment of the mA and/or kV according to patient size and/or use of iterative reconstruction technique. CONTRAST:  93mL OMNIPAQUE IOHEXOL  350 MG/ML SOLN COMPARISON:  Chest radiograph 02/26/2022 and CT abdomen 12/14/2020 FINDINGS: Despite efforts by the technologist and patient, motion artifact is present on today's exam and could not be eliminated. This reduces exam sensitivity and specificity. CTA CHEST FINDINGS Cardiovascular: No filling defect is identified in the pulmonary arterial tree to suggest pulmonary embolus. Reduced sensitivity due to motion artifact. Coronary, aortic arch, and branch vessel atherosclerotic vascular disease. Mild cardiomegaly. Dual lead pacer noted. Mediastinum/Nodes: 1.7 cm hypodense nodule in the right thyroid lobe on image 17 series 2. In the setting of significant comorbidities or limited life expectancy, no follow-up recommended (ref: J Am Coll Radiol. 2015 Feb;12(2): 143-50).No pathologic adenopathy. Lungs/Pleura: Airspace opacities in both lower lobes suspicious for multilobar pneumonia or aspiration pneumonitis. There is some faint airspace opacity peripherally in the right upper lobe on image 47 series 4. Musculoskeletal: Degenerative sternoclavicular arthropathy with some posterior subluxation of the right clavicle along the sternoclavicular joint as shown on image 37 series 4. Thoracic spondylosis. Review of the MIP images confirms the above findings. CT ABDOMEN and PELVIS FINDINGS Hepatobiliary: Unremarkable Pancreas: Atrophic pancreas with small scattered cystic lesions or cystic dilatation of the dorsal pancreatic duct. The largest such lesion is in the pancreatic head and measures 1.2 by 1.2 cm on image 34 series 4, stable based on my measurements compared to 12/14/2020. No progression or change since the prior exam. Spleen: Unremarkable Adrenals/Urinary Tract: Both adrenal glands appear normal. Bosniak category 1 cyst of the left  kidney upper pole is benign and warrants no further workup. Urinary bladder and left distal ureter partially obscured by streak artifact from the left hip implant. Stomach/Bowel:  Distended rectum and sigmoid colon, with mild associated wall thickening potentially reflecting distal colitis. The degree of wall thickening is less striking than on the 12/14/2020 exam although the distal colon is substantially more distended with frothy material down on that exam. Along the proximal margin of the dilated segment the proximal sigmoid colon demonstrates some twisting/swirling as on images 33 through 42 of series 6, along the margin of a diverticulum, but without luminal occlusion/obstruction or other significant indicator of a fulcrum for volvulus. Accordingly I favor a functional abnormality and/or distal colitis over sigmoid volvulus. 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 There is also mild wall calcification in the proximal SMA. No pathologic adenopathy. Reproductive: Prostate gland partially obscured by streak artifact from the patient's left hip implant. Other: No substantial ascites.  Trace perirectal stranding. Musculoskeletal: Left hip hemiarthroplasty. Interdigitating articulation and likely partial fusion at L4-5 and likely partial fusion at L5-S1. Left foraminal impingement at L4-5 due to intervertebral and facet spurring. Probably fused facet joints at L4-5. Mild left foraminal stenosis at L3-4 due to facet arthropathy. Review of the MIP images confirms the above findings. IMPRESSION: 1. No filling defect is identified in the pulmonary arterial tree to suggest pulmonary embolus. Reduced sensitivity due to motion artifact. 2. Airspace opacities in both lower lobes suspicious for multilobar pneumonia or aspiration pneumonitis. There is also some faint airspace opacity peripherally in the right upper lobe. 3. Distended rectum and sigmoid colon, with mild associated wall thickening potentially reflecting distal colitis. The degree of wall thickening is less striking than on the 12/14/2020 exam. There is some twisting/swirling along the  proximal margin of the dilated segment of the proximal sigmoid colon, but without luminal occlusion/obstruction or other significant indicator of a fulcrum for volvulus. Accordingly I favor a functional abnormality and/or distal colitis over sigmoid volvulus. 4. Atrophic pancreas with small scattered cystic lesions or cystic dilatation of the dorsal pancreatic duct. The largest such lesion is in the pancreatic head and measures 1.2 by 1.2 cm. No progression or change since the prior exam. Given the patient's age, follow up pancreatic protocol CT or MRI is recommended in 2 years time. 5. 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 Left foraminal impingement at L4-5 due to intervertebral and facet spurring. 6. Aortic and coronary atherosclerosis. Aortic Atherosclerosis (ICD10-I70.0). Electronically Signed   By: Van Clines M.D.   On: 02/26/2022 11:38   CT ABDOMEN PELVIS W CONTRAST  Result Date: 02/26/2022 CLINICAL DATA:  Sepsis. Shortness of breath. Recent antibiotic therapy for pneumonia. Hypoxia. EXAM: CT ANGIOGRAPHY CHEST CT ABDOMEN AND PELVIS WITH CONTRAST TECHNIQUE: Multidetector CT imaging of the chest was performed using the standard protocol during bolus administration of intravenous contrast. Multiplanar CT image reconstructions and MIPs were obtained to evaluate the vascular anatomy. Multidetector CT imaging of the abdomen and pelvis was performed using the standard protocol during bolus administration of intravenous contrast. RADIATION DOSE REDUCTION: This exam was performed according to the departmental dose-optimization program which includes automated exposure control, adjustment of the mA and/or kV according to patient size and/or use of iterative reconstruction technique. CONTRAST:  35mL OMNIPAQUE IOHEXOL 350 MG/ML SOLN COMPARISON:  Chest radiograph 02/26/2022 and CT abdomen 12/14/2020 FINDINGS: Despite efforts by the technologist and patient, motion artifact is  present on today's exam and could not be  eliminated. This reduces exam sensitivity and specificity. CTA CHEST FINDINGS Cardiovascular: No filling defect is identified in the pulmonary arterial tree to suggest pulmonary embolus. Reduced sensitivity due to motion artifact. Coronary, aortic arch, and branch vessel atherosclerotic vascular disease. Mild cardiomegaly. Dual lead pacer noted. Mediastinum/Nodes: 1.7 cm hypodense nodule in the right thyroid lobe on image 17 series 2. In the setting of significant comorbidities or limited life expectancy, no follow-up recommended (ref: J Am Coll Radiol. 2015 Feb;12(2): 143-50).No pathologic adenopathy. Lungs/Pleura: Airspace opacities in both lower lobes suspicious for multilobar pneumonia or aspiration pneumonitis. There is some faint airspace opacity peripherally in the right upper lobe on image 47 series 4. Musculoskeletal: Degenerative sternoclavicular arthropathy with some posterior subluxation of the right clavicle along the sternoclavicular joint as shown on image 37 series 4. Thoracic spondylosis. Review of the MIP images confirms the above findings. CT ABDOMEN and PELVIS FINDINGS Hepatobiliary: Unremarkable Pancreas: Atrophic pancreas with small scattered cystic lesions or cystic dilatation of the dorsal pancreatic duct. The largest such lesion is in the pancreatic head and measures 1.2 by 1.2 cm on image 34 series 4, stable based on my measurements compared to 12/14/2020. No progression or change since the prior exam. Spleen: Unremarkable Adrenals/Urinary Tract: Both adrenal glands appear normal. Bosniak category 1 cyst of the left kidney upper pole is benign and warrants no further workup. Urinary bladder and left distal ureter partially obscured by streak artifact from the left hip implant. Stomach/Bowel: Distended rectum and sigmoid colon, with mild associated wall thickening potentially reflecting distal colitis. The degree of wall thickening is less striking  than on the 12/14/2020 exam although the distal colon is substantially more distended with frothy material down on that exam. Along the proximal margin of the dilated segment the proximal sigmoid colon demonstrates some twisting/swirling as on images 33 through 42 of series 6, along the margin of a diverticulum, but without luminal occlusion/obstruction or other significant indicator of a fulcrum for volvulus. Accordingly I favor a functional abnormality and/or distal colitis over sigmoid volvulus. 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 There is also mild wall calcification in the proximal SMA. No pathologic adenopathy. Reproductive: Prostate gland partially obscured by streak artifact from the patient's left hip implant. Other: No substantial ascites.  Trace perirectal stranding. Musculoskeletal: Left hip hemiarthroplasty. Interdigitating articulation and likely partial fusion at L4-5 and likely partial fusion at L5-S1. Left foraminal impingement at L4-5 due to intervertebral and facet spurring. Probably fused facet joints at L4-5. Mild left foraminal stenosis at L3-4 due to facet arthropathy. Review of the MIP images confirms the above findings. IMPRESSION: 1. No filling defect is identified in the pulmonary arterial tree to suggest pulmonary embolus. Reduced sensitivity due to motion artifact. 2. Airspace opacities in both lower lobes suspicious for multilobar pneumonia or aspiration pneumonitis. There is also some faint airspace opacity peripherally in the right upper lobe. 3. Distended rectum and sigmoid colon, with mild associated wall thickening potentially reflecting distal colitis. The degree of wall thickening is less striking than on the 12/14/2020 exam. There is some twisting/swirling along the proximal margin of the dilated segment of the proximal sigmoid colon, but without luminal occlusion/obstruction or other significant indicator of a fulcrum for  volvulus. Accordingly I favor a functional abnormality and/or distal colitis over sigmoid volvulus. 4. Atrophic pancreas with small scattered cystic lesions or cystic dilatation of the dorsal pancreatic duct. The largest such lesion is in the pancreatic head and measures 1.2 by 1.2 cm. No  progression or change since the prior exam. Given the patient's age, follow up pancreatic protocol CT or MRI is recommended in 2 years time. 5. 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 Left foraminal impingement at L4-5 due to intervertebral and facet spurring. 6. Aortic and coronary atherosclerosis. Aortic Atherosclerosis (ICD10-I70.0). Electronically Signed   By: Van Clines M.D.   On: 02/26/2022 11:38   DG Chest Port 1 View  Result Date: 02/26/2022 CLINICAL DATA:  Evaluate sepsis EXAM: PORTABLE CHEST 1 VIEW COMPARISON:  None Available. FINDINGS: LEFT-sided pacer overlies normal cardiac silhouette. No effusion, infiltrate or pneumothorax. No acute osseous abnormality. IMPRESSION: No evidence of pulmonary infection. Electronically Signed   By: Suzy Bouchard M.D.   On: 02/26/2022 09:36     Assessment and Plan:   Elevated troponin secondary to type II myocardial infarction which is oxygen supply/demand mismatch in the setting of pneumonia -Continue with supportive care.  Aspirin, beta-blocker, ARB -It is not unreasonable to utilize IV heparin for the next 24-48 hours.  During previous hospitalization he was adamant that he did not want any invasive studies. -Leukocytosis noted at 21,000. -Note that although plaque rupture is not likely, type II myocardial infarction or elevated troponin in this situation portends a worsened overall prognosis and increase risks of morbidity and mortality.  Permanent pacemaker - Prior history of hypertension atrial fibrillation no syncope. - Pacemaker was changed out on 04/15/2021.  Followed by Dr. Lovena Le.  Doing well in DDD mode,  [employer].  Prior atrial fibrillation - No recent recording of atrial fibrillation noted.  LV dysfunction in [id number] systolic heart failure - EF decreased from 65% in 2018 down to 40% in 2022 in the setting of febrile illness, flu A.  He was seen at that time by Dr. Gardiner Rhyme. -We will recheck an echocardiogram during this admission to see if there has been any change in ejection fraction.  He does not appear to be hypervolemic.  If necessary, we can always utilize Lasix if felt to be beneficial.  Given his current septicemia we will avoid for now.   Diabetes mellitus - Sliding scale insulin.  Risk Assessment/Risk Scores:       CRITICAL CARE Performed by: Candee Furbish   Total critical care time: 40 minutes  Critical care time was exclusive of separately billable procedures and treating other patients.  Critical care was necessary to treat or prevent imminent or life-threatening deterioration.  Critical care was time spent personally by me on the following activities: development of treatment plan with patient and/or surrogate as well as nursing, discussions with consultants, evaluation of patient's response to treatment, examination of patient, obtaining history from patient or surrogate, ordering and performing treatments and interventions, ordering and review of laboratory studies, ordering and review of radiographic studies, pulse oximetry and re-evaluation of patient's condition.     For questions or updates, please contact [place] Please consult [email or web address] for contact info under    Signed, Candee Furbish, MD  02/26/2022 2:53 PM

## 2022-02-26 NOTE — ED Triage Notes (Signed)
Pt bib ems from [facility] c/o shortness of breath hx pneumonia just finished antibiotics. 82% room air on ems arrival

## 2022-02-26 NOTE — Progress Notes (Signed)
A consult was received from an ED physician for Vanco, Cefepime per pharmacy dosing.  The patient's profile has been reviewed for ht/wt/allergies/indication/available labs.   A one time order has been placed for Vanco 1g and Cefepime 2g IV x 1  (doses ok).  Further antibiotics/pharmacy consults should be ordered by admitting physician if indicated.                       Ragena Fiola S. Merilynn Finland, PharmD, BCPS Clinical Staff Pharmacist [email or web address] Thank you, Pasty Spillers 02/26/2022  9:23 AM

## 2022-02-26 NOTE — Sepsis Progress Note (Signed)
eLink is following this Code Sepsis. °

## 2022-02-26 NOTE — H&P (Addendum)
History and Physical    Patient: Nathan Reckner [name] [id number] DOB: [birth year] DOA: 02/26/2022 DOS: the patient was seen and examined on 02/26/2022 PCP: Jacelyn Pi, NP  Patient coming from: ALF/ILF  Chief Complaint:  Chief Complaint  Patient presents with   Shortness of Breath   HPI: Nathan Wolden [name] is a 88 y.o. male with medical history significant of DM2, GERD, HTN, HLD, SSS s/p PPM. Presenting with shortness of breath and cough. He reports that he's had shortness of breath for over a week. He was diagnosed with PNA in the outpatient setting and started on an abx. He is unable to tell me which one he had. This morning, he states his coughing got much worse. He didn't have any fever or sick contacts. When his symptoms did not improve, the staff at his ALF called for EMS. He denies any other aggravating or alleviating factors.   Review of Systems: As mentioned in the history of present illness. All other systems reviewed and are negative. Past Medical History:  Diagnosis Date   A-fib ([place])    Dizziness    DM (diabetes mellitus) (HCC)    Esophagitis, reflux    Heart block    Hyperlipidemia    Hypertension, essential, benign    Memory deficit    Pathological fracture of left hip due to age-related osteoporosis ([place]) 04/25/2019   SOB (shortness of breath)    SSS (sick sinus syndrome) ([place])    Past Surgical History:  Procedure Laterality Date   CATARACT EXTRACTION Right    HIP ARTHROPLASTY Left 04/22/2019   Procedure: ARTHROPLASTY BIPOLAR HIP (HEMIARTHROPLASTY);  Surgeon: Altamese Watertown, MD;  Location: [facility];  Service: Orthopedics;  Laterality: Left;   INGUINAL HERNIA REPAIR     INSERT / REPLACE / REMOVE PACEMAKER     PACEMAKER INSERTION     PPM GENERATOR CHANGEOUT N/A 04/15/2021   Procedure: PPM GENERATOR CHANGEOUT;  Surgeon: Evans Lance, MD;  Location: [facility];  Service: Cardiovascular;  Laterality: N/A;   TONSILLECTOMY AND ADENOIDECTOMY     Social  History:  reports that he quit smoking about 66 years ago. His smoking use included cigarettes. He has never been exposed to tobacco smoke. He has never used smokeless tobacco. He reports that he does not drink alcohol and does not use drugs.  Allergies  Allergen Reactions   Penicillins     unknown    Family History  Problem Relation Age of Onset   Heart disease Mother     Prior to Admission medications   Medication Sig Start Date End Date Taking? Authorizing Provider  acetaminophen (TYLENOL) 325 MG tablet Take 2 tablets (650 mg total) by mouth every 6 (six) hours as needed for mild pain (or Fever >/= 101). 12/16/20   Roxan Hockey, MD  aspirin EC 81 MG EC tablet Take 1 tablet (81 mg total) by mouth daily with breakfast. Swallow whole. 12/16/20   Roxan Hockey, MD  calcium carbonate (OS-CAL - DOSED IN MG OF ELEMENTAL CALCIUM) 1250 (500 Ca) MG tablet Take 1 tablet (500 mg of elemental calcium total) by mouth 2 (two) times daily with a meal. 12/16/20   Emokpae, Courage, MD  carvedilol (COREG) 6.25 MG tablet Take 1 tablet (6.25 mg total) by mouth 2 (two) times daily. For BP and Heart 12/16/20   Roxan Hockey, MD  Cholecalciferol (VITAMIN D3 PO) Take 200 Units by mouth daily.    [provider]  doxycycline (VIBRAMYCIN) 100 MG capsule Take  1 capsule (100 mg total) by mouth 2 (two) times daily. One po bid x 7 days 12/20/21   Ripley Fraise, MD  Ferrous Sulfate (IRON) 325 (65 Fe) MG TABS Take 325 mg by mouth daily.    [provider]  JANUVIA 50 MG tablet Take 1 tablet (50 mg total) by mouth daily. Patient not taking: Reported on 07/22/2021 12/16/20   Roxan Hockey, MD  losartan (COZAAR) 25 MG tablet Take 1 tablet (25 mg total) by mouth daily. 03/23/21   Hongalgi, Lenis Dickinson, MD  Nystatin (GERHARDT'S BUTT CREAM) CREA Apply 1 application topically 2 (two) times daily. Each shift and prn incontinence. Patient not taking: Reported on 07/22/2021 03/22/21   Modena Jansky, MD   PARoxetine (PAXIL) 10 MG tablet Take 10 mg by mouth daily. Patient not taking: Reported on 07/22/2021 03/04/21   [provider]  Skin Protectants, Misc. (BAZA PROTECT EX) Apply 1 application topically See admin instructions. Apply to affected areas of buttocks, sacrum, and genitals twice daily, may apply as needed with each incontinence episode    [provider]  vitamin B-12 (CYANOCOBALAMIN) 1000 MCG tablet Take 1,000 mcg by mouth daily.    [provider]    Physical Exam: Vitals:   02/26/22 0919 02/26/22 0954 02/26/22 1000 02/26/22 1105  BP:   (!) 125/50   Pulse:  96 93 87  Resp:  (!) 41 (!) 38 (!) 28  Temp:      TempSrc:      SpO2:  100% 100% 100%  Weight: 58 kg     Height: 5\' 7"  (1.702 m)      General: 88 y.o. male resting in bed in NAD Eyes: PERRL, normal sclera ENMT: Nares patent w/o discharge, orophaynx clear, dentition normal, ears w/o discharge/lesions/ulcers Neck: Supple, trachea midline Cardiovascular: RRR, +S1, S2, no m/g/r, equal pulses throughout Respiratory: course at bases, no wheeze, slightly increased WOB on BiPap GI: BS+, NDNT, no masses noted, no organomegaly noted MSK: No e/c/c; seems a little dry Neuro: A&O x 3, no focal deficits Psyc: Appropriate interaction and affect, calm/cooperative  Data Reviewed:  Results for orders placed or performed during the hospital encounter of 02/26/22 (from the past 24 hour(s))  Lactic acid, plasma     Status: Abnormal   Collection Time: 02/26/22  9:04 AM  Result Value Ref Range   Lactic Acid, Venous 3.6 (HH) 0.5 - 1.9 mmol/L  Comprehensive metabolic panel     Status: Abnormal   Collection Time: 02/26/22  9:04 AM  Result Value Ref Range   Sodium 137 135 - 145 mmol/L   Potassium 4.7 3.5 - 5.1 mmol/L   Chloride 105 98 - 111 mmol/L   CO2 23 22 - 32 mmol/L   Glucose, Bld 228 (H) 70 - 99 mg/dL   BUN 49 (H) 8 - 23 mg/dL   Creatinine, Ser 1.04 0.61 - 1.24 mg/dL   Calcium 9.0 8.9 - 10.3 mg/dL    Total Protein 8.1 6.5 - 8.1 g/dL   Albumin 4.0 3.5 - 5.0 g/dL   AST 19 15 - 41 U/L   ALT 12 0 - 44 U/L   Alkaline Phosphatase 63 38 - 126 U/L   Total Bilirubin 0.4 0.3 - 1.2 mg/dL   GFR, Estimated >60 >60 mL/min   Anion gap 9 5 - 15  CBC with Differential     Status: Abnormal   Collection Time: 02/26/22  9:04 AM  Result Value Ref Range   WBC 21.2 (H)  4.0 - 10.5 K/uL   RBC 4.22 4.22 - 5.81 MIL/uL   Hemoglobin 12.7 (L) 13.0 - 17.0 g/dL   HCT 16.1 09.6 - 04.5 %   MCV 94.5 80.0 - 100.0 fL   MCH 30.1 26.0 - 34.0 pg   MCHC 31.8 30.0 - 36.0 g/dL   RDW 40.9 81.1 - 91.4 %   Platelets 323 150 - 400 K/uL   nRBC 0.0 0.0 - 0.2 %   Neutrophils Relative % 79 %   Neutro Abs 16.8 (H) 1.7 - 7.7 K/uL   Lymphocytes Relative 15 %   Lymphs Abs 3.2 0.7 - 4.0 K/uL   Monocytes Relative 4 %   Monocytes Absolute 0.9 0.1 - 1.0 K/uL   Eosinophils Relative 1 %   Eosinophils Absolute 0.1 0.0 - 0.5 K/uL   Basophils Relative 0 %   Basophils Absolute 0.1 0.0 - 0.1 K/uL   Immature Granulocytes 1 %   Abs Immature Granulocytes 0.17 (H) 0.00 - 0.07 K/uL  Protime-INR     Status: None   Collection Time: 02/26/22  9:04 AM  Result Value Ref Range   Prothrombin Time 13.7 11.4 - 15.2 seconds   INR 1.1 0.8 - 1.2  Brain natriuretic peptide     Status: Abnormal   Collection Time: 02/26/22  9:04 AM  Result Value Ref Range   B Natriuretic Peptide 428.6 (H) 0.0 - 100.0 pg/mL  Troponin I (High Sensitivity)     Status: Abnormal   Collection Time: 02/26/22  9:04 AM  Result Value Ref Range   Troponin I (High Sensitivity) 57 (H) <18 ng/L  Magnesium     Status: None   Collection Time: 02/26/22  9:04 AM  Result Value Ref Range   Magnesium 2.1 1.7 - 2.4 mg/dL  Resp Panel by RT-PCR (Flu A&B, Covid) Anterior Nasal Swab     Status: None   Collection Time: 02/26/22  9:52 AM   Specimen: Anterior Nasal Swab  Result Value Ref Range   SARS Coronavirus 2 by RT PCR NEGATIVE NEGATIVE   Influenza A by PCR NEGATIVE NEGATIVE    Influenza B by PCR NEGATIVE NEGATIVE   CTA Chest CT ab/pelvis w/ contrast 1. No filling defect is identified in the pulmonary arterial tree to suggest pulmonary embolus. Reduced sensitivity due to motion artifact. 2. Airspace opacities in both lower lobes suspicious for multilobar pneumonia or aspiration pneumonitis. There is also some faint airspace opacity peripherally in the right upper lobe. 3. Distended rectum and sigmoid colon, with mild associated wall thickening potentially reflecting distal colitis. The degree of wall thickening is less striking than on the 12/14/2020 exam. There is some twisting/swirling along the proximal margin of the dilated segment of the proximal sigmoid colon, but without luminal occlusion/obstruction or other significant indicator of a fulcrum for volvulus. Accordingly I favor a functional abnormality and/or distal colitis over sigmoid volvulus. 4. Atrophic pancreas with small scattered cystic lesions or cystic dilatation of the dorsal pancreatic duct. The largest such lesion is in the pancreatic head and measures 1.2 by 1.2 cm. No progression or change since the prior exam. Given the patient's age, follow up pancreatic protocol CT or MRI is recommended in 2 years time. 5. 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 Left foraminal impingement at L4-5 due to intervertebral and facet spurring. 6. Aortic and coronary atherosclerosis.  Assessment and Plan: Sepsis secondary to multifocal PNA and sigmoid colitis     - admit to inpt, SDU     -  continue vanc, cefepime     - PRN nebs     - guaifenesin     - wean BiPap as able     - urine legionella, strep, MRSA     - ?aspiration; make NPO except for sips/meds; add SLP eval     - COVID negative     - denies diarrhea     - continue flagyl  DM2     - SSI, A1c, glucose checks, DM diet  HTN SSS s/p PPM     - pressures are soft. Hold home regimen for now  GERD     -  PPI  Elevated troponins     - no chest pain     - EKG non-ischemic     - trend for now UPDATE:     - trp trending up to 1387     - no complaints of CP     - rpt EKG, check echo     - add heparin gtt     - cards consulted, appreciate assistance  Advance Care Planning:   Code Status: Intubation only  Consults: None  Family Communication: w/ son by phone  Severity of Illness: The appropriate patient status for this patient is INPATIENT. Inpatient status is judged to be reasonable and necessary in order to provide the required intensity of service to ensure the patient's safety. The patient's presenting symptoms, physical exam findings, and initial radiographic and laboratory data in the context of their chronic comorbidities is felt to place them at high risk for further clinical deterioration. Furthermore, it is not anticipated that the patient will be medically stable for discharge from the hospital within 2 midnights of admission.   * I certify that at the point of admission it is my clinical judgment that the patient will require inpatient hospital care spanning beyond 2 midnights from the point of admission due to high intensity of service, high risk for further deterioration and high frequency of surveillance required.*  Author: Jonnie Finner, DO 02/26/2022 12:13 PM  For on call review [email or web address].

## 2022-02-26 NOTE — Progress Notes (Signed)
[place] [place] for IV heparin Indication: chest pain/ACS  Allergies  Allergen Reactions   Penicillins Other (See Comments)    Reaction is not listed on [facility]    Patient Measurements: Height: 5\' 7"  (170.2 cm) Weight: 58 kg (127 lb 13.9 oz) IBW/kg (Calculated) : 66.1 Heparin Dosing Weight: TBW  Vital Signs: Temp: 100.7 F (38.2 C) (11/22 0913) Temp Source: Oral (11/22 0913) BP: 106/66 (11/22 1500) Pulse Rate: 72 (11/22 1507)  Labs: Recent Labs    02/26/22 0904 02/26/22 1350  HGB 12.7*  --   HCT 39.9  --   PLT 323  --   LABPROT 13.7  --   INR 1.1  --   CREATININE 1.04  --   TROPONINIHS 57* 1,387*    Estimated Creatinine Clearance: 35.6 mL/min (by C-G formula based on SCr of 1.04 mg/dL).   Medications:  Medications Prior to Admission  Medication Sig Dispense Refill Last Dose   acetaminophen (TYLENOL) 325 MG tablet Take 2 tablets (650 mg total) by mouth every 6 (six) hours as needed for mild pain (or Fever >/= 101). (Patient taking differently: Take 650 mg by mouth every 6 (six) hours as needed for mild pain.) 12 tablet 1 unk   aspirin EC 81 MG EC tablet Take 1 tablet (81 mg total) by mouth daily with breakfast. Swallow whole. 30 tablet 11 02/25/2022   Calcium Carbonate 500 MG CHEW Chew 500 mg by mouth in the morning and at bedtime.   02/25/2022   carvedilol (COREG) 3.125 MG tablet Take 3.125 mg by mouth 2 (two) times daily with a meal.   02/25/2022 at 2000   Cholecalciferol (VITAMIN D3) 50 MCG (2000 UT) TABS Take 2,000 Units by mouth daily.   02/25/2022   Control Gel Formula Dressing (DUODERM CGF DRESSING EX) Apply 1 Application topically once a week. Apply to sacrum wound. May also change as needed when dressing is soiled.   02/21/2022   ferrous sulfate (FEROSUL) 325 (65 FE) MG tablet Take 325 mg by mouth daily.   02/25/2022   insulin detemir (LEVEMIR FLEXTOUCH) 100 UNIT/ML FlexPen Inject 25 Units into the skin in the morning.   02/26/2022    insulin lispro (HUMALOG KWIKPEN) 100 UNIT/ML KwikPen Inject 5 Units into the skin 2 (two) times daily as needed (for BS >450).   unk   losartan (COZAAR) 25 MG tablet Take 1 tablet (25 mg total) by mouth daily. 30 tablet 1 02/25/2022   metFORMIN (GLUCOPHAGE) 500 MG tablet Take 500 mg by mouth in the morning and at bedtime.   02/25/2022   nystatin ointment (MYCOSTATIN) Apply 1 Application topically 2 (two) times daily. To groin area   02/26/2022   PARoxetine (PAXIL) 10 MG tablet Take 10 mg by mouth daily.   02/25/2022   Skin Protectants, Misc. (BAZA PROTECT EX) Apply 1 application  topically See admin instructions. Apply to affected areas of buttocks, sacrum, and genitals twice daily. May also apply as needed for each incontinence episode   02/26/2022   UNABLE TO FIND Take 1 Tube by mouth as needed (BS less than 60 or signs of hypoglycemia). Med Name: glucose gel   02/25/2022   vitamin B-12 (CYANOCOBALAMIN) 1000 MCG tablet Take 2,000 mcg by mouth daily.   02/25/2022   calcium carbonate (OS-CAL - DOSED IN MG OF ELEMENTAL CALCIUM) 1250 (500 Ca) MG tablet Take 1 tablet (500 mg of elemental calcium total) by mouth 2 (two) times daily with a meal. (Patient not taking:  Reported on 02/26/2022) 60 tablet 1 Not Taking   carvedilol (COREG) 6.25 MG tablet Take 1 tablet (6.25 mg total) by mouth 2 (two) times daily. For BP and Heart (Patient not taking: Reported on 02/26/2022) 180 tablet 3 Not Taking   doxycycline (VIBRAMYCIN) 100 MG capsule Take 1 capsule (100 mg total) by mouth 2 (two) times daily. One po bid x 7 days (Patient not taking: Reported on 02/26/2022) 14 capsule 0 Not Taking   JANUVIA 50 MG tablet Take 1 tablet (50 mg total) by mouth daily. (Patient not taking: Reported on 07/22/2021) 30 tablet 1 Not Taking   Nystatin (GERHARDT'S BUTT CREAM) CREA Apply 1 application topically 2 (two) times daily. Each shift and prn incontinence. (Patient not taking: Reported on 07/22/2021) 1 each 0 Not Taking    Scheduled:   guaiFENesin  600 mg Oral BID   heparin  3,500 Units Intravenous Once   insulin aspart  0-5 Units Subcutaneous QHS   insulin aspart  0-9 Units Subcutaneous TID WC   [START ON 02/27/2022] pantoprazole  40 mg Oral Daily   PRN: acetaminophen **OR** acetaminophen, albuterol  Assessment: 45 yoM with PMH DM2, HTN, HLD, SSS w/ PPM, presents 11/22 w/ sepsis d/t multifocal PNA and colitis. Found to have elevated troponins, rising significantly on recheck. Pharmacy consulted to dose IV heparin for ACS.  Baseline INR WNL, aPTT not done Prior anticoagulation: none; ASA only  Significant events:  Today, 02/26/2022: CBC: Hgb slightly low; Plt WNL SCr WNL No bleeding or infusion issues per nursing  Goal of Therapy: Heparin level 0.3-0.7 units/ml Monitor platelets by anticoagulation protocol: Yes  Plan: Heparin 3500 units IV bolus x 1 Heparin 750 units/hr IV infusion Check heparin level 8 hrs after start Daily CBC, daily heparin level once stable Monitor for signs of bleeding or thrombosis  Bernadene Person, PharmD, BCPS [phone] 02/26/2022, 3:17 PM

## 2022-02-26 NOTE — Progress Notes (Signed)
RT NOTE:  Pt transported to ICU via BiPAP machine with no complications noted.

## 2022-02-26 NOTE — ED Provider Notes (Signed)
[facility] DEPT Provider Note   CSN: [id number] Arrival date & time: 02/26/22  0854     History  Chief Complaint  Patient presents with   Shortness of Breath    Nathan Alvarez [name] is a 88 y.o. male.  HPI Patient presents for shortness of breath.  Members includes HTN, diabetes, anemia, sinus node dysfunction s/p pacemaker, BPH, atrial fibrillation.  He arrives via EMS from independent living facility.  EMS noted hypoxia in the low 80s on room air.  He does not wear oxygen at baseline.  Patient is also found to be tachypneic and tachycardic.  He was diagnosed with pneumonia 1 week ago and reportedly did complete 1 week of antibiotics.  He endorses generalized weakness and shortness of breath. He denies any current areas of pain.  He has had ongoing cough.    Home Medications Prior to Admission medications   Medication Sig Start Date End Date Taking? Authorizing Provider  acetaminophen (TYLENOL) 325 MG tablet Take 2 tablets (650 mg total) by mouth every 6 (six) hours as needed for mild pain (or Fever >/= 101). 12/16/20   Roxan Hockey, MD  aspirin EC 81 MG EC tablet Take 1 tablet (81 mg total) by mouth daily with breakfast. Swallow whole. 12/16/20   Roxan Hockey, MD  calcium carbonate (OS-CAL - DOSED IN MG OF ELEMENTAL CALCIUM) 1250 (500 Ca) MG tablet Take 1 tablet (500 mg of elemental calcium total) by mouth 2 (two) times daily with a meal. 12/16/20   Emokpae, Courage, MD  carvedilol (COREG) 6.25 MG tablet Take 1 tablet (6.25 mg total) by mouth 2 (two) times daily. For BP and Heart 12/16/20   Roxan Hockey, MD  Cholecalciferol (VITAMIN D3 PO) Take 200 Units by mouth daily.    [provider]  doxycycline (VIBRAMYCIN) 100 MG capsule Take 1 capsule (100 mg total) by mouth 2 (two) times daily. One po bid x 7 days 12/20/21   Ripley Fraise, MD  Ferrous Sulfate (IRON) 325 (65 Fe) MG TABS Take 325 mg by mouth daily.    [provider]   JANUVIA 50 MG tablet Take 1 tablet (50 mg total) by mouth daily. Patient not taking: Reported on 07/22/2021 12/16/20   Roxan Hockey, MD  losartan (COZAAR) 25 MG tablet Take 1 tablet (25 mg total) by mouth daily. 03/23/21   Hongalgi, Lenis Dickinson, MD  Nystatin (GERHARDT'S BUTT CREAM) CREA Apply 1 application topically 2 (two) times daily. Each shift and prn incontinence. Patient not taking: Reported on 07/22/2021 03/22/21   Modena Jansky, MD  PARoxetine (PAXIL) 10 MG tablet Take 10 mg by mouth daily. Patient not taking: Reported on 07/22/2021 03/04/21   [provider]  Skin Protectants, Misc. (BAZA PROTECT EX) Apply 1 application topically See admin instructions. Apply to affected areas of buttocks, sacrum, and genitals twice daily, may apply as needed with each incontinence episode    [provider]  vitamin B-12 (CYANOCOBALAMIN) 1000 MCG tablet Take 1,000 mcg by mouth daily.    [provider]      Allergies    Penicillins    Review of Systems   Review of Systems  Constitutional:  Positive for chills and fatigue.  Respiratory:  Positive for cough and shortness of breath.   Neurological:  Positive for weakness (Generalized).  All other systems reviewed and are negative.   Physical Exam Updated Vital Signs BP (!) 125/50   Pulse 87   Temp (!) 100.7 F (38.2 C) (  Oral)   Resp (!) 28   Ht 5\' 7"  (1.702 m)   Wt 58 kg   SpO2 100%   BMI 20.03 kg/m  Physical Exam Vitals and nursing note reviewed.  Constitutional:      General: He is not in acute distress.    Appearance: He is well-developed. He is ill-appearing. He is not toxic-appearing or diaphoretic.  HENT:     Head: Normocephalic and atraumatic.     Right Ear: External ear normal.     Left Ear: External ear normal.     Nose: Nose normal.     Mouth/Throat:     Mouth: Mucous membranes are dry.     Pharynx: Oropharynx is clear.  Eyes:     Extraocular Movements: Extraocular movements intact.      Conjunctiva/sclera: Conjunctivae normal.  Cardiovascular:     Rate and Rhythm: Regular rhythm. Tachycardia present.     Heart sounds: No murmur heard. Pulmonary:     Effort: Tachypnea and accessory muscle usage present. No respiratory distress.     Breath sounds: Transmitted upper airway sounds present. No stridor. Rhonchi present. No wheezing or rales.  Abdominal:     General: There is no distension.     Palpations: Abdomen is soft.     Tenderness: There is no abdominal tenderness.  Musculoskeletal:        General: No swelling. Normal range of motion.     Cervical back: Normal range of motion and neck supple.     Right lower leg: No edema.     Left lower leg: No edema.  Skin:    General: Skin is warm and dry.     Coloration: Skin is not jaundiced or pale.  Neurological:     General: No focal deficit present.     Mental Status: He is alert and oriented to person, place, and time.     Cranial Nerves: No cranial nerve deficit.     Sensory: No sensory deficit.     Motor: No weakness.     Coordination: Coordination normal.  Psychiatric:        Mood and Affect: Mood normal.        Behavior: Behavior normal.        Thought Content: Thought content normal.        Judgment: Judgment normal.     ED Results / Procedures / Treatments   Labs (all labs ordered are listed, but only abnormal results are displayed) Labs Reviewed  LACTIC ACID, PLASMA - Abnormal; Notable for the following components:      Result Value   Lactic Acid, Venous 3.6 (*)    All other components within normal limits  COMPREHENSIVE METABOLIC PANEL - Abnormal; Notable for the following components:   Glucose, Bld 228 (*)    BUN 49 (*)    All other components within normal limits  CBC WITH DIFFERENTIAL/PLATELET - Abnormal; Notable for the following components:   WBC 21.2 (*)    Hemoglobin 12.7 (*)    Neutro Abs 16.8 (*)    Abs Immature Granulocytes 0.17 (*)    All other components within normal limits  BRAIN  NATRIURETIC PEPTIDE - Abnormal; Notable for the following components:   B Natriuretic Peptide 428.6 (*)    All other components within normal limits  TROPONIN I (HIGH SENSITIVITY) - Abnormal; Notable for the following components:   Troponin I (High Sensitivity) 57 (*)    All other components within normal limits  RESP PANEL BY RT-PCR (  FLU A&B, COVID) ARPGX2  CULTURE, BLOOD (ROUTINE X 2)  CULTURE, BLOOD (ROUTINE X 2)  URINE CULTURE  EXPECTORATED SPUTUM ASSESSMENT W GRAM STAIN, RFLX TO RESP C  PROTIME-INR  MAGNESIUM  LACTIC ACID, PLASMA  URINALYSIS, ROUTINE W REFLEX MICROSCOPIC  BLOOD GAS, VENOUS  TROPONIN I (HIGH SENSITIVITY)    EKG EKG Interpretation  Date/Time:  Wednesday February 26 2022 09:00:41 EST Ventricular Rate:  102 PR Interval:  166 QRS Duration: 103 QT Interval:  312 QTC Calculation: 407 R Axis:   -44 Text Interpretation: Sinus tachycardia Paired ventricular premature complexes Left anterior fascicular block Abnormal R-wave progression, late transition Confirmed by Gloris Manchester [phone]) on 02/26/2022 9:37:39 AM  Radiology CT Angio Chest PE W and/or Wo Contrast  Result Date: 02/26/2022 CLINICAL DATA:  Sepsis. Shortness of breath. Recent antibiotic therapy for pneumonia. Hypoxia. EXAM: CT ANGIOGRAPHY CHEST CT ABDOMEN AND PELVIS WITH CONTRAST TECHNIQUE: Multidetector CT imaging of the chest was performed using the standard protocol during bolus administration of intravenous contrast. Multiplanar CT image reconstructions and MIPs were obtained to evaluate the vascular anatomy. Multidetector CT imaging of the abdomen and pelvis was performed using the standard protocol during bolus administration of intravenous contrast. RADIATION DOSE REDUCTION: This exam was performed according to the departmental dose-optimization program which includes automated exposure control, adjustment of the mA and/or kV according to patient size and/or use of iterative reconstruction technique.  CONTRAST:  86mL OMNIPAQUE IOHEXOL 350 MG/ML SOLN COMPARISON:  Chest radiograph 02/26/2022 and CT abdomen 12/14/2020 FINDINGS: Despite efforts by the technologist and patient, motion artifact is present on today's exam and could not be eliminated. This reduces exam sensitivity and specificity. CTA CHEST FINDINGS Cardiovascular: No filling defect is identified in the pulmonary arterial tree to suggest pulmonary embolus. Reduced sensitivity due to motion artifact. Coronary, aortic arch, and branch vessel atherosclerotic vascular disease. Mild cardiomegaly. Dual lead pacer noted. Mediastinum/Nodes: 1.7 cm hypodense nodule in the right thyroid lobe on image 17 series 2. In the setting of significant comorbidities or limited life expectancy, no follow-up recommended (ref: J Am Coll Radiol. 2015 Feb;12(2): 143-50).No pathologic adenopathy. Lungs/Pleura: Airspace opacities in both lower lobes suspicious for multilobar pneumonia or aspiration pneumonitis. There is some faint airspace opacity peripherally in the right upper lobe on image 47 series 4. Musculoskeletal: Degenerative sternoclavicular arthropathy with some posterior subluxation of the right clavicle along the sternoclavicular joint as shown on image 37 series 4. Thoracic spondylosis. Review of the MIP images confirms the above findings. CT ABDOMEN and PELVIS FINDINGS Hepatobiliary: Unremarkable Pancreas: Atrophic pancreas with small scattered cystic lesions or cystic dilatation of the dorsal pancreatic duct. The largest such lesion is in the pancreatic head and measures 1.2 by 1.2 cm on image 34 series 4, stable based on my measurements compared to 12/14/2020. No progression or change since the prior exam. Spleen: Unremarkable Adrenals/Urinary Tract: Both adrenal glands appear normal. Bosniak category 1 cyst of the left kidney upper pole is benign and warrants no further workup. Urinary bladder and left distal ureter partially obscured by streak artifact from the  left hip implant. Stomach/Bowel: Distended rectum and sigmoid colon, with mild associated wall thickening potentially reflecting distal colitis. The degree of wall thickening is less striking than on the 12/14/2020 exam although the distal colon is substantially more distended with frothy material down on that exam. Along the proximal margin of the dilated segment the proximal sigmoid colon demonstrates some twisting/swirling as on images 33 through 42 of series 6, along the margin of a  diverticulum, but without luminal occlusion/obstruction or other significant indicator of a fulcrum for volvulus. Accordingly I favor a functional abnormality and/or distal colitis over sigmoid volvulus. 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 There is also mild wall calcification in the proximal SMA. No pathologic adenopathy. Reproductive: Prostate gland partially obscured by streak artifact from the patient's left hip implant. Other: No substantial ascites.  Trace perirectal stranding. Musculoskeletal: Left hip hemiarthroplasty. Interdigitating articulation and likely partial fusion at L4-5 and likely partial fusion at L5-S1. Left foraminal impingement at L4-5 due to intervertebral and facet spurring. Probably fused facet joints at L4-5. Mild left foraminal stenosis at L3-4 due to facet arthropathy. Review of the MIP images confirms the above findings. IMPRESSION: 1. No filling defect is identified in the pulmonary arterial tree to suggest pulmonary embolus. Reduced sensitivity due to motion artifact. 2. Airspace opacities in both lower lobes suspicious for multilobar pneumonia or aspiration pneumonitis. There is also some faint airspace opacity peripherally in the right upper lobe. 3. Distended rectum and sigmoid colon, with mild associated wall thickening potentially reflecting distal colitis. The degree of wall thickening is less striking than on the 12/14/2020 exam. There is  some twisting/swirling along the proximal margin of the dilated segment of the proximal sigmoid colon, but without luminal occlusion/obstruction or other significant indicator of a fulcrum for volvulus. Accordingly I favor a functional abnormality and/or distal colitis over sigmoid volvulus. 4. Atrophic pancreas with small scattered cystic lesions or cystic dilatation of the dorsal pancreatic duct. The largest such lesion is in the pancreatic head and measures 1.2 by 1.2 cm. No progression or change since the prior exam. Given the patient's age, follow up pancreatic protocol CT or MRI is recommended in 2 years time. 5. 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 Left foraminal impingement at L4-5 due to intervertebral and facet spurring. 6. Aortic and coronary atherosclerosis. Aortic Atherosclerosis (ICD10-I70.0). Electronically Signed   By: Van Clines M.D.   On: 02/26/2022 11:38   CT ABDOMEN PELVIS W CONTRAST  Result Date: 02/26/2022 CLINICAL DATA:  Sepsis. Shortness of breath. Recent antibiotic therapy for pneumonia. Hypoxia. EXAM: CT ANGIOGRAPHY CHEST CT ABDOMEN AND PELVIS WITH CONTRAST TECHNIQUE: Multidetector CT imaging of the chest was performed using the standard protocol during bolus administration of intravenous contrast. Multiplanar CT image reconstructions and MIPs were obtained to evaluate the vascular anatomy. Multidetector CT imaging of the abdomen and pelvis was performed using the standard protocol during bolus administration of intravenous contrast. RADIATION DOSE REDUCTION: This exam was performed according to the departmental dose-optimization program which includes automated exposure control, adjustment of the mA and/or kV according to patient size and/or use of iterative reconstruction technique. CONTRAST:  61mL OMNIPAQUE IOHEXOL 350 MG/ML SOLN COMPARISON:  Chest radiograph 02/26/2022 and CT abdomen 12/14/2020 FINDINGS: Despite efforts by the technologist  and patient, motion artifact is present on today's exam and could not be eliminated. This reduces exam sensitivity and specificity. CTA CHEST FINDINGS Cardiovascular: No filling defect is identified in the pulmonary arterial tree to suggest pulmonary embolus. Reduced sensitivity due to motion artifact. Coronary, aortic arch, and branch vessel atherosclerotic vascular disease. Mild cardiomegaly. Dual lead pacer noted. Mediastinum/Nodes: 1.7 cm hypodense nodule in the right thyroid lobe on image 17 series 2. In the setting of significant comorbidities or limited life expectancy, no follow-up recommended (ref: J Am Coll Radiol. 2015 Feb;12(2): 143-50).No pathologic adenopathy. Lungs/Pleura: Airspace opacities in both lower lobes suspicious for multilobar pneumonia or aspiration pneumonitis. There  is some faint airspace opacity peripherally in the right upper lobe on image 47 series 4. Musculoskeletal: Degenerative sternoclavicular arthropathy with some posterior subluxation of the right clavicle along the sternoclavicular joint as shown on image 37 series 4. Thoracic spondylosis. Review of the MIP images confirms the above findings. CT ABDOMEN and PELVIS FINDINGS Hepatobiliary: Unremarkable Pancreas: Atrophic pancreas with small scattered cystic lesions or cystic dilatation of the dorsal pancreatic duct. The largest such lesion is in the pancreatic head and measures 1.2 by 1.2 cm on image 34 series 4, stable based on my measurements compared to 12/14/2020. No progression or change since the prior exam. Spleen: Unremarkable Adrenals/Urinary Tract: Both adrenal glands appear normal. Bosniak category 1 cyst of the left kidney upper pole is benign and warrants no further workup. Urinary bladder and left distal ureter partially obscured by streak artifact from the left hip implant. Stomach/Bowel: Distended rectum and sigmoid colon, with mild associated wall thickening potentially reflecting distal colitis. The degree of  wall thickening is less striking than on the 12/14/2020 exam although the distal colon is substantially more distended with frothy material down on that exam. Along the proximal margin of the dilated segment the proximal sigmoid colon demonstrates some twisting/swirling as on images 33 through 42 of series 6, along the margin of a diverticulum, but without luminal occlusion/obstruction or other significant indicator of a fulcrum for volvulus. Accordingly I favor a functional abnormality and/or distal colitis over sigmoid volvulus. 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 There is also mild wall calcification in the proximal SMA. No pathologic adenopathy. Reproductive: Prostate gland partially obscured by streak artifact from the patient's left hip implant. Other: No substantial ascites.  Trace perirectal stranding. Musculoskeletal: Left hip hemiarthroplasty. Interdigitating articulation and likely partial fusion at L4-5 and likely partial fusion at L5-S1. Left foraminal impingement at L4-5 due to intervertebral and facet spurring. Probably fused facet joints at L4-5. Mild left foraminal stenosis at L3-4 due to facet arthropathy. Review of the MIP images confirms the above findings. IMPRESSION: 1. No filling defect is identified in the pulmonary arterial tree to suggest pulmonary embolus. Reduced sensitivity due to motion artifact. 2. Airspace opacities in both lower lobes suspicious for multilobar pneumonia or aspiration pneumonitis. There is also some faint airspace opacity peripherally in the right upper lobe. 3. Distended rectum and sigmoid colon, with mild associated wall thickening potentially reflecting distal colitis. The degree of wall thickening is less striking than on the 12/14/2020 exam. There is some twisting/swirling along the proximal margin of the dilated segment of the proximal sigmoid colon, but without luminal occlusion/obstruction or other  significant indicator of a fulcrum for volvulus. Accordingly I favor a functional abnormality and/or distal colitis over sigmoid volvulus. 4. Atrophic pancreas with small scattered cystic lesions or cystic dilatation of the dorsal pancreatic duct. The largest such lesion is in the pancreatic head and measures 1.2 by 1.2 cm. No progression or change since the prior exam. Given the patient's age, follow up pancreatic protocol CT or MRI is recommended in 2 years time. 5. 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 Left foraminal impingement at L4-5 due to intervertebral and facet spurring. 6. Aortic and coronary atherosclerosis. Aortic Atherosclerosis (ICD10-I70.0). Electronically Signed   By: Van Clines M.D.   On: 02/26/2022 11:38   DG Chest Port 1 View  Result Date: 02/26/2022 CLINICAL DATA:  Evaluate sepsis EXAM: PORTABLE CHEST 1 VIEW COMPARISON:  None Available. FINDINGS: LEFT-sided pacer overlies normal cardiac silhouette.  No effusion, infiltrate or pneumothorax. No acute osseous abnormality. IMPRESSION: No evidence of pulmonary infection. Electronically Signed   By: Suzy Bouchard M.D.   On: 02/26/2022 09:36    Procedures Procedures    Medications Ordered in ED Medications  lactated ringers infusion (has no administration in time range)  lactated ringers bolus 2,000 mL (2,000 mLs Intravenous New Bag/Given 02/26/22 0922)  ceFEPIme (MAXIPIME) 2 g in sodium chloride 0.9 % 100 mL IVPB (0 g Intravenous Stopped 02/26/22 1038)  metroNIDAZOLE (FLAGYL) IVPB 500 mg (0 mg Intravenous Stopped 02/26/22 1038)  vancomycin (VANCOCIN) IVPB 1000 mg/200 mL premix (0 mg Intravenous Stopped 02/26/22 1038)  acetaminophen (TYLENOL) tablet 1,000 mg (1,000 mg Oral Given 02/26/22 1005)  iohexol (OMNIPAQUE) 350 MG/ML injection 100 mL (75 mLs Intravenous Contrast Given 02/26/22 1052)  sodium chloride (PF) 0.9 % injection (  Given by Other 02/26/22 1122)    ED Course/ Medical  Decision Making/ A&P                           Medical Decision Making Amount and/or Complexity of Data Reviewed Labs: ordered. Radiology: ordered. ECG/medicine tests: ordered.  Risk OTC drugs. Prescription drug management.   This patient presents to the ED for concern of shortness of breath, fatigue and generalized weakness, this involves an extensive number of treatment options, and is a complaint that carries with it a high risk of complications and morbidity.  The differential diagnosis includes pneumonia, sepsis, PE, CHF, URI, pleural effusion   Co morbidities that complicate the patient evaluation  HTN, diabetes, anemia, sinus node dysfunction s/p pacemaker, BPH, atrial fibrillation   Additional history obtained:  Additional history obtained from N/A External records from outside source obtained and reviewed including EMR   Lab Tests:  I Ordered, and personally interpreted labs.  The pertinent results include: Leukocytosis and lactic acidosis consistent with sepsis.  Lab work is otherwise unremarkable.   Imaging Studies ordered:  I ordered imaging studies including CTA chest, CT of abdomen and pelvis I independently visualized and interpreted imaging which showed CTA showed bibasilar pneumonia.  CT of abdomen and pelvis showed possible distal colitis. I agree with the radiologist interpretation   Cardiac Monitoring: / EKG:  The patient was maintained on a cardiac monitor.  I personally viewed and interpreted the cardiac monitored which showed an underlying rhythm of: Sinus rhythm   Problem List / ED Course / Critical interventions / Medication management  Patient is a 88 year old male presenting from independent living facility for shortness of breath, generalized weakness, and fatigue.  EMS noted hypoxia on room air.  Patient is not on supplemental oxygen at baseline.  This was confirmed on his arrival with SPO2 of 85% on room air.  Patient was placed on  supplemental oxygen.  Vital signs are also notable for tachycardia, tachypnea, and low-grade fever.  Patient is ill-appearing but he is alert and oriented.  His presentation is consistent with sepsis.  Septic work-up and treatment was initiated on his arrival.  He does arrive with a DNR form.  He does confirm that he would not want CPR.  He is agreeable to intubation if he needs it.  Physical exam is notable for increased work of breathing, coarse lung sounds and dry oromucosa.  Patient was given Tylenol for antipyresis.  On reassessment, patient has continued increased work of breathing.  BiPAP was initiated.  Work shows leukocytosis and lactic acidosis consistent with sepsis.  On CT imaging,  patient is found to have bibasilar pneumonia.  There is also a possible distal colitis.  Patient remained hemodynamically stable while in the ED.  He is tolerating BiPAP well.  He was admitted to hospice for further management. I ordered medication including IV fluids and broad-spectrum antibiotics for sepsis; Tylenol for antipyresis Reevaluation of the patient after these medicines showed that the patient improved I have reviewed the patients home medicines and have made adjustments as needed   Social Determinants of Health:  Lives independently  CRITICAL CARE Performed by: Godfrey Pick   Total critical care time: 35 minutes  Critical care time was exclusive of separately billable procedures and treating other patients.  Critical care was necessary to treat or prevent imminent or life-threatening deterioration.  Critical care was time spent personally by me on the following activities: development of treatment plan with patient and/or surrogate as well as nursing, discussions with consultants, evaluation of patient's response to treatment, examination of patient, obtaining history from patient or surrogate, ordering and performing treatments and interventions, ordering and review of laboratory studies,  ordering and review of radiographic studies, pulse oximetry and re-evaluation of patient's condition.          Final Clinical Impression(s) / ED Diagnoses Final diagnoses:  Multifocal pneumonia  Sepsis with acute hypoxic respiratory failure, due to unspecified organism, unspecified whether septic shock present [facility])    Rx / DC Orders ED Discharge Orders     None         Godfrey Pick, MD 02/26/22 1147

## 2022-02-26 NOTE — Progress Notes (Signed)
RT NOTE:  Pt placed on BiPAP per EDP order. Pt states he feels comfortable on BiPAP at this time, vitals are stable, RN aware. RT will continue to monitor pt status.

## 2022-02-26 NOTE — Progress Notes (Addendum)
Pharmacy Antibiotic Note  Nathan Breighner [name] is a 88 y.o. male admitted on 02/26/2022 with pneumonia.  Pharmacy has been consulted for Vanco, Cefepime dosing.  Active Problem(s): from a facility with c/o SOB, hx pneumonia just finished antibiotics. 82% sats on RA  ID: HCAP  (outpatient abx unknown) - Temp 100.7. WBC 21.12  LA 3.6. Scr 1.  11/22: Cefepime>> 11/22: Vanco>>  11/22: Flu/Covid neg  Plan: Vanco 1g, Cefepime 2g in ED Cefepime 2g IV q48h Vancomycin 750 mg IV Q 24 hrs. Goal AUC 400-550. Expected AUC: 505 SCr used: 1    Height: 5\' 7"  (170.2 cm) Weight: 58 kg (127 lb 13.9 oz) IBW/kg (Calculated) : 66.1  Temp (24hrs), Avg:100.7 F (38.2 C), Min:100.7 F (38.2 C), Max:100.7 F (38.2 C)  Recent Labs  Lab 02/26/22 0904  WBC 21.2*  CREATININE 1.04  LATICACIDVEN 3.6*    Estimated Creatinine Clearance: 35.6 mL/min (by C-G formula based on SCr of 1.04 mg/dL).    Allergies  Allergen Reactions   Penicillins     unknown    Nathan Ryther S. 02/28/22, PharmD, BCPS Clinical Staff Pharmacist [email or web address]  Nathan Alvarez 02/26/2022 1:21 PM

## 2022-02-26 NOTE — Evaluation (Addendum)
SLP Cancellation Note  Patient Details Name: Denario Bagot [name] MRN: [id number] DOB: 08-Jan-1925   Cancelled treatment:       Reason Eval/Treat Not Completed: Other (comment) (pt not medically ready for swallow evaluation - yellow MEWS and Bipap- will continue efforts; has h/o esophageal dysmotility per prior esophagram)  Rolena Infante, MS [facility] SLP Acute Rehab Services Office [phone] Pager [phone]  Chales Abrahams 02/26/2022, 3:20 PM

## 2022-02-26 NOTE — Progress Notes (Signed)
  Echocardiogram 2D Echocardiogram has been performed.  Ebony Cargo 02/26/2022, 4:22 PM

## 2022-02-27 ENCOUNTER — Inpatient Hospital Stay (HOSPITAL_COMMUNITY): Payer: Medicare Other

## 2022-02-27 DIAGNOSIS — I5022 Chronic systolic (congestive) heart failure: Secondary | ICD-10-CM | POA: Diagnosis not present

## 2022-02-27 DIAGNOSIS — K529 Noninfective gastroenteritis and colitis, unspecified: Secondary | ICD-10-CM

## 2022-02-27 DIAGNOSIS — J189 Pneumonia, unspecified organism: Secondary | ICD-10-CM

## 2022-02-27 DIAGNOSIS — A419 Sepsis, unspecified organism: Secondary | ICD-10-CM | POA: Diagnosis not present

## 2022-02-27 DIAGNOSIS — E1129 Type 2 diabetes mellitus with other diabetic kidney complication: Secondary | ICD-10-CM

## 2022-02-27 DIAGNOSIS — K219 Gastro-esophageal reflux disease without esophagitis: Secondary | ICD-10-CM

## 2022-02-27 DIAGNOSIS — I21A1 Myocardial infarction type 2: Secondary | ICD-10-CM | POA: Diagnosis not present

## 2022-02-27 DIAGNOSIS — R7989 Other specified abnormal findings of blood chemistry: Secondary | ICD-10-CM | POA: Diagnosis not present

## 2022-02-27 DIAGNOSIS — I495 Sick sinus syndrome: Secondary | ICD-10-CM

## 2022-02-27 DIAGNOSIS — I10 Essential (primary) hypertension: Secondary | ICD-10-CM

## 2022-02-27 LAB — CBC
HCT: 31.7 % — ABNORMAL LOW (ref 39.0–52.0)
HCT: 33.5 % — ABNORMAL LOW (ref 39.0–52.0)
Hemoglobin: 10.3 g/dL — ABNORMAL LOW (ref 13.0–17.0)
Hemoglobin: 10.8 g/dL — ABNORMAL LOW (ref 13.0–17.0)
MCH: 30.3 pg (ref 26.0–34.0)
MCH: 30.1 pg (ref 26.0–34.0)
MCHC: 32.5 g/dL (ref 30.0–36.0)
MCHC: 32.2 g/dL (ref 30.0–36.0)
MCV: 93.2 fL (ref 80.0–100.0)
MCV: 93.3 fL (ref 80.0–100.0)
Platelets: 187 10*3/uL (ref 150–400)
Platelets: 212 10*3/uL (ref 150–400)
RBC: 3.4 MIL/uL — ABNORMAL LOW (ref 4.22–5.81)
RBC: 3.59 MIL/uL — ABNORMAL LOW (ref 4.22–5.81)
RDW: 14.6 % (ref 11.5–15.5)
RDW: 14.6 % (ref 11.5–15.5)
WBC: 19.6 10*3/uL — ABNORMAL HIGH (ref 4.0–10.5)
WBC: 21.3 10*3/uL — ABNORMAL HIGH (ref 4.0–10.5)
nRBC: 0 % (ref 0.0–0.2)
nRBC: 0 % (ref 0.0–0.2)

## 2022-02-27 LAB — GLUCOSE, CAPILLARY
Glucose-Capillary: 102 mg/dL — ABNORMAL HIGH (ref 70–99)
Glucose-Capillary: 107 mg/dL — ABNORMAL HIGH (ref 70–99)
Glucose-Capillary: 121 mg/dL — ABNORMAL HIGH (ref 70–99)
Glucose-Capillary: 122 mg/dL — ABNORMAL HIGH (ref 70–99)
Glucose-Capillary: 134 mg/dL — ABNORMAL HIGH (ref 70–99)
Glucose-Capillary: 158 mg/dL — ABNORMAL HIGH (ref 70–99)
Glucose-Capillary: 56 mg/dL — ABNORMAL LOW (ref 70–99)
Glucose-Capillary: 62 mg/dL — ABNORMAL LOW (ref 70–99)
Glucose-Capillary: 67 mg/dL — ABNORMAL LOW (ref 70–99)
Glucose-Capillary: 71 mg/dL (ref 70–99)
Glucose-Capillary: 74 mg/dL (ref 70–99)
Glucose-Capillary: 78 mg/dL (ref 70–99)
Glucose-Capillary: 83 mg/dL (ref 70–99)
Glucose-Capillary: 88 mg/dL (ref 70–99)
Glucose-Capillary: 92 mg/dL (ref 70–99)

## 2022-02-27 LAB — COMPREHENSIVE METABOLIC PANEL
ALT: 9 U/L (ref 0–44)
AST: 22 U/L (ref 15–41)
Albumin: 2.8 g/dL — ABNORMAL LOW (ref 3.5–5.0)
Alkaline Phosphatase: 43 U/L (ref 38–126)
Anion gap: 5 (ref 5–15)
BUN: 39 mg/dL — ABNORMAL HIGH (ref 8–23)
CO2: 22 mmol/L (ref 22–32)
Calcium: 7.9 mg/dL — ABNORMAL LOW (ref 8.9–10.3)
Chloride: 110 mmol/L (ref 98–111)
Creatinine, Ser: 0.88 mg/dL (ref 0.61–1.24)
GFR, Estimated: >60 mL/min (ref >60)
Glucose, Bld: 91 mg/dL (ref 70–99)
Potassium: 4 mmol/L (ref 3.5–5.1)
Sodium: 137 mmol/L (ref 135–145)
Total Bilirubin: 0.7 mg/dL (ref 0.3–1.2)
Total Protein: 5.5 g/dL — ABNORMAL LOW (ref 6.5–8.1)

## 2022-02-27 LAB — URINALYSIS, ROUTINE W REFLEX MICROSCOPIC
Bilirubin Urine: NEGATIVE
Glucose, UA: NEGATIVE mg/dL
Ketones, ur: NEGATIVE mg/dL
Nitrite: NEGATIVE
Protein, ur: NEGATIVE mg/dL
Specific Gravity, Urine: 1.01 (ref 1.005–1.030)
pH: 5 (ref 5.0–8.0)

## 2022-02-27 LAB — BRAIN NATRIURETIC PEPTIDE: B Natriuretic Peptide: 1443.2 pg/mL — ABNORMAL HIGH (ref 0.0–100.0)

## 2022-02-27 LAB — CORTISOL-AM, BLOOD: Cortisol - AM: 14.3 ug/dL (ref 6.7–22.6)

## 2022-02-27 LAB — HEPARIN LEVEL (UNFRACTIONATED)
Heparin Unfractionated: 0.13 IU/mL — ABNORMAL LOW (ref 0.30–0.70)
Heparin Unfractionated: 0.33 IU/mL (ref 0.30–0.70)

## 2022-02-27 LAB — TSH: TSH: 1.74 u[IU]/mL (ref 0.350–4.500)

## 2022-02-27 LAB — CORTISOL: Cortisol, Plasma: 17.4 ug/dL

## 2022-02-27 LAB — PROCALCITONIN: Procalcitonin: 2.14 ng/mL

## 2022-02-27 LAB — PROTIME-INR
INR: 1.4 — ABNORMAL HIGH (ref 0.8–1.2)
Prothrombin Time: 16.8 seconds — ABNORMAL HIGH (ref 11.4–15.2)

## 2022-02-27 LAB — LACTIC ACID, PLASMA: Lactic Acid, Venous: 1.7 mmol/L (ref 0.5–1.9)

## 2022-02-27 MED ORDER — FUROSEMIDE 10 MG/ML IJ SOLN
20.0000 mg | Freq: Two times a day (BID) | INTRAMUSCULAR | Status: AC
  Administered 2022-02-27 – 2022-02-28 (×2): 20 mg via INTRAVENOUS
  Filled 2022-02-27 (×2): qty 2

## 2022-02-27 MED ORDER — DEXTROSE 50 % IV SOLN
12.5000 g | INTRAVENOUS | Status: AC
  Administered 2022-02-27: 12.5 g via INTRAVENOUS
  Filled 2022-02-27: qty 50

## 2022-02-27 MED ORDER — ALBUMIN HUMAN 25 % IV SOLN
25.0000 g | Freq: Once | INTRAVENOUS | Status: DC
  Filled 2022-02-27: qty 100

## 2022-02-27 MED ORDER — DEXTROSE 50 % IV SOLN
INTRAVENOUS | Status: AC
  Administered 2022-02-27: 12.5 g via INTRAVENOUS
  Filled 2022-02-27: qty 50

## 2022-02-27 MED ORDER — HEPARIN BOLUS VIA INFUSION
2000.0000 [IU] | Freq: Once | INTRAVENOUS | Status: AC
  Administered 2022-02-27: 2000 [IU] via INTRAVENOUS
  Filled 2022-02-27: qty 2000

## 2022-02-27 MED ORDER — SODIUM CHLORIDE 0.9 % IV SOLN
2.0000 g | Freq: Two times a day (BID) | INTRAVENOUS | Status: DC
  Administered 2022-02-27 – 2022-03-02 (×8): 2 g via INTRAVENOUS
  Filled 2022-02-27 (×8): qty 12.5

## 2022-02-27 MED ORDER — DEXTROSE 50 % IV SOLN: 12.5000 g | INTRAVENOUS | Status: AC

## 2022-02-27 MED ORDER — GUAIFENESIN ER 600 MG PO TB12
1200.0000 mg | ORAL_TABLET | Freq: Two times a day (BID) | ORAL | Status: DC
  Administered 2022-02-27 – 2022-03-05 (×10): 1200 mg via ORAL
  Filled 2022-02-27 (×13): qty 2

## 2022-02-27 MED ORDER — LORATADINE 10 MG PO TABS
10.0000 mg | ORAL_TABLET | Freq: Every day | ORAL | Status: DC
  Administered 2022-02-27 – 2022-03-05 (×7): 10 mg via ORAL
  Filled 2022-02-27 (×7): qty 1

## 2022-02-27 MED ORDER — FLUTICASONE PROPIONATE 50 MCG/ACT NA SUSP
2.0000 | Freq: Every day | NASAL | Status: DC
  Administered 2022-02-27 – 2022-03-05 (×6): 2 via NASAL
  Filled 2022-02-27: qty 16

## 2022-02-27 MED ORDER — DEXTROSE 10 % IV SOLN: INTRAVENOUS | Status: DC

## 2022-02-27 NOTE — Progress Notes (Signed)
Hypoglycemic Event  CBG: 67  Treatment: 4 oz juice/soda  Symptoms: None  Follow-up CBG: Time: 1130 CBG Result:88  Possible Reasons for Event: Inadequate meal intake  Comments/MD notified:Daniel Thompson,MD     Laurian Brim

## 2022-02-27 NOTE — Progress Notes (Signed)
Rounding Note    Patient Name: Nathan Weider [name] Date of Encounter: 02/27/2022  [facility] Cardiologist: Lewayne Bunting, MD   Subjective   Resting comfortably in bed.  Now off of BiPAP.  Inpatient Medications    Scheduled Meds:  Chlorhexidine Gluconate Cloth  6 each Topical Q0600   dextrose       guaiFENesin  600 mg Oral BID   mouth rinse  15 mL Mouth Rinse 4 times per day   pantoprazole  40 mg Oral Daily   Continuous Infusions:  ceFEPime (MAXIPIME) IV     dextrose 100 mL/hr at 02/27/22 0455   heparin 950 Units/hr (02/27/22 0455)   metronidazole Stopped (02/26/22 2258)   vancomycin     PRN Meds: acetaminophen **OR** acetaminophen, albuterol, dextrose, mouth rinse   Vital Signs    Vitals:   02/27/22 0300 02/27/22 0400 02/27/22 0500 02/27/22 0600  BP: (!) 128/46 (!) 108/44 (!) 154/54 (!) 99/44  Pulse: 69 72 67 67  Resp: (!) 24 (!) 24 (!) 27 (!) 23  Temp:  98 F (36.7 C)    TempSrc:  Axillary    SpO2: 97% 96% 96% 96%  Weight:      Height:        Intake/Output Summary (Last 24 hours) at 02/27/2022 0729 Last data filed at 02/27/2022 1660 Gross per 24 hour  Intake 3307.16 ml  Output 550 ml  Net 2757.16 ml      02/26/2022    1:55 PM 02/26/2022    9:19 AM 07/22/2021    3:06 PM  Last 3 Weights  Weight (lbs) 130 lb 8.2 oz 127 lb 13.9 oz 127 lb  Weight (kg) 59.2 kg 58 kg 57.607 kg      Telemetry    Occasional ventricular paced rhythm, occasional PVCs, sinus rhythm- Personally Reviewed  ECG    Normal sinus rhythm, left axis deviation, no ischemic changes- Personally Reviewed  Physical Exam   GEN: On back in bed, resting comfortably without BiPAP currently Neck: No JVD Cardiac: RRR, no murmurs, rubs, or gallops.  Respiratory: Somewhat coarse breath sounds bilaterally. GI: Soft, nontender, non-distended  MS: No edema; No deformity. Neuro:  Nonfocal  Psych: Normal affect   Labs    High Sensitivity Troponin:   Recent Labs  Lab  02/26/22 0904 02/26/22 1350  TROPONINIHS 57* 1,387*     Chemistry Recent Labs  Lab 02/26/22 0904 02/27/22 0020  NA 137 137  K 4.7 4.0  CL 105 110  CO2 23 22  GLUCOSE 228* 91  BUN 49* 39*  CREATININE 1.04 0.88  CALCIUM 9.0 7.9*  MG 2.1  --   PROT 8.1 5.5*  ALBUMIN 4.0 2.8*  AST 19 22  ALT 12 9  ALKPHOS 63 43  BILITOT 0.4 0.7  GFRNONAA >60 >60  ANIONGAP 9 5    Lipids No results for input(s): "CHOL", "TRIG", "HDL", "LABVLDL", "LDLCALC", "CHOLHDL" in the last 168 hours.  Hematology Recent Labs  Lab 02/26/22 0904 02/27/22 0020  WBC 21.2* 19.6*  RBC 4.22 3.40*  HGB 12.7* 10.3*  HCT 39.9 31.7*  MCV 94.5 93.2  MCH 30.1 30.3  MCHC 31.8 32.5  RDW 14.6 14.6  PLT 323 187   Thyroid No results for input(s): "TSH", "FREET4" in the last 168 hours.  BNP Recent Labs  Lab 02/26/22 0904  BNP 428.6*    DDimer No results for input(s): "DDIMER" in the last 168 hours.   Radiology    ECHOCARDIOGRAM COMPLETE  Result Date: 02/26/2022    ECHOCARDIOGRAM REPORT   Patient Name:   Nathan Propson [name] Date of Exam: 02/26/2022 Medical Rec #:  [id number]          Height:       67.0 in Accession #:    [id number]         Weight:       127.9 lb Date of Birth:  [birth year]         BSA:          1.672 m Patient Age:    65 years           BP:           105/66 mmHg Patient Gender: M                  HR:           80 bpm. Exam Location:  Inpatient Procedure: 2D Echo and Intracardiac Opacification Agent Indications:    elevated troponin  History:        Patient has prior history of Echocardiogram examinations, most                 recent 03/20/2021. Pacemaker; Risk Factors:Diabetes and                 Hypertension.  Sonographer:    Harvie Junior Referring Phys: [id number] Jonnie Finner  Sonographer Comments: Technically difficult study due to poor echo windows, echo performed with patient supine and on artificial respirator and no subcostal window. IMPRESSIONS  1. Left ventricular ejection fraction, by  estimation, is 30 to 35%. The left ventricle has moderately decreased function. The left ventricle demonstrates global hypokinesis. There is severe asymmetric left ventricular hypertrophy of the basal-septal segment. Left ventricular diastolic parameters are consistent with Grade I diastolic dysfunction (impaired relaxation).  2. Right ventricular systolic function is normal. The right ventricular size is mildly enlarged. There is mildly elevated pulmonary artery systolic pressure.  3. The mitral valve is grossly normal. No evidence of mitral valve regurgitation. No evidence of mitral stenosis.  4. The aortic valve was not well visualized. Aortic valve regurgitation is not visualized. Comparison(s): Prior images reviewed side by side. Similar LVEF- this study is more technically difficult that prior. FINDINGS  Left Ventricle: Left ventricular ejection fraction, by estimation, is 30 to 35%. The left ventricle has moderately decreased function. The left ventricle demonstrates global hypokinesis. Definity contrast agent was given IV to delineate the left ventricular endocardial borders. The left ventricular internal cavity size was normal in size. There is severe asymmetric left ventricular hypertrophy of the basal-septal segment. Left ventricular diastolic parameters are consistent with Grade I diastolic dysfunction (impaired relaxation). Right Ventricle: The right ventricular size is mildly enlarged. No increase in right ventricular wall thickness. Right ventricular systolic function is normal. There is mildly elevated pulmonary artery systolic pressure. The tricuspid regurgitant velocity is 2.92 m/s, and with an assumed right atrial pressure of 3 mmHg, the estimated right ventricular systolic pressure is [id number] mmHg. Left Atrium: Left atrial size was normal in size. Right Atrium: Right atrial size was normal in size. Pericardium: There is no evidence of pericardial effusion. Presence of epicardial fat layer. Mitral  Valve: The mitral valve is grossly normal. No evidence of mitral valve regurgitation. No evidence of mitral valve stenosis. Tricuspid Valve: The tricuspid valve is normal in structure. Tricuspid valve regurgitation is not demonstrated. No evidence of tricuspid stenosis. Aortic Valve: The aortic valve was not well  visualized. Aortic valve regurgitation is not visualized. Aortic regurgitation PHT measures 767 msec. Aortic valve mean gradient measures 5.0 mmHg. Aortic valve peak gradient measures 8.6 mmHg. Pulmonic Valve: The pulmonic valve was not well visualized. Pulmonic valve regurgitation is not visualized. No evidence of pulmonic stenosis. Aorta: The aortic root is normal in size and structure. IAS/Shunts: No atrial level shunt detected by color flow Doppler.  LEFT VENTRICLE PLAX 2D LVIDd:         4.40 cm      Diastology LVIDs:         3.50 cm      LV e' medial:    4.79 cm/s LV PW:         1.00 cm      LV E/e' medial:  7.2 LV IVS:        1.00 cm      LV e' lateral:   6.64 cm/s                             LV E/e' lateral: 5.2  LV Volumes (MOD) LV vol d, MOD A2C: 102.0 ml LV vol d, MOD A4C: 81.1 ml LV vol s, MOD A2C: 61.2 ml LV vol s, MOD A4C: 60.6 ml LV SV MOD A2C:     40.8 ml LV SV MOD A4C:     81.1 ml LV SV MOD BP:      30.5 ml RIGHT VENTRICLE RV Basal diam:  4.40 cm RV Mid diam:    3.90 cm TAPSE (M-mode): 2.0 cm LEFT ATRIUM             Index        RIGHT ATRIUM           Index LA diam:        3.70 cm 2.21 cm/m   RA Area:     15.40 cm LA Vol (A2C):   47.8 ml 28.59 ml/m  RA Volume:   41.00 ml  24.52 ml/m LA Vol (A4C):   31.5 ml 18.84 ml/m LA Biplane Vol: 37.4 ml 22.37 ml/m  AORTIC VALVE                    PULMONIC VALVE AV Vmax:           147.00 cm/s  PV Vmax:       0.77 m/s AV Vmean:          101.000 cm/s PV Peak grad:  2.4 mmHg AV VTI:            0.229 m AV Peak Grad:      8.6 mmHg AV Mean Grad:      5.0 mmHg LVOT Vmax:         88.80 cm/s LVOT Vmean:        63.200 cm/s LVOT VTI:          0.159 m LVOT/AV  VTI ratio: 0.69 AI PHT:            767 msec MITRAL VALVE               TRICUSPID VALVE MV Area (PHT): 2.34 cm    TR Peak grad:   34.1 mmHg MV Decel Time: 324 msec    TR Vmax:        292.00 cm/s MR Peak grad: 50.3 mmHg MR Vmax:      354.67 cm/s  SHUNTS MV E velocity: 34.70 cm/s  Systemic  VTI: 0.16 m MV A velocity: 75.80 cm/s MV E/A ratio:  0.46 Riley Lam MD Electronically signed by Riley Lam MD Signature Date/Time: 02/26/2022/4:27:14 PM    Final    CT Angio Chest PE W and/or Wo Contrast  Result Date: 02/26/2022 CLINICAL DATA:  Sepsis. Shortness of breath. Recent antibiotic therapy for pneumonia. Hypoxia. EXAM: CT ANGIOGRAPHY CHEST CT ABDOMEN AND PELVIS WITH CONTRAST TECHNIQUE: Multidetector CT imaging of the chest was performed using the standard protocol during bolus administration of intravenous contrast. Multiplanar CT image reconstructions and MIPs were obtained to evaluate the vascular anatomy. Multidetector CT imaging of the abdomen and pelvis was performed using the standard protocol during bolus administration of intravenous contrast. RADIATION DOSE REDUCTION: This exam was performed according to the departmental dose-optimization program which includes automated exposure control, adjustment of the mA and/or kV according to patient size and/or use of iterative reconstruction technique. CONTRAST:  44mL OMNIPAQUE IOHEXOL 350 MG/ML SOLN COMPARISON:  Chest radiograph 02/26/2022 and CT abdomen 12/14/2020 FINDINGS: Despite efforts by the technologist and patient, motion artifact is present on today's exam and could not be eliminated. This reduces exam sensitivity and specificity. CTA CHEST FINDINGS Cardiovascular: No filling defect is identified in the pulmonary arterial tree to suggest pulmonary embolus. Reduced sensitivity due to motion artifact. Coronary, aortic arch, and branch vessel atherosclerotic vascular disease. Mild cardiomegaly. Dual lead pacer noted. Mediastinum/Nodes: 1.7  cm hypodense nodule in the right thyroid lobe on image 17 series 2. In the setting of significant comorbidities or limited life expectancy, no follow-up recommended (ref: J Am Coll Radiol. 2015 Feb;12(2): 143-50).No pathologic adenopathy. Lungs/Pleura: Airspace opacities in both lower lobes suspicious for multilobar pneumonia or aspiration pneumonitis. There is some faint airspace opacity peripherally in the right upper lobe on image 47 series 4. Musculoskeletal: Degenerative sternoclavicular arthropathy with some posterior subluxation of the right clavicle along the sternoclavicular joint as shown on image 37 series 4. Thoracic spondylosis. Review of the MIP images confirms the above findings. CT ABDOMEN and PELVIS FINDINGS Hepatobiliary: Unremarkable Pancreas: Atrophic pancreas with small scattered cystic lesions or cystic dilatation of the dorsal pancreatic duct. The largest such lesion is in the pancreatic head and measures 1.2 by 1.2 cm on image 34 series 4, stable based on my measurements compared to 12/14/2020. No progression or change since the prior exam. Spleen: Unremarkable Adrenals/Urinary Tract: Both adrenal glands appear normal. Bosniak category 1 cyst of the left kidney upper pole is benign and warrants no further workup. Urinary bladder and left distal ureter partially obscured by streak artifact from the left hip implant. Stomach/Bowel: Distended rectum and sigmoid colon, with mild associated wall thickening potentially reflecting distal colitis. The degree of wall thickening is less striking than on the 12/14/2020 exam although the distal colon is substantially more distended with frothy material down on that exam. Along the proximal margin of the dilated segment the proximal sigmoid colon demonstrates some twisting/swirling as on images 33 through 42 of series 6, along the margin of a diverticulum, but without luminal occlusion/obstruction or other significant indicator of a fulcrum for volvulus.  Accordingly I favor a functional abnormality and/or distal colitis over sigmoid volvulus. 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 There is also mild wall calcification in the proximal SMA. No pathologic adenopathy. Reproductive: Prostate gland partially obscured by streak artifact from the patient's left hip implant. Other: No substantial ascites.  Trace perirectal stranding. Musculoskeletal: Left hip hemiarthroplasty. Interdigitating articulation and likely partial fusion at L4-5 and likely partial  fusion at L5-S1. Left foraminal impingement at L4-5 due to intervertebral and facet spurring. Probably fused facet joints at L4-5. Mild left foraminal stenosis at L3-4 due to facet arthropathy. Review of the MIP images confirms the above findings. IMPRESSION: 1. No filling defect is identified in the pulmonary arterial tree to suggest pulmonary embolus. Reduced sensitivity due to motion artifact. 2. Airspace opacities in both lower lobes suspicious for multilobar pneumonia or aspiration pneumonitis. There is also some faint airspace opacity peripherally in the right upper lobe. 3. Distended rectum and sigmoid colon, with mild associated wall thickening potentially reflecting distal colitis. The degree of wall thickening is less striking than on the 12/14/2020 exam. There is some twisting/swirling along the proximal margin of the dilated segment of the proximal sigmoid colon, but without luminal occlusion/obstruction or other significant indicator of a fulcrum for volvulus. Accordingly I favor a functional abnormality and/or distal colitis over sigmoid volvulus. 4. Atrophic pancreas with small scattered cystic lesions or cystic dilatation of the dorsal pancreatic duct. The largest such lesion is in the pancreatic head and measures 1.2 by 1.2 cm. No progression or change since the prior exam. Given the patient's age, follow up pancreatic protocol CT or MRI is recommended  in 2 years time. 5. 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 Left foraminal impingement at L4-5 due to intervertebral and facet spurring. 6. Aortic and coronary atherosclerosis. Aortic Atherosclerosis (ICD10-I70.0). Electronically Signed   By: Van Clines M.D.   On: 02/26/2022 11:38   CT ABDOMEN PELVIS W CONTRAST  Result Date: 02/26/2022 CLINICAL DATA:  Sepsis. Shortness of breath. Recent antibiotic therapy for pneumonia. Hypoxia. EXAM: CT ANGIOGRAPHY CHEST CT ABDOMEN AND PELVIS WITH CONTRAST TECHNIQUE: Multidetector CT imaging of the chest was performed using the standard protocol during bolus administration of intravenous contrast. Multiplanar CT image reconstructions and MIPs were obtained to evaluate the vascular anatomy. Multidetector CT imaging of the abdomen and pelvis was performed using the standard protocol during bolus administration of intravenous contrast. RADIATION DOSE REDUCTION: This exam was performed according to the departmental dose-optimization program which includes automated exposure control, adjustment of the mA and/or kV according to patient size and/or use of iterative reconstruction technique. CONTRAST:  59mL OMNIPAQUE IOHEXOL 350 MG/ML SOLN COMPARISON:  Chest radiograph 02/26/2022 and CT abdomen 12/14/2020 FINDINGS: Despite efforts by the technologist and patient, motion artifact is present on today's exam and could not be eliminated. This reduces exam sensitivity and specificity. CTA CHEST FINDINGS Cardiovascular: No filling defect is identified in the pulmonary arterial tree to suggest pulmonary embolus. Reduced sensitivity due to motion artifact. Coronary, aortic arch, and branch vessel atherosclerotic vascular disease. Mild cardiomegaly. Dual lead pacer noted. Mediastinum/Nodes: 1.7 cm hypodense nodule in the right thyroid lobe on image 17 series 2. In the setting of significant comorbidities or limited life expectancy, no follow-up  recommended (ref: J Am Coll Radiol. 2015 Feb;12(2): 143-50).No pathologic adenopathy. Lungs/Pleura: Airspace opacities in both lower lobes suspicious for multilobar pneumonia or aspiration pneumonitis. There is some faint airspace opacity peripherally in the right upper lobe on image 47 series 4. Musculoskeletal: Degenerative sternoclavicular arthropathy with some posterior subluxation of the right clavicle along the sternoclavicular joint as shown on image 37 series 4. Thoracic spondylosis. Review of the MIP images confirms the above findings. CT ABDOMEN and PELVIS FINDINGS Hepatobiliary: Unremarkable Pancreas: Atrophic pancreas with small scattered cystic lesions or cystic dilatation of the dorsal pancreatic duct. The largest such lesion is in the pancreatic head and measures 1.2 by  1.2 cm on image 34 series 4, stable based on my measurements compared to 12/14/2020. No progression or change since the prior exam. Spleen: Unremarkable Adrenals/Urinary Tract: Both adrenal glands appear normal. Bosniak category 1 cyst of the left kidney upper pole is benign and warrants no further workup. Urinary bladder and left distal ureter partially obscured by streak artifact from the left hip implant. Stomach/Bowel: Distended rectum and sigmoid colon, with mild associated wall thickening potentially reflecting distal colitis. The degree of wall thickening is less striking than on the 12/14/2020 exam although the distal colon is substantially more distended with frothy material down on that exam. Along the proximal margin of the dilated segment the proximal sigmoid colon demonstrates some twisting/swirling as on images 33 through 42 of series 6, along the margin of a diverticulum, but without luminal occlusion/obstruction or other significant indicator of a fulcrum for volvulus. Accordingly I favor a functional abnormality and/or distal colitis over sigmoid volvulus. Vascular/Lymphatic: Atheromatous narrowing of the proximal  celiac trunk without occlusion. There is also mild wall calcification in the proximal SMA. No pathologic adenopathy. Reproductive: Prostate gland partially obscured by streak artifact from the patient's left hip implant. Other: No substantial ascites.  Trace perirectal stranding. Musculoskeletal: Left hip hemiarthroplasty. Interdigitating articulation and likely partial fusion at L4-5 and likely partial fusion at L5-S1. Left foraminal impingement at L4-5 due to intervertebral and facet spurring. Probably fused facet joints at L4-5. Mild left foraminal stenosis at L3-4 due to facet arthropathy. Review of the MIP images confirms the above findings. IMPRESSION: 1. No filling defect is identified in the pulmonary arterial tree to suggest pulmonary embolus. Reduced sensitivity due to motion artifact. 2. Airspace opacities in both lower lobes suspicious for multilobar pneumonia or aspiration pneumonitis. There is also some faint airspace opacity peripherally in the right upper lobe. 3. Distended rectum and sigmoid colon, with mild associated wall thickening potentially reflecting distal colitis. The degree of wall thickening is less striking than on the 12/14/2020 exam. There is some twisting/swirling along the proximal margin of the dilated segment of the proximal sigmoid colon, but without luminal occlusion/obstruction or other significant indicator of a fulcrum for volvulus. Accordingly I favor a functional abnormality and/or distal colitis over sigmoid volvulus. 4. Atrophic pancreas with small scattered cystic lesions or cystic dilatation of the dorsal pancreatic duct. The largest such lesion is in the pancreatic head and measures 1.2 by 1.2 cm. No progression or change since the prior exam. Given the patient's age, follow up pancreatic protocol CT or MRI is recommended in 2 years time. 5. 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 Left foraminal impingement at L4-5 due to intervertebral  and facet spurring. 6. Aortic and coronary atherosclerosis. Aortic Atherosclerosis (ICD10-I70.0). Electronically Signed   By: Van Clines M.D.   On: 02/26/2022 11:38   DG Chest Port 1 View  Result Date: 02/26/2022 CLINICAL DATA:  Evaluate sepsis EXAM: PORTABLE CHEST 1 VIEW COMPARISON:  None Available. FINDINGS: LEFT-sided pacer overlies normal cardiac silhouette. No effusion, infiltrate or pneumothorax. No acute osseous abnormality. IMPRESSION: No evidence of pulmonary infection. Electronically Signed   By: Suzy Bouchard M.D.   On: 02/26/2022 09:36    Cardiac Studies   ECHO EF 35%  Patient Profile     88 y.o. male with type II myocardial infarction, troponin 1300 in the setting of bilateral pneumonia, chronic systolic heart failure ejection fraction 35%  Assessment & Plan    Type 2 myocardial infarction secondary to pneumonia Chronic systolic heart  failure - Currently on IV heparin which is reasonable over the next 24 hours.  Plan on discontinuing tomorrow. - Echocardiogram revealed ejection fraction in the 30 to 35% range fairly similar to prior echocardiogram. - Currently off of BiPAP resting comfortably.  Still with somewhat coarse breath sounds. - Add back aspirin 81 mg, carvedilol 3.125 mg twice a day, losartan 25 mg once a day when comfortable from a respiratory perspective.  Permanent pacemaker - Performing well.  No current evidence of atrial fibrillation. -[employer], Dr. Lovena Le.  Pneumonia - Leukocytosis noted, fever noted.  Getting IV antibiotics per primary team.  Continue with supportive care.  Elevated troponin portends a worsened overall prognosis increasing morbidity mortality.      For questions or updates, please contact [facility] Please consult [email or web address] for contact info under        Signed, Candee Furbish, MD  02/27/2022, 7:29 AM

## 2022-02-27 NOTE — Progress Notes (Signed)
PICC order received. Spoke with primary RN that PICC cannot be place today. Patient has 2 working PIVs.

## 2022-02-27 NOTE — Progress Notes (Signed)
RT NOTE:  Pt currently off BiPAP in no distress at this time, BiPAP on stand by.

## 2022-02-27 NOTE — Assessment & Plan Note (Signed)
PPI ?

## 2022-02-27 NOTE — Assessment & Plan Note (Addendum)
-  Patient noted to have elevated troponins, seen by cardiology and concern for type II MI secondary to pneumonia. -2D echo done with EF of 30 to 35% and currently off BiPAP. -Patient seen in consultation by cardiology recommending addition of aspirin, carvedilol, losartan once comfortable from her respiratory perspective. -Resumed home regimen Coreg,  -Cozaar resumed per cardiology and Aldactone added to patient's regimen. -Status post 48 hours of IV heparin which has subsequently been discontinued.  -Cardiology following and appreciate input and recommendations.

## 2022-02-27 NOTE — Progress Notes (Signed)
PROGRESS NOTE    Nathan Lad [name]  KGY:[id number] DOB: [birth year] DOA: 02/26/2022 PCP: Jacelyn Pi, NP    Chief Complaint  Patient presents with   Shortness of Breath    Brief Narrative:  HPI per Dr. York Grice [name] is a 88 y.o. male with medical history significant of DM2, GERD, HTN, HLD, SSS s/p PPM. Presenting with shortness of breath and cough. He reports that he's had shortness of breath for over a week. He was diagnosed with PNA in the outpatient setting and started on an abx. He is unable to tell me which one he had. This morning, he states his coughing got much worse. He didn't have any fever or sick contacts. When his symptoms did not improve, the staff at his ALF called for EMS. He denies any other aggravating or alleviating factors.    Workup done on admission including CT chest abdomen and pelvis concerning for multifocal pneumonia and sigmoid colitis. -Patient placed empirically on IV antibiotics. -Patient also noted on admission to have low CBGs and placed on a D10 drip. -Patient noted to have elevated troponins, cardiology consulted and following.    Assessment & Plan:  Principal Problem:   Sepsis ([place]) Active Problems:   Colitis   Hypertension   Diabetes ([place])   Sinus node dysfunction ([facility])   Multifocal pneumonia   Elevated troponin   GERD (gastroesophageal reflux disease)    Assessment and Plan: * Sepsis ([place]) -Patient admitted and met criteria for sepsis with on admission with fever, tachycardia, leukocytosis with a white count of 21.2, CT chest concerning for multifocal pneumonia and colitis. -Blood cultures ordered and pending. -MRSA PCR negative.  COVID-19 PCR negative.  Influenza a and B PCR negative. -Urine cultures pending. -Check a GI pathogen panel. -Discontinue IV vancomycin. -Continue IV cefepime, IV Flagyl. -Supportive care.  Colitis -Stool studies for C. difficile pending. -Check a GI pathogen panel. -Continue empiric  IV cefepime and Flagyl.  GERD (gastroesophageal reflux disease) -PPI.  Elevated troponin -Patient noted to have elevated troponins, seen by cardiology and concern for type II MI secondary to pneumonia. -2D echo done with EF of 30 to 35% and currently off BiPAP. -Patient seen in consultation by cardiology recommending addition of aspirin, carvedilol, losartan 1 comfortable from her respiratory perspective. -Resume home regimen Coreg, continue to hold losartan. -Continue IV heparin drip for another 24 hours and could likely discontinue tomorrow per cardiology recommendations. -Cardiology following and appreciate input and recommendations.  Multifocal pneumonia -Noted on CT chest. -Patient presented with sepsis and CT chest concerning for multifocal pneumonia. -Patient with some rhonchorous gurgling sounds on examination. -Check a BNP. -Check a urine Legionella antigen, urine pneumococcus antigen. -Blood cultures pending. -MRSA PCR negative for nonsurgical discontinue vancomycin. -Continue IV cefepime.  IV Flagyl. -Increase Mucinex to 1200 mg twice daily. -Start Claritin, Flonase. -Continue PPI. -Lasix 20 mg IV every 12 hours x 2 doses.  Sinus node dysfunction (HCC) -Status post PPM. -BP initially noted to be soft on admission however improving. -Continue to hold Cozaar. -Resume Coreg. -Cardiology following.  Diabetes ([place]) -Hemoglobin A1c 6.0 (03/17/2021)_0 -Check a hemoglobin A1c. -Patient noted to have bouts of hypoglycemia and as such currently on a D10 drip which we will continue. -Advance diet to a dysphagia 3 diet pending speech evaluation.  Hypertension -Blood pressure noted to be soft on admission and this morning and as such patient ordered a dose of IV albumin. -Blood pressure now in the systolics of 026V to 785Y. -Concern  for possible volume overload and as such we will give Lasix 20 mg IV every 12 hours x 2 doses. -Continue to hold home regimen Cozaar. -Resume  Coreg. -If BP remains elevated in the next 24 hours despite IV diuretics will resume home regimen antihypertensive medications.         DVT prophylaxis: Heparin drip Code Status: Partial Family Communication: Updated patient.  No family at bedside. Disposition: TBD/likely back to assisted living facility versus SNF.  Status is: Inpatient Remains inpatient appropriate because: Severity of illness   Consultants:  Cardiology: Dr. Marlou Porch 02/26/2022  Procedures:  CT abdomen and pelvis 02/26/2022 CT angiogram chest 02/26/2022  Antimicrobials:  IV cefepime 02/26/2022>> IV Flagyl 02/26/2022>>>> IV vancomycin 02/26/2022>>> 02/27/2022    Subjective: Patient sitting up in bed.  RN trying to place IV with difficulty.  Patient states some improvement with shortness of breath and cough since admission.  Overall states he is feeling better.  Denies any chest pain.  No abdominal pain.  Patient also noted to be hypoglycemic overnight requiring D10 drip.  Objective: Vitals:   02/27/22 1300 02/27/22 1400 02/27/22 1416 02/27/22 1500  BP: 110/70 128/70  (!) 159/71  Pulse: 83 90 80 89  Resp: (!) 27 (!) 31 (!) 26 (!) 29  Temp:      TempSrc:      SpO2: 96% 96% 90% 98%  Weight:      Height:        Intake/Output Summary (Last 24 hours) at 02/27/2022 1556 Last data filed at 02/27/2022 1500 Gross per 24 hour  Intake 3231.82 ml  Output 1549 ml  Net 1682.82 ml   Filed Weights   02/26/22 0919 02/26/22 1355 02/27/22 1112  Weight: 58 kg 59.2 kg 61.2 kg    Examination:  General exam: Appears calm and comfortable  Respiratory system: Diffuse coarse rhonchorous breath sounds.  No wheezing.  Fair air movement.  Cardiovascular system: S1 & S2 heard, RRR. No JVD, murmurs, rubs, gallops or clicks. No pedal edema. Gastrointestinal system: Abdomen is nondistended, soft and nontender. No organomegaly or masses felt. Normal bowel sounds heard. Central nervous system: Alert and oriented. No focal  neurological deficits. Extremities: Symmetric 5 x 5 power. Skin: No rashes, lesions or ulcers Psychiatry: Judgement and insight appear normal. Mood & affect appropriate.     Data Reviewed:   CBC: Recent Labs  Lab 02/26/22 0904 02/27/22 0020 02/27/22 1029  WBC 21.2* 19.6* 21.3*  NEUTROABS 16.8*  --   --   HGB 12.7* 10.3* 10.8*  HCT 39.9 31.7* 33.5*  MCV 94.5 93.2 93.3  PLT 323 187 008    Basic Metabolic Panel: Recent Labs  Lab 02/26/22 0904 02/27/22 0020  NA 137 137  K 4.7 4.0  CL 105 110  CO2 23 22  GLUCOSE 228* 91  BUN 49* 39*  CREATININE 1.04 0.88  CALCIUM 9.0 7.9*  MG 2.1  --     GFR: Estimated Creatinine Clearance: 44.4 mL/min (by C-G formula based on SCr of 0.88 mg/dL).  Liver Function Tests: Recent Labs  Lab 02/26/22 0904 02/27/22 0020  AST 19 22  ALT 12 9  ALKPHOS 63 43  BILITOT 0.4 0.7  PROT 8.1 5.5*  ALBUMIN 4.0 2.8*    CBG: Recent Labs  Lab 02/27/22 1022 02/27/22 1132 02/27/22 1250 02/27/22 1409 02/27/22 1450  GLUCAP 67* 88 71 62* 74     Recent Results (from the past 240 hour(s))  Blood Culture (routine x 2)     Status:  None (Preliminary result)   Collection Time: 02/26/22  9:09 AM   Specimen: BLOOD  Result Value Ref Range Status   Specimen Description   Final    BLOOD SITE NOT SPECIFIED Performed at [facility] [address], [place], [place] [address]    Special Requests   Final    BOTTLES DRAWN AEROBIC AND ANAEROBIC Blood Culture adequate volume Performed at [facility] [address], [place], [place] [address]    Culture   Final    NO GROWTH 1 DAY Performed at [facility], [place] [address], [place], [place] [address]    Report Status PENDING  Incomplete  Blood Culture (routine x 2)     Status: None (Preliminary result)   Collection Time: 02/26/22  9:23 AM   Specimen: BLOOD RIGHT ARM  Result Value Ref Range Status   Specimen Description   Final    BLOOD RIGHT  ARM Performed at [facility], [place] [address], [place], [place] [address]    Special Requests   Final    BOTTLES DRAWN AEROBIC AND ANAEROBIC Blood Culture results may not be optimal due to an excessive volume of blood received in culture bottles Performed at [facility] [address] [place], [place] [address]    Culture   Final    NO GROWTH 1 DAY Performed at [facility], [place] [address] [place],  [address]    Report Status PENDING  Incomplete  Resp Panel by RT-PCR (Flu A&B, Covid) Anterior Nasal Swab     Status: None   Collection Time: 02/26/22  9:52 AM   Specimen: Anterior Nasal Swab  Result Value Ref Range Status   SARS Coronavirus 2 by RT PCR NEGATIVE NEGATIVE Final    Comment: (NOTE) SARS-CoV-2 target nucleic acids are NOT DETECTED.  The SARS-CoV-2 RNA is generally detectable in upper respiratory specimens during the acute phase of infection. The lowest concentration of SARS-CoV-2 viral copies this assay can detect is 138 copies/mL. A negative result does not preclude SARS-Cov-2 infection and should not be used as the sole basis for treatment or other patient management decisions. A negative result may occur with  improper specimen collection/handling, submission of specimen other than nasopharyngeal swab, presence of viral mutation(s) within the areas targeted by this assay, and inadequate number of viral copies(<138 copies/mL). A negative result must be combined with clinical observations, patient history, and epidemiological information. The expected result is Negative.  Fact Sheet for Patients:  EntrepreneurPulse.com.au  Fact Sheet for Healthcare Providers:  IncredibleEmployment.be  This test is no t yet approved or cleared by the [place] FDA and  has been authorized for detection and/or diagnosis of SARS-CoV-2 by FDA under an Emergency Use Authorization (EUA). This EUA will remain   in effect (meaning this test can be used) for the duration of the COVID-19 declaration under Section 564(b)(1) of the Act, 21 U.S.C.section 360bbb-3(b)(1), unless the authorization is terminated  or revoked sooner.       Influenza A by PCR NEGATIVE NEGATIVE Final   Influenza B by PCR NEGATIVE NEGATIVE Final    Comment: (NOTE) The Xpert Xpress SARS-CoV-2/FLU/RSV plus assay is intended as an aid in the diagnosis of influenza from Nasopharyngeal swab specimens and should not be used as a sole basis for treatment. Nasal washings and aspirates are unacceptable for Xpert Xpress SARS-CoV-2/FLU/RSV testing.  Fact Sheet for Patients: EntrepreneurPulse.com.au  Fact Sheet for Healthcare Providers: IncredibleEmployment.be  This  test is not yet approved or cleared by the [place] and has been authorized for detection and/or diagnosis of SARS-CoV-2 by FDA under an Emergency Use Authorization (EUA). This EUA will remain in effect (meaning this test can be used) for the duration of the COVID-19 declaration under Section 564(b)(1) of the Act, 21 U.S.C. section 360bbb-3(b)(1), unless the authorization is terminated or revoked.  Performed at [facility], [place] [address] [place], [place] [address]   MRSA Next Gen by PCR, Nasal     Status: None   Collection Time: 02/26/22  1:32 PM   Specimen: Nasal Mucosa; Nasal Swab  Result Value Ref Range Status   MRSA by PCR Next Gen NOT DETECTED NOT DETECTED Final    Comment: (NOTE) The GeneXpert MRSA Assay (FDA approved for NASAL specimens only), is one component of a comprehensive MRSA colonization surveillance program. It is not intended to diagnose MRSA infection nor to guide or monitor treatment for MRSA infections. Test performance is not FDA approved in patients less than 60 years old. Performed at [facility], [place] [address] [place], [place] [address]           Radiology Studies: [place] EKG SITE RITE  Result Date: 02/27/2022 If Site Rite image not attached, placement could not be confirmed due to current cardiac rhythm.  ECHOCARDIOGRAM COMPLETE  Result Date: 02/26/2022    ECHOCARDIOGRAM REPORT   Patient Name:   Nathan Pretty [name] Date of Exam: 02/26/2022 Medical Rec #:  [id number]          Height:       67.0 in Accession #:    [id number]         Weight:       127.9 lb Date of Birth:  [birth year]         BSA:          1.672 m Patient Age:    45 years           BP:           105/66 mmHg Patient Gender: M                  HR:           80 bpm. Exam Location:  Inpatient Procedure: 2D Echo and Intracardiac Opacification Agent Indications:    elevated troponin  History:        Patient has prior history of Echocardiogram examinations, most                 recent 03/20/2021. Pacemaker; Risk Factors:Diabetes and                 Hypertension.  Sonographer:    Harvie Junior Referring Phys: [id number] Jonnie Finner  Sonographer Comments: Technically difficult study due to poor echo windows, echo performed with patient supine and on artificial respirator and no subcostal window. IMPRESSIONS  1. Left ventricular ejection fraction, by estimation, is 30 to 35%. The left ventricle has moderately decreased function. The left ventricle demonstrates global hypokinesis. There is severe asymmetric left ventricular hypertrophy of the basal-septal segment. Left ventricular diastolic parameters are consistent with Grade I diastolic dysfunction (impaired relaxation).  2. Right ventricular systolic function is normal. The right ventricular size is mildly enlarged. There is mildly elevated pulmonary artery systolic pressure.  3. The mitral valve is grossly normal. No evidence of mitral valve regurgitation. No evidence of mitral stenosis.  4. The aortic valve was not well  visualized. Aortic valve regurgitation is not visualized. Comparison(s): Prior images reviewed side by side. Similar  LVEF- this study is more technically difficult that prior. FINDINGS  Left Ventricle: Left ventricular ejection fraction, by estimation, is 30 to 35%. The left ventricle has moderately decreased function. The left ventricle demonstrates global hypokinesis. Definity contrast agent was given IV to delineate the left ventricular endocardial borders. The left ventricular internal cavity size was normal in size. There is severe asymmetric left ventricular hypertrophy of the basal-septal segment. Left ventricular diastolic parameters are consistent with Grade I diastolic dysfunction (impaired relaxation). Right Ventricle: The right ventricular size is mildly enlarged. No increase in right ventricular wall thickness. Right ventricular systolic function is normal. There is mildly elevated pulmonary artery systolic pressure. The tricuspid regurgitant velocity is 2.92 m/s, and with an assumed right atrial pressure of 3 mmHg, the estimated right ventricular systolic pressure is 01.0 mmHg. Left Atrium: Left atrial size was normal in size. Right Atrium: Right atrial size was normal in size. Pericardium: There is no evidence of pericardial effusion. Presence of epicardial fat layer. Mitral Valve: The mitral valve is grossly normal. No evidence of mitral valve regurgitation. No evidence of mitral valve stenosis. Tricuspid Valve: The tricuspid valve is normal in structure. Tricuspid valve regurgitation is not demonstrated. No evidence of tricuspid stenosis. Aortic Valve: The aortic valve was not well visualized. Aortic valve regurgitation is not visualized. Aortic regurgitation PHT measures 767 msec. Aortic valve mean gradient measures 5.0 mmHg. Aortic valve peak gradient measures 8.6 mmHg. Pulmonic Valve: The pulmonic valve was not well visualized. Pulmonic valve regurgitation is not visualized. No evidence of pulmonic stenosis. Aorta: The aortic root is normal in size and structure. IAS/Shunts: No atrial level shunt detected by  color flow Doppler.  LEFT VENTRICLE PLAX 2D LVIDd:         4.40 cm      Diastology LVIDs:         3.50 cm      LV e' medial:    4.79 cm/s LV PW:         1.00 cm      LV E/e' medial:  7.2 LV IVS:        1.00 cm      LV e' lateral:   6.64 cm/s                             LV E/e' lateral: 5.2  LV Volumes (MOD) LV vol d, MOD A2C: 102.0 ml LV vol d, MOD A4C: 81.1 ml LV vol s, MOD A2C: 61.2 ml LV vol s, MOD A4C: 60.6 ml LV SV MOD A2C:     40.8 ml LV SV MOD A4C:     81.1 ml LV SV MOD BP:      30.5 ml RIGHT VENTRICLE RV Basal diam:  4.40 cm RV Mid diam:    3.90 cm TAPSE (M-mode): 2.0 cm LEFT ATRIUM             Index        RIGHT ATRIUM           Index LA diam:        3.70 cm 2.21 cm/m   RA Area:     15.40 cm LA Vol (A2C):   47.8 ml 28.59 ml/m  RA Volume:   41.00 ml  24.52 ml/m LA Vol (A4C):   31.5 ml 18.84 ml/m LA Biplane Vol: 37.4 ml 22.37  ml/m  AORTIC VALVE                    PULMONIC VALVE AV Vmax:           147.00 cm/s  PV Vmax:       0.77 m/s AV Vmean:          101.000 cm/s PV Peak grad:  2.4 mmHg AV VTI:            0.229 m AV Peak Grad:      8.6 mmHg AV Mean Grad:      5.0 mmHg LVOT Vmax:         88.80 cm/s LVOT Vmean:        63.200 cm/s LVOT VTI:          0.159 m LVOT/AV VTI ratio: 0.69 AI PHT:            767 msec MITRAL VALVE               TRICUSPID VALVE MV Area (PHT): 2.34 cm    TR Peak grad:   34.1 mmHg MV Decel Time: 324 msec    TR Vmax:        292.00 cm/s MR Peak grad: 50.3 mmHg MR Vmax:      354.67 cm/s  SHUNTS MV E velocity: 34.70 cm/s  Systemic VTI: 0.16 m MV A velocity: 75.80 cm/s MV E/A ratio:  0.46 Rudean Haskell MD Electronically signed by Rudean Haskell MD Signature Date/Time: 02/26/2022/4:27:14 PM    Final    CT Angio Chest PE W and/or Wo Contrast  Result Date: 02/26/2022 CLINICAL DATA:  Sepsis. Shortness of breath. Recent antibiotic therapy for pneumonia. Hypoxia. EXAM: CT ANGIOGRAPHY CHEST CT ABDOMEN AND PELVIS WITH CONTRAST TECHNIQUE: Multidetector CT imaging of the chest was  performed using the standard protocol during bolus administration of intravenous contrast. Multiplanar CT image reconstructions and MIPs were obtained to evaluate the vascular anatomy. Multidetector CT imaging of the abdomen and pelvis was performed using the standard protocol during bolus administration of intravenous contrast. RADIATION DOSE REDUCTION: This exam was performed according to the departmental dose-optimization program which includes automated exposure control, adjustment of the mA and/or kV according to patient size and/or use of iterative reconstruction technique. CONTRAST:  48m OMNIPAQUE IOHEXOL 350 MG/ML SOLN COMPARISON:  Chest radiograph 02/26/2022 and CT abdomen 12/14/2020 FINDINGS: Despite efforts by the technologist and patient, motion artifact is present on today's exam and could not be eliminated. This reduces exam sensitivity and specificity. CTA CHEST FINDINGS Cardiovascular: No filling defect is identified in the pulmonary arterial tree to suggest pulmonary embolus. Reduced sensitivity due to motion artifact. Coronary, aortic arch, and branch vessel atherosclerotic vascular disease. Mild cardiomegaly. Dual lead pacer noted. Mediastinum/Nodes: 1.7 cm hypodense nodule in the right thyroid lobe on image 17 series 2. In the setting of significant comorbidities or limited life expectancy, no follow-up recommended (ref: J Am Coll Radiol. 2015 Feb;12(2): 143-50).No pathologic adenopathy. Lungs/Pleura: Airspace opacities in both lower lobes suspicious for multilobar pneumonia or aspiration pneumonitis. There is some faint airspace opacity peripherally in the right upper lobe on image 47 series 4. Musculoskeletal: Degenerative sternoclavicular arthropathy with some posterior subluxation of the right clavicle along the sternoclavicular joint as shown on image 37 series 4. Thoracic spondylosis. Review of the MIP images confirms the above findings. CT ABDOMEN and PELVIS FINDINGS Hepatobiliary:  Unremarkable Pancreas: Atrophic pancreas with small scattered cystic lesions or cystic dilatation of the dorsal pancreatic duct. The largest  such lesion is in the pancreatic head and measures 1.2 by 1.2 cm on image 34 series 4, stable based on my measurements compared to 12/14/2020. No progression or change since the prior exam. Spleen: Unremarkable Adrenals/Urinary Tract: Both adrenal glands appear normal. Bosniak category 1 cyst of the left kidney upper pole is benign and warrants no further workup. Urinary bladder and left distal ureter partially obscured by streak artifact from the left hip implant. Stomach/Bowel: Distended rectum and sigmoid colon, with mild associated wall thickening potentially reflecting distal colitis. The degree of wall thickening is less striking than on the 12/14/2020 exam although the distal colon is substantially more distended with frothy material down on that exam. Along the proximal margin of the dilated segment the proximal sigmoid colon demonstrates some twisting/swirling as on images 33 through 42 of series 6, along the margin of a diverticulum, but without luminal occlusion/obstruction or other significant indicator of a fulcrum for volvulus. Accordingly I favor a functional abnormality and/or distal colitis over sigmoid volvulus. 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 There is also mild wall calcification in the proximal SMA. No pathologic adenopathy. Reproductive: Prostate gland partially obscured by streak artifact from the patient's left hip implant. Other: No substantial ascites.  Trace perirectal stranding. Musculoskeletal: Left hip hemiarthroplasty. Interdigitating articulation and likely partial fusion at L4-5 and likely partial fusion at L5-S1. Left foraminal impingement at L4-5 due to intervertebral and facet spurring. Probably fused facet joints at L4-5. Mild left foraminal stenosis at L3-4 due to facet arthropathy.  Review of the MIP images confirms the above findings. IMPRESSION: 1. No filling defect is identified in the pulmonary arterial tree to suggest pulmonary embolus. Reduced sensitivity due to motion artifact. 2. Airspace opacities in both lower lobes suspicious for multilobar pneumonia or aspiration pneumonitis. There is also some faint airspace opacity peripherally in the right upper lobe. 3. Distended rectum and sigmoid colon, with mild associated wall thickening potentially reflecting distal colitis. The degree of wall thickening is less striking than on the 12/14/2020 exam. There is some twisting/swirling along the proximal margin of the dilated segment of the proximal sigmoid colon, but without luminal occlusion/obstruction or other significant indicator of a fulcrum for volvulus. Accordingly I favor a functional abnormality and/or distal colitis over sigmoid volvulus. 4. Atrophic pancreas with small scattered cystic lesions or cystic dilatation of the dorsal pancreatic duct. The largest such lesion is in the pancreatic head and measures 1.2 by 1.2 cm. No progression or change since the prior exam. Given the patient's age, follow up pancreatic protocol CT or MRI is recommended in 2 years time. 5. 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 Left foraminal impingement at L4-5 due to intervertebral and facet spurring. 6. Aortic and coronary atherosclerosis. Aortic Atherosclerosis (ICD10-I70.0). Electronically Signed   By: Van Clines M.D.   On: 02/26/2022 11:38   CT ABDOMEN PELVIS W CONTRAST  Result Date: 02/26/2022 CLINICAL DATA:  Sepsis. Shortness of breath. Recent antibiotic therapy for pneumonia. Hypoxia. EXAM: CT ANGIOGRAPHY CHEST CT ABDOMEN AND PELVIS WITH CONTRAST TECHNIQUE: Multidetector CT imaging of the chest was performed using the standard protocol during bolus administration of intravenous contrast. Multiplanar CT image reconstructions and MIPs were obtained to  evaluate the vascular anatomy. Multidetector CT imaging of the abdomen and pelvis was performed using the standard protocol during bolus administration of intravenous contrast. RADIATION DOSE REDUCTION: This exam was performed according to the departmental dose-optimization program which includes automated exposure control, adjustment of the  mA and/or kV according to patient size and/or use of iterative reconstruction technique. CONTRAST:  23m OMNIPAQUE IOHEXOL 350 MG/ML SOLN COMPARISON:  Chest radiograph 02/26/2022 and CT abdomen 12/14/2020 FINDINGS: Despite efforts by the technologist and patient, motion artifact is present on today's exam and could not be eliminated. This reduces exam sensitivity and specificity. CTA CHEST FINDINGS Cardiovascular: No filling defect is identified in the pulmonary arterial tree to suggest pulmonary embolus. Reduced sensitivity due to motion artifact. Coronary, aortic arch, and branch vessel atherosclerotic vascular disease. Mild cardiomegaly. Dual lead pacer noted. Mediastinum/Nodes: 1.7 cm hypodense nodule in the right thyroid lobe on image 17 series 2. In the setting of significant comorbidities or limited life expectancy, no follow-up recommended (ref: J Am Coll Radiol. 2015 Feb;12(2): 143-50).No pathologic adenopathy. Lungs/Pleura: Airspace opacities in both lower lobes suspicious for multilobar pneumonia or aspiration pneumonitis. There is some faint airspace opacity peripherally in the right upper lobe on image 47 series 4. Musculoskeletal: Degenerative sternoclavicular arthropathy with some posterior subluxation of the right clavicle along the sternoclavicular joint as shown on image 37 series 4. Thoracic spondylosis. Review of the MIP images confirms the above findings. CT ABDOMEN and PELVIS FINDINGS Hepatobiliary: Unremarkable Pancreas: Atrophic pancreas with small scattered cystic lesions or cystic dilatation of the dorsal pancreatic duct. The largest such lesion is in  the pancreatic head and measures 1.2 by 1.2 cm on image 34 series 4, stable based on my measurements compared to 12/14/2020. No progression or change since the prior exam. Spleen: Unremarkable Adrenals/Urinary Tract: Both adrenal glands appear normal. Bosniak category 1 cyst of the left kidney upper pole is benign and warrants no further workup. Urinary bladder and left distal ureter partially obscured by streak artifact from the left hip implant. Stomach/Bowel: Distended rectum and sigmoid colon, with mild associated wall thickening potentially reflecting distal colitis. The degree of wall thickening is less striking than on the 12/14/2020 exam although the distal colon is substantially more distended with frothy material down on that exam. Along the proximal margin of the dilated segment the proximal sigmoid colon demonstrates some twisting/swirling as on images 33 through 42 of series 6, along the margin of a diverticulum, but without luminal occlusion/obstruction or other significant indicator of a fulcrum for volvulus. Accordingly I favor a functional abnormality and/or distal colitis over sigmoid volvulus. 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 There is also mild wall calcification in the proximal SMA. No pathologic adenopathy. Reproductive: Prostate gland partially obscured by streak artifact from the patient's left hip implant. Other: No substantial ascites.  Trace perirectal stranding. Musculoskeletal: Left hip hemiarthroplasty. Interdigitating articulation and likely partial fusion at L4-5 and likely partial fusion at L5-S1. Left foraminal impingement at L4-5 due to intervertebral and facet spurring. Probably fused facet joints at L4-5. Mild left foraminal stenosis at L3-4 due to facet arthropathy. Review of the MIP images confirms the above findings. IMPRESSION: 1. No filling defect is identified in the pulmonary arterial tree to suggest pulmonary  embolus. Reduced sensitivity due to motion artifact. 2. Airspace opacities in both lower lobes suspicious for multilobar pneumonia or aspiration pneumonitis. There is also some faint airspace opacity peripherally in the right upper lobe. 3. Distended rectum and sigmoid colon, with mild associated wall thickening potentially reflecting distal colitis. The degree of wall thickening is less striking than on the 12/14/2020 exam. There is some twisting/swirling along the proximal margin of the dilated segment of the proximal sigmoid colon, but without luminal occlusion/obstruction or other significant indicator  of a fulcrum for volvulus. Accordingly I favor a functional abnormality and/or distal colitis over sigmoid volvulus. 4. Atrophic pancreas with small scattered cystic lesions or cystic dilatation of the dorsal pancreatic duct. The largest such lesion is in the pancreatic head and measures 1.2 by 1.2 cm. No progression or change since the prior exam. Given the patient's age, follow up pancreatic protocol CT or MRI is recommended in 2 years time. 5. 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 Left foraminal impingement at L4-5 due to intervertebral and facet spurring. 6. Aortic and coronary atherosclerosis. Aortic Atherosclerosis (ICD10-I70.0). Electronically Signed   By: Van Clines M.D.   On: 02/26/2022 11:38   DG Chest Port 1 View  Result Date: 02/26/2022 CLINICAL DATA:  Evaluate sepsis EXAM: PORTABLE CHEST 1 VIEW COMPARISON:  None Available. FINDINGS: LEFT-sided pacer overlies normal cardiac silhouette. No effusion, infiltrate or pneumothorax. No acute osseous abnormality. IMPRESSION: No evidence of pulmonary infection. Electronically Signed   By: Suzy Bouchard M.D.   On: 02/26/2022 09:36        Scheduled Meds:  Chlorhexidine Gluconate Cloth  6 each Topical Q0600   fluticasone  2 spray Each Nare Daily   furosemide  20 mg Intravenous Q12H   guaiFENesin  1,200 mg  Oral BID   loratadine  10 mg Oral Daily   mouth rinse  15 mL Mouth Rinse 4 times per day   pantoprazole  40 mg Oral Daily   Continuous Infusions:  albumin human     ceFEPime (MAXIPIME) IV Stopped (02/27/22 1026)   dextrose 20 mL/hr at 02/27/22 1500   heparin 950 Units/hr (02/27/22 1500)   metronidazole Stopped (02/27/22 1131)     LOS: 1 day    Time spent: 40 minutes    Irine Seal, MD [facility]   To contact the attending provider between 7A-7P or the covering provider during after hours 7P-7A, please log into the web site [email or web address] and access using universal [place] password for that web site. If you do not have the password, please call the hospital operator.  02/27/2022, 3:56 PM

## 2022-02-27 NOTE — Hospital Course (Signed)
HPI per Dr. Joellyn Rued [name] is a 88 y.o. male with medical history significant of DM2, GERD, HTN, HLD, SSS s/p PPM. Presenting with shortness of breath and cough. He reports that he's had shortness of breath for over a week. He was diagnosed with PNA in the outpatient setting and started on an abx. He is unable to tell me which one he had. This morning, he states his coughing got much worse. He didn't have any fever or sick contacts. When his symptoms did not improve, the staff at his ALF called for EMS. He denies any other aggravating or alleviating factors.    Workup done on admission including CT chest abdomen and pelvis concerning for multifocal pneumonia and sigmoid colitis. -Patient placed empirically on IV antibiotics. -Patient also noted on admission to have low CBGs and placed on a D10 drip. -Patient noted to have elevated troponins, cardiology consulted and following.

## 2022-02-27 NOTE — Progress Notes (Signed)
Hypoglycemic Event  CBG: 62  Treatment: D50 25 mL (12.5 gm)  Symptoms: None  Follow-up CBG: Time:1445 CBG Result:74  Possible Reasons for Event: Inadequate meal intake  Comments/MD notified: Ramiro Harvest, MD    Laurian Brim

## 2022-02-27 NOTE — Progress Notes (Signed)
PHARMACY NOTE:  ANTIMICROBIAL RENAL DOSAGE ADJUSTMENT  Current antimicrobial regimen includes a mismatch between antimicrobial dosage and estimated renal function.  As per policy approved by the Pharmacy & Therapeutics and Medical Executive Committees, the antimicrobial dosage will be adjusted accordingly.  Current antimicrobial dosage:  cefepime 2g IV q24  Indication: PNA + colitis  Renal Function:  Estimated Creatinine Clearance: 43 mL/min (by C-G formula based on SCr of 0.88 mg/dL). []      On intermittent HD, scheduled: []      On CRRT    Antimicrobial dosage has been changed to:  cefepime 2g IV q12  Additional comments:   Thank you for allowing pharmacy to be a part of this patient's care.  , [facility] 02/27/2022 9:30 AM

## 2022-02-27 NOTE — Assessment & Plan Note (Addendum)
-  Blood pressure noted to be soft on admission and in the morning of 02/27/2022, and as such patient ordered a dose of IV albumin. -Blood pressure noted to be elevated in the 170s to 180s on 02/27/2022 mid morning to afternoon.  -Due to concerns for volume overload patient placed on Lasix 20 mg IV every 12 hours x 2 doses with a good urine output.   -BNP noted to be elevated.   -Coreg resumed. -Patient started back on Cozaar and Aldactone per cardiology. -Increase Cozaar to 50 mg daily. -Defer up titration of medications to cardiology.

## 2022-02-27 NOTE — Assessment & Plan Note (Addendum)
-  Stool studies for C. difficile negative -GI pathogen panel negative. -Was on IV cefepime and Flagyl and transitioned to oral Flagyl.   -Changed from IV cefepime to oral Omnicef to complete course of antibiotic treatment.   -Contact precautions discontinued.

## 2022-02-27 NOTE — Progress Notes (Addendum)
       CROSS COVER NOTE  NAME: Nathan Millet [name] MRN: [id number] DOB : February 17, 1925    Date of Service   02/27/2022   HPI/Events of Note   Patient has had 2 hypoglycemic episodes requiring amps of D50.    Patient is already receiving D5 LR at 100 cc/h.  Fluids have been changed to D10 at 75 cc/h with every 2 hour CBG checks.   0445: Glucose dropping to 78. D10 rate inc to 100 cc/ hr.   RN mentioned that nursing home staff reported that the pt has also been having hypoglycemic episodes at their facility.   Labs ordered: A1c, TSH, cortisol   Interventions/ Plan   D10 solution Follow up on labs       Nathan Harada, DNP, [facility]

## 2022-02-27 NOTE — Assessment & Plan Note (Addendum)
-  Noted on CT chest. -Patient presented with sepsis and CT chest concerning for multifocal pneumonia. -Patient with improving rhonchorous gurgling sounds on examination. -BNP noted to be elevated. -Urine Legionella antigen pending, urine pneumococcus antigen pending.  -Blood cultures pending.  -MRSA PCR negative for/vancomycin discontinued.   -Continue Mucinex 1200 twice daily, Claritin, Flonase. -Changed IV Flagyl to oral Flagyl. -Discontinue IV cefepime and placed on Omnicef to complete course of antibiotic treatment. -Continue PPI. -Status post Lasix 20 mg IV every 12 hours x 2 doses with good urine output and clinical improvement.

## 2022-02-27 NOTE — Progress Notes (Signed)
ANTICOAGULATION CONSULT NOTE  Pharmacy Consult for IV heparin Indication: chest pain/ACS  Allergies  Allergen Reactions   Penicillins Other (See Comments)    Reaction is not listed on [facility]    Patient Measurements: Height: 5\' 7"  (170.2 cm) Weight: 59.2 kg (130 lb 8.2 oz) IBW/kg (Calculated) : 66.1 Heparin Dosing Weight: TBW  Vital Signs: Temp: 98.2 F (36.8 C) (11/22 2300) Temp Source: Axillary (11/22 2300) BP: 111/59 (11/23 0100) Pulse Rate: 67 (11/23 0100)  Labs: Recent Labs    02/26/22 0904 02/26/22 1350 02/27/22 0020  HGB 12.7*  --  10.3*  HCT 39.9  --  31.7*  PLT 323  --  187  LABPROT 13.7  --  16.8*  INR 1.1  --  1.4*  HEPARINUNFRC  --   --  0.13*  CREATININE 1.04  --  0.88  TROPONINIHS 57* 1,387*  --      Estimated Creatinine Clearance: 43 mL/min (by C-G formula based on SCr of 0.88 mg/dL).   Medications:  Medications Prior to Admission  Medication Sig Dispense Refill Last Dose   acetaminophen (TYLENOL) 325 MG tablet Take 2 tablets (650 mg total) by mouth every 6 (six) hours as needed for mild pain (or Fever >/= 101). (Patient taking differently: Take 650 mg by mouth every 6 (six) hours as needed for mild pain.) 12 tablet 1 unk   aspirin EC 81 MG EC tablet Take 1 tablet (81 mg total) by mouth daily with breakfast. Swallow whole. 30 tablet 11 02/25/2022   Calcium Carbonate 500 MG CHEW Chew 500 mg by mouth in the morning and at bedtime.   02/25/2022   carvedilol (COREG) 3.125 MG tablet Take 3.125 mg by mouth 2 (two) times daily with a meal.   02/25/2022 at 2000   Cholecalciferol (VITAMIN D3) 50 MCG (2000 UT) TABS Take 2,000 Units by mouth daily.   02/25/2022   Control Gel Formula Dressing (DUODERM CGF DRESSING EX) Apply 1 Application topically once a week. Apply to sacrum wound. May also change as needed when dressing is soiled.   02/21/2022   ferrous sulfate (FEROSUL) 325 (65 FE) MG tablet Take 325 mg by mouth daily.   02/25/2022   insulin detemir (LEVEMIR  FLEXTOUCH) 100 UNIT/ML FlexPen Inject 25 Units into the skin in the morning.   02/26/2022   insulin lispro (HUMALOG KWIKPEN) 100 UNIT/ML KwikPen Inject 5 Units into the skin 2 (two) times daily as needed (for BS >450).   unk   losartan (COZAAR) 25 MG tablet Take 1 tablet (25 mg total) by mouth daily. 30 tablet 1 02/25/2022   metFORMIN (GLUCOPHAGE) 500 MG tablet Take 500 mg by mouth in the morning and at bedtime.   02/25/2022   nystatin ointment (MYCOSTATIN) Apply 1 Application topically 2 (two) times daily. To groin area   02/26/2022   PARoxetine (PAXIL) 10 MG tablet Take 10 mg by mouth daily.   02/25/2022   Skin Protectants, Misc. (BAZA PROTECT EX) Apply 1 application  topically See admin instructions. Apply to affected areas of buttocks, sacrum, and genitals twice daily. May also apply as needed for each incontinence episode   02/26/2022   UNABLE TO FIND Take 1 Tube by mouth as needed (BS less than 60 or signs of hypoglycemia). Med Name: glucose gel   02/25/2022   vitamin B-12 (CYANOCOBALAMIN) 1000 MCG tablet Take 2,000 mcg by mouth daily.   02/25/2022   calcium carbonate (OS-CAL - DOSED IN MG OF ELEMENTAL CALCIUM) 1250 (500 Ca) MG tablet  Take 1 tablet (500 mg of elemental calcium total) by mouth 2 (two) times daily with a meal. (Patient not taking: Reported on 02/26/2022) 60 tablet 1 Not Taking   carvedilol (COREG) 6.25 MG tablet Take 1 tablet (6.25 mg total) by mouth 2 (two) times daily. For BP and Heart (Patient not taking: Reported on 02/26/2022) 180 tablet 3 Not Taking   doxycycline (VIBRAMYCIN) 100 MG capsule Take 1 capsule (100 mg total) by mouth 2 (two) times daily. One po bid x 7 days (Patient not taking: Reported on 02/26/2022) 14 capsule 0 Not Taking   JANUVIA 50 MG tablet Take 1 tablet (50 mg total) by mouth daily. (Patient not taking: Reported on 07/22/2021) 30 tablet 1 Not Taking   Nystatin (GERHARDT'S BUTT CREAM) CREA Apply 1 application topically 2 (two) times daily. Each shift and prn  incontinence. (Patient not taking: Reported on 07/22/2021) 1 each 0 Not Taking   Scheduled:   Chlorhexidine Gluconate Cloth  6 each Topical Q0600   dextrose       guaiFENesin  600 mg Oral BID   insulin aspart  0-5 Units Subcutaneous QHS   insulin aspart  0-9 Units Subcutaneous TID WC   mouth rinse  15 mL Mouth Rinse 4 times per day   pantoprazole  40 mg Oral Daily   PRN: acetaminophen **OR** acetaminophen, albuterol, dextrose, mouth rinse  Assessment: 81 yoM with PMH DM2, HTN, HLD, SSS w/ PPM, presents 11/22 w/ sepsis d/t multifocal PNA and colitis. Found to have elevated troponins, rising significantly on recheck. Pharmacy consulted to dose IV heparin for ACS.  Prior anticoagulation: none; ASA only  Significant events:  Today, 02/27/2022: Initial heparin level 0.13- subtherapeutic on IV heparin 750 units/hr CBC: Hgb 10.3- low; Plt 187.  Noted drop in pltc from 323 on admission >>187.  Will monitor closely for trends/significance. SCr WNL No bleeding or infusion issues per nursing  Goal of Therapy: Heparin level 0.3-0.7 units/ml Monitor platelets by anticoagulation protocol: Yes  Plan: Re-bolus Heparin 2000 units IV x 1 Increase Heparin 950 units/hr IV infusion Check heparin level 8 hrs after rate increase.   Recheck pltc with heparin level given previous drop Daily CBC, daily heparin level once stable Monitor for signs of bleeding or thrombosis  Netta Cedars, PharmD, BCPS 02/27/2022, 1:42 AM

## 2022-02-27 NOTE — Assessment & Plan Note (Addendum)
-  Hemoglobin A1c 6.0 (03/17/2021)\ -Repeat hemoglobin A1c at 6.4 (02/27/2022). -Patient noted to have bouts of hypoglycemia and as such was on a D10 drip. -Patient started on a dysphagia 3 diet, CBGs/hypoglycemia improving and as such D10 changed to D5 normal saline.  -D5 normal saline discontinued, CBG noted to elevated to 402 last night.   -Started on long-acting Semglee at decreased home dose of 15 units daily.   -SSI.

## 2022-02-27 NOTE — Assessment & Plan Note (Addendum)
-  Status post PPM. -BP initially noted to be soft on admission however improving. -Coreg resumed.   -Patient placed back on home regimen Cozaar and Aldactone added per cardiology.  -Cardiology following.

## 2022-02-27 NOTE — Progress Notes (Signed)
ANTICOAGULATION CONSULT NOTE  Pharmacy Consult for IV heparin Indication: chest pain/ACS  Allergies  Allergen Reactions   Penicillins Other (See Comments)    Reaction is not listed on [facility]    Patient Measurements: Height: 5\' 7"  (170.2 cm) Weight: 59.2 kg (130 lb 8.2 oz) IBW/kg (Calculated) : 66.1 Heparin Dosing Weight: TBW  Vital Signs: Temp: 98.5 F (36.9 C) (11/23 0830) Temp Source: Axillary (11/23 0830) BP: 195/120 (11/23 1100) Pulse Rate: 85 (11/23 1100)  Labs: Recent Labs    02/26/22 0904 02/26/22 1350 02/27/22 0020 02/27/22 1029  HGB 12.7*  --  10.3* 10.8*  HCT 39.9  --  31.7* 33.5*  PLT 323  --  187 212  LABPROT 13.7  --  16.8*  --   INR 1.1  --  1.4*  --   HEPARINUNFRC  --   --  0.13* 0.33  CREATININE 1.04  --  0.88  --   TROPONINIHS 57* 1,387*  --   --      Estimated Creatinine Clearance: 43 mL/min (by C-G formula based on SCr of 0.88 mg/dL).   Medications:  Medications Prior to Admission  Medication Sig Dispense Refill Last Dose   acetaminophen (TYLENOL) 325 MG tablet Take 2 tablets (650 mg total) by mouth every 6 (six) hours as needed for mild pain (or Fever >/= 101). (Patient taking differently: Take 650 mg by mouth every 6 (six) hours as needed for mild pain.) 12 tablet 1 unk   aspirin EC 81 MG EC tablet Take 1 tablet (81 mg total) by mouth daily with breakfast. Swallow whole. 30 tablet 11 02/25/2022   Calcium Carbonate 500 MG CHEW Chew 500 mg by mouth in the morning and at bedtime.   02/25/2022   carvedilol (COREG) 3.125 MG tablet Take 3.125 mg by mouth 2 (two) times daily with a meal.   02/25/2022 at 2000   Cholecalciferol (VITAMIN D3) 50 MCG (2000 UT) TABS Take 2,000 Units by mouth daily.   02/25/2022   Control Gel Formula Dressing (DUODERM CGF DRESSING EX) Apply 1 Application topically once a week. Apply to sacrum wound. May also change as needed when dressing is soiled.   02/21/2022   ferrous sulfate (FEROSUL) 325 (65 FE) MG tablet Take 325 mg by  mouth daily.   02/25/2022   insulin detemir (LEVEMIR FLEXTOUCH) 100 UNIT/ML FlexPen Inject 25 Units into the skin in the morning.   02/26/2022   insulin lispro (HUMALOG KWIKPEN) 100 UNIT/ML KwikPen Inject 5 Units into the skin 2 (two) times daily as needed (for BS >450).   unk   losartan (COZAAR) 25 MG tablet Take 1 tablet (25 mg total) by mouth daily. 30 tablet 1 02/25/2022   metFORMIN (GLUCOPHAGE) 500 MG tablet Take 500 mg by mouth in the morning and at bedtime.   02/25/2022   nystatin ointment (MYCOSTATIN) Apply 1 Application topically 2 (two) times daily. To groin area   02/26/2022   PARoxetine (PAXIL) 10 MG tablet Take 10 mg by mouth daily.   02/25/2022   Skin Protectants, Misc. (BAZA PROTECT EX) Apply 1 application  topically See admin instructions. Apply to affected areas of buttocks, sacrum, and genitals twice daily. May also apply as needed for each incontinence episode   02/26/2022   UNABLE TO FIND Take 1 Tube by mouth as needed (BS less than 60 or signs of hypoglycemia). Med Name: glucose gel   02/25/2022   vitamin B-12 (CYANOCOBALAMIN) 1000 MCG tablet Take 2,000 mcg by mouth daily.  02/25/2022   calcium carbonate (OS-CAL - DOSED IN MG OF ELEMENTAL CALCIUM) 1250 (500 Ca) MG tablet Take 1 tablet (500 mg of elemental calcium total) by mouth 2 (two) times daily with a meal. (Patient not taking: Reported on 02/26/2022) 60 tablet 1 Not Taking   carvedilol (COREG) 6.25 MG tablet Take 1 tablet (6.25 mg total) by mouth 2 (two) times daily. For BP and Heart (Patient not taking: Reported on 02/26/2022) 180 tablet 3 Not Taking   doxycycline (VIBRAMYCIN) 100 MG capsule Take 1 capsule (100 mg total) by mouth 2 (two) times daily. One po bid x 7 days (Patient not taking: Reported on 02/26/2022) 14 capsule 0 Not Taking   JANUVIA 50 MG tablet Take 1 tablet (50 mg total) by mouth daily. (Patient not taking: Reported on 07/22/2021) 30 tablet 1 Not Taking   Nystatin (GERHARDT'S BUTT CREAM) CREA Apply 1  application topically 2 (two) times daily. Each shift and prn incontinence. (Patient not taking: Reported on 07/22/2021) 1 each 0 Not Taking   Scheduled:   Chlorhexidine Gluconate Cloth  6 each Topical Q0600   fluticasone  2 spray Each Nare Daily   furosemide  20 mg Intravenous Q12H   guaiFENesin  1,200 mg Oral BID   loratadine  10 mg Oral Daily   mouth rinse  15 mL Mouth Rinse 4 times per day   pantoprazole  40 mg Oral Daily   PRN: acetaminophen **OR** acetaminophen, albuterol, mouth rinse  Assessment: 46 yoM with PMH DM2, HTN, HLD, SSS w/ PPM, presents 11/22 w/ sepsis d/t multifocal PNA and colitis. Found to have elevated troponins, rising significantly on recheck. Pharmacy consulted to dose IV heparin for ACS. Prior anticoagulation: none; ASA only  Significant events:  Today, 02/27/2022: Heparin level now therapeutic after rebolus and increase in IV heparin rate to 950 units/hr early this AM CBC: stable SCr WNL No bleeding or infusion issues per nursing  Goal of Therapy: Heparin level 0.3-0.7 units/ml Monitor platelets by anticoagulation protocol: Yes  Plan: Continue IV heparin at current rate of 950 units/hr Daily CBC, daily heparin level Monitor for signs of bleeding or thrombosis Noted plan per cards note to stop IV heparin tomorrow 11/24   Adrian Saran, PharmD, BCPS Secure Chat if ?s 02/27/2022 11:38 AM

## 2022-02-27 NOTE — Assessment & Plan Note (Addendum)
-  Patient admitted and met criteria for sepsis with on admission with fever, tachycardia, leukocytosis with a white count of 21.2, CT chest concerning for multifocal pneumonia and colitis. -Blood cultures ordered and pending. -MRSA PCR negative.  COVID-19 PCR negative.  Influenza A and B PCR negative. -Urine cultures negative. -Blood cultures negative with no growth to date. -GI pathogen panel negative. -Fever curve trending down. -Leukocytosis trending down. -IV vancomycin has been discontinued. -Patient transition from IV Flagyl to oral Flagyl on 03/02/2022. -Discontinue IV cefepime and placed on Omnicef for 3-4 more days to complete a course of antibiotic treatment. -Supportive care.

## 2022-02-28 ENCOUNTER — Encounter (HOSPITAL_COMMUNITY): Payer: Self-pay | Admitting: Internal Medicine

## 2022-02-28 ENCOUNTER — Inpatient Hospital Stay: Payer: Self-pay

## 2022-02-28 DIAGNOSIS — I5022 Chronic systolic (congestive) heart failure: Secondary | ICD-10-CM

## 2022-02-28 DIAGNOSIS — I21A1 Myocardial infarction type 2: Secondary | ICD-10-CM

## 2022-02-28 DIAGNOSIS — R7989 Other specified abnormal findings of blood chemistry: Secondary | ICD-10-CM | POA: Diagnosis not present

## 2022-02-28 DIAGNOSIS — K529 Noninfective gastroenteritis and colitis, unspecified: Secondary | ICD-10-CM | POA: Diagnosis not present

## 2022-02-28 DIAGNOSIS — A419 Sepsis, unspecified organism: Secondary | ICD-10-CM | POA: Diagnosis not present

## 2022-02-28 DIAGNOSIS — J189 Pneumonia, unspecified organism: Secondary | ICD-10-CM | POA: Diagnosis not present

## 2022-02-28 LAB — HEPARIN LEVEL (UNFRACTIONATED): Heparin Unfractionated: 0.2 IU/mL — ABNORMAL LOW (ref 0.30–0.70)

## 2022-02-28 LAB — HEMOGLOBIN A1C
Hgb A1c MFr Bld: 6.2 % — ABNORMAL HIGH (ref 4.8–5.6)
Hgb A1c MFr Bld: 6.4 % — ABNORMAL HIGH (ref 4.8–5.6)
Mean Plasma Glucose: 131 mg/dL
Mean Plasma Glucose: 137 mg/dL

## 2022-02-28 LAB — COMPREHENSIVE METABOLIC PANEL
ALT: 11 U/L (ref 0–44)
AST: 31 U/L (ref 15–41)
Albumin: 2.9 g/dL — ABNORMAL LOW (ref 3.5–5.0)
Alkaline Phosphatase: 52 U/L (ref 38–126)
Anion gap: 12 (ref 5–15)
BUN: 29 mg/dL — ABNORMAL HIGH (ref 8–23)
CO2: 18 mmol/L — ABNORMAL LOW (ref 22–32)
Calcium: 7.9 mg/dL — ABNORMAL LOW (ref 8.9–10.3)
Chloride: 104 mmol/L (ref 98–111)
Creatinine, Ser: 0.98 mg/dL (ref 0.61–1.24)
GFR, Estimated: >60 mL/min (ref >60)
Glucose, Bld: 189 mg/dL — ABNORMAL HIGH (ref 70–99)
Potassium: 3.8 mmol/L (ref 3.5–5.1)
Sodium: 134 mmol/L — ABNORMAL LOW (ref 135–145)
Total Bilirubin: 0.6 mg/dL (ref 0.3–1.2)
Total Protein: 6.1 g/dL — ABNORMAL LOW (ref 6.5–8.1)

## 2022-02-28 LAB — URINE CULTURE: Culture: NO GROWTH

## 2022-02-28 LAB — GLUCOSE, CAPILLARY
Glucose-Capillary: 165 mg/dL — ABNORMAL HIGH (ref 70–99)
Glucose-Capillary: 179 mg/dL — ABNORMAL HIGH (ref 70–99)
Glucose-Capillary: 190 mg/dL — ABNORMAL HIGH (ref 70–99)
Glucose-Capillary: 192 mg/dL — ABNORMAL HIGH (ref 70–99)
Glucose-Capillary: 196 mg/dL — ABNORMAL HIGH (ref 70–99)
Glucose-Capillary: 201 mg/dL — ABNORMAL HIGH (ref 70–99)
Glucose-Capillary: 254 mg/dL — ABNORMAL HIGH (ref 70–99)

## 2022-02-28 LAB — CBC WITH DIFFERENTIAL/PLATELET
Abs Immature Granulocytes: 0.09 10*3/uL — ABNORMAL HIGH (ref 0.00–0.07)
Basophils Absolute: 0.1 10*3/uL (ref 0.0–0.1)
Basophils Relative: 1 %
Eosinophils Absolute: 0.2 10*3/uL (ref 0.0–0.5)
Eosinophils Relative: 1 %
HCT: 32.6 % — ABNORMAL LOW (ref 39.0–52.0)
Hemoglobin: 10.7 g/dL — ABNORMAL LOW (ref 13.0–17.0)
Immature Granulocytes: 1 %
Lymphocytes Relative: 12 %
Lymphs Abs: 2.2 10*3/uL (ref 0.7–4.0)
MCH: 30.2 pg (ref 26.0–34.0)
MCHC: 32.8 g/dL (ref 30.0–36.0)
MCV: 92.1 fL (ref 80.0–100.0)
Monocytes Absolute: 1.5 10*3/uL — ABNORMAL HIGH (ref 0.1–1.0)
Monocytes Relative: 8 %
Neutro Abs: 15.1 10*3/uL — ABNORMAL HIGH (ref 1.7–7.7)
Neutrophils Relative %: 77 %
Platelets: 201 10*3/uL (ref 150–400)
RBC: 3.54 MIL/uL — ABNORMAL LOW (ref 4.22–5.81)
RDW: 14.6 % (ref 11.5–15.5)
WBC: 19.3 10*3/uL — ABNORMAL HIGH (ref 4.0–10.5)
nRBC: 0 % (ref 0.0–0.2)

## 2022-02-28 LAB — MAGNESIUM: Magnesium: 1.8 mg/dL (ref 1.7–2.4)

## 2022-02-28 LAB — BRAIN NATRIURETIC PEPTIDE: B Natriuretic Peptide: 783.1 pg/mL — ABNORMAL HIGH (ref 0.0–100.0)

## 2022-02-28 MED ORDER — ASPIRIN 81 MG PO TBEC
81.0000 mg | DELAYED_RELEASE_TABLET | Freq: Every day | ORAL | Status: DC
  Administered 2022-02-28 – 2022-03-05 (×6): 81 mg via ORAL
  Filled 2022-02-28 (×6): qty 1

## 2022-02-28 MED ORDER — CARVEDILOL 3.125 MG PO TABS
3.1250 mg | ORAL_TABLET | Freq: Two times a day (BID) | ORAL | Status: DC
  Administered 2022-02-28 – 2022-03-05 (×11): 3.125 mg via ORAL
  Filled 2022-02-28 (×11): qty 1

## 2022-02-28 MED ORDER — DEXTROSE-NACL 5-0.9 % IV SOLN: INTRAVENOUS | Status: DC

## 2022-02-28 MED ORDER — POTASSIUM CHLORIDE CRYS ER 20 MEQ PO TBCR
40.0000 meq | EXTENDED_RELEASE_TABLET | Freq: Once | ORAL | Status: AC
  Administered 2022-02-28: 40 meq via ORAL
  Filled 2022-02-28: qty 2

## 2022-02-28 NOTE — Progress Notes (Signed)
No need of bipap at this time. No resp distress noted. Bipap stand by if needed.

## 2022-02-28 NOTE — Evaluation (Signed)
Clinical/Bedside Swallow Evaluation Patient Details  Name: Nathan Alvarez. MRN: [id number] Date of Birth: May 28, 1924  Today's Date: 02/28/2022 Time: SLP Start Time (ACUTE ONLY): 0849 SLP Stop Time (ACUTE ONLY): 0927 SLP Time Calculation (min) (ACUTE ONLY): 38 min  Past Medical History:  Past Medical History:  Diagnosis Date   A-fib (HCC)    Dizziness    DM (diabetes mellitus) (HCC)    Esophagitis, reflux    Heart block    Hyperlipidemia    Hypertension, essential, benign    Memory deficit    Pathological fracture of left hip due to age-related osteoporosis (HCC) 04/25/2019   SOB (shortness of breath)    SSS (sick sinus syndrome) (HCC)    Past Surgical History:  Past Surgical History:  Procedure Laterality Date   CATARACT EXTRACTION Right    HIP ARTHROPLASTY Left 04/22/2019   Procedure: ARTHROPLASTY BIPOLAR HIP (HEMIARTHROPLASTY);  Surgeon: Myrene Galas, MD;  Location: [facility] OR;  Service: Orthopedics;  Laterality: Left;   INGUINAL HERNIA REPAIR     INSERT / REPLACE / REMOVE PACEMAKER     PACEMAKER INSERTION     PPM GENERATOR CHANGEOUT N/A 04/15/2021   Procedure: PPM GENERATOR CHANGEOUT;  Surgeon: Marinus Maw, MD;  Location: [facility] INVASIVE CV LAB;  Service: Cardiovascular;  Laterality: N/A;   TONSILLECTOMY AND ADENOIDECTOMY     HPI:  Nathan Alvarez. is a 88 y.o. male with medical history significant of DM2, GERD, HTN, HLD, SSS s/p PPM. Presenting with shortness of breath and cough. He reports that he's had shortness of breath for over a week. He was diagnosed with PNA in the outpatient setting and started on an abx. He is unable to tell me which one he had. This morning, he states his coughing got much worse.  Pt found to have pna, required Bipap. Swallow eval ordered.  He has h/o esophageal dysmotility, reflux and hiatal hernia. Pt denies dysphagia.    Assessment / Plan / Recommendation  Clinical Impression  Patient greeted up right in bed, polite and cooperative.  Observed  him taking his medications with water from his nurse.  He is observed to extend head upward - stating he has always done that.   Sensation of one pill sticking cleared with more liquids. No focal CN deficits and pt passed 3 ounce Yale. Observed pt consuming water, 3 ounces applesauce and 4 graham crackers  - no indication of aspiraton nor overt indication of esophageal deficits.  Pt eats his food and then consumes his liquids. No reflexive coughing noted during entire snack - pt did sound "junky" x2 during intake - clearing with cued cough. Due to his hearing loss, he did not hear congestion nor did he sense it.  SLP reviewed his prior esophagram with him, provided narrative in writng and reviewed esophageal precautions for dysmotlity using teach back.  All education completed for dysphagia mitigation.  No f/u needed.  Thanks for this consult. SLP Visit Diagnosis: Dysphagia, unspecified (R13.10)    Aspiration Risk  Mild aspiration risk    Diet Recommendation Regular;Thin liquid (to allow pt to choose food items he can manage)   Liquid Administration via: Cup;Straw Medication Administration: Whole meds with liquid Supervision: Patient able to self feed Compensations: Slow rate;Small sips/bites Postural Changes: Seated upright at 90 degrees;Remain upright for at least 30 minutes after po intake    Other  Recommendations Oral Care Recommendations: Oral care BID    Recommendations for follow up therapy are one component of a multi-disciplinary discharge  planning process, led by the attending physician.  Recommendations may be updated based on patient status, additional functional criteria and insurance authorization.  Follow up Recommendations No SLP follow up      Assistance Recommended at Discharge  none  Functional Status Assessment Patient has not had a recent decline in their functional status  Frequency and Duration   N/a         Prognosis     N/a   Swallow Study   General Date of  Onset: 02/28/22 HPI: Nathan Alvarez. is a 88 y.o. male with medical history significant of DM2, GERD, HTN, HLD, SSS s/p PPM. Presenting with shortness of breath and cough. He reports that he's had shortness of breath for over a week. He was diagnosed with PNA in the outpatient setting and started on an abx. He is unable to tell me which one he had. This morning, he states his coughing got much worse.  Pt found to have pna, required Bipap. Swallow eval ordered.  He has h/o esophageal dysmotility, reflux and hiatal hernia. Pt denies dysphagia. Type of Study: Bedside Swallow Evaluation Diet Prior to this Study: Thin liquids;Dysphagia 3 (soft) Temperature Spikes Noted: No Respiratory Status: Nasal cannula History of Recent Intubation: No Behavior/Cognition: Alert;Cooperative;Pleasant mood Oral Cavity Assessment: Within Functional Limits Oral Care Completed by SLP: No Oral Cavity - Dentition: Adequate natural dentition (has partials at home) Vision: Functional for self-feeding Self-Feeding Abilities: Able to feed self;Needs set up (left hand very arthritic) Patient Positioning: Upright in bed Baseline Vocal Quality: Low vocal intensity Volitional Cough: Strong Volitional Swallow: Able to elicit    Oral/Motor/Sensory Function Overall Oral Motor/Sensory Function: Within functional limits   Ice Chips Ice chips: Not tested   Thin Liquid Thin Liquid: Within functional limits Presentation: Cup;Self Fed;Straw    Nectar Thick Nectar Thick Liquid: Not tested   Honey Thick Honey Thick Liquid: Not tested   Puree Puree: Within functional limits Presentation: Self Fed;Spoon   Solid     Solid: Within functional limits Presentation: Self Fed      Nathan Alvarez 02/28/2022,10:26 AM Rolena Infante, MS [facility] SLP Acute Rehab Services Office [phone] Pager [phone]

## 2022-02-28 NOTE — Progress Notes (Signed)
PROGRESS NOTE    Nathan Bartunek [name]  IEP:[id number] DOB: 19-Dec-1924 DOA: 02/26/2022 PCP: Nathan Pi, NP    Chief Complaint  Patient presents with   Shortness of Breath    Brief Narrative:  HPI per Dr. York Grice [name] is a 88 y.o. male with medical history significant of DM2, GERD, HTN, HLD, SSS s/p PPM. Presenting with shortness of breath and cough. He reports that he's had shortness of breath for over a week. He was diagnosed with PNA in the outpatient setting and started on an abx. He is unable to tell me which one he had. This morning, he states his coughing got much worse. He didn't have any fever or sick contacts. When his symptoms did not improve, the staff at his ALF called for EMS. He denies any other aggravating or alleviating factors.    Workup done on admission including CT chest abdomen and pelvis concerning for multifocal pneumonia and sigmoid colitis. -Patient placed empirically on IV antibiotics. -Patient also noted on admission to have low CBGs and placed on a D10 drip. -Patient noted to have elevated troponins, cardiology consulted and following.    Assessment & Plan:  Principal Problem:   Sepsis ([place]) Active Problems:   Colitis   Hypertension   Diabetes ([place])   Sinus node dysfunction (HCC)   Multifocal pneumonia   Elevated troponin   GERD (gastroesophageal reflux disease)    Assessment and Plan: * Sepsis ([place]) -Patient admitted and met criteria for sepsis with on admission with fever, tachycardia, leukocytosis with a white count of 21.2, CT chest concerning for multifocal pneumonia and colitis. -Blood cultures ordered and pending. -MRSA PCR negative.  COVID-19 PCR negative.  Influenza A and B PCR negative. -Urine cultures pending. -Blood cultures pending.   -GI pathogen panel pending. -Fever curve trending down. -Leukocytosis slowly trending down. -IV vancomycin has been discontinued. -Continue IV cefepime, IV Flagyl.  -Supportive  care.  Colitis -Stool studies for C. difficile pending. -GI pathogen panel pending. -Continue empiric IV cefepime and Flagyl.  GERD (gastroesophageal reflux disease) -PPI.  Elevated troponin -Patient noted to have elevated troponins, seen by cardiology and concern for type II MI secondary to pneumonia. -2D echo done with EF of 30 to 35% and currently off BiPAP. -Patient seen in consultation by cardiology recommending addition of aspirin, carvedilol, losartan 1 comfortable from her respiratory perspective. -Resumed home regimen Coreg, continue to hold losartan. -Continue IV heparin drip and discontinue when okayed by cardiology.  -Cardiology following and appreciate input and recommendations.  Multifocal pneumonia -Noted on CT chest. -Patient presented with sepsis and CT chest concerning for multifocal pneumonia. -Patient with some rhonchorous gurgling sounds on examination. -BNP noted to be elevated. -Urine Legionella antigen pending, urine pneumococcus antigen pending.  -Blood cultures pending.  -MRSA PCR negative for/vancomycin discontinued.   -Continue IV cefepime.  IV Flagyl, Mucinex 1200 twice daily, Claritin, Flonase. -Continue PPI. -Status post Lasix 20 mg IV every 12 hours x 2 doses with good urine output and clinical improvement.  Sinus node dysfunction (HCC) -Status post PPM. -BP initially noted to be soft on admission however improving. -Continue to hold Cozaar. -Coreg resumed. -Cardiology following.  Diabetes ([place]) -Hemoglobin A1c 6.0 (03/17/2021)\ -Repeat hemoglobin A1c at 6.4 (02/27/2022). -Patient noted to have bouts of hypoglycemia and as such currently on a D10 drip. -Patient started on a dysphagia 3 diet, CBGs/hypoglycemia improving and as such we will change D10 drip to D5 normal saline for the next 24 hours and if  continued improvement could likely discontinue D5 normal saline in the next 24 hours.  Hypertension -Blood pressure noted to be soft on  admission and in the morning of 02/27/2022, and as such patient ordered a dose of IV albumin. -Blood pressure noted to be elevated in the 170s to 180s on 02/27/2022 mid morning to afternoon.  -Due to concerns for volume overload patient placed on Lasix 20 mg IV every 12 hours x 2 doses with a good urine output of 2.409 L over the past 24 hours.  -BNP noted to be elevated.   -Coreg resumed.   -Continue to hold home regimen Cozaar at this time following BP.         DVT prophylaxis: Heparin drip Code Status: Partial Family Communication: Updated patient.  No family at bedside. Disposition: TBD/likely back to assisted living facility versus SNF.  Status is: Inpatient Remains inpatient appropriate because: Severity of illness   Consultants:  Cardiology: Dr. Marlou Porch 02/26/2022  Procedures:  CT abdomen and pelvis 02/26/2022 CT angiogram chest 02/26/2022  Antimicrobials:  IV cefepime 02/26/2022>> IV Flagyl 02/26/2022>>>> IV vancomycin 02/26/2022>>> 02/27/2022    Subjective: Sitting up in bed.  Ate some of his breakfast.  Patient with complaints of poor appetite.  Still with shortness of breath with some improvement since admission.  States some improvement with cough.  Denies any abdominal pain.  Hypoglycemia improving.    Objective: Vitals:   02/28/22 0800 02/28/22 0847 02/28/22 0900 02/28/22 1000  BP: (!) 110/49  120/69 (!) 101/52  Pulse: (!) 43 72 85 90  Resp: (!) 28 (!) 32 19 (!) 33  Temp:      TempSrc:      SpO2: 95% 96% 95% 95%  Weight:      Height:        Intake/Output Summary (Last 24 hours) at 02/28/2022 1115 Last data filed at 02/28/2022 1038 Gross per 24 hour  Intake 1735.41 ml  Output 2309 ml  Net -573.59 ml   Filed Weights   02/26/22 1355 02/27/22 1112 02/28/22 0500  Weight: 59.2 kg 61.2 kg 60.5 kg    Examination:  General exam: NAD Respiratory system: Diffuse coarse rhonchorous breath sounds improving.  No wheezing.  Fair air movement.  Speaking  in full sentences.  Cardiovascular system: RRR no murmurs rubs or gallops.  No JVD.  No lower extremity edema.  Gastrointestinal system: Abdomen is soft, nontender, nondistended, positive bowel sounds.  No rebound.  No guarding.  Central nervous system: Alert and oriented. No focal neurological deficits. Extremities: Symmetric 5 x 5 power. Skin: No rashes, lesions or ulcers Psychiatry: Judgement and insight appear normal. Mood & affect appropriate.     Data Reviewed:   CBC: Recent Labs  Lab 02/26/22 0904 02/27/22 0020 02/27/22 1029 02/28/22 0251  WBC 21.2* 19.6* 21.3* 19.3*  NEUTROABS 16.8*  --   --  15.1*  HGB 12.7* 10.3* 10.8* 10.7*  HCT 39.9 31.7* 33.5* 32.6*  MCV 94.5 93.2 93.3 92.1  PLT 323 187 212 573    Basic Metabolic Panel: Recent Labs  Lab 02/26/22 0904 02/27/22 0020 02/28/22 0251  NA 137 137 134*  K 4.7 4.0 3.8  CL 105 110 104  CO2 23 22 18*  GLUCOSE 228* 91 189*  BUN 49* 39* 29*  CREATININE 1.04 0.88 0.98  CALCIUM 9.0 7.9* 7.9*  MG 2.1  --  1.8    GFR: Estimated Creatinine Clearance: 39.4 mL/min (by C-G formula based on SCr of 0.98 mg/dL).  Liver Function Tests:  Recent Labs  Lab 02/26/22 0904 02/27/22 0020 02/28/22 0251  AST _0 ALT _1 ALKPHOS 63 43 52  BILITOT 0.4 0.7 0.6  PROT 8.1 5.5* 6.1*  ALBUMIN 4.0 2.8* 2.9*    CBG: Recent Labs  Lab 02/27/22 1953 02/27/22 2354 02/28/22 0408 02/28/22 0550 02/28/22 0804  GLUCAP 134* 158* 201* 196* 179*     Recent Results (from the past 240 hour(s))  Blood Culture (routine x 2)     Status: None (Preliminary result)   Collection Time: 02/26/22  9:09 AM   Specimen: BLOOD  Result Value Ref Range Status   Specimen Description   Final    BLOOD SITE NOT SPECIFIED Performed at [facility] [address], [place], [place] [address]    Special Requests   Final    BOTTLES DRAWN AEROBIC AND ANAEROBIC Blood Culture adequate volume Performed at [facility] [address], [place], [place] [address]    Culture   Final    NO GROWTH 2 DAYS Performed at [facility] [address] [place], [place] [address]    Report Status PENDING  Incomplete  Blood Culture (routine x 2)     Status: None (Preliminary result)   Collection Time: 02/26/22  9:23 AM   Specimen: BLOOD RIGHT ARM  Result Value Ref Range Status   Specimen Description   Final    BLOOD RIGHT ARM Performed at [facility], [place] [address] [place], [place] [address]    Special Requests   Final    BOTTLES DRAWN AEROBIC AND ANAEROBIC Blood Culture results may not be optimal due to an excessive volume of blood received in culture bottles Performed at [facility] [address] [place], [place] [address]    Culture   Final    NO GROWTH 2 DAYS Performed at [facility] [address] [place], [place] [address]    Report Status PENDING  Incomplete  Resp Panel by RT-PCR (Flu A&B, Covid) Anterior Nasal Swab     Status: None   Collection Time: 02/26/22  9:52 AM   Specimen: Anterior Nasal Swab  Result Value Ref Range Status   SARS Coronavirus 2 by RT PCR NEGATIVE NEGATIVE Final    Comment: (NOTE) SARS-CoV-2 target nucleic acids are NOT DETECTED.  The SARS-CoV-2 RNA is generally detectable in upper respiratory specimens during the acute phase of infection. The lowest concentration of SARS-CoV-2 viral copies this assay can detect is 138 copies/mL. A negative result does not preclude SARS-Cov-2 infection and should not be used as the sole basis for treatment or other patient management decisions. A negative result may occur with  improper specimen collection/handling, submission of specimen other than nasopharyngeal swab, presence of viral mutation(s) within the areas targeted by this assay, and inadequate number of viral copies(<138 copies/mL). A negative result must be combined with clinical observations, patient history,  and epidemiological information. The expected result is Negative.  Fact Sheet for Patients:  EntrepreneurPulse.com.au  Fact Sheet for Healthcare Providers:  IncredibleEmployment.be  This test is no t yet approved or cleared by the [place] FDA and  has been authorized for detection and/or diagnosis of SARS-CoV-2 by FDA under an Emergency Use Authorization (EUA). This EUA will remain  in effect (meaning this test can be used) for the duration of the COVID-19 declaration under Section 564(b)(1) of the Act, 21 U.S.C.section 360bbb-3(b)(1), unless the authorization is terminated  or revoked sooner.       Influenza A by PCR NEGATIVE NEGATIVE Final   Influenza B by PCR NEGATIVE NEGATIVE Final    Comment: (NOTE) The Xpert Xpress SARS-CoV-2/FLU/RSV plus assay is intended as an aid in the diagnosis of influenza from Nasopharyngeal swab specimens and should not be used as a sole basis for treatment. Nasal washings and aspirates are unacceptable for Xpert Xpress SARS-CoV-2/FLU/RSV testing.  Fact Sheet for Patients: EntrepreneurPulse.com.au  Fact Sheet for Healthcare Providers: IncredibleEmployment.be  This test is not yet approved or cleared by the [place] FDA and has been authorized for detection and/or diagnosis of SARS-CoV-2 by FDA under an Emergency Use Authorization (EUA). This EUA will remain in effect (meaning this test can be used) for the duration of the COVID-19 declaration under Section 564(b)(1) of the Act, 21 U.S.C. section 360bbb-3(b)(1), unless the authorization is terminated or revoked.  Performed at [facility], [place] [address] [place], [place] [address]   MRSA Next Gen by PCR, Nasal     Status: None   Collection Time: 02/26/22  1:32 PM   Specimen: Nasal Mucosa; Nasal Swab  Result Value Ref Range Status   MRSA by PCR Next Gen NOT DETECTED NOT DETECTED Final     Comment: (NOTE) The GeneXpert MRSA Assay (FDA approved for NASAL specimens only), is one component of a comprehensive MRSA colonization surveillance program. It is not intended to diagnose MRSA infection nor to guide or monitor treatment for MRSA infections. Test performance is not FDA approved in patients less than 15 years old. Performed at [facility], [place] [address] [place], [place] [address]          Radiology Studies: [place] EKG SITE RITE  Result Date: 02/27/2022 If Site Rite image not attached, placement could not be confirmed due to current cardiac rhythm.  ECHOCARDIOGRAM COMPLETE  Result Date: 02/26/2022    ECHOCARDIOGRAM REPORT   Patient Name:   Nathan Caravello [name] Date of Exam: 02/26/2022 Medical Rec #:  [id number]          Height:       67.0 in Accession #:    [id number]         Weight:       127.9 lb Date of Birth:  1924/09/11         BSA:          1.672 m Patient Age:    63 years           BP:           105/66 mmHg Patient Gender: M                  HR:           80 bpm. Exam Location:  Inpatient Procedure: 2D Echo and Intracardiac Opacification Agent Indications:    elevated troponin  History:        Patient has prior history of Echocardiogram examinations, most                 recent 03/20/2021. Pacemaker; Risk Factors:Diabetes and                 Hypertension.  Sonographer:    Harvie Junior Referring Phys: [id number] Jonnie Finner  Sonographer Comments: Technically difficult study due to poor echo windows, echo performed with patient supine and on artificial respirator and no subcostal window. IMPRESSIONS  1. Left ventricular ejection fraction, by estimation, is 30 to 35%.  The left ventricle has moderately decreased function. The left ventricle demonstrates global hypokinesis. There is severe asymmetric left ventricular hypertrophy of the basal-septal segment. Left ventricular diastolic parameters are consistent with Grade I diastolic dysfunction (impaired  relaxation).  2. Right ventricular systolic function is normal. The right ventricular size is mildly enlarged. There is mildly elevated pulmonary artery systolic pressure.  3. The mitral valve is grossly normal. No evidence of mitral valve regurgitation. No evidence of mitral stenosis.  4. The aortic valve was not well visualized. Aortic valve regurgitation is not visualized. Comparison(s): Prior images reviewed side by side. Similar LVEF- this study is more technically difficult that prior. FINDINGS  Left Ventricle: Left ventricular ejection fraction, by estimation, is 30 to 35%. The left ventricle has moderately decreased function. The left ventricle demonstrates global hypokinesis. Definity contrast agent was given IV to delineate the left ventricular endocardial borders. The left ventricular internal cavity size was normal in size. There is severe asymmetric left ventricular hypertrophy of the basal-septal segment. Left ventricular diastolic parameters are consistent with Grade I diastolic dysfunction (impaired relaxation). Right Ventricle: The right ventricular size is mildly enlarged. No increase in right ventricular wall thickness. Right ventricular systolic function is normal. There is mildly elevated pulmonary artery systolic pressure. The tricuspid regurgitant velocity is 2.92 m/s, and with an assumed right atrial pressure of 3 mmHg, the estimated right ventricular systolic pressure is 72.5 mmHg. Left Atrium: Left atrial size was normal in size. Right Atrium: Right atrial size was normal in size. Pericardium: There is no evidence of pericardial effusion. Presence of epicardial fat layer. Mitral Valve: The mitral valve is grossly normal. No evidence of mitral valve regurgitation. No evidence of mitral valve stenosis. Tricuspid Valve: The tricuspid valve is normal in structure. Tricuspid valve regurgitation is not demonstrated. No evidence of tricuspid stenosis. Aortic Valve: The aortic valve was not well  visualized. Aortic valve regurgitation is not visualized. Aortic regurgitation PHT measures 767 msec. Aortic valve mean gradient measures 5.0 mmHg. Aortic valve peak gradient measures 8.6 mmHg. Pulmonic Valve: The pulmonic valve was not well visualized. Pulmonic valve regurgitation is not visualized. No evidence of pulmonic stenosis. Aorta: The aortic root is normal in size and structure. IAS/Shunts: No atrial level shunt detected by color flow Doppler.  LEFT VENTRICLE PLAX 2D LVIDd:         4.40 cm      Diastology LVIDs:         3.50 cm      LV e' medial:    4.79 cm/s LV PW:         1.00 cm      LV E/e' medial:  7.2 LV IVS:        1.00 cm      LV e' lateral:   6.64 cm/s                             LV E/e' lateral: 5.2  LV Volumes (MOD) LV vol d, MOD A2C: 102.0 ml LV vol d, MOD A4C: 81.1 ml LV vol s, MOD A2C: 61.2 ml LV vol s, MOD A4C: 60.6 ml LV SV MOD A2C:     40.8 ml LV SV MOD A4C:     81.1 ml LV SV MOD BP:      30.5 ml RIGHT VENTRICLE RV Basal diam:  4.40 cm RV Mid diam:    3.90 cm TAPSE (M-mode): 2.0 cm LEFT ATRIUM  Index        RIGHT ATRIUM           Index LA diam:        3.70 cm 2.21 cm/m   RA Area:     15.40 cm LA Vol (A2C):   47.8 ml 28.59 ml/m  RA Volume:   41.00 ml  24.52 ml/m LA Vol (A4C):   31.5 ml 18.84 ml/m LA Biplane Vol: 37.4 ml 22.37 ml/m  AORTIC VALVE                    PULMONIC VALVE AV Vmax:           147.00 cm/s  PV Vmax:       0.77 m/s AV Vmean:          101.000 cm/s PV Peak grad:  2.4 mmHg AV VTI:            0.229 m AV Peak Grad:      8.6 mmHg AV Mean Grad:      5.0 mmHg LVOT Vmax:         88.80 cm/s LVOT Vmean:        63.200 cm/s LVOT VTI:          0.159 m LVOT/AV VTI ratio: 0.69 AI PHT:            767 msec MITRAL VALVE               TRICUSPID VALVE MV Area (PHT): 2.34 cm    TR Peak grad:   34.1 mmHg MV Decel Time: 324 msec    TR Vmax:        292.00 cm/s MR Peak grad: 50.3 mmHg MR Vmax:      354.67 cm/s  SHUNTS MV E velocity: 34.70 cm/s  Systemic VTI: 0.16 m MV A velocity:  75.80 cm/s MV E/A ratio:  0.46 Rudean Haskell MD Electronically signed by Rudean Haskell MD Signature Date/Time: 02/26/2022/4:27:14 PM    Final         Scheduled Meds:  carvedilol  3.125 mg Oral BID WC   Chlorhexidine Gluconate Cloth  6 each Topical Q0600   fluticasone  2 spray Each Nare Daily   guaiFENesin  1,200 mg Oral BID   loratadine  10 mg Oral Daily   mouth rinse  15 mL Mouth Rinse 4 times per day   pantoprazole  40 mg Oral Daily   Continuous Infusions:  albumin human     ceFEPime (MAXIPIME) IV Stopped (02/28/22 0907)   dextrose 5 % and 0.9% NaCl     heparin 1,050 Units/hr (02/28/22 1038)   metronidazole Stopped (02/28/22 0955)     LOS: 2 days    Time spent: 40 minutes    Irine Seal, MD [facility]   To contact the attending provider between 7A-7P or the covering provider during after hours 7P-7A, please log into the web site [email or web address] and access using universal [place] password for that web site. If you do not have the password, please call the hospital operator.  02/28/2022, 11:15 AM

## 2022-02-28 NOTE — Progress Notes (Signed)
ANTICOAGULATION CONSULT NOTE  Pharmacy Consult for IV heparin Indication: chest pain/ACS  Allergies  Allergen Reactions   Penicillins Other (See Comments)    Reaction is not listed on [facility]    Patient Measurements: Height: 5\' 7"  (170.2 cm) Weight: 61.2 kg (134 lb 14.7 oz) IBW/kg (Calculated) : 66.1 Heparin Dosing Weight: TBW  Vital Signs: Temp: 98.1 F (36.7 C) (11/24 0000) Temp Source: Axillary (11/24 0000) BP: 99/69 (11/24 0409) Pulse Rate: 93 (11/24 0409)  Labs: Recent Labs    02/26/22 0904 02/26/22 1350 02/27/22 0020 02/27/22 1029 02/28/22 0251  HGB 12.7*  --  10.3* 10.8* 10.7*  HCT 39.9  --  31.7* 33.5* 32.6*  PLT 323  --  187 212 201  LABPROT 13.7  --  16.8*  --   --   INR 1.1  --  1.4*  --   --   HEPARINUNFRC  --   --  0.13* 0.33 0.20*  CREATININE 1.04  --  0.88  --  0.98  TROPONINIHS 57* 1,387*  --   --   --      Estimated Creatinine Clearance: 39.9 mL/min (by C-G formula based on SCr of 0.98 mg/dL).   Medications:  Medications Prior to Admission  Medication Sig Dispense Refill Last Dose   acetaminophen (TYLENOL) 325 MG tablet Take 2 tablets (650 mg total) by mouth every 6 (six) hours as needed for mild pain (or Fever >/= 101). (Patient taking differently: Take 650 mg by mouth every 6 (six) hours as needed for mild pain.) 12 tablet 1 unk   aspirin EC 81 MG EC tablet Take 1 tablet (81 mg total) by mouth daily with breakfast. Swallow whole. 30 tablet 11 02/25/2022   Calcium Carbonate 500 MG CHEW Chew 500 mg by mouth in the morning and at bedtime.   02/25/2022   carvedilol (COREG) 3.125 MG tablet Take 3.125 mg by mouth 2 (two) times daily with a meal.   02/25/2022 at 2000   Cholecalciferol (VITAMIN D3) 50 MCG (2000 UT) TABS Take 2,000 Units by mouth daily.   02/25/2022   Control Gel Formula Dressing (DUODERM CGF DRESSING EX) Apply 1 Application topically once a week. Apply to sacrum wound. May also change as needed when dressing is soiled.   02/21/2022    ferrous sulfate (FEROSUL) 325 (65 FE) MG tablet Take 325 mg by mouth daily.   02/25/2022   insulin detemir (LEVEMIR FLEXTOUCH) 100 UNIT/ML FlexPen Inject 25 Units into the skin in the morning.   02/26/2022   insulin lispro (HUMALOG KWIKPEN) 100 UNIT/ML KwikPen Inject 5 Units into the skin 2 (two) times daily as needed (for BS >450).   unk   losartan (COZAAR) 25 MG tablet Take 1 tablet (25 mg total) by mouth daily. 30 tablet 1 02/25/2022   metFORMIN (GLUCOPHAGE) 500 MG tablet Take 500 mg by mouth in the morning and at bedtime.   02/25/2022   nystatin ointment (MYCOSTATIN) Apply 1 Application topically 2 (two) times daily. To groin area   02/26/2022   PARoxetine (PAXIL) 10 MG tablet Take 10 mg by mouth daily.   02/25/2022   Skin Protectants, Misc. (BAZA PROTECT EX) Apply 1 application  topically See admin instructions. Apply to affected areas of buttocks, sacrum, and genitals twice daily. May also apply as needed for each incontinence episode   02/26/2022   UNABLE TO FIND Take 1 Tube by mouth as needed (BS less than 60 or signs of hypoglycemia). Med Name: glucose gel   02/25/2022  vitamin B-12 (CYANOCOBALAMIN) 1000 MCG tablet Take 2,000 mcg by mouth daily.   02/25/2022   calcium carbonate (OS-CAL - DOSED IN MG OF ELEMENTAL CALCIUM) 1250 (500 Ca) MG tablet Take 1 tablet (500 mg of elemental calcium total) by mouth 2 (two) times daily with a meal. (Patient not taking: Reported on 02/26/2022) 60 tablet 1 Not Taking   carvedilol (COREG) 6.25 MG tablet Take 1 tablet (6.25 mg total) by mouth 2 (two) times daily. For BP and Heart (Patient not taking: Reported on 02/26/2022) 180 tablet 3 Not Taking   doxycycline (VIBRAMYCIN) 100 MG capsule Take 1 capsule (100 mg total) by mouth 2 (two) times daily. One po bid x 7 days (Patient not taking: Reported on 02/26/2022) 14 capsule 0 Not Taking   JANUVIA 50 MG tablet Take 1 tablet (50 mg total) by mouth daily. (Patient not taking: Reported on 07/22/2021) 30 tablet 1 Not  Taking   Nystatin (GERHARDT'S BUTT CREAM) CREA Apply 1 application topically 2 (two) times daily. Each shift and prn incontinence. (Patient not taking: Reported on 07/22/2021) 1 each 0 Not Taking   Scheduled:   Chlorhexidine Gluconate Cloth  6 each Topical Q0600   fluticasone  2 spray Each Nare Daily   guaiFENesin  1,200 mg Oral BID   loratadine  10 mg Oral Daily   mouth rinse  15 mL Mouth Rinse 4 times per day   pantoprazole  40 mg Oral Daily   PRN: acetaminophen **OR** acetaminophen, albuterol, mouth rinse  Assessment: 37 yoM with PMH DM2, HTN, HLD, SSS w/ PPM, presents 11/22 w/ sepsis d/t multifocal PNA and colitis. Found to have elevated troponins, rising significantly on recheck. Pharmacy consulted to dose IV heparin for ACS. Prior anticoagulation: none; ASA only  Significant events:  Today, 02/28/2022: Heparin level = 0.2 (subtherapeutic) with heparin gtt @ 950 units/hr  CBC: stable SCr WNL No complicqations noted or infusion issues per nursing  Goal of Therapy: Heparin level 0.3-0.7 units/ml Monitor platelets by anticoagulation protocol: Yes  Plan: Increase IV heparin to 1050 units/hr Check heparin level in 8 hr (will d/c this lab if heparin therapy discontinued prior to 13:00) Daily CBC, daily heparin level Monitor for signs of bleeding or thrombosis Noted plan per cards note to stop IV heparin tomorrow 11/24   Leone Haven, PharmD 02/28/2022 4:34 AM

## 2022-02-28 NOTE — Progress Notes (Addendum)
Rounding Note    Patient Name: Nathan Nagle [name] Date of Encounter: 02/28/2022  [place] Cardiologist: Cristopher Peru, MD   Subjective   Patient denies chest pain, palpitations. Breathing improving   Inpatient Medications    Scheduled Meds:  Chlorhexidine Gluconate Cloth  6 each Topical Q0600   fluticasone  2 spray Each Nare Daily   guaiFENesin  1,200 mg Oral BID   loratadine  10 mg Oral Daily   mouth rinse  15 mL Mouth Rinse 4 times per day   pantoprazole  40 mg Oral Daily   Continuous Infusions:  albumin human     ceFEPime (MAXIPIME) IV Stopped (02/27/22 2150)   dextrose 25 mL/hr at 02/28/22 0735   heparin 1,050 Units/hr (02/28/22 0735)   metronidazole Stopped (02/27/22 2220)   PRN Meds: acetaminophen **OR** acetaminophen, albuterol, mouth rinse   Vital Signs    Vitals:   02/28/22 0409 02/28/22 0500 02/28/22 0602 02/28/22 0700  BP: 99/69 (!) 90/50 (!) 101/52 (!) 107/52  Pulse: 93 94 81 96  Resp: (!) 28 (!) 26 (!) 28 (!) 25  Temp:      TempSrc:    Axillary  SpO2: 97% 96% 97% 95%  Weight:  60.5 kg    Height:        Intake/Output Summary (Last 24 hours) at 02/28/2022 0751 Last data filed at 02/28/2022 0735 Gross per 24 hour  Intake 1835.3 ml  Output 2409 ml  Net -573.7 ml      02/28/2022    5:00 AM 02/27/2022   11:12 AM 02/26/2022    1:55 PM  Last 3 Weights  Weight (lbs) 133 lb 6.1 oz 134 lb 14.7 oz 130 lb 8.2 oz  Weight (kg) 60.5 kg 61.2 kg 59.2 kg      Telemetry    NSR with frequent PVCs, PACs. Occasional 4 beat runs of NSVT - Personally Reviewed  ECG    No new tracings since 11/22 - Personally Reviewed  Physical Exam   GEN: No acute distress.  Frail, elderly male  Neck: No JVD with head of bed elevated to 30 degrees  Cardiac: RRR, no murmurs, rubs, or gallops. Radial pulses 2+ bilaterally  Respiratory: Coarse breath sounds bilaterally  GI: Soft, nontender, non-distended  MS: No edema; No deformity. Neuro:  Nonfocal   Psych: Normal affect   Labs    High Sensitivity Troponin:   Recent Labs  Lab 02/26/22 0904 02/26/22 1350  TROPONINIHS 57* 1,387*     Chemistry Recent Labs  Lab 02/26/22 0904 02/27/22 0020 02/28/22 0251  NA 137 137 134*  K 4.7 4.0 3.8  CL 105 110 104  CO2 23 22 18*  GLUCOSE 228* 91 189*  BUN 49* 39* 29*  CREATININE 1.04 0.88 0.98  CALCIUM 9.0 7.9* 7.9*  MG 2.1  --  1.8  PROT 8.1 5.5* 6.1*  ALBUMIN 4.0 2.8* 2.9*  AST 19 22 31   ALT 12 9 11   ALKPHOS 63 43 52  BILITOT 0.4 0.7 0.6  GFRNONAA >60 >60 >60  ANIONGAP 9 5 12     Lipids No results for input(s): "CHOL", "TRIG", "HDL", "LABVLDL", "LDLCALC", "CHOLHDL" in the last 168 hours.  Hematology Recent Labs  Lab 02/27/22 0020 02/27/22 1029 02/28/22 0251  WBC 19.6* 21.3* 19.3*  RBC 3.40* 3.59* 3.54*  HGB 10.3* 10.8* 10.7*  HCT 31.7* 33.5* 32.6*  MCV 93.2 93.3 92.1  MCH 30.3 30.1 30.2  MCHC 32.5 32.2 32.8  RDW 14.6 14.6 14.6  PLT  187 212 201   Thyroid  Recent Labs  Lab 02/27/22 0638  TSH 1.740    BNP Recent Labs  Lab 02/26/22 0904 02/27/22 1025 02/28/22 0251  BNP 428.6* 1,443.2* 783.1*    DDimer No results for input(s): "DDIMER" in the last 168 hours.   Radiology    [place] EKG SITE RITE  Result Date: 02/27/2022 If Site Rite image not attached, placement could not be confirmed due to current cardiac rhythm.  ECHOCARDIOGRAM COMPLETE  Result Date: 02/26/2022    ECHOCARDIOGRAM REPORT   Patient Name:   Nathan Torrez [name] Date of Exam: 02/26/2022 Medical Rec #:  [id number]          Height:       67.0 in Accession #:    [id number]         Weight:       127.9 lb Date of Birth:  [birth year]         BSA:          1.672 m Patient Age:    88 years           BP:           105/66 mmHg Patient Gender: M                  HR:           80 bpm. Exam Location:  Inpatient Procedure: 2D Echo and Intracardiac Opacification Agent Indications:    elevated troponin  History:        Patient has prior history of Echocardiogram  examinations, most                 recent 03/20/2021. Pacemaker; Risk Factors:Diabetes and                 Hypertension.  Sonographer:    Harvie Junior Referring Phys: [id number] Jonnie Finner  Sonographer Comments: Technically difficult study due to poor echo windows, echo performed with patient supine and on artificial respirator and no subcostal window. IMPRESSIONS  1. Left ventricular ejection fraction, by estimation, is 30 to 35%. The left ventricle has moderately decreased function. The left ventricle demonstrates global hypokinesis. There is severe asymmetric left ventricular hypertrophy of the basal-septal segment. Left ventricular diastolic parameters are consistent with Grade I diastolic dysfunction (impaired relaxation).  2. Right ventricular systolic function is normal. The right ventricular size is mildly enlarged. There is mildly elevated pulmonary artery systolic pressure.  3. The mitral valve is grossly normal. No evidence of mitral valve regurgitation. No evidence of mitral stenosis.  4. The aortic valve was not well visualized. Aortic valve regurgitation is not visualized. Comparison(s): Prior images reviewed side by side. Similar LVEF- this study is more technically difficult that prior. FINDINGS  Left Ventricle: Left ventricular ejection fraction, by estimation, is 30 to 35%. The left ventricle has moderately decreased function. The left ventricle demonstrates global hypokinesis. Definity contrast agent was given IV to delineate the left ventricular endocardial borders. The left ventricular internal cavity size was normal in size. There is severe asymmetric left ventricular hypertrophy of the basal-septal segment. Left ventricular diastolic parameters are consistent with Grade I diastolic dysfunction (impaired relaxation). Right Ventricle: The right ventricular size is mildly enlarged. No increase in right ventricular wall thickness. Right ventricular systolic function is normal. There is mildly  elevated pulmonary artery systolic pressure. The tricuspid regurgitant velocity is 2.92 m/s, and with an assumed right atrial pressure of 3 mmHg, the estimated right ventricular systolic  pressure is 37.1 mmHg. Left Atrium: Left atrial size was normal in size. Right Atrium: Right atrial size was normal in size. Pericardium: There is no evidence of pericardial effusion. Presence of epicardial fat layer. Mitral Valve: The mitral valve is grossly normal. No evidence of mitral valve regurgitation. No evidence of mitral valve stenosis. Tricuspid Valve: The tricuspid valve is normal in structure. Tricuspid valve regurgitation is not demonstrated. No evidence of tricuspid stenosis. Aortic Valve: The aortic valve was not well visualized. Aortic valve regurgitation is not visualized. Aortic regurgitation PHT measures 767 msec. Aortic valve mean gradient measures 5.0 mmHg. Aortic valve peak gradient measures 8.6 mmHg. Pulmonic Valve: The pulmonic valve was not well visualized. Pulmonic valve regurgitation is not visualized. No evidence of pulmonic stenosis. Aorta: The aortic root is normal in size and structure. IAS/Shunts: No atrial level shunt detected by color flow Doppler.  LEFT VENTRICLE PLAX 2D LVIDd:         4.40 cm      Diastology LVIDs:         3.50 cm      LV e' medial:    4.79 cm/s LV PW:         1.00 cm      LV E/e' medial:  7.2 LV IVS:        1.00 cm      LV e' lateral:   6.64 cm/s                             LV E/e' lateral: 5.2  LV Volumes (MOD) LV vol d, MOD A2C: 102.0 ml LV vol d, MOD A4C: 81.1 ml LV vol s, MOD A2C: 61.2 ml LV vol s, MOD A4C: 60.6 ml LV SV MOD A2C:     40.8 ml LV SV MOD A4C:     81.1 ml LV SV MOD BP:      30.5 ml RIGHT VENTRICLE RV Basal diam:  4.40 cm RV Mid diam:    3.90 cm TAPSE (M-mode): 2.0 cm LEFT ATRIUM             Index        RIGHT ATRIUM           Index LA diam:        3.70 cm 2.21 cm/m   RA Area:     15.40 cm LA Vol (A2C):   47.8 ml 28.59 ml/m  RA Volume:   41.00 ml  24.52 ml/m  LA Vol (A4C):   31.5 ml 18.84 ml/m LA Biplane Vol: 37.4 ml 22.37 ml/m  AORTIC VALVE                    PULMONIC VALVE AV Vmax:           147.00 cm/s  PV Vmax:       0.77 m/s AV Vmean:          101.000 cm/s PV Peak grad:  2.4 mmHg AV VTI:            0.229 m AV Peak Grad:      8.6 mmHg AV Mean Grad:      5.0 mmHg LVOT Vmax:         88.80 cm/s LVOT Vmean:        63.200 cm/s LVOT VTI:          0.159 m LVOT/AV VTI ratio: 0.69 AI PHT:  767 msec MITRAL VALVE               TRICUSPID VALVE MV Area (PHT): 2.34 cm    TR Peak grad:   34.1 mmHg MV Decel Time: 324 msec    TR Vmax:        292.00 cm/s MR Peak grad: 50.3 mmHg MR Vmax:      354.67 cm/s  SHUNTS MV E velocity: 34.70 cm/s  Systemic VTI: 0.16 m MV A velocity: 75.80 cm/s MV E/A ratio:  0.46 Rudean Haskell MD Electronically signed by Rudean Haskell MD Signature Date/Time: 02/26/2022/4:27:14 PM    Final    CT Angio Chest PE W and/or Wo Contrast  Result Date: 02/26/2022 CLINICAL DATA:  Sepsis. Shortness of breath. Recent antibiotic therapy for pneumonia. Hypoxia. EXAM: CT ANGIOGRAPHY CHEST CT ABDOMEN AND PELVIS WITH CONTRAST TECHNIQUE: Multidetector CT imaging of the chest was performed using the standard protocol during bolus administration of intravenous contrast. Multiplanar CT image reconstructions and MIPs were obtained to evaluate the vascular anatomy. Multidetector CT imaging of the abdomen and pelvis was performed using the standard protocol during bolus administration of intravenous contrast. RADIATION DOSE REDUCTION: This exam was performed according to the departmental dose-optimization program which includes automated exposure control, adjustment of the mA and/or kV according to patient size and/or use of iterative reconstruction technique. CONTRAST:  22mL OMNIPAQUE IOHEXOL 350 MG/ML SOLN COMPARISON:  Chest radiograph 02/26/2022 and CT abdomen 12/14/2020 FINDINGS: Despite efforts by the technologist and patient, motion artifact is  present on today's exam and could not be eliminated. This reduces exam sensitivity and specificity. CTA CHEST FINDINGS Cardiovascular: No filling defect is identified in the pulmonary arterial tree to suggest pulmonary embolus. Reduced sensitivity due to motion artifact. Coronary, aortic arch, and branch vessel atherosclerotic vascular disease. Mild cardiomegaly. Dual lead pacer noted. Mediastinum/Nodes: 1.7 cm hypodense nodule in the right thyroid lobe on image 17 series 2. In the setting of significant comorbidities or limited life expectancy, no follow-up recommended (ref: J Am Coll Radiol. 2015 Feb;12(2): 143-50).No pathologic adenopathy. Lungs/Pleura: Airspace opacities in both lower lobes suspicious for multilobar pneumonia or aspiration pneumonitis. There is some faint airspace opacity peripherally in the right upper lobe on image 47 series 4. Musculoskeletal: Degenerative sternoclavicular arthropathy with some posterior subluxation of the right clavicle along the sternoclavicular joint as shown on image 37 series 4. Thoracic spondylosis. Review of the MIP images confirms the above findings. CT ABDOMEN and PELVIS FINDINGS Hepatobiliary: Unremarkable Pancreas: Atrophic pancreas with small scattered cystic lesions or cystic dilatation of the dorsal pancreatic duct. The largest such lesion is in the pancreatic head and measures 1.2 by 1.2 cm on image 34 series 4, stable based on my measurements compared to 12/14/2020. No progression or change since the prior exam. Spleen: Unremarkable Adrenals/Urinary Tract: Both adrenal glands appear normal. Bosniak category 1 cyst of the left kidney upper pole is benign and warrants no further workup. Urinary bladder and left distal ureter partially obscured by streak artifact from the left hip implant. Stomach/Bowel: Distended rectum and sigmoid colon, with mild associated wall thickening potentially reflecting distal colitis. The degree of wall thickening is less striking  than on the 12/14/2020 exam although the distal colon is substantially more distended with frothy material down on that exam. Along the proximal margin of the dilated segment the proximal sigmoid colon demonstrates some twisting/swirling as on images 33 through 42 of series 6, along the margin of a diverticulum, but without luminal occlusion/obstruction or other significant indicator  of a fulcrum for volvulus. Accordingly I favor a functional abnormality and/or distal colitis over sigmoid volvulus. 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 There is also mild wall calcification in the proximal SMA. No pathologic adenopathy. Reproductive: Prostate gland partially obscured by streak artifact from the patient's left hip implant. Other: No substantial ascites.  Trace perirectal stranding. Musculoskeletal: Left hip hemiarthroplasty. Interdigitating articulation and likely partial fusion at L4-5 and likely partial fusion at L5-S1. Left foraminal impingement at L4-5 due to intervertebral and facet spurring. Probably fused facet joints at L4-5. Mild left foraminal stenosis at L3-4 due to facet arthropathy. Review of the MIP images confirms the above findings. IMPRESSION: 1. No filling defect is identified in the pulmonary arterial tree to suggest pulmonary embolus. Reduced sensitivity due to motion artifact. 2. Airspace opacities in both lower lobes suspicious for multilobar pneumonia or aspiration pneumonitis. There is also some faint airspace opacity peripherally in the right upper lobe. 3. Distended rectum and sigmoid colon, with mild associated wall thickening potentially reflecting distal colitis. The degree of wall thickening is less striking than on the 12/14/2020 exam. There is some twisting/swirling along the proximal margin of the dilated segment of the proximal sigmoid colon, but without luminal occlusion/obstruction or other significant indicator of a fulcrum for  volvulus. Accordingly I favor a functional abnormality and/or distal colitis over sigmoid volvulus. 4. Atrophic pancreas with small scattered cystic lesions or cystic dilatation of the dorsal pancreatic duct. The largest such lesion is in the pancreatic head and measures 1.2 by 1.2 cm. No progression or change since the prior exam. Given the patient's age, follow up pancreatic protocol CT or MRI is recommended in 2 years time. 5. 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 Left foraminal impingement at L4-5 due to intervertebral and facet spurring. 6. Aortic and coronary atherosclerosis. Aortic Atherosclerosis (ICD10-I70.0). Electronically Signed   By: Gaylyn Rong M.D.   On: 02/26/2022 11:38   CT ABDOMEN PELVIS W CONTRAST  Result Date: 02/26/2022 CLINICAL DATA:  Sepsis. Shortness of breath. Recent antibiotic therapy for pneumonia. Hypoxia. EXAM: CT ANGIOGRAPHY CHEST CT ABDOMEN AND PELVIS WITH CONTRAST TECHNIQUE: Multidetector CT imaging of the chest was performed using the standard protocol during bolus administration of intravenous contrast. Multiplanar CT image reconstructions and MIPs were obtained to evaluate the vascular anatomy. Multidetector CT imaging of the abdomen and pelvis was performed using the standard protocol during bolus administration of intravenous contrast. RADIATION DOSE REDUCTION: This exam was performed according to the departmental dose-optimization program which includes automated exposure control, adjustment of the mA and/or kV according to patient size and/or use of iterative reconstruction technique. CONTRAST:  26mL OMNIPAQUE IOHEXOL 350 MG/ML SOLN COMPARISON:  Chest radiograph 02/26/2022 and CT abdomen 12/14/2020 FINDINGS: Despite efforts by the technologist and patient, motion artifact is present on today's exam and could not be eliminated. This reduces exam sensitivity and specificity. CTA CHEST FINDINGS Cardiovascular: No filling defect is  identified in the pulmonary arterial tree to suggest pulmonary embolus. Reduced sensitivity due to motion artifact. Coronary, aortic arch, and branch vessel atherosclerotic vascular disease. Mild cardiomegaly. Dual lead pacer noted. Mediastinum/Nodes: 1.7 cm hypodense nodule in the right thyroid lobe on image 17 series 2. In the setting of significant comorbidities or limited life expectancy, no follow-up recommended (ref: J Am Coll Radiol. 2015 Feb;12(2): 143-50).No pathologic adenopathy. Lungs/Pleura: Airspace opacities in both lower lobes suspicious for multilobar pneumonia or aspiration pneumonitis. There is some faint airspace opacity peripherally in the right  upper lobe on image 47 series 4. Musculoskeletal: Degenerative sternoclavicular arthropathy with some posterior subluxation of the right clavicle along the sternoclavicular joint as shown on image 37 series 4. Thoracic spondylosis. Review of the MIP images confirms the above findings. CT ABDOMEN and PELVIS FINDINGS Hepatobiliary: Unremarkable Pancreas: Atrophic pancreas with small scattered cystic lesions or cystic dilatation of the dorsal pancreatic duct. The largest such lesion is in the pancreatic head and measures 1.2 by 1.2 cm on image 34 series 4, stable based on my measurements compared to 12/14/2020. No progression or change since the prior exam. Spleen: Unremarkable Adrenals/Urinary Tract: Both adrenal glands appear normal. Bosniak category 1 cyst of the left kidney upper pole is benign and warrants no further workup. Urinary bladder and left distal ureter partially obscured by streak artifact from the left hip implant. Stomach/Bowel: Distended rectum and sigmoid colon, with mild associated wall thickening potentially reflecting distal colitis. The degree of wall thickening is less striking than on the 12/14/2020 exam although the distal colon is substantially more distended with frothy material down on that exam. Along the proximal margin of the  dilated segment the proximal sigmoid colon demonstrates some twisting/swirling as on images 33 through 42 of series 6, along the margin of a diverticulum, but without luminal occlusion/obstruction or other significant indicator of a fulcrum for volvulus. Accordingly I favor a functional abnormality and/or distal colitis over sigmoid volvulus. 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 There is also mild wall calcification in the proximal SMA. No pathologic adenopathy. Reproductive: Prostate gland partially obscured by streak artifact from the patient's left hip implant. Other: No substantial ascites.  Trace perirectal stranding. Musculoskeletal: Left hip hemiarthroplasty. Interdigitating articulation and likely partial fusion at L4-5 and likely partial fusion at L5-S1. Left foraminal impingement at L4-5 due to intervertebral and facet spurring. Probably fused facet joints at L4-5. Mild left foraminal stenosis at L3-4 due to facet arthropathy. Review of the MIP images confirms the above findings. IMPRESSION: 1. No filling defect is identified in the pulmonary arterial tree to suggest pulmonary embolus. Reduced sensitivity due to motion artifact. 2. Airspace opacities in both lower lobes suspicious for multilobar pneumonia or aspiration pneumonitis. There is also some faint airspace opacity peripherally in the right upper lobe. 3. Distended rectum and sigmoid colon, with mild associated wall thickening potentially reflecting distal colitis. The degree of wall thickening is less striking than on the 12/14/2020 exam. There is some twisting/swirling along the proximal margin of the dilated segment of the proximal sigmoid colon, but without luminal occlusion/obstruction or other significant indicator of a fulcrum for volvulus. Accordingly I favor a functional abnormality and/or distal colitis over sigmoid volvulus. 4. Atrophic pancreas with small scattered cystic lesions or  cystic dilatation of the dorsal pancreatic duct. The largest such lesion is in the pancreatic head and measures 1.2 by 1.2 cm. No progression or change since the prior exam. Given the patient's age, follow up pancreatic protocol CT or MRI is recommended in 2 years time. 5. 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 Left foraminal impingement at L4-5 due to intervertebral and facet spurring. 6. Aortic and coronary atherosclerosis. Aortic Atherosclerosis (ICD10-I70.0). Electronically Signed   By: Van Clines M.D.   On: 02/26/2022 11:38   DG Chest Port 1 View  Result Date: 02/26/2022 CLINICAL DATA:  Evaluate sepsis EXAM: PORTABLE CHEST 1 VIEW COMPARISON:  None Available. FINDINGS: LEFT-sided pacer overlies normal cardiac silhouette. No effusion, infiltrate or pneumothorax. No acute osseous abnormality.  IMPRESSION: No evidence of pulmonary infection. Electronically Signed   By: Suzy Bouchard M.D.   On: 02/26/2022 09:36    Cardiac Studies   Echocardiogram 02/26/2022  1. Left ventricular ejection fraction, by estimation, is 30 to 35%. The  left ventricle has moderately decreased function. The left ventricle  demonstrates global hypokinesis. There is severe asymmetric left  ventricular hypertrophy of the basal-septal  segment. Left ventricular diastolic parameters are consistent with Grade I  diastolic dysfunction (impaired relaxation).   2. Right ventricular systolic function is normal. The right ventricular  size is mildly enlarged. There is mildly elevated pulmonary artery  systolic pressure.   3. The mitral valve is grossly normal. No evidence of mitral valve  regurgitation. No evidence of mitral stenosis.   4. The aortic valve was not well visualized. Aortic valve regurgitation  is not visualized.   Comparison(s): Prior images reviewed side by side. Similar LVEF- this  study is more technically difficult that prior.    Patient Profile     88 y.o. male  with a history of pacemaker, decreased EF in 2022 in the setting of flu/pneumonia who is being seen for the evaluation of elevated troponin  Assessment & Plan    Type 2 myocardial infarction secondary to pneumonia  - hsTn elevated to 1387. This is likely due to oxygen supply/demand mismatch in the setting of PNA  - During his last hospitalization, patient was adamant that he did not want any invasive studies. No plans for ischemic eval at this time  - Patient has been on IV heparin since 11/22. Stop today once full 48 hours complete (1630 this afternoon)  - Continue ASA - Resume home carvedilol 3.125 mg BID   Chronic combined systolic and diastolic Heart Failure  - Echo this admission showed EF 30-35% with global hypokiinesis, severe asymmetric LVH, grade I diastolic dysfunction. Note that EF was 35-40% in 03/2021  - BNP was elevated to 1443 yesterday. He was given 2 doses of IV lasix 20 mg. Output 2.4 L urine  - Ok to use lasix PRN  - Resume home carvedilol 3.125 mg BID  - Plan to resume losartan when BP tolerates   Permanent Pacemaker  History of atrial fibrillation  PVCs, NSVT on telemetry  - Patient had PPM implanted in 2002 for sinus node dysfunction. PPM generator was changed out on 04/15/2021  - Followed by Dr. Lovena Le as an outpatient  - Most recent device check from 02/16/2022 showed normal device function.  - While patient has a documented history of atrial fibrillation, there has not been any afib recorded on device recently. No afib on EKG or telemetry. Patient was not on [facility] prior to admission, and I do not think he needs to be on DOAC at discharge given lack of afib on device and advanced age  - Telemetry showed normal sinus rhythm with frequent PVCs, occasional 4 beat runs of NSVT  - Resume carvedilol as above, maintain K>4, mag>2   Pneumonia  - On abx per primary       For questions or updates, please contact [place] Please consult [email or web address] for contact  info under        Signed, Margie Billet, PA-C  02/28/2022, 7:51 AM    Personally seen and examined. Agree with above.  Pleasant doing well.  Breathing much improved. Echo EF 30%, chronic Troponin 1300  Type II myocardial infarction in the setting of pneumonia -IV heparin.  Today.  It has been 48  hours.  Chronic systolic heart failure - Resume carvedilol 3.125 twice a day.  Resume Lasix as needed. -Resume losartan when blood pressure tolerates.  Pneumonia - Per primary team antibiotics  Pacemaker - Doing well.  No recent atrial fibrillation.  No DOAC on discharge.  Candee Furbish, MD

## 2022-03-01 DIAGNOSIS — I480 Paroxysmal atrial fibrillation: Secondary | ICD-10-CM | POA: Diagnosis not present

## 2022-03-01 DIAGNOSIS — I5020 Unspecified systolic (congestive) heart failure: Secondary | ICD-10-CM | POA: Diagnosis not present

## 2022-03-01 DIAGNOSIS — R7989 Other specified abnormal findings of blood chemistry: Secondary | ICD-10-CM | POA: Diagnosis not present

## 2022-03-01 DIAGNOSIS — J189 Pneumonia, unspecified organism: Secondary | ICD-10-CM | POA: Diagnosis not present

## 2022-03-01 DIAGNOSIS — A419 Sepsis, unspecified organism: Secondary | ICD-10-CM | POA: Diagnosis not present

## 2022-03-01 DIAGNOSIS — K529 Noninfective gastroenteritis and colitis, unspecified: Secondary | ICD-10-CM | POA: Diagnosis not present

## 2022-03-01 LAB — GLUCOSE, CAPILLARY
Glucose-Capillary: 160 mg/dL — ABNORMAL HIGH (ref 70–99)
Glucose-Capillary: 136 mg/dL — ABNORMAL HIGH (ref 70–99)
Glucose-Capillary: 186 mg/dL — ABNORMAL HIGH (ref 70–99)
Glucose-Capillary: 188 mg/dL — ABNORMAL HIGH (ref 70–99)

## 2022-03-01 LAB — C DIFFICILE QUICK SCREEN W PCR REFLEX
C Diff antigen: NEGATIVE
C Diff interpretation: NOT DETECTED
C Diff toxin: NEGATIVE

## 2022-03-01 LAB — CBC WITH DIFFERENTIAL/PLATELET
Abs Immature Granulocytes: 0.08 10*3/uL — ABNORMAL HIGH (ref 0.00–0.07)
Basophils Absolute: 0.1 10*3/uL (ref 0.0–0.1)
Basophils Relative: 1 %
Eosinophils Absolute: 0.3 10*3/uL (ref 0.0–0.5)
Eosinophils Relative: 2 %
HCT: 31 % — ABNORMAL LOW (ref 39.0–52.0)
Hemoglobin: 9.9 g/dL — ABNORMAL LOW (ref 13.0–17.0)
Immature Granulocytes: 1 %
Lymphocytes Relative: 13 %
Lymphs Abs: 1.8 10*3/uL (ref 0.7–4.0)
MCH: 29.9 pg (ref 26.0–34.0)
MCHC: 31.9 g/dL (ref 30.0–36.0)
MCV: 93.7 fL (ref 80.0–100.0)
Monocytes Absolute: 1.2 10*3/uL — ABNORMAL HIGH (ref 0.1–1.0)
Monocytes Relative: 8 %
Neutro Abs: 10.5 10*3/uL — ABNORMAL HIGH (ref 1.7–7.7)
Neutrophils Relative %: 75 %
Platelets: 183 10*3/uL (ref 150–400)
RBC: 3.31 MIL/uL — ABNORMAL LOW (ref 4.22–5.81)
RDW: 14.6 % (ref 11.5–15.5)
WBC: 13.9 10*3/uL — ABNORMAL HIGH (ref 4.0–10.5)
nRBC: 0 % (ref 0.0–0.2)

## 2022-03-01 LAB — GASTROINTESTINAL PANEL BY PCR, STOOL (REPLACES STOOL CULTURE)

## 2022-03-01 LAB — BASIC METABOLIC PANEL
Anion gap: 8 (ref 5–15)
BUN: 34 mg/dL — ABNORMAL HIGH (ref 8–23)
CO2: 21 mmol/L — ABNORMAL LOW (ref 22–32)
Calcium: 8.1 mg/dL — ABNORMAL LOW (ref 8.9–10.3)
Chloride: 107 mmol/L (ref 98–111)
Creatinine, Ser: 1.01 mg/dL (ref 0.61–1.24)
GFR, Estimated: >60 mL/min (ref >60)
Glucose, Bld: 154 mg/dL — ABNORMAL HIGH (ref 70–99)
Potassium: 4 mmol/L (ref 3.5–5.1)
Sodium: 136 mmol/L (ref 135–145)

## 2022-03-01 LAB — MAGNESIUM: Magnesium: 1.9 mg/dL (ref 1.7–2.4)

## 2022-03-01 MED ORDER — LOSARTAN POTASSIUM 50 MG PO TABS
25.0000 mg | ORAL_TABLET | Freq: Every day | ORAL | Status: DC
  Administered 2022-03-01 – 2022-03-04 (×4): 25 mg via ORAL
  Filled 2022-03-01 (×4): qty 1

## 2022-03-01 MED ORDER — SPIRONOLACTONE 12.5 MG HALF TABLET
12.5000 mg | ORAL_TABLET | Freq: Every day | ORAL | Status: DC
  Administered 2022-03-01 – 2022-03-05 (×5): 12.5 mg via ORAL
  Filled 2022-03-01 (×5): qty 1

## 2022-03-01 MED ORDER — ENOXAPARIN SODIUM 30 MG/0.3ML IJ SOSY
30.0000 mg | PREFILLED_SYRINGE | INTRAMUSCULAR | Status: DC
  Administered 2022-03-01: 30 mg via SUBCUTANEOUS
  Filled 2022-03-01: qty 0.3

## 2022-03-01 NOTE — Progress Notes (Signed)
PROGRESS NOTE    Nathan Fenlon [name]  GYF:[id number] DOB: [birth year] DOA: 02/26/2022 PCP: Jacelyn Pi, NP    Chief Complaint  Patient presents with   Shortness of Breath    Brief Narrative:  HPI per Dr. York Grice [name] is a 88 y.o. male with medical history significant of DM2, GERD, HTN, HLD, SSS s/p PPM. Presenting with shortness of breath and cough. He reports that he's had shortness of breath for over a week. He was diagnosed with PNA in the outpatient setting and started on an abx. He is unable to tell me which one he had. This morning, he states his coughing got much worse. He didn't have any fever or sick contacts. When his symptoms did not improve, the staff at his ALF called for EMS. He denies any other aggravating or alleviating factors.    Workup done on admission including CT chest abdomen and pelvis concerning for multifocal pneumonia and sigmoid colitis. -Patient placed empirically on IV antibiotics. -Patient also noted on admission to have low CBGs and placed on a D10 drip. -Patient noted to have elevated troponins, cardiology consulted and following.    Assessment & Plan:  Principal Problem:   Sepsis ([place]) Active Problems:   Colitis   Hypertension   Diabetes ([place])   Sinus node dysfunction (HCC)   Multifocal pneumonia   Elevated troponin   GERD (gastroesophageal reflux disease)    Assessment and Plan: * Sepsis ([place]) -Patient admitted and met criteria for sepsis with on admission with fever, tachycardia, leukocytosis with a white count of 21.2, CT chest concerning for multifocal pneumonia and colitis. -Blood cultures ordered and pending. -MRSA PCR negative.  COVID-19 PCR negative.  Influenza A and B PCR negative. -Urine cultures pending. -Blood cultures pending.   -GI pathogen panel pending. -Fever curve trending down. -Leukocytosis trending down. -IV vancomycin has been discontinued. -Continue IV cefepime, IV Flagyl.  -Hopefully could  transition to oral antibiotics if continued improvement in the next 24 hours. -Supportive care.  Colitis -Stool studies for C. difficile pending. -GI pathogen panel pending. -Continue empiric IV cefepime and Flagyl.  GERD (gastroesophageal reflux disease) -PPI.  Elevated troponin -Patient noted to have elevated troponins, seen by cardiology and concern for type II MI secondary to pneumonia. -2D echo done with EF of 30 to 35% and currently off BiPAP. -Patient seen in consultation by cardiology recommending addition of aspirin, carvedilol, losartan once comfortable from her respiratory perspective. -Resumed home regimen Coreg,  -Cozaar resumed per cardiology today in addition to Aldactone.  -Status post 48 hours of IV heparin which has subsequently been discontinued.  -Cardiology following and appreciate input and recommendations.  Multifocal pneumonia -Noted on CT chest. -Patient presented with sepsis and CT chest concerning for multifocal pneumonia. -Patient with some rhonchorous gurgling sounds on examination. -BNP noted to be elevated. -Urine Legionella antigen pending, urine pneumococcus antigen pending.  -Blood cultures pending.  -MRSA PCR negative for/vancomycin discontinued.   -Continue IV cefepime.  IV Flagyl, Mucinex 1200 twice daily, Claritin, Flonase. -Continue PPI. -Status post Lasix 20 mg IV every 12 hours x 2 doses with good urine output and clinical improvement.  Sinus node dysfunction (HCC) -Status post PPM. -BP initially noted to be soft on admission however improving. -Coreg resumed.   -Patient placed back on home regimen Cozaar and Aldactone added per cardiology.  -Cardiology following.  Diabetes ([place]) -Hemoglobin A1c 6.0 (03/17/2021)\ -Repeat hemoglobin A1c at 6.4 (02/27/2022). -Patient noted to have bouts of hypoglycemia and as such  was on a D10 drip. -Patient started on a dysphagia 3 diet, CBGs/hypoglycemia improving and as such D10 changed to D5 normal  saline.  -CBG at 160 this morning.  -Advance diet to a regular diet.  -Discontinue D5 normal saline.  -Change CBGs to every 6 hours.   Hypertension -Blood pressure noted to be soft on admission and in the morning of 02/27/2022, and as such patient ordered a dose of IV albumin. -Blood pressure noted to be elevated in the 170s to 180s on 02/27/2022 mid morning to afternoon.  -Due to concerns for volume overload patient placed on Lasix 20 mg IV every 12 hours x 2 doses with a good urine output.   -BNP noted to be elevated.   -Coreg resumed.   -Patient started back on Cozaar and Aldactone this morning per cardiology.         DVT prophylaxis: Heparin drip>>> Lovenox Code Status: Partial Family Communication: Updated patient.  No family at bedside. Disposition: Transfer to telemetry. TBD/likely back to assisted living facility versus SNF.  Status is: Inpatient Remains inpatient appropriate because: Severity of illness   Consultants:  Cardiology: Dr. Marlou Porch 02/26/2022  Procedures:  CT abdomen and pelvis 02/26/2022 CT angiogram chest 02/26/2022  Antimicrobials:  IV cefepime 02/26/2022>> IV Flagyl 02/26/2022>>>> IV vancomycin 02/26/2022>>> 02/27/2022    Subjective: Patient sitting up in bed.  Overall states he is feeling better.  Feels shortness of breath is improved.  Feels cough is improved.  Denies any chest pain.  No abdominal pain.  Denies any diarrhea.  Noted on telemetry to be in A-fib EKG pending.  Patient noted with soft stool.  Hypoglycemia improved.   Objective: Vitals:   03/01/22 0500 03/01/22 0600 03/01/22 0800 03/01/22 0919  BP: 134/62 (!) 157/69 (!) 171/57   Pulse: 74 83 63   Resp: (!) 32 (!) 34 (!) 25   Temp:    98.4 F (36.9 C)  TempSrc:    Axillary  SpO2: 96% 97% 98%   Weight:      Height:        Intake/Output Summary (Last 24 hours) at 03/01/2022 1045 Last data filed at 03/01/2022 0000 Gross per 24 hour  Intake 636.03 ml  Output 150 ml  Net  486.03 ml   Filed Weights   02/26/22 1355 02/27/22 1112 02/28/22 0500  Weight: 59.2 kg 61.2 kg 60.5 kg    Examination:  General exam: NAD. Respiratory system: Decreased diffuse coarse rhonchorous breath sounds.  No significant wheezing noted.  Fair air movement.  Speaking in full sentences.   Cardiovascular system: RRR no murmurs rubs or gallops.  No JVD.  No lower extremity edema.  Gastrointestinal system: Abdomen is soft, nontender, nondistended, positive bowel sounds.  No rebound.  No guarding.  Central nervous system: Alert and oriented. No focal neurological deficits. Extremities: Symmetric 5 x 5 power. Skin: No rashes, lesions or ulcers Psychiatry: Judgement and insight appear normal. Mood & affect appropriate.     Data Reviewed:   CBC: Recent Labs  Lab 02/26/22 0904 02/27/22 0020 02/27/22 1029 02/28/22 0251 03/01/22 0239  WBC 21.2* 19.6* 21.3* 19.3* 13.9*  NEUTROABS 16.8*  --   --  15.1* 10.5*  HGB 12.7* 10.3* 10.8* 10.7* 9.9*  HCT 39.9 31.7* 33.5* 32.6* 31.0*  MCV 94.5 93.2 93.3 92.1 93.7  PLT 323 187 212 201 527    Basic Metabolic Panel: Recent Labs  Lab 02/26/22 0904 02/27/22 0020 02/28/22 0251 03/01/22 0239  NA 137 137 134* 136  K  4.7 4.0 3.8 4.0  CL 105 110 104 107  CO2 23 22 18* 21*  GLUCOSE 228* 91 189* 154*  BUN 49* 39* 29* 34*  CREATININE 1.04 0.88 0.98 1.01  CALCIUM 9.0 7.9* 7.9* 8.1*  MG 2.1  --  1.8 1.9    GFR: Estimated Creatinine Clearance: 38.3 mL/min (by C-G formula based on SCr of 1.01 mg/dL).  Liver Function Tests: Recent Labs  Lab 02/26/22 0904 02/27/22 0020 02/28/22 0251  AST _0 ALT _1 ALKPHOS 63 43 52  BILITOT 0.4 0.7 0.6  PROT 8.1 5.5* 6.1*  ALBUMIN 4.0 2.8* 2.9*    CBG: Recent Labs  Lab 02/28/22 1540 02/28/22 2017 02/28/22 2355 03/01/22 0346 03/01/22 0811  GLUCAP 192* 190* 165* 160* 136*     Recent Results (from the past 240 hour(s))  Blood Culture (routine x 2)     Status: None  (Preliminary result)   Collection Time: 02/26/22  9:09 AM   Specimen: BLOOD  Result Value Ref Range Status   Specimen Description   Final    BLOOD SITE NOT SPECIFIED Performed at [facility] [address], [place], [place] [address]    Special Requests   Final    BOTTLES DRAWN AEROBIC AND ANAEROBIC Blood Culture adequate volume Performed at [facility] [address], [place], [place] [address]    Culture   Final    NO GROWTH 3 DAYS Performed at [facility], [place] [address] [place], [place] [address]    Report Status PENDING  Incomplete  Blood Culture (routine x 2)     Status: None (Preliminary result)   Collection Time: 02/26/22  9:23 AM   Specimen: BLOOD RIGHT ARM  Result Value Ref Range Status   Specimen Description   Final    BLOOD RIGHT ARM Performed at [facility], [place] [address], [place], [place] [address]    Special Requests   Final    BOTTLES DRAWN AEROBIC AND ANAEROBIC Blood Culture results may not be optimal due to an excessive volume of blood received in culture bottles Performed at [facility] [address] [place], [place] [address]    Culture   Final    NO GROWTH 3 DAYS Performed at [facility], [place] [address] [place],  [address]    Report Status PENDING  Incomplete  Resp Panel by RT-PCR (Flu A&B, Covid) Anterior Nasal Swab     Status: None   Collection Time: 02/26/22  9:52 AM   Specimen: Anterior Nasal Swab  Result Value Ref Range Status   SARS Coronavirus 2 by RT PCR NEGATIVE NEGATIVE Final    Comment: (NOTE) SARS-CoV-2 target nucleic acids are NOT DETECTED.  The SARS-CoV-2 RNA is generally detectable in upper respiratory specimens during the acute phase of infection. The lowest concentration of SARS-CoV-2 viral copies this assay can detect is 138 copies/mL. A negative result does not preclude SARS-Cov-2 infection and should not be used as the sole basis for  treatment or other patient management decisions. A negative result may occur with  improper specimen collection/handling, submission of specimen other than nasopharyngeal swab, presence of viral mutation(s) within the areas targeted by this assay, and inadequate number of viral copies(<138 copies/mL). A negative result must be combined with clinical observations, patient history, and epidemiological information. The expected result is Negative.  Fact Sheet for Patients:  EntrepreneurPulse.com.au  Fact Sheet for Healthcare Providers:  IncredibleEmployment.be  This test is no t yet approved or cleared by the [place] and  has been authorized for detection and/or diagnosis of SARS-CoV-2 by FDA under an Emergency Use Authorization (EUA). This EUA will remain  in effect (meaning this test can be used) for the duration of the COVID-19 declaration under Section 564(b)(1) of the Act, 21 U.S.C.section 360bbb-3(b)(1), unless the authorization is terminated  or revoked sooner.       Influenza A by PCR NEGATIVE NEGATIVE Final   Influenza B by PCR NEGATIVE NEGATIVE Final    Comment: (NOTE) The Xpert Xpress SARS-CoV-2/FLU/RSV plus assay is intended as an aid in the diagnosis of influenza from Nasopharyngeal swab specimens and should not be used as a sole basis for treatment. Nasal washings and aspirates are unacceptable for Xpert Xpress SARS-CoV-2/FLU/RSV testing.  Fact Sheet for Patients: EntrepreneurPulse.com.au  Fact Sheet for Healthcare Providers: IncredibleEmployment.be  This test is not yet approved or cleared by the [place] FDA and has been authorized for detection and/or diagnosis of SARS-CoV-2 by FDA under an Emergency Use Authorization (EUA). This EUA will remain in effect (meaning this test can be used) for the duration of the COVID-19 declaration under Section 564(b)(1) of the Act, 21  U.S.C. section 360bbb-3(b)(1), unless the authorization is terminated or revoked.  Performed at [facility], [place] [address], [place], [place] [address]   MRSA Next Gen by PCR, Nasal     Status: None   Collection Time: 02/26/22  1:32 PM   Specimen: Nasal Mucosa; Nasal Swab  Result Value Ref Range Status   MRSA by PCR Next Gen NOT DETECTED NOT DETECTED Final    Comment: (NOTE) The GeneXpert MRSA Assay (FDA approved for NASAL specimens only), is one component of a comprehensive MRSA colonization surveillance program. It is not intended to diagnose MRSA infection nor to guide or monitor treatment for MRSA infections. Test performance is not FDA approved in patients less than 7 years old. Performed at [facility], [place] [address] [place], [place] [address]   Urine Culture     Status: None   Collection Time: 02/27/22  5:51 PM   Specimen: Urine, Catheterized  Result Value Ref Range Status   Specimen Description   Final    URINE, CATHETERIZED Performed at [facility] [address], [place], [place] [address]    Special Requests   Final    NONE Performed at [facility], [place] [address] [place], [place] [address]    Culture   Final    NO GROWTH Performed at [facility], [place] [address] [place], [place] [address]    Report Status 02/28/2022 FINAL  Final         Radiology Studies: No results found.      Scheduled Meds:  aspirin EC  81 mg Oral Daily   carvedilol  3.125 mg Oral BID WC   Chlorhexidine Gluconate Cloth  6 each Topical Q0600   enoxaparin (LOVENOX) injection  30 mg Subcutaneous Q24H   fluticasone  2 spray Each Nare Daily   guaiFENesin  1,200 mg Oral BID   loratadine  10 mg Oral Daily   losartan  25 mg Oral Daily   mouth rinse  15 mL Mouth Rinse 4 times per day   pantoprazole  40 mg Oral Daily   spironolactone  12.5 mg Oral Daily   Continuous Infusions:  albumin  human     ceFEPime (MAXIPIME) IV  Stopped (03/01/22 0910)   metronidazole Stopped (03/01/22 1115)     LOS: 3 days    Time spent: 40 minutes    Irine Seal, MD [facility]   To contact the attending provider between 7A-7P or the covering provider during after hours 7P-7A, please log into the web site [email or web address] and access using universal [place] password for that web site. If you do not have the password, please call the hospital operator.  03/01/2022, 10:45 AM

## 2022-03-01 NOTE — Evaluation (Signed)
Physical Therapy Evaluation Patient Details Name: Nathan Alvarez. MRN: [id number] DOB: 05-May-1924 Today's Date: 03/01/2022  History of Present Illness  Patient is a 88 year old male who presented with cough and SOB. Patient was noted to have had PNA diagnosis with start of treatment at ALF. When symptoms progressed EMS was called. Patient was found to have sepsis, colitis, elevated troponin, type II MI secondary to pneumonia. PMH: DM II, HTN, SSS s/p PPM, HLD.  Clinical Impression   Patient is a 88 year old male who was admitted for above. Patient was living at ALF at wheelchair level with limited ambulation using walker and caregiver support for ADL tasks. Patient was min A with RW x2 to transfer from edge of bed to recliner in room with line management. Patient quick to fatigue during task.Patient was noted to have decreased functional activity tolerance, decreased standing balance, and decreased safety awareness.  Patient would continue to benefit from skilled PT services at this time while admitted and after d/c to address noted deficits in order and maximize IND and safety.       Recommendations for follow up therapy are one component of a multi-disciplinary discharge planning process, led by the attending physician.  Recommendations may be updated based on patient status, additional functional criteria and insurance authorization.  Follow Up Recommendations Home health PT (at ALF)      Assistance Recommended at Discharge Intermittent Supervision/Assistance  Patient can return home with the following  A little help with walking and/or transfers;A little help with bathing/dressing/bathroom;Assistance with cooking/housework;Assist for transportation;Help with stairs or ramp for entrance    Equipment Recommendations None recommended by PT  Recommendations for Other Services       Functional Status Assessment Patient has had a recent decline in their functional status and demonstrates  the ability to make significant improvements in function in a reasonable and predictable amount of time.     Precautions / Restrictions Precautions Precautions: Fall Restrictions Weight Bearing Restrictions: No      Mobility  Bed Mobility Overal bed mobility: Needs Assistance Bed Mobility: Supine to Sit     Supine to sit: Mod assist, HOB elevated     General bed mobility comments: Assist to bring trunk to upright and to complete rotation to EOB sitting.    Transfers Overall transfer level: Needs assistance Equipment used: Standard walker Transfers: Sit to/from Stand, Bed to chair/wheelchair/BSC Sit to Stand: Min assist, From elevated surface   Step pivot transfers: Min assist       General transfer comment: min assist to bring wt up and fwd and to balance in initial standing; step/pvt bed to recliner    Ambulation/Gait               General Gait Details: step pvt to chair only 2* SOB  Stairs            Wheelchair Mobility    Modified Rankin (Stroke Patients Only)       Balance Overall balance assessment: Needs assistance Sitting-balance support: Bilateral upper extremity supported, Feet unsupported Sitting balance-Leahy Scale: Fair     Standing balance support: Reliant on assistive device for balance, Bilateral upper extremity supported Standing balance-Leahy Scale: Poor                               Pertinent Vitals/Pain Pain Assessment Pain Assessment: No/denies pain    Home Living Family/patient expects to be discharged to:: Assisted  living                 Home Equipment: Conservation officer, nature (2 wheels);Rollator (4 wheels);Shower seat;Grab bars - tub/shower      Prior Function Prior Level of Function : Needs assist             Mobility Comments: uses w/c mostly but RW some in room per patient report. ADLs Comments: Requires assist for ADLs     Hand Dominance   Dominant Hand: Right    Extremity/Trunk  Assessment   Upper Extremity Assessment Upper Extremity Assessment: Overall WFL for tasks assessed    Lower Extremity Assessment Lower Extremity Assessment: Generalized weakness    Cervical / Trunk Assessment Cervical / Trunk Assessment: Kyphotic  Communication   Communication: HOH  Cognition Arousal/Alertness: Awake/alert Behavior During Therapy: WFL for tasks assessed/performed Overall Cognitive Status: Within Functional Limits for tasks assessed                                          General Comments General comments (skin integrity, edema, etc.): audible wheezing throughout session    Exercises     Assessment/Plan    PT Assessment Patient needs continued PT services  PT Problem List Decreased strength;Decreased activity tolerance;Decreased balance;Decreased mobility;Decreased knowledge of use of DME       PT Treatment Interventions DME instruction;Gait training;Functional mobility training;Therapeutic activities;Therapeutic exercise;Patient/family education    PT Goals (Current goals can be found in the Care Plan section)  Acute Rehab PT Goals Patient Stated Goal: Regain IND PT Goal Formulation: With patient Time For Goal Achievement: 03/14/22 Potential to Achieve Goals: Good    Frequency Min 3X/week     Co-evaluation               AM-PAC PT "6 Clicks" Mobility  Outcome Measure Help needed turning from your back to your side while in a flat bed without using bedrails?: A Little Help needed moving from lying on your back to sitting on the side of a flat bed without using bedrails?: A Lot Help needed moving to and from a bed to a chair (including a wheelchair)?: A Little Help needed standing up from a chair using your arms (e.g., wheelchair or bedside chair)?: A Little Help needed to walk in hospital room?: Total Help needed climbing 3-5 steps with a railing? : Total 6 Click Score: 13    End of Session Equipment Utilized During  Treatment: Gait belt Activity Tolerance: Patient tolerated treatment well Patient left: in chair;with call bell/phone within reach;with chair alarm set Nurse Communication: Mobility status PT Visit Diagnosis: Difficulty in walking, not elsewhere classified (R26.2);Muscle weakness (generalized) (M62.81)    Time: [id number] PT Time Calculation (min) (ACUTE ONLY): 27 min   Charges:   PT Evaluation $PT Eval Low Complexity: 1 Low          [place] Pager [phone] Office [phone]   Wash Nienhaus 03/01/2022, 1:47 PM

## 2022-03-01 NOTE — Evaluation (Signed)
Occupational Therapy Evaluation Patient Details Name: Nathan Sciandra [name] MRN: [id number] DOB: [birth year] Today's Date: 03/01/2022   History of Present Illness Patient is a 88 year old male who presented with cough and SOB. Patient was noted to have had PNA diagnosis with start of treatment at ALF. When symptoms progressed EMS was called. Patient was found to have sepsis, colitis, elevated troponin, type II MI secondary to pneumonia. PMH: DM II, HTN, SSS s/p PPM, HLD.   Clinical Impression   Patient is a 88 year old male who was admitted for above. Patient was living at ALF at wheelchair level with caregiver support for ADL tasks. Patient was min A with RW x2 to transfer from edge of bed to recliner in room with line management. Patient quick to fatigue during task.Patient was noted to have decreased functional activity tolerance, decreased standing balance, decreased safety awareness, and decreased knowledge of AD/AE impacting participation in ADLs.  Patient would continue to benefit from skilled OT services at this time while admitted and after d/c to address noted deficits in order to improve overall safety and independence in ADLs.        Recommendations for follow up therapy are one component of a multi-disciplinary discharge planning process, led by the attending physician.  Recommendations may be updated based on patient status, additional functional criteria and insurance authorization.   Follow Up Recommendations  Home health OT     Assistance Recommended at Discharge Frequent or constant Supervision/Assistance  Patient can return home with the following A little help with walking and/or transfers;A little help with bathing/dressing/bathroom;Direct supervision/assist for financial management;Help with stairs or ramp for entrance;Direct supervision/assist for medications management;Assist for transportation;Assistance with cooking/housework    Functional Status Assessment  Patient  has had a recent decline in their functional status and demonstrates the ability to make significant improvements in function in a reasonable and predictable amount of time.  Equipment Recommendations  None recommended by OT    Recommendations for Other Services       Precautions / Restrictions Restrictions Weight Bearing Restrictions: No      Mobility Bed Mobility Overal bed mobility: Needs Assistance Bed Mobility: Supine to Sit     Supine to sit: Mod assist, HOB elevated          Transfers                          Balance Overall balance assessment: Needs assistance Sitting-balance support: Bilateral upper extremity supported, Feet unsupported Sitting balance-Leahy Scale: Fair     Standing balance support: Reliant on assistive device for balance, Bilateral upper extremity supported Standing balance-Leahy Scale: Poor                             ADL either performed or assessed with clinical judgement   ADL Overall ADL's : Needs assistance/impaired Eating/Feeding: Set up;Sitting   Grooming: Set up;Sitting   Upper Body Bathing: Minimal assistance;Sitting   Lower Body Bathing: Maximal assistance;Sitting/lateral leans   Upper Body Dressing : Minimal assistance;Sitting   Lower Body Dressing: Maximal assistance;Sitting/lateral leans   Toilet Transfer: Minimal assistance;+2 for safety/equipment;Rolling walker (2 wheels) Toilet Transfer Details (indicate cue type and reason): to transfer from edge of bed to recliner in room with RW. Toileting- Clothing Manipulation and Hygiene: Maximal assistance;Sit to/from stand         General ADL Comments: patient reported using w/c mostly at home for  mobility. patient reported staff helped him with ADLs.     Vision Patient Visual Report: No change from baseline       Perception     Praxis      Pertinent Vitals/Pain Pain Assessment Pain Assessment: No/denies pain     Hand Dominance Right    Extremity/Trunk Assessment Upper Extremity Assessment Upper Extremity Assessment: Overall WFL for tasks assessed   Lower Extremity Assessment Lower Extremity Assessment: Defer to PT evaluation   Cervical / Trunk Assessment Cervical / Trunk Assessment: Kyphotic   Communication Communication Communication: HOH   Cognition Arousal/Alertness: Awake/alert Behavior During Therapy: WFL for tasks assessed/performed Overall Cognitive Status: Within Functional Limits for tasks assessed                                       General Comments  noted to have breathing sounds that were audible in room during whole session.    Exercises     Shoulder Instructions      Home Living Family/patient expects to be discharged to:: Assisted living ([facility])                                        Prior Functioning/Environment Prior Level of Function : Needs assist             Mobility Comments: uses w/c mostly but RW some in room per patient report. ADLs Comments: Requires assist for ADLs        OT Problem List: Impaired balance (sitting and/or standing);Decreased coordination;Decreased activity tolerance;Decreased knowledge of precautions;Decreased knowledge of use of DME or AE      OT Treatment/Interventions: Self-care/ADL training;Patient/family education;Therapeutic activities;Balance training    OT Goals(Current goals can be found in the care plan section) Acute Rehab OT Goals Patient Stated Goal: to get back to ALF OT Goal Formulation: With patient Time For Goal Achievement: 03/15/22 Potential to Achieve Goals: Fair  OT Frequency: Min 2X/week    Co-evaluation              AM-PAC OT "6 Clicks" Daily Activity     Outcome Measure Help from another person eating meals?: None Help from another person taking care of personal grooming?: A Little Help from another person toileting, which includes using toliet, bedpan, or urinal?: A  Lot Help from another person bathing (including washing, rinsing, drying)?: A Lot Help from another person to put on and taking off regular upper body clothing?: A Little Help from another person to put on and taking off regular lower body clothing?: A Lot 6 Click Score: 16   End of Session Equipment Utilized During Treatment: Gait belt;Rolling walker (2 wheels) Nurse Communication: Mobility status  Activity Tolerance: Patient tolerated treatment well Patient left: in chair;with call bell/phone within reach;with chair alarm set  OT Visit Diagnosis: Unsteadiness on feet (R26.81);Muscle weakness (generalized) (M62.81);Other abnormalities of gait and mobility (R26.89)                Time: 5427-0623 OT Time Calculation (min): 28 min Charges:  OT General Charges $OT Visit: 1 Visit OT Evaluation $OT Eval Moderate Complexity: 1 Mod  Berdene Askari OTR/L, MS Acute Rehabilitation Department Office# [phone]   Selinda Flavin 03/01/2022, 1:02 PM

## 2022-03-01 NOTE — Progress Notes (Addendum)
Progress Note  Patient Name: Nathan Guilmette [name] Date of Encounter: 03/01/2022  Primary Cardiologist: Lewayne Bunting, MD  Subjective   States breathing less labored, no active cough.  No chest tightness or palpitations.  Inpatient Medications    Scheduled Meds:  aspirin EC  81 mg Oral Daily   carvedilol  3.125 mg Oral BID WC   Chlorhexidine Gluconate Cloth  6 each Topical Q0600   fluticasone  2 spray Each Nare Daily   guaiFENesin  1,200 mg Oral BID   loratadine  10 mg Oral Daily   mouth rinse  15 mL Mouth Rinse 4 times per day   pantoprazole  40 mg Oral Daily   Continuous Infusions:  albumin human     ceFEPime (MAXIPIME) IV Stopped (02/28/22 2151)   metronidazole Stopped (02/28/22 2222)   PRN Meds: acetaminophen **OR** acetaminophen, albuterol, mouth rinse   Vital Signs    Vitals:   03/01/22 0350 03/01/22 0400 03/01/22 0500 03/01/22 0600  BP:  (!) 128/51 134/62 (!) 157/69  Pulse:  79 74 83  Resp:  (!) 39 (!) 32 (!) 34  Temp: 99 F (37.2 C)     TempSrc: Axillary     SpO2:  94% 96% 97%  Weight:      Height:        Intake/Output Summary (Last 24 hours) at 03/01/2022 0817 Last data filed at 03/01/2022 0000 Gross per 24 hour  Intake 1164.26 ml  Output 400 ml  Net 764.26 ml   Filed Weights   02/26/22 1355 02/27/22 1112 02/28/22 0500  Weight: 59.2 kg 61.2 kg 60.5 kg    Telemetry    Probable atrial fibrillation, intermittent ventricular pacing and PVCs.  Personally reviewed.  ECG    An ECG dated 02/26/2022 was personally reviewed today and demonstrated:  Sinus rhythm.  Physical Exam   GEN: No acute distress.   Neck: No JVD. Cardiac: RRR, 1/6 systolic murmur, no gallop.  Respiratory: No wheezing. GI: Soft, nontender, bowel sounds present. MS: No edema; No deformity. Neuro:  Nonfocal. Psych: Alert and oriented x 3. Normal affect.  Labs    Chemistry Recent Labs  Lab 02/26/22 0904 02/27/22 0020 02/28/22 0251 03/01/22 0239  NA 137 137 134*  136  K 4.7 4.0 3.8 4.0  CL 105 110 104 107  CO2 23 22 18* 21*  GLUCOSE 228* 91 189* 154*  BUN 49* 39* 29* 34*  CREATININE 1.04 0.88 0.98 1.01  CALCIUM 9.0 7.9* 7.9* 8.1*  PROT 8.1 5.5* 6.1*  --   ALBUMIN 4.0 2.8* 2.9*  --   AST 19 22 31   --   ALT 12 9 11   --   ALKPHOS 63 43 52  --   BILITOT 0.4 0.7 0.6  --   GFRNONAA >60 >60 >60 >60  ANIONGAP 9 5 12 8      Hematology Recent Labs  Lab 02/27/22 1029 02/28/22 0251 03/01/22 0239  WBC 21.3* 19.3* 13.9*  RBC 3.59* 3.54* 3.31*  HGB 10.8* 10.7* 9.9*  HCT 33.5* 32.6* 31.0*  MCV 93.3 92.1 93.7  MCH 30.1 30.2 29.9  MCHC 32.2 32.8 31.9  RDW 14.6 14.6 14.6  PLT 212 201 183    Cardiac Enzymes Recent Labs  Lab 02/26/22 0904 02/26/22 1350  TROPONINIHS 57* 1,387*    BNP Recent Labs  Lab 02/26/22 0904 02/27/22 1025 02/28/22 0251  BNP 428.6* 1,443.2* 783.1*     Radiology    02/28/22 EKG SITE RITE  Result Date: 02/27/2022 If  Site Rite image not attached, placement could not be confirmed due to current cardiac rhythm.   Cardiac Studies   Echocardiogram 02/26/2022:  1. Left ventricular ejection fraction, by estimation, is 30 to 35%. The  left ventricle has moderately decreased function. The left ventricle  demonstrates global hypokinesis. There is severe asymmetric left  ventricular hypertrophy of the basal-septal  segment. Left ventricular diastolic parameters are consistent with Grade I  diastolic dysfunction (impaired relaxation).   2. Right ventricular systolic function is normal. The right ventricular  size is mildly enlarged. There is mildly elevated pulmonary artery  systolic pressure.   3. The mitral valve is grossly normal. No evidence of mitral valve  regurgitation. No evidence of mitral stenosis.   4. The aortic valve was not well visualized. Aortic valve regurgitation  is not visualized.   Assessment & Plan    1.  Type II NSTEMI in the setting of pneumonia, peak high-sensitivity troponin I 1443.  Completed  48-hour course of IV heparin.  No active chest pain.  Currently on aspirin.  2.  HFrEF, chronic with LVEF 30 to 35% (35 to 40% December 2022).  He has diuresed with as needed IV Lasix.  Low-dose Coreg resumed yesterday.  Was also on Cozaar as an outpatient.  Blood pressure trending up and renal function stable.  3.  Sinus node dysfunction with [employer] pacemaker in place followed by Dr. Ladona Ridgel.  4.  Paroxysmal atrial fibrillation by history.  None noted on last device interrogation, but appears to be in atrial fibrillation by telemetry today.  Check ECG.  He has not been anticoagulated over time and this was not planned per recent review of Dr. Anne Fu note.  CHA2DS2-VASc score is 6.  For now would continue aspirin and current dose of Coreg.  Adding back Cozaar 25 mg daily and also start Aldactone 12.5 mg daily.  Check ECG to confirm rhythm.  He has not been anticoagulated over time with history of PAF.  Signed, Nona Dell, MD  03/01/2022, 8:17 AM

## 2022-03-02 DIAGNOSIS — R7989 Other specified abnormal findings of blood chemistry: Secondary | ICD-10-CM | POA: Diagnosis not present

## 2022-03-02 DIAGNOSIS — A419 Sepsis, unspecified organism: Secondary | ICD-10-CM | POA: Diagnosis not present

## 2022-03-02 DIAGNOSIS — K529 Noninfective gastroenteritis and colitis, unspecified: Secondary | ICD-10-CM | POA: Diagnosis not present

## 2022-03-02 DIAGNOSIS — I5020 Unspecified systolic (congestive) heart failure: Secondary | ICD-10-CM | POA: Diagnosis not present

## 2022-03-02 DIAGNOSIS — J189 Pneumonia, unspecified organism: Secondary | ICD-10-CM | POA: Diagnosis not present

## 2022-03-02 LAB — CBC WITH DIFFERENTIAL/PLATELET
Abs Immature Granulocytes: 0.06 10*3/uL (ref 0.00–0.07)
Basophils Absolute: 0.1 10*3/uL (ref 0.0–0.1)
Basophils Relative: 1 %
Eosinophils Absolute: 0.3 10*3/uL (ref 0.0–0.5)
Eosinophils Relative: 3 %
HCT: 30.4 % — ABNORMAL LOW (ref 39.0–52.0)
Hemoglobin: 9.9 g/dL — ABNORMAL LOW (ref 13.0–17.0)
Immature Granulocytes: 1 %
Lymphocytes Relative: 15 %
Lymphs Abs: 1.6 10*3/uL (ref 0.7–4.0)
MCH: 30 pg (ref 26.0–34.0)
MCHC: 32.6 g/dL (ref 30.0–36.0)
MCV: 92.1 fL (ref 80.0–100.0)
Monocytes Absolute: 0.8 10*3/uL (ref 0.1–1.0)
Monocytes Relative: 8 %
Neutro Abs: 7.9 10*3/uL — ABNORMAL HIGH (ref 1.7–7.7)
Neutrophils Relative %: 72 %
Platelets: 186 10*3/uL (ref 150–400)
RBC: 3.3 MIL/uL — ABNORMAL LOW (ref 4.22–5.81)
RDW: 14.7 % (ref 11.5–15.5)
WBC: 10.7 10*3/uL — ABNORMAL HIGH (ref 4.0–10.5)
nRBC: 0 % (ref 0.0–0.2)

## 2022-03-02 LAB — GLUCOSE, CAPILLARY
Glucose-Capillary: 129 mg/dL — ABNORMAL HIGH (ref 70–99)
Glucose-Capillary: 129 mg/dL — ABNORMAL HIGH (ref 70–99)
Glucose-Capillary: 212 mg/dL — ABNORMAL HIGH (ref 70–99)
Glucose-Capillary: 395 mg/dL — ABNORMAL HIGH (ref 70–99)
Glucose-Capillary: 402 mg/dL — ABNORMAL HIGH (ref 70–99)

## 2022-03-02 LAB — BASIC METABOLIC PANEL
Anion gap: 10 (ref 5–15)
BUN: 32 mg/dL — ABNORMAL HIGH (ref 8–23)
CO2: 20 mmol/L — ABNORMAL LOW (ref 22–32)
Calcium: 8.4 mg/dL — ABNORMAL LOW (ref 8.9–10.3)
Chloride: 110 mmol/L (ref 98–111)
Creatinine, Ser: 0.92 mg/dL (ref 0.61–1.24)
GFR, Estimated: >60 mL/min (ref >60)
Glucose, Bld: 127 mg/dL — ABNORMAL HIGH (ref 70–99)
Potassium: 3.7 mmol/L (ref 3.5–5.1)
Sodium: 140 mmol/L (ref 135–145)

## 2022-03-02 MED ORDER — METRONIDAZOLE 500 MG PO TABS
500.0000 mg | ORAL_TABLET | Freq: Two times a day (BID) | ORAL | Status: DC
  Administered 2022-03-02 – 2022-03-05 (×7): 500 mg via ORAL
  Filled 2022-03-02 (×7): qty 1

## 2022-03-02 MED ORDER — LEVALBUTEROL HCL 0.63 MG/3ML IN NEBU
0.6300 mg | INHALATION_SOLUTION | Freq: Three times a day (TID) | RESPIRATORY_TRACT | Status: DC
  Administered 2022-03-02 – 2022-03-05 (×9): 0.63 mg via RESPIRATORY_TRACT
  Filled 2022-03-02 (×9): qty 3

## 2022-03-02 MED ORDER — ENOXAPARIN SODIUM 40 MG/0.4ML IJ SOSY
40.0000 mg | PREFILLED_SYRINGE | INTRAMUSCULAR | Status: DC
  Administered 2022-03-02 – 2022-03-05 (×4): 40 mg via SUBCUTANEOUS
  Filled 2022-03-02 (×4): qty 0.4

## 2022-03-02 MED ORDER — HYDROCODONE BIT-HOMATROP MBR 5-1.5 MG/5ML PO SOLN
5.0000 mL | Freq: Four times a day (QID) | ORAL | Status: DC | PRN
  Administered 2022-03-02 – 2022-03-03 (×2): 5 mL via ORAL
  Filled 2022-03-02 (×2): qty 5

## 2022-03-02 NOTE — Progress Notes (Signed)
PROGRESS NOTE    Nathan Oblinger [name]  VOH:[id number] DOB: [birth year] DOA: 02/26/2022 PCP: Nathan Pi, NP    Chief Complaint  Patient presents with   Shortness of Breath    Brief Narrative:  HPI per Dr. York Grice [name] is a 88 y.o. male with medical history significant of DM2, GERD, HTN, HLD, SSS s/p PPM. Presenting with shortness of breath and cough. He reports that he's had shortness of breath for over a week. He was diagnosed with PNA in the outpatient setting and started on an abx. He is unable to tell me which one he had. This morning, he states his coughing got much worse. He didn't have any fever or sick contacts. When his symptoms did not improve, the staff at his ALF called for EMS. He denies any other aggravating or alleviating factors.    Workup done on admission including CT chest abdomen and pelvis concerning for multifocal pneumonia and sigmoid colitis. -Patient placed empirically on IV antibiotics. -Patient also noted on admission to have low CBGs and placed on a D10 drip. -Patient noted to have elevated troponins, cardiology consulted and following.    Assessment & Plan:  Principal Problem:   Sepsis ([place]) Active Problems:   Colitis   Hypertension   Diabetes ([place])   Sinus node dysfunction (HCC)   Multifocal pneumonia   Elevated troponin   GERD (gastroesophageal reflux disease)    Assessment and Plan: * Sepsis ([place]) -Patient admitted and met criteria for sepsis with on admission with fever, tachycardia, leukocytosis with a white count of 21.2, CT chest concerning for multifocal pneumonia and colitis. -Blood cultures ordered and pending. -MRSA PCR negative.  COVID-19 PCR negative.  Influenza A and B PCR negative. -Urine cultures negative. -Blood cultures pending.   -GI pathogen panel negative. -Fever curve trending down. -Leukocytosis trending down. -IV vancomycin has been discontinued. -Continue IV cefepime. -Change IV Flagyl to oral  Flagyl. -If continued improvement will transition from IV cefepime to oral antibiotics tomorrow. -Supportive care.  Colitis -Stool studies for C. difficile negative -GI pathogen panel negative. -Continue empiric IV cefepime and Flagyl. -Discontinue contact precautions.  GERD (gastroesophageal reflux disease) -PPI.  Elevated troponin -Patient noted to have elevated troponins, seen by cardiology and concern for type II MI secondary to pneumonia. -2D echo done with EF of 30 to 35% and currently off BiPAP. -Patient seen in consultation by cardiology recommending addition of aspirin, carvedilol, losartan once comfortable from her respiratory perspective. -Resumed home regimen Coreg,  -Cozaar resumed per cardiology today in addition to Aldactone.  -Status post 48 hours of IV heparin which has subsequently been discontinued.  -Cardiology following and appreciate input and recommendations.  Multifocal pneumonia -Noted on CT chest. -Patient presented with sepsis and CT chest concerning for multifocal pneumonia. -Patient with improving rhonchorous gurgling sounds on examination. -BNP noted to be elevated. -Urine Legionella antigen pending, urine pneumococcus antigen pending.  -Blood cultures pending.  -MRSA PCR negative for/vancomycin discontinued.   -Continue IV cefepime.  Mucinex 1200 twice daily, Claritin, Flonase. -Change IV Flagyl to oral Flagyl. -Continue PPI. -Status post Lasix 20 mg IV every 12 hours x 2 doses with good urine output and clinical improvement.  Sinus node dysfunction (HCC) -Status post PPM. -BP initially noted to be soft on admission however improving. -Coreg resumed.   -Patient placed back on home regimen Cozaar and Aldactone added per cardiology.  -Cardiology following.  Diabetes ([place]) -Hemoglobin A1c 6.0 (03/17/2021)\ -Repeat hemoglobin A1c at 6.4 (02/27/2022). -Patient noted  to have bouts of hypoglycemia and as such was on a D10 drip. -Patient started on  a dysphagia 3 diet, CBGs/hypoglycemia improving and as such D10 changed to D5 normal saline.  -D5 normal saline discontinued, CBG at 129 this morning. -Continue CBG every 6 hours.  Hypertension -Blood pressure noted to be soft on admission and in the morning of 02/27/2022, and as such patient ordered a dose of IV albumin. -Blood pressure noted to be elevated in the 170s to 180s on 02/27/2022 mid morning to afternoon.  -Due to concerns for volume overload patient placed on Lasix 20 mg IV every 12 hours x 2 doses with a good urine output.   -BNP noted to be elevated.   -Coreg resumed. -Patient started back on Cozaar and Aldactone per cardiology.         DVT prophylaxis: Heparin drip>>> Lovenox Code Status: Partial Family Communication: Updated patient.  No family at bedside. Disposition: Likely back to assisted living facility versus SNF.  Status is: Inpatient Remains inpatient appropriate because: Severity of illness   Consultants:  Cardiology: Dr. Marlou Porch 02/26/2022  Procedures:  CT abdomen and pelvis 02/26/2022 CT angiogram chest 02/26/2022  Antimicrobials:  IV cefepime 02/26/2022>> IV Flagyl 02/26/2022>>>> IV vancomycin 02/26/2022>>> 02/27/2022    Subjective: Sitting up in bed drinking Ensure.  States just ate breakfast and now his lunch is here.  Feels shortness of breath is improving.  Still with cough that is slowly improving.  No chest pain.  No abdominal pain.  Overall feeling better than he did on admission.    Objective: Vitals:   03/02/22 0500 03/02/22 0524 03/02/22 1410 03/02/22 1435  BP:  (!) 155/75 (!) 140/73   Pulse:  80 78   Resp:  20 18   Temp:  98.9 F (37.2 C) 97.6 F (36.4 C)   TempSrc:  Oral Oral   SpO2:  96% 95% 95%  Weight: 62.2 kg     Height:        Intake/Output Summary (Last 24 hours) at 03/02/2022 1717 Last data filed at 03/02/2022 0500 Gross per 24 hour  Intake --  Output 400 ml  Net -400 ml   Filed Weights   02/27/22 1112  02/28/22 0500 03/02/22 0500  Weight: 61.2 kg 60.5 kg 62.2 kg    Examination:  General exam: NAD. Respiratory system: Decreased diffuse coarse rhonchorous breath sounds.  Minimal wheezing.  Fair air movement.  Speaking in full sentences.  Cardiovascular system: Regular rate rhythm no murmurs rubs or gallops.  No JVD.  No lower extremity edema.  Gastrointestinal system: Abdomen is soft, nontender, nondistended, positive bowel sounds.  No rebound.  No guarding. Central nervous system: Alert and oriented. No focal neurological deficits. Extremities: Symmetric 5 x 5 power. Skin: No rashes, lesions or ulcers Psychiatry: Judgement and insight appear normal. Mood & affect appropriate.     Data Reviewed:   CBC: Recent Labs  Lab 02/26/22 0904 02/27/22 0020 02/27/22 1029 02/28/22 0251 03/01/22 0239 03/02/22 0535  WBC 21.2* 19.6* 21.3* 19.3* 13.9* 10.7*  NEUTROABS 16.8*  --   --  15.1* 10.5* 7.9*  HGB 12.7* 10.3* 10.8* 10.7* 9.9* 9.9*  HCT 39.9 31.7* 33.5* 32.6* 31.0* 30.4*  MCV 94.5 93.2 93.3 92.1 93.7 92.1  PLT 323 187 212 201 183 722    Basic Metabolic Panel: Recent Labs  Lab 02/26/22 0904 02/27/22 0020 02/28/22 0251 03/01/22 0239 03/02/22 0535  NA 137 137 134* 136 140  K 4.7 4.0 3.8 4.0 3.7  CL  105 110 104 107 110  CO2 23 22 18* 21* 20*  GLUCOSE 228* 91 189* 154* 127*  BUN 49* 39* 29* 34* 32*  CREATININE 1.04 0.88 0.98 1.01 0.92  CALCIUM 9.0 7.9* 7.9* 8.1* 8.4*  MG 2.1  --  1.8 1.9  --     GFR: Estimated Creatinine Clearance: 43.2 mL/min (by C-G formula based on SCr of 0.92 mg/dL).  Liver Function Tests: Recent Labs  Lab 02/26/22 0904 02/27/22 0020 02/28/22 0251  AST _0 ALT _1 ALKPHOS 63 43 52  BILITOT 0.4 0.7 0.6  PROT 8.1 5.5* 6.1*  ALBUMIN 4.0 2.8* 2.9*    CBG: Recent Labs  Lab 03/01/22 1145 03/01/22 1747 03/02/22 0006 03/02/22 0616 03/02/22 1156  GLUCAP 186* 188* 129* 129* 212*     Recent Results (from the past 240 hour(s))   Blood Culture (routine x 2)     Status: None (Preliminary result)   Collection Time: 02/26/22  9:09 AM   Specimen: BLOOD  Result Value Ref Range Status   Specimen Description   Final    BLOOD SITE NOT SPECIFIED Performed at [facility] [address], [place], [place] [address]    Special Requests   Final    BOTTLES DRAWN AEROBIC AND ANAEROBIC Blood Culture adequate volume Performed at [facility] [address], [place], [place] [address]    Culture   Final    NO GROWTH 4 DAYS Performed at [facility], [place] [address], [place], [place] [address]    Report Status PENDING  Incomplete  Blood Culture (routine x 2)     Status: None (Preliminary result)   Collection Time: 02/26/22  9:23 AM   Specimen: BLOOD RIGHT ARM  Result Value Ref Range Status   Specimen Description   Final    BLOOD RIGHT ARM Performed at [facility], [place] [address], [place], [place] [address]    Special Requests   Final    BOTTLES DRAWN AEROBIC AND ANAEROBIC Blood Culture results may not be optimal due to an excessive volume of blood received in culture bottles Performed at [facility] [address] [place], [place] [address]    Culture   Final    NO GROWTH 4 DAYS Performed at [facility], [place] [address] [place], [place] [address]    Report Status PENDING  Incomplete  Resp Panel by RT-PCR (Flu A&B, Covid) Anterior Nasal Swab     Status: None   Collection Time: 02/26/22  9:52 AM   Specimen: Anterior Nasal Swab  Result Value Ref Range Status   SARS Coronavirus 2 by RT PCR NEGATIVE NEGATIVE Final    Comment: (NOTE) SARS-CoV-2 target nucleic acids are NOT DETECTED.  The SARS-CoV-2 RNA is generally detectable in upper respiratory specimens during the acute phase of infection. The lowest concentration of SARS-CoV-2 viral copies this assay can detect is 138 copies/mL. A negative result does not preclude SARS-Cov-2 infection  and should not be used as the sole basis for treatment or other patient management decisions. A negative result may occur with  improper specimen collection/handling, submission of specimen other than nasopharyngeal swab, presence of viral mutation(s) within the areas targeted by this assay, and inadequate number of viral copies(<138 copies/mL). A negative result must be combined with clinical observations, patient history, and epidemiological information. The expected result is Negative.  Fact Sheet for Patients:  EntrepreneurPulse.com.au  Fact Sheet for  Healthcare Providers:  IncredibleEmployment.be  This test is no t yet approved or cleared by the [place] and  has been authorized for detection and/or diagnosis of SARS-CoV-2 by FDA under an Emergency Use Authorization (EUA). This EUA will remain  in effect (meaning this test can be used) for the duration of the COVID-19 declaration under Section 564(b)(1) of the Act, 21 U.S.C.section 360bbb-3(b)(1), unless the authorization is terminated  or revoked sooner.       Influenza A by PCR NEGATIVE NEGATIVE Final   Influenza B by PCR NEGATIVE NEGATIVE Final    Comment: (NOTE) The Xpert Xpress SARS-CoV-2/FLU/RSV plus assay is intended as an aid in the diagnosis of influenza from Nasopharyngeal swab specimens and should not be used as a sole basis for treatment. Nasal washings and aspirates are unacceptable for Xpert Xpress SARS-CoV-2/FLU/RSV testing.  Fact Sheet for Patients: EntrepreneurPulse.com.au  Fact Sheet for Healthcare Providers: IncredibleEmployment.be  This test is not yet approved or cleared by the [place] FDA and has been authorized for detection and/or diagnosis of SARS-CoV-2 by FDA under an Emergency Use Authorization (EUA). This EUA will remain in effect (meaning this test can be used) for the duration of the COVID-19 declaration  under Section 564(b)(1) of the Act, 21 U.S.C. section 360bbb-3(b)(1), unless the authorization is terminated or revoked.  Performed at [facility], [place] [address] [place], [place] [address]   C Difficile Quick Screen w PCR reflex     Status: None   Collection Time: 02/26/22 10:32 AM   Specimen: STOOL  Result Value Ref Range Status   C Diff antigen NEGATIVE NEGATIVE Final   C Diff toxin NEGATIVE NEGATIVE Final   C Diff interpretation No C. difficile detected.  Final    Comment: Performed at [facility], [place] [address], [place], [place] [address]  MRSA Next Gen by PCR, Nasal     Status: None   Collection Time: 02/26/22  1:32 PM   Specimen: Nasal Mucosa; Nasal Swab  Result Value Ref Range Status   MRSA by PCR Next Gen NOT DETECTED NOT DETECTED Final    Comment: (NOTE) The GeneXpert MRSA Assay (FDA approved for NASAL specimens only), is one component of a comprehensive MRSA colonization surveillance program. It is not intended to diagnose MRSA infection nor to guide or monitor treatment for MRSA infections. Test performance is not FDA approved in patients less than 57 years old. Performed at [facility], [place] [address] [place], [place] [address]   Gastrointestinal Panel by PCR , Stool     Status: None   Collection Time: 02/27/22 10:31 AM   Specimen: Stool  Result Value Ref Range Status   Campylobacter species NOT DETECTED NOT DETECTED Final   Plesimonas shigelloides NOT DETECTED NOT DETECTED Final   Salmonella species NOT DETECTED NOT DETECTED Final   Yersinia enterocolitica NOT DETECTED NOT DETECTED Final   Vibrio species NOT DETECTED NOT DETECTED Final   Vibrio cholerae NOT DETECTED NOT DETECTED Final   Enteroaggregative E coli (EAEC) NOT DETECTED NOT DETECTED Final   Enteropathogenic E coli (EPEC) NOT DETECTED NOT DETECTED Final   Enterotoxigenic E coli (ETEC) NOT DETECTED NOT DETECTED Final   Shiga like toxin  producing E coli (STEC) NOT DETECTED NOT DETECTED Final   Shigella/Enteroinvasive E coli (EIEC) NOT DETECTED NOT DETECTED Final   Cryptosporidium NOT DETECTED NOT DETECTED Final   Cyclospora cayetanensis NOT DETECTED NOT DETECTED Final   Entamoeba histolytica NOT DETECTED NOT DETECTED  Final   Giardia lamblia NOT DETECTED NOT DETECTED Final   Adenovirus F40/41 NOT DETECTED NOT DETECTED Final   Astrovirus NOT DETECTED NOT DETECTED Final   Norovirus GI/GII NOT DETECTED NOT DETECTED Final   Rotavirus A NOT DETECTED NOT DETECTED Final   Sapovirus (I, II, IV, and V) NOT DETECTED NOT DETECTED Final    Comment: Performed at [facility], [address] [place], [place] [address]  Urine Culture     Status: None   Collection Time: 02/27/22  5:51 PM   Specimen: Urine, Catheterized  Result Value Ref Range Status   Specimen Description   Final    URINE, CATHETERIZED Performed at [facility], [place] [address], [place], [place] [address]    Special Requests   Final    NONE Performed at [facility], [place] [address] [place], [place] [address]    Culture   Final    NO GROWTH Performed at [facility], [place] [address] [place], [place] [address]    Report Status 02/28/2022 FINAL  Final         Radiology Studies: No results found.      Scheduled Meds:  aspirin EC  81 mg Oral Daily   carvedilol  3.125 mg Oral BID WC   Chlorhexidine Gluconate Cloth  6 each Topical Q0600   enoxaparin (LOVENOX) injection  40 mg Subcutaneous Q24H   fluticasone  2 spray Each Nare Daily   guaiFENesin  1,200 mg Oral BID   levalbuterol  0.63 mg Nebulization TID   loratadine  10 mg Oral Daily   losartan  25 mg Oral Daily   metroNIDAZOLE  500 mg Oral Q12H   mouth rinse  15 mL Mouth Rinse 4 times per day   pantoprazole  40 mg Oral Daily   spironolactone  12.5 mg Oral Daily   Continuous Infusions:  albumin human     ceFEPime (MAXIPIME) IV 2 g (03/02/22  0930)     LOS: 4 days    Time spent: 40 minutes    Irine Seal, MD [facility]   To contact the attending provider between 7A-7P or the covering provider during after hours 7P-7A, please log into the web site [email or web address] and access using universal [place] password for that web site. If you do not have the password, please call the hospital operator.  03/02/2022, 5:17 PM

## 2022-03-02 NOTE — Progress Notes (Signed)
Pharmacy Antibiotic Note  Nathan Alegria [name] is a 88 y.o. male admitted on 02/26/2022 with pneumonia.  Pharmacy has been consulted for Vanco, Cefepime dosing.  Active Problem(s): from a facility with c/o SOB, hx pneumonia just finished antibiotics. 82% sats on RA  ID: HCAP  (outpatient abx unknown) + colitis - Afebrile, improving WBC  11/22: Cefepime>> 11/22: Vanco>> 11/22 11/22 Flagyl >>   11/22: Flu/Covid neg 11/22 BCx: ngtd 11/23 UCx: ngf 11/23 GI panel: negative  Plan: Continue cefepime 2g IV q12h Flagyl per Md   Height: 5\' 7"  (170.2 cm) Weight: 62.2 kg (137 lb 3.2 oz) IBW/kg (Calculated) : 66.1  Temp (24hrs), Avg:98.4 F (36.9 C), Min:98 F (36.7 C), Max:98.9 F (37.2 C)  Recent Labs  Lab 02/26/22 0904 02/26/22 1347 02/26/22 1555 02/26/22 1851 02/27/22 0020 02/27/22 1029 02/28/22 0251 03/01/22 0239 03/02/22 0535  WBC 21.2*  --   --   --  19.6* 21.3* 19.3* 13.9* 10.7*  CREATININE 1.04  --   --   --  0.88  --  0.98 1.01  --   LATICACIDVEN 3.6* 3.4* 3.8* 2.3* 1.7  --   --   --   --      Estimated Creatinine Clearance: 39.3 mL/min (by C-G formula based on SCr of 1.01 mg/dL).    Allergies  Allergen Reactions   Penicillins Other (See Comments)    Reaction is not listed on MAR    Nathan S. 03/04/22, PharmD, BCPS Clinical Staff Pharmacist [email or web address]  Merilynn Finland 03/02/2022 8:10 AM

## 2022-03-02 NOTE — Progress Notes (Signed)
   Progress Note  Patient Name: Nathan Gerstner [name] Date of Encounter: 03/02/2022  Primary Cardiologist: Lewayne Bunting, MD  Interval chart reviewed since rounds yesterday.  Patient transferred to telemetry.  Afebrile and in sinus rhythm with recent systolics 130s to 150s.  Started back on Cozaar yesterday with addition of Aldactone, continues on Coreg and aspirin as well.  Pertinent lab work includes potassium 3.7, BUN 32, creatinine 0.92, hemoglobin stable at 9.9.  I personally reviewed his ECG from yesterday which shows sinus rhythm with left anterior fascicular block, PVCs, nonspecific ST-T changes.  Continue current cardiac regimen.  If blood pressure stays up, consider either increasing Cozaar to 50 mg daily or switching to Entresto.  Signed, Nona Dell, MD  03/02/2022, 10:18 AM

## 2022-03-02 NOTE — Plan of Care (Signed)
  Problem: Education: Goal: Ability to describe self-care measures that may prevent or decrease complications (Diabetes Survival Skills Education) will improve Outcome: Progressing Goal: Individualized Educational Video(s) Outcome: Progressing   Problem: Coping: Goal: Ability to adjust to condition or change in health will improve Outcome: Progressing   Problem: Fluid Volume: Goal: Ability to maintain a balanced intake and output will improve Outcome: Progressing   Problem: Health Behavior/Discharge Planning: Goal: Ability to identify and utilize available resources and services will improve Outcome: Progressing Goal: Ability to manage health-related needs will improve Outcome: Progressing   Problem: Metabolic: Goal: Ability to maintain appropriate glucose levels will improve Outcome: Progressing   Problem: Nutritional: Goal: Maintenance of adequate nutrition will improve Outcome: Progressing Goal: Progress toward achieving an optimal weight will improve Outcome: Progressing   Problem: Skin Integrity: Goal: Risk for impaired skin integrity will decrease Outcome: Progressing   Problem: Tissue Perfusion: Goal: Adequacy of tissue perfusion will improve Outcome: Progressing   Problem: Fluid Volume: Goal: Hemodynamic stability will improve Outcome: Progressing   Problem: Clinical Measurements: Goal: Diagnostic test results will improve Outcome: Progressing Goal: Signs and symptoms of infection will decrease Outcome: Progressing   Problem: Respiratory: Goal: Ability to maintain adequate ventilation will improve Outcome: Progressing   Problem: Education: Goal: Knowledge of General Education information will improve Description: Including pain rating scale, medication(s)/side effects and non-pharmacologic comfort measures Outcome: Progressing   Problem: Health Behavior/Discharge Planning: Goal: Ability to manage health-related needs will improve Outcome:  Progressing   Problem: Clinical Measurements: Goal: Ability to maintain clinical measurements within normal limits will improve Outcome: Progressing Goal: Will remain free from infection Outcome: Progressing Goal: Diagnostic test results will improve Outcome: Progressing Goal: Respiratory complications will improve Outcome: Progressing Goal: Cardiovascular complication will be avoided Outcome: Progressing   Problem: Activity: Goal: Risk for activity intolerance will decrease Outcome: Progressing   Problem: Nutrition: Goal: Adequate nutrition will be maintained Outcome: Progressing   Problem: Coping: Goal: Level of anxiety will decrease Outcome: Progressing   Problem: Nutrition: Goal: Adequate nutrition will be maintained Outcome: Progressing   Problem: Coping: Goal: Level of anxiety will decrease Outcome: Progressing   Problem: Elimination: Goal: Will not experience complications related to bowel motility Outcome: Progressing Goal: Will not experience complications related to urinary retention Outcome: Progressing   Problem: Safety: Goal: Ability to remain free from injury will improve Outcome: Progressing   Problem: Skin Integrity: Goal: Risk for impaired skin integrity will decrease Outcome: Progressing

## 2022-03-03 DIAGNOSIS — J189 Pneumonia, unspecified organism: Secondary | ICD-10-CM | POA: Diagnosis not present

## 2022-03-03 DIAGNOSIS — K529 Noninfective gastroenteritis and colitis, unspecified: Secondary | ICD-10-CM | POA: Diagnosis not present

## 2022-03-03 DIAGNOSIS — R7989 Other specified abnormal findings of blood chemistry: Secondary | ICD-10-CM | POA: Diagnosis not present

## 2022-03-03 DIAGNOSIS — A419 Sepsis, unspecified organism: Secondary | ICD-10-CM | POA: Diagnosis not present

## 2022-03-03 LAB — CULTURE, BLOOD (ROUTINE X 2)
Culture: NO GROWTH
Culture: NO GROWTH
Special Requests: ADEQUATE

## 2022-03-03 LAB — BASIC METABOLIC PANEL
Anion gap: 8 (ref 5–15)
BUN: 34 mg/dL — ABNORMAL HIGH (ref 8–23)
CO2: 20 mmol/L — ABNORMAL LOW (ref 22–32)
Calcium: 8.6 mg/dL — ABNORMAL LOW (ref 8.9–10.3)
Chloride: 112 mmol/L — ABNORMAL HIGH (ref 98–111)
Creatinine, Ser: 0.86 mg/dL (ref 0.61–1.24)
GFR, Estimated: >60 mL/min (ref >60)
Glucose, Bld: 245 mg/dL — ABNORMAL HIGH (ref 70–99)
Potassium: 3.7 mmol/L (ref 3.5–5.1)
Sodium: 140 mmol/L (ref 135–145)

## 2022-03-03 LAB — CBC WITH DIFFERENTIAL/PLATELET
Abs Immature Granulocytes: 0.06 10*3/uL (ref 0.00–0.07)
Basophils Absolute: 0.1 10*3/uL (ref 0.0–0.1)
Basophils Relative: 1 %
Eosinophils Absolute: 0.4 10*3/uL (ref 0.0–0.5)
Eosinophils Relative: 4 %
HCT: 33.9 % — ABNORMAL LOW (ref 39.0–52.0)
Hemoglobin: 10.9 g/dL — ABNORMAL LOW (ref 13.0–17.0)
Immature Granulocytes: 1 %
Lymphocytes Relative: 14 %
Lymphs Abs: 1.5 10*3/uL (ref 0.7–4.0)
MCH: 29.8 pg (ref 26.0–34.0)
MCHC: 32.2 g/dL (ref 30.0–36.0)
MCV: 92.6 fL (ref 80.0–100.0)
Monocytes Absolute: 1.1 10*3/uL — ABNORMAL HIGH (ref 0.1–1.0)
Monocytes Relative: 10 %
Neutro Abs: 7.2 10*3/uL (ref 1.7–7.7)
Neutrophils Relative %: 70 %
Platelets: 207 10*3/uL (ref 150–400)
RBC: 3.66 MIL/uL — ABNORMAL LOW (ref 4.22–5.81)
RDW: 14.9 % (ref 11.5–15.5)
WBC: 10.3 10*3/uL (ref 4.0–10.5)
nRBC: 0 % (ref 0.0–0.2)

## 2022-03-03 LAB — GLUCOSE, CAPILLARY
Glucose-Capillary: 167 mg/dL — ABNORMAL HIGH (ref 70–99)
Glucose-Capillary: 197 mg/dL — ABNORMAL HIGH (ref 70–99)
Glucose-Capillary: 221 mg/dL — ABNORMAL HIGH (ref 70–99)
Glucose-Capillary: 155 mg/dL — ABNORMAL HIGH (ref 70–99)

## 2022-03-03 MED ORDER — VITAMIN D 25 MCG (1000 UNIT) PO TABS
2000.0000 [IU] | ORAL_TABLET | Freq: Every day | ORAL | Status: DC
  Administered 2022-03-03 – 2022-03-05 (×3): 2000 [IU] via ORAL
  Filled 2022-03-03 (×3): qty 2

## 2022-03-03 MED ORDER — VITAMIN D3 50 MCG (2000 UT) PO TABS: 2000.0000 [IU] | ORAL_TABLET | Freq: Every day | ORAL | Status: DC

## 2022-03-03 MED ORDER — PAROXETINE HCL 10 MG PO TABS
10.0000 mg | ORAL_TABLET | Freq: Every day | ORAL | Status: DC
  Administered 2022-03-03 – 2022-03-05 (×3): 10 mg via ORAL
  Filled 2022-03-03 (×3): qty 1

## 2022-03-03 MED ORDER — INSULIN ASPART 100 UNIT/ML IJ SOLN
3.0000 [IU] | Freq: Once | INTRAMUSCULAR | Status: AC
  Administered 2022-03-03: 3 [IU] via SUBCUTANEOUS

## 2022-03-03 MED ORDER — INSULIN ASPART 100 UNIT/ML IJ SOLN
0.0000 [IU] | Freq: Three times a day (TID) | INTRAMUSCULAR | Status: DC
  Administered 2022-03-03 (×2): 2 [IU] via SUBCUTANEOUS
  Administered 2022-03-03: 3 [IU] via SUBCUTANEOUS
  Administered 2022-03-04 (×2): 1 [IU] via SUBCUTANEOUS
  Administered 2022-03-05: 2 [IU] via SUBCUTANEOUS

## 2022-03-03 MED ORDER — INSULIN GLARGINE-YFGN 100 UNIT/ML ~~LOC~~ SOLN
15.0000 [IU] | Freq: Every day | SUBCUTANEOUS | Status: DC
  Administered 2022-03-03 – 2022-03-05 (×3): 15 [IU] via SUBCUTANEOUS
  Filled 2022-03-03 (×3): qty 0.15

## 2022-03-03 MED ORDER — FERROUS SULFATE 325 (65 FE) MG PO TABS
325.0000 mg | ORAL_TABLET | Freq: Every day | ORAL | Status: DC
  Administered 2022-03-03 – 2022-03-05 (×3): 325 mg via ORAL
  Filled 2022-03-03 (×3): qty 1

## 2022-03-03 MED ORDER — CEFDINIR 300 MG PO CAPS
300.0000 mg | ORAL_CAPSULE | Freq: Two times a day (BID) | ORAL | Status: DC
  Administered 2022-03-03 – 2022-03-05 (×5): 300 mg via ORAL
  Filled 2022-03-03 (×6): qty 1

## 2022-03-03 NOTE — Progress Notes (Signed)
PROGRESS NOTE    Nathan Resnick [name]  FMB:[id number] DOB: 1924-06-06 DOA: 02/26/2022 PCP: Jacelyn Pi, NP    Chief Complaint  Patient presents with   Shortness of Breath    Brief Narrative:  HPI per Dr. York Grice [name] is a 88 y.o. male with medical history significant of DM2, GERD, HTN, HLD, SSS s/p PPM. Presenting with shortness of breath and cough. He reports that he's had shortness of breath for over a week. He was diagnosed with PNA in the outpatient setting and started on an abx. He is unable to tell me which one he had. This morning, he states his coughing got much worse. He didn't have any fever or sick contacts. When his symptoms did not improve, the staff at his ALF called for EMS. He denies any other aggravating or alleviating factors.    Workup done on admission including CT chest abdomen and pelvis concerning for multifocal pneumonia and sigmoid colitis. -Patient placed empirically on IV antibiotics. -Patient also noted on admission to have low CBGs and placed on a D10 drip. -Patient noted to have elevated troponins, cardiology consulted and following.    Assessment & Plan:  Principal Problem:   Sepsis ([place]) Active Problems:   Colitis   Hypertension   Diabetes ([place])   Sinus node dysfunction (HCC)   Multifocal pneumonia   Elevated troponin   GERD (gastroesophageal reflux disease)    Assessment and Plan: * Sepsis ([place]) -Patient admitted and met criteria for sepsis with on admission with fever, tachycardia, leukocytosis with a white count of 21.2, CT chest concerning for multifocal pneumonia and colitis. -Blood cultures ordered and pending. -MRSA PCR negative.  COVID-19 PCR negative.  Influenza A and B PCR negative. -Urine cultures negative. -Blood cultures negative with no growth to date. -GI pathogen panel negative. -Fever curve trending down. -Leukocytosis trending down. -IV vancomycin has been discontinued. -Patient transition from IV  Flagyl to oral Flagyl on 03/02/2022. -Discontinue IV cefepime and placed on Omnicef for 3-4 more days to complete a course of antibiotic treatment. -Supportive care.  Colitis -Stool studies for C. difficile negative -GI pathogen panel negative. -Was on IV cefepime and Flagyl and transition to oral Flagyl.   -Changed from IV cefepime to oral Omnicef to complete course of antibiotic treatment.   -Contact precautions discontinued.    GERD (gastroesophageal reflux disease) -PPI.  Elevated troponin -Patient noted to have elevated troponins, seen by cardiology and concern for type II MI secondary to pneumonia. -2D echo done with EF of 30 to 35% and currently off BiPAP. -Patient seen in consultation by cardiology recommending addition of aspirin, carvedilol, losartan once comfortable from her respiratory perspective. -Resumed home regimen Coreg,  -Cozaar resumed per cardiology and Aldactone added to patient's regimen. -Status post 48 hours of IV heparin which has subsequently been discontinued.  -Cardiology following and appreciate input and recommendations.  Multifocal pneumonia -Noted on CT chest. -Patient presented with sepsis and CT chest concerning for multifocal pneumonia. -Patient with improving rhonchorous gurgling sounds on examination. -BNP noted to be elevated. -Urine Legionella antigen pending, urine pneumococcus antigen pending.  -Blood cultures pending.  -MRSA PCR negative for/vancomycin discontinued.   -Continue Mucinex 1200 twice daily, Claritin, Flonase. -Changed IV Flagyl to oral Flagyl. -Discontinue IV cefepime and placed on Omnicef to complete course of antibiotic treatment. -Continue PPI. -Status post Lasix 20 mg IV every 12 hours x 2 doses with good urine output and clinical improvement.  Sinus node dysfunction (HCC) -Status post PPM. -  BP initially noted to be soft on admission however improving. -Coreg resumed.   -Patient placed back on home regimen Cozaar and  Aldactone added per cardiology.  -Cardiology following.  Diabetes ([place]) -Hemoglobin A1c 6.0 (03/17/2021)\ -Repeat hemoglobin A1c at 6.4 (02/27/2022). -Patient noted to have bouts of hypoglycemia and as such was on a D10 drip. -Patient started on a dysphagia 3 diet, CBGs/hypoglycemia improving and as such D10 changed to D5 normal saline.  -D5 normal saline discontinued, CBG noted to elevated to 402 last night.   -Started on long-acting Semglee at decreased home dose of 15 units daily.   -SSI.   Hypertension -Blood pressure noted to be soft on admission and in the morning of 02/27/2022, and as such patient ordered a dose of IV albumin. -Blood pressure noted to be elevated in the 170s to 180s on 02/27/2022 mid morning to afternoon.  -Due to concerns for volume overload patient placed on Lasix 20 mg IV every 12 hours x 2 doses with a good urine output.   -BNP noted to be elevated.   -Coreg resumed. -Patient started back on Cozaar and Aldactone per cardiology. -Defer up titration of medications to cardiology.         DVT prophylaxis: Heparin drip>>> Lovenox Code Status: Partial Family Communication: Updated patient.  No family at bedside. Disposition: Likely back to assisted living facility with home health versus SNF tomorrow.  Status is: Inpatient Remains inpatient appropriate because: Severity of illness   Consultants:  Cardiology: Dr. Marlou Porch 02/26/2022  Procedures:  CT abdomen and pelvis 02/26/2022 CT angiogram chest 02/26/2022  Antimicrobials:  IV cefepime 02/26/2022>> 03/03/2022 IV Flagyl 02/26/2022>>>> oral Flagyl 03/02/2022>>> IV vancomycin 02/26/2022>>> 02/27/2022 Omnicef 03/03/2022>>>> 03/06/2022     Subjective: Sitting up in chair.  Feeling better.  Cough improved.  Shortness of breath improved.  No abdominal pain.  Tolerating diet.   Objective: Vitals:   03/02/22 2039 03/03/22 0632 03/03/22 0826 03/03/22 1418  BP:  (!) 163/72  (!) 153/70  Pulse:  70   94  Resp:  18  18  Temp:  97.8 F (36.6 C)  98.4 F (36.9 C)  TempSrc:  Oral  Oral  SpO2: 93% 96% 96% 97%  Weight:  61.6 kg    Height:        Intake/Output Summary (Last 24 hours) at 03/03/2022 1447 Last data filed at 03/03/2022 1023 Gross per 24 hour  Intake 238 ml  Output 350 ml  Net -112 ml   Filed Weights   02/28/22 0500 03/02/22 0500 03/03/22 0632  Weight: 60.5 kg 62.2 kg 61.6 kg    Examination:  General exam: NAD. Respiratory system: Improved diffuse coarse rhonchorous breath sounds.  Minimal wheezing.  Fair air movement.  Speaking in full sentences.   Cardiovascular system: RRR no murmurs rubs or gallops.  No JVD.  No lower extremity edema.  Gastrointestinal system: Abdomen is soft, nontender, nondistended, positive bowel sounds.  No rebound.  No guarding.  Central nervous system: Alert and oriented. No focal neurological deficits. Extremities: Symmetric 5 x 5 power. Skin: No rashes, lesions or ulcers Psychiatry: Judgement and insight appear normal. Mood & affect appropriate.     Data Reviewed:   CBC: Recent Labs  Lab 02/26/22 0904 02/27/22 0020 02/27/22 1029 02/28/22 0251 03/01/22 0239 03/02/22 0535 03/03/22 0418  WBC 21.2*   < > 21.3* 19.3* 13.9* 10.7* 10.3  NEUTROABS 16.8*  --   --  15.1* 10.5* 7.9* 7.2  HGB 12.7*   < > 10.8* 10.7*  9.9* 9.9* 10.9*  HCT 39.9   < > 33.5* 32.6* 31.0* 30.4* 33.9*  MCV 94.5   < > 93.3 92.1 93.7 92.1 92.6  PLT 323   < > 212 201 183 186 207   < > = values in this interval not displayed.    Basic Metabolic Panel: Recent Labs  Lab 02/26/22 0904 02/27/22 0020 02/28/22 0251 03/01/22 0239 03/02/22 0535 03/03/22 0418  NA 137 137 134* 136 140 140  K 4.7 4.0 3.8 4.0 3.7 3.7  CL 105 110 104 107 110 112*  CO2 23 22 18* 21* 20* 20*  GLUCOSE 228* 91 189* 154* 127* 245*  BUN 49* 39* 29* 34* 32* 34*  CREATININE 1.04 0.88 0.98 1.01 0.92 0.86  CALCIUM 9.0 7.9* 7.9* 8.1* 8.4* 8.6*  MG 2.1  --  1.8 1.9  --   --      GFR: Estimated Creatinine Clearance: 45.8 mL/min (by C-G formula based on SCr of 0.86 mg/dL).  Liver Function Tests: Recent Labs  Lab 02/26/22 0904 02/27/22 0020 02/28/22 0251  AST _0 ALT _1 ALKPHOS 63 43 52  BILITOT 0.4 0.7 0.6  PROT 8.1 5.5* 6.1*  ALBUMIN 4.0 2.8* 2.9*    CBG: Recent Labs  Lab 03/02/22 1156 03/02/22 1726 03/02/22 2342 03/03/22 0909 03/03/22 1128  GLUCAP 212* 395* 402* 167* 197*     Recent Results (from the past 240 hour(s))  Blood Culture (routine x 2)     Status: None   Collection Time: 02/26/22  9:09 AM   Specimen: BLOOD  Result Value Ref Range Status   Specimen Description   Final    BLOOD SITE NOT SPECIFIED Performed at [facility] [address], [place], [place] [address]    Special Requests   Final    BOTTLES DRAWN AEROBIC AND ANAEROBIC Blood Culture adequate volume Performed at [facility] [address], [place], [place] [address]    Culture   Final    NO GROWTH 5 DAYS Performed at [facility], [place] [address] [place], [place] [address]    Report Status 03/03/2022 FINAL  Final  Blood Culture (routine x 2)     Status: None   Collection Time: 02/26/22  9:23 AM   Specimen: BLOOD RIGHT ARM  Result Value Ref Range Status   Specimen Description   Final    BLOOD RIGHT ARM Performed at [facility], [place] [address] [place], [place] [address]    Special Requests   Final    BOTTLES DRAWN AEROBIC AND ANAEROBIC Blood Culture results may not be optimal due to an excessive volume of blood received in culture bottles Performed at [facility] [address], [place], [place] [address]    Culture   Final    NO GROWTH 5 DAYS Performed at [facility], [place] [address], [place], [place] [address]    Report Status 03/03/2022 FINAL  Final  Resp Panel by RT-PCR (Flu A&B, Covid) Anterior Nasal Swab     Status: None   Collection Time: 02/26/22  9:52  AM   Specimen: Anterior Nasal Swab  Result Value Ref Range Status   SARS Coronavirus 2 by RT PCR NEGATIVE NEGATIVE Final    Comment: (NOTE) SARS-CoV-2 target nucleic acids are NOT DETECTED.  The SARS-CoV-2 RNA is generally detectable in upper respiratory specimens during the acute phase of infection. The lowest concentration of SARS-CoV-2 viral  copies this assay can detect is 138 copies/mL. A negative result does not preclude SARS-Cov-2 infection and should not be used as the sole basis for treatment or other patient management decisions. A negative result may occur with  improper specimen collection/handling, submission of specimen other than nasopharyngeal swab, presence of viral mutation(s) within the areas targeted by this assay, and inadequate number of viral copies(<138 copies/mL). A negative result must be combined with clinical observations, patient history, and epidemiological information. The expected result is Negative.  Fact Sheet for Patients:  EntrepreneurPulse.com.au  Fact Sheet for Healthcare Providers:  IncredibleEmployment.be  This test is no t yet approved or cleared by the [place] FDA and  has been authorized for detection and/or diagnosis of SARS-CoV-2 by FDA under an Emergency Use Authorization (EUA). This EUA will remain  in effect (meaning this test can be used) for the duration of the COVID-19 declaration under Section 564(b)(1) of the Act, 21 U.S.C.section 360bbb-3(b)(1), unless the authorization is terminated  or revoked sooner.       Influenza A by PCR NEGATIVE NEGATIVE Final   Influenza B by PCR NEGATIVE NEGATIVE Final    Comment: (NOTE) The Xpert Xpress SARS-CoV-2/FLU/RSV plus assay is intended as an aid in the diagnosis of influenza from Nasopharyngeal swab specimens and should not be used as a sole basis for treatment. Nasal washings and aspirates are unacceptable for Xpert Xpress  SARS-CoV-2/FLU/RSV testing.  Fact Sheet for Patients: EntrepreneurPulse.com.au  Fact Sheet for Healthcare Providers: IncredibleEmployment.be  This test is not yet approved or cleared by the [place] FDA and has been authorized for detection and/or diagnosis of SARS-CoV-2 by FDA under an Emergency Use Authorization (EUA). This EUA will remain in effect (meaning this test can be used) for the duration of the COVID-19 declaration under Section 564(b)(1) of the Act, 21 U.S.C. section 360bbb-3(b)(1), unless the authorization is terminated or revoked.  Performed at [facility], [place] [address] [place], [place] [address]   C Difficile Quick Screen w PCR reflex     Status: None   Collection Time: 02/26/22 10:32 AM   Specimen: STOOL  Result Value Ref Range Status   C Diff antigen NEGATIVE NEGATIVE Final   C Diff toxin NEGATIVE NEGATIVE Final   C Diff interpretation No C. difficile detected.  Final    Comment: Performed at [facility], [place] [address], [place], [place] [address]  MRSA Next Gen by PCR, Nasal     Status: None   Collection Time: 02/26/22  1:32 PM   Specimen: Nasal Mucosa; Nasal Swab  Result Value Ref Range Status   MRSA by PCR Next Gen NOT DETECTED NOT DETECTED Final    Comment: (NOTE) The GeneXpert MRSA Assay (FDA approved for NASAL specimens only), is one component of a comprehensive MRSA colonization surveillance program. It is not intended to diagnose MRSA infection nor to guide or monitor treatment for MRSA infections. Test performance is not FDA approved in patients less than 19 years old. Performed at [facility], [place] [address], [place],  [address]   Gastrointestinal Panel by PCR , Stool     Status: None   Collection Time: 02/27/22 10:31 AM   Specimen: Stool  Result Value Ref Range Status   Campylobacter species NOT DETECTED NOT DETECTED Final    Plesimonas shigelloides NOT DETECTED NOT DETECTED Final   Salmonella species NOT DETECTED NOT DETECTED Final   Yersinia enterocolitica NOT DETECTED NOT DETECTED Final   Vibrio species  NOT DETECTED NOT DETECTED Final   Vibrio cholerae NOT DETECTED NOT DETECTED Final   Enteroaggregative E coli (EAEC) NOT DETECTED NOT DETECTED Final   Enteropathogenic E coli (EPEC) NOT DETECTED NOT DETECTED Final   Enterotoxigenic E coli (ETEC) NOT DETECTED NOT DETECTED Final   Shiga like toxin producing E coli (STEC) NOT DETECTED NOT DETECTED Final   Shigella/Enteroinvasive E coli (EIEC) NOT DETECTED NOT DETECTED Final   Cryptosporidium NOT DETECTED NOT DETECTED Final   Cyclospora cayetanensis NOT DETECTED NOT DETECTED Final   Entamoeba histolytica NOT DETECTED NOT DETECTED Final   Giardia lamblia NOT DETECTED NOT DETECTED Final   Adenovirus F40/41 NOT DETECTED NOT DETECTED Final   Astrovirus NOT DETECTED NOT DETECTED Final   Norovirus GI/GII NOT DETECTED NOT DETECTED Final   Rotavirus A NOT DETECTED NOT DETECTED Final   Sapovirus (I, II, IV, and V) NOT DETECTED NOT DETECTED Final    Comment: Performed at [facility], [address] [place], [place] [address]  Urine Culture     Status: None   Collection Time: 02/27/22  5:51 PM   Specimen: Urine, Catheterized  Result Value Ref Range Status   Specimen Description   Final    URINE, CATHETERIZED Performed at [facility], [place] [address], [place], [place] [address]    Special Requests   Final    NONE Performed at [facility], [place] [address], [place], [place] [address]    Culture   Final    NO GROWTH Performed at [facility], [place] [address], [place], [place] [address]    Report Status 02/28/2022 FINAL  Final         Radiology Studies: No results found.      Scheduled Meds:  aspirin EC  81 mg Oral Daily   carvedilol  3.125 mg Oral BID WC   cefdinir  300 mg Oral Q12H   Chlorhexidine  Gluconate Cloth  6 each Topical Q0600   cholecalciferol  2,000 Units Oral Daily   enoxaparin (LOVENOX) injection  40 mg Subcutaneous Q24H   ferrous sulfate  325 mg Oral Daily   fluticasone  2 spray Each Nare Daily   guaiFENesin  1,200 mg Oral BID   insulin aspart  0-9 Units Subcutaneous TID WC   insulin glargine-yfgn  15 Units Subcutaneous Daily   levalbuterol  0.63 mg Nebulization TID   loratadine  10 mg Oral Daily   losartan  25 mg Oral Daily   metroNIDAZOLE  500 mg Oral Q12H   mouth rinse  15 mL Mouth Rinse 4 times per day   pantoprazole  40 mg Oral Daily   PARoxetine  10 mg Oral Daily   spironolactone  12.5 mg Oral Daily   Continuous Infusions:  albumin human       LOS: 5 days    Time spent: 40 minutes    Irine Seal, MD [facility]   To contact the attending provider between 7A-7P or the covering provider during after hours 7P-7A, please log into the web site [email or web address] and access using universal [place] password for that web site. If you do not have the password, please call the hospital operator.  03/03/2022, 2:47 PM

## 2022-03-03 NOTE — Plan of Care (Signed)

## 2022-03-03 NOTE — TOC Initial Note (Signed)
Transition of Care (TOC) - Initial/Assessment Note    Patient Details  Name: Nathan Hinks [name] MRN: [id number] Date of Birth: October 10, 1924  Transition of Care [facility]) CM/SW Contact:    Vassie Moselle, LCSW Phone Number: 03/03/2022, 11:53 AM  Clinical Narrative:                 Met with pt and son at bedside and confirmed plan to return to [facility] w/ home health services. Pt will need PTAR to transport back to ALF as pt's son will not be able to transport him in his truck.  CSW spoke with Mariann Laster at [facility] who shares that pt is able to return. FL-2 and DC summary will need to be faxed to [phone] prior to pt returning. Pt will be able to receive HHPOT/OT from [facility] in house [place].   Expected Discharge Plan: Assisted Living (w/ Home health) Barriers to Discharge: No Barriers Identified   Patient Goals and CMS Choice Patient states their goals for this hospitalization and ongoing recovery are:: To return to [facility] CMS Medicare.gov Compare Post Acute Care list provided to:: Patient Choice offered to / list presented to : Patient  Expected Discharge Plan and Services Expected Discharge Plan: Assisted Living (w/ Home health) In-house Referral: NA Discharge Planning Services: CM Consult Post Acute Care Choice: [place] arrangements for the past 2 months: [place]                 DME Arranged: N/A DME Agency: NA       HH Arranged: PT, OT HH Agency: [facility] Date [place]: 03/03/22 Time [place]: 62 Representative spoke with at [place]: Claiborne Billings  Prior Living Arrangements/Services Living arrangements for the past 2 months: [place] Lives with:: Facility Resident Patient language and need for interpreter reviewed:: Yes Do you feel safe going back to the place where you live?: Yes      Need for Family Participation in Patient Care: No (Comment) Care giver  support system in place?: Yes (comment) Current home services: DME (RW; 3in1) Criminal Activity/Legal Involvement Pertinent to Current Situation/Hospitalization: No - Comment as needed  Activities of Daily Living Home Assistive Devices/Equipment: None ADL Screening (condition at time of admission) Patient's cognitive ability adequate to safely complete daily activities?: Yes Is the patient deaf or have difficulty hearing?: No Does the patient have difficulty seeing, even when wearing glasses/contacts?: No Does the patient have difficulty concentrating, remembering, or making decisions?: No Patient able to express need for assistance with ADLs?: Yes Does the patient have difficulty dressing or bathing?: No Independently performs ADLs?: Yes (appropriate for developmental age) Does the patient have difficulty walking or climbing stairs?: No Weakness of Legs: Both Weakness of Arms/Hands: None  Permission Sought/Granted Permission sought to share information with : Facility Sport and exercise psychologist, Family Supports Permission granted to share information with : Yes, Verbal Permission Granted  Share Information with NAME: Nathan Alvarez  Permission granted to share info w AGENCY: [facility] and [employer] granted to share info w Relationship: Son  Permission granted to share info w Contact Information: [phone]  Emotional Assessment Appearance:: Appears stated age Attitude/Demeanor/Rapport: Engaged Affect (typically observed): Accepting, Pleasant Orientation: : Oriented to Self, Oriented to Place, Oriented to  Time, Oriented to Situation Alcohol / Substance Use: Not Applicable Psych Involvement: No (comment)  Admission diagnosis:  Sepsis ([place]) [A41.9] Multifocal pneumonia [J18.9] Sepsis with acute hypoxic  respiratory failure, due to unspecified organism, unspecified whether septic shock present (HCC) [A41.9, R65.20, J96.01] Patient Active Problem List   Diagnosis  Date Noted   Sepsis (HCC) 02/26/2022   Elevated troponin 02/26/2022   GERD (gastroesophageal reflux disease) 02/26/2022   Dysphagia    Abnormal barium swallow    Acute systolic heart failure (HCC)    Influenza A 03/17/2021   Hypotension 03/17/2021   Septic shock (HCC)    Acute respiratory failure with hypoxia (HCC)    Lactic acidosis    Weakness generalized    Multifocal pneumonia 01/21/2021   Pressure injury of skin 01/21/2021   Fall    Aspiration pneumonia (HCC) 12/15/2020   AKI (acute kidney injury) (HCC) 12/14/2020   Colitis 12/14/2020   Sinus node dysfunction (HCC) 03/06/2020   Pacemaker 03/06/2020   Benign prostatic hyperplasia with nocturia 05/23/2019   Vitamin D deficiency 04/25/2019   Pathological fracture of left hip due to age-related osteoporosis (HCC) 04/25/2019   Femoral neck fracture (HCC) 04/21/2019   Hypertension 04/21/2019   Diabetes (HCC) 04/21/2019   Hyponatremia 04/21/2019   Anemia 04/21/2019   Hypertension, essential, benign 07/13/2017   PCP:  Nguyen, Hong Thu T, NP Pharmacy:   [facility] - [place], [place] - [address] [place] [address] Phone: [phone] Fax: [phone]  [facility] [address] [place]  [address] Phone: [phone] Fax: [phone]     Social Determinants of Health (SDOH) Interventions    Readmission Risk Interventions    03/03/2022   11:51 AM  Readmission Risk Prevention Plan  Transportation Screening Complete  PCP or Specialist Appt within 5-7 Days Complete  Home Care Screening Complete  Medication Review (RN CM) Complete     

## 2022-03-03 NOTE — [place] FL2 (Signed)
MEDICAID FL2 LEVEL OF CARE SCREENING TOOL     IDENTIFICATION  Patient Name: Nathan Ellerman [name] Birthdate: 1924-05-26 Sex: male Admission Date (Current Location): 02/26/2022  [facility] and [place] Number:  Producer, television/film/video and Address:  [facility],  [address] [place], [place] [address]      Provider Number: [id number]  Attending Physician Name and Address:  Rodolph Bong, MD  Relative Name and Phone Number:  Nathan Alvarez [name] [phone]    Current Level of Care: Hospital Recommended Level of Care: Assisted Living Facility Prior Approval Number:    Date Approved/Denied:   PASRR Number:    Discharge Plan: Other (Comment) (ALF)    Current Diagnoses: Patient Active Problem List   Diagnosis Date Noted   Sepsis (HCC) 02/26/2022   Elevated troponin 02/26/2022   GERD (gastroesophageal reflux disease) 02/26/2022   Dysphagia    Abnormal barium swallow    Acute systolic heart failure (HCC)    Influenza A 03/17/2021   Hypotension 03/17/2021   Septic shock (HCC)    Acute respiratory failure with hypoxia (HCC)    Lactic acidosis    Weakness generalized    Multifocal pneumonia 01/21/2021   Pressure injury of skin 01/21/2021   Fall    Aspiration pneumonia (HCC) 12/15/2020   AKI (acute kidney injury) (HCC) 12/14/2020   Colitis 12/14/2020   Sinus node dysfunction (HCC) 03/06/2020   Pacemaker 03/06/2020   Benign prostatic hyperplasia with nocturia 05/23/2019   Vitamin D deficiency 04/25/2019   Pathological fracture of left hip due to age-related osteoporosis (HCC) 04/25/2019   Femoral neck fracture (HCC) 04/21/2019   Hypertension 04/21/2019   Diabetes (HCC) 04/21/2019   Hyponatremia 04/21/2019   Anemia 04/21/2019   Hypertension, essential, benign 07/13/2017    Orientation RESPIRATION BLADDER Height & Weight     Self, Time, Situation, Place  Normal Continent Weight: 135 lb 12.9 oz (61.6 kg) Height:  5\' 7"  (170.2 cm)  BEHAVIORAL  SYMPTOMS/MOOD NEUROLOGICAL BOWEL NUTRITION STATUS      Continent Diet (Regular)  AMBULATORY STATUS COMMUNICATION OF NEEDS Skin   Limited Assist Verbally Normal                       Personal Care Assistance Level of Assistance  Bathing, Feeding, Dressing Bathing Assistance: Maximum assistance Feeding assistance: Limited assistance Dressing Assistance: Maximum assistance     Functional Limitations Info  Sight, Hearing, Speech Sight Info: Adequate Hearing Info: Adequate Speech Info: Adequate    SPECIAL CARE FACTORS FREQUENCY  PT (By licensed PT), OT (By licensed OT)     PT Frequency: 2-3x /wk OT Frequency: 2-3/ wk            Contractures Contractures Info: Not present    Additional Factors Info  Code Status, Allergies, Insulin Sliding Scale Code Status Info: Partial Allergies Info: Penicillins   Insulin Sliding Scale Info: See MAR       Current Medications (03/03/2022):  This is the current hospital active medication list Current Facility-Administered Medications  Medication Dose Route Frequency Provider Last Rate Last Admin   acetaminophen (TYLENOL) tablet 650 mg  650 mg Oral Q6H PRN 03/05/2022, Tyrone A, DO       Or   acetaminophen (TYLENOL) suppository 650 mg  650 mg Rectal Q6H PRN Ronaldo Miyamoto, Tyrone A, DO       albumin human 25 % solution 25 g  25 g Intravenous Once Ronaldo Miyamoto, MD  albuterol (PROVENTIL) (2.5 MG/3ML) 0.083% nebulizer solution 2.5 mg  2.5 mg Nebulization Q2H PRN Ronaldo Miyamoto, Tyrone A, DO       aspirin EC tablet 81 mg  81 mg Oral Daily Shariq, Puig, PA-C   81 mg at 03/03/22 0935   carvedilol (COREG) tablet 3.125 mg  3.125 mg Oral BID WC Seddrick, Flax, PA-C   3.125 mg at 03/03/22 0947   cefdinir (OMNICEF) capsule 300 mg  300 mg Oral Q12H Rodolph Bong, MD   300 mg at 03/03/22 0935   Chlorhexidine Gluconate Cloth 2 % PADS 6 each  6 each Topical Q0600 Margie Ege A, DO   6 each at 03/03/22 0557   cholecalciferol (VITAMIN D3) 25 MCG  (1000 UNIT) tablet 2,000 Units  2,000 Units Oral Daily Rodolph Bong, MD   2,000 Units at 03/03/22 0934   enoxaparin (LOVENOX) injection 40 mg  40 mg Subcutaneous Q24H Rodolph Bong, MD   40 mg at 03/02/22 1134   ferrous sulfate tablet 325 mg  325 mg Oral Daily Rodolph Bong, MD   325 mg at 03/03/22 0934   fluticasone (FLONASE) 50 MCG/ACT nasal spray 2 spray  2 spray Each Nare Daily Rodolph Bong, MD   2 spray at 03/03/22 0942   guaiFENesin (MUCINEX) 12 hr tablet 1,200 mg  1,200 mg Oral BID Rodolph Bong, MD   1,200 mg at 03/02/22 2248   HYDROcodone bit-homatropine (HYCODAN) 5-1.5 MG/5ML syrup 5 mL  5 mL Oral Q6H PRN Rodolph Bong, MD   5 mL at 03/03/22 0935   insulin aspart (novoLOG) injection 0-9 Units  0-9 Units Subcutaneous TID WC Rodolph Bong, MD   2 Units at 03/03/22 0943   insulin glargine-yfgn (SEMGLEE) injection 15 Units  15 Units Subcutaneous Daily Rodolph Bong, MD   15 Units at 03/03/22 0943   levalbuterol (XOPENEX) nebulizer solution 0.63 mg  0.63 mg Nebulization TID Rodolph Bong, MD   0.63 mg at 03/03/22 0826   loratadine (CLARITIN) tablet 10 mg  10 mg Oral Daily Rodolph Bong, MD   10 mg at 03/03/22 0934   losartan (COZAAR) tablet 25 mg  25 mg Oral Daily Jonelle Sidle, MD   25 mg at 03/03/22 0934   metroNIDAZOLE (FLAGYL) tablet 500 mg  500 mg Oral Q12H Rodolph Bong, MD   500 mg at 03/03/22 0962   Oral care mouth rinse  15 mL Mouth Rinse 4 times per day Margie Ege A, DO   15 mL at 03/02/22 1135   Oral care mouth rinse  15 mL Mouth Rinse PRN Ronaldo Miyamoto, Tyrone A, DO       pantoprazole (PROTONIX) EC tablet 40 mg  40 mg Oral Daily Kyle, Tyrone A, DO   40 mg at 03/03/22 0935   PARoxetine (PAXIL) tablet 10 mg  10 mg Oral Daily Rodolph Bong, MD   10 mg at 03/03/22 0935   spironolactone (ALDACTONE) tablet 12.5 mg  12.5 mg Oral Daily Jonelle Sidle, MD   12.5 mg at 03/03/22 0935     Discharge Medications: Please see  discharge summary for a list of discharge medications.  Relevant Imaging Results:  Relevant Lab Results:   Additional Information SSN: [id number]  Sheree Lalla A Maisen Schmit, LCSW

## 2022-03-03 NOTE — Progress Notes (Signed)
Mobility Specialist - Progress Note   03/03/22 1015  Mobility  Activity Transferred from bed to chair  Level of Assistance Minimal assist, patient does 75% or more  Assistive Device Front wheel walker  Distance Ambulated (ft) 5 ft  Activity Response Tolerated well  Mobility Referral Yes  $Mobility charge 1 Mobility   Pt received in bed and requested to sit in chair. Min A for bed mobility, Min A to sit to stand. Pt in chair with all needs met.  Roderick Pee Mobility Specialist

## 2022-03-03 NOTE — Progress Notes (Signed)
    OVERNIGHT PROGRESS REPORT Notified by RN of CBG > 400 ... Pt takes insulin for >450 at home.  Order in for coverage of current CBG   Nathan Alvarez MSNA MSN ACNPC-AG Acute Care Nurse Practitioner [facility]

## 2022-03-03 NOTE — Progress Notes (Signed)
Physical Therapy Treatment Patient Details Name: Nathan Korey [name] MRN: [id number] DOB: 1924-11-17 Today's Date: 03/03/2022   History of Present Illness Patient is a 88 year old male who presented with cough and SOB. Patient was noted to have had PNA diagnosis with start of treatment at ALF. When symptoms progressed EMS was called. Patient was found to have sepsis, colitis, elevated troponin, type II MI secondary to pneumonia. PMH: DM II, HTN, SSS s/p PPM, HLD.    PT Comments    Pt declined OOB 2* just returning to bed after sitting in chair this morning. He did agree to perform ROM exercises. Pt tolerated exercises well.    Recommendations for follow up therapy are one component of a multi-disciplinary discharge planning process, led by the attending physician.  Recommendations may be updated based on patient status, additional functional criteria and insurance authorization.  Follow Up Recommendations  Home health PT (at ALF)     Assistance Recommended at Discharge Intermittent Supervision/Assistance  Patient can return home with the following A little help with walking and/or transfers;A little help with bathing/dressing/bathroom;Assistance with cooking/housework;Assist for transportation;Help with stairs or ramp for entrance   Equipment Recommendations  None recommended by PT    Recommendations for Other Services       Precautions / Restrictions Precautions Precautions: Fall     Mobility  Bed Mobility                    Transfers                        Ambulation/Gait                   Stairs             Wheelchair Mobility    Modified Rankin (Stroke Patients Only)       Balance                                            Cognition Arousal/Alertness: Awake/alert Behavior During Therapy: WFL for tasks assessed/performed Overall Cognitive Status: Within Functional Limits for tasks assessed                                           Exercises General Exercises - Lower Extremity Ankle Circles/Pumps: AROM, Both, 10 reps Heel Slides: AROM, Both, 5 reps Hip ABduction/ADduction: AROM, Both, 5 reps Shoulder flexion, AROM, 5 reps, both Elbow flexion/ext, AROM, 5 reps, both    General Comments        Pertinent Vitals/Pain Pain Assessment Pain Assessment: No/denies pain    Home Living                          Prior Function            PT Goals (current goals can now be found in the care plan section) Progress towards PT goals: Progressing toward goals    Frequency    Min 3X/week      PT Plan Current plan remains appropriate    Co-evaluation              AM-PAC PT "6 Clicks" Mobility   Outcome Measure  Help needed turning from your  back to your side while in a flat bed without using bedrails?: A Little Help needed moving from lying on your back to sitting on the side of a flat bed without using bedrails?: A Little Help needed moving to and from a bed to a chair (including a wheelchair)?: A Little Help needed standing up from a chair using your arms (e.g., wheelchair or bedside chair)?: A Little Help needed to walk in hospital room?: A Lot Help needed climbing 3-5 steps with a railing? : Total 6 Click Score: 15    End of Session   Activity Tolerance: Patient tolerated treatment well Patient left: in bed;with call bell/phone within reach;with bed alarm set   PT Visit Diagnosis: Difficulty in walking, not elsewhere classified (R26.2);Muscle weakness (generalized) (M62.81)     Time: 0630-1601 PT Time Calculation (min) (ACUTE ONLY): 17 min  Charges:  $Therapeutic Exercise: 8-22 mins                         Faye Ramsay, PT Acute Rehabilitation  Office: [phone]

## 2022-03-03 NOTE — Progress Notes (Signed)
Progress Note  Patient Name: Nathan Jachim [name] Date of Encounter: 03/03/2022  Primary Cardiologist: Cristopher Peru, MD  Subjective   No chest tightness or palpitations.  Inpatient Medications    Scheduled Meds:  aspirin EC  81 mg Oral Daily   carvedilol  3.125 mg Oral BID WC   cefdinir  300 mg Oral Q12H   Chlorhexidine Gluconate Cloth  6 each Topical Q0600   cholecalciferol  2,000 Units Oral Daily   enoxaparin (LOVENOX) injection  40 mg Subcutaneous Q24H   ferrous sulfate  325 mg Oral Daily   fluticasone  2 spray Each Nare Daily   guaiFENesin  1,200 mg Oral BID   insulin aspart  0-9 Units Subcutaneous TID WC   insulin glargine-yfgn  15 Units Subcutaneous Daily   levalbuterol  0.63 mg Nebulization TID   loratadine  10 mg Oral Daily   losartan  25 mg Oral Daily   metroNIDAZOLE  500 mg Oral Q12H   mouth rinse  15 mL Mouth Rinse 4 times per day   pantoprazole  40 mg Oral Daily   PARoxetine  10 mg Oral Daily   spironolactone  12.5 mg Oral Daily   Continuous Infusions:  albumin human     PRN Meds: acetaminophen **OR** acetaminophen, albuterol, HYDROcodone bit-homatropine, mouth rinse   Vital Signs    Vitals:   03/02/22 2033 03/02/22 2039 03/03/22 0632 03/03/22 0826  BP: (!) 160/68  (!) 163/72   Pulse: 71  70   Resp: 20  18   Temp: 98.1 F (36.7 C)  97.8 F (36.6 C)   TempSrc:   Oral   SpO2: (!) 89% 93% 96% 96%  Weight:   61.6 kg   Height:        Intake/Output Summary (Last 24 hours) at 03/03/2022 1139 Last data filed at 03/03/2022 1023 Gross per 24 hour  Intake 238 ml  Output 350 ml  Net -112 ml   Filed Weights   02/28/22 0500 03/02/22 0500 03/03/22 0632  Weight: 60.5 kg 62.2 kg 61.6 kg    Telemetry    SR, frequent ectopy, atrial pacing.  Personally reviewed.  ECG    An ECG dated 03/01/22 was personally reviewed today and demonstrated:  Sinus rhythm, frequent ectopy, lateral T wave abnl.  Physical Exam   GEN: No acute distress.   Neck: No  JVD. Cardiac: RRR, 1/6 systolic murmur Respiratory: No wheezing. GI: Soft, nontender, bowel sounds present. MS: No edema; No deformity. Neuro:  Nonfocal. Psych: Alert and oriented x 3. Normal affect.  Labs    Chemistry Recent Labs  Lab 02/26/22 0904 02/27/22 0020 02/28/22 0251 03/01/22 0239 03/02/22 0535 03/03/22 0418  NA 137 137 134* 136 140 140  K 4.7 4.0 3.8 4.0 3.7 3.7  CL 105 110 104 107 110 112*  CO2 23 22 18* 21* 20* 20*  GLUCOSE 228* 91 189* 154* 127* 245*  BUN 49* 39* 29* 34* 32* 34*  CREATININE 1.04 0.88 0.98 1.01 0.92 0.86  CALCIUM 9.0 7.9* 7.9* 8.1* 8.4* 8.6*  PROT 8.1 5.5* 6.1*  --   --   --   ALBUMIN 4.0 2.8* 2.9*  --   --   --   AST 19 22 31   --   --   --   ALT 12 9 11   --   --   --   ALKPHOS 63 43 52  --   --   --   BILITOT 0.4 0.7 0.6  --   --   --  GFRNONAA >60 >60 >60 >60 >60 >60  ANIONGAP 9 5 12 8 10 8      Hematology Recent Labs  Lab 03/01/22 0239 03/02/22 0535 03/03/22 0418  WBC 13.9* 10.7* 10.3  RBC 3.31* 3.30* 3.66*  HGB 9.9* 9.9* 10.9*  HCT 31.0* 30.4* 33.9*  MCV 93.7 92.1 92.6  MCH 29.9 30.0 29.8  MCHC 31.9 32.6 32.2  RDW 14.6 14.7 14.9  PLT 183 186 207    Cardiac Enzymes Recent Labs  Lab 02/26/22 0904 02/26/22 1350  TROPONINIHS 57* 1,387*    BNP Recent Labs  Lab 02/26/22 0904 02/27/22 1025 02/28/22 0251  BNP 428.6* 1,443.2* 783.1*     Radiology    No results found.  Cardiac Studies   Echocardiogram 02/26/2022:  1. Left ventricular ejection fraction, by estimation, is 30 to 35%. The  left ventricle has moderately decreased function. The left ventricle  demonstrates global hypokinesis. There is severe asymmetric left  ventricular hypertrophy of the basal-septal  segment. Left ventricular diastolic parameters are consistent with Grade I  diastolic dysfunction (impaired relaxation).   2. Right ventricular systolic function is normal. The right ventricular  size is mildly enlarged. There is mildly elevated  pulmonary artery  systolic pressure.   3. The mitral valve is grossly normal. No evidence of mitral valve  regurgitation. No evidence of mitral stenosis.   4. The aortic valve was not well visualized. Aortic valve regurgitation  is not visualized.   Assessment & Plan    1.  Type II NSTEMI in the setting of pneumonia, peak high-sensitivity troponin I 1443.  Completed 48-hour course of IV heparin.  No active chest pain.  Currently on aspirin.  2.  HFrEF, chronic with LVEF 30 to 35% (35 to 40% December 2022).  He has diuresed with as needed IV Lasix.  Low-dose Coreg resumed.  Was also on Cozaar as an outpatient, resumed, consider uptitration to 50 mg daily for SBP 150-160 mmHg.    3.  Sinus node dysfunction with [employer] pacemaker in place followed by Dr. January 2023.  4.  Paroxysmal atrial fibrillation by history.  None noted on last device interrogation. ECG SR with ectopy.  He has not been anticoagulated over time and this was not planned per recent review of Dr. Ladona Ridgel note.  CHA2DS2-VASc score is 6.   Signed, Anne Fu, MD  03/03/2022, 11:39 AM

## 2022-03-04 DIAGNOSIS — K529 Noninfective gastroenteritis and colitis, unspecified: Secondary | ICD-10-CM | POA: Diagnosis not present

## 2022-03-04 DIAGNOSIS — A419 Sepsis, unspecified organism: Secondary | ICD-10-CM | POA: Diagnosis not present

## 2022-03-04 DIAGNOSIS — J189 Pneumonia, unspecified organism: Secondary | ICD-10-CM | POA: Diagnosis not present

## 2022-03-04 DIAGNOSIS — R7989 Other specified abnormal findings of blood chemistry: Secondary | ICD-10-CM | POA: Diagnosis not present

## 2022-03-04 LAB — BASIC METABOLIC PANEL
Anion gap: 7 (ref 5–15)
BUN: 29 mg/dL — ABNORMAL HIGH (ref 8–23)
CO2: 22 mmol/L (ref 22–32)
Calcium: 8.4 mg/dL — ABNORMAL LOW (ref 8.9–10.3)
Chloride: 113 mmol/L — ABNORMAL HIGH (ref 98–111)
Creatinine, Ser: 0.76 mg/dL (ref 0.61–1.24)
GFR, Estimated: >60 mL/min (ref >60)
Glucose, Bld: 144 mg/dL — ABNORMAL HIGH (ref 70–99)
Potassium: 3.4 mmol/L — ABNORMAL LOW (ref 3.5–5.1)
Sodium: 142 mmol/L (ref 135–145)

## 2022-03-04 LAB — CBC WITH DIFFERENTIAL/PLATELET
Abs Immature Granulocytes: 0.11 10*3/uL — ABNORMAL HIGH (ref 0.00–0.07)
Basophils Absolute: 0.1 10*3/uL (ref 0.0–0.1)
Basophils Relative: 1 %
Eosinophils Absolute: 0.4 10*3/uL (ref 0.0–0.5)
Eosinophils Relative: 3 %
HCT: 31.8 % — ABNORMAL LOW (ref 39.0–52.0)
Hemoglobin: 10.5 g/dL — ABNORMAL LOW (ref 13.0–17.0)
Immature Granulocytes: 1 %
Lymphocytes Relative: 16 %
Lymphs Abs: 2 10*3/uL (ref 0.7–4.0)
MCH: 30.4 pg (ref 26.0–34.0)
MCHC: 33 g/dL (ref 30.0–36.0)
MCV: 92.2 fL (ref 80.0–100.0)
Monocytes Absolute: 1.1 10*3/uL — ABNORMAL HIGH (ref 0.1–1.0)
Monocytes Relative: 8 %
Neutro Abs: 9.5 10*3/uL — ABNORMAL HIGH (ref 1.7–7.7)
Neutrophils Relative %: 71 %
Platelets: 215 10*3/uL (ref 150–400)
RBC: 3.45 MIL/uL — ABNORMAL LOW (ref 4.22–5.81)
RDW: 15 % (ref 11.5–15.5)
WBC: 13.1 10*3/uL — ABNORMAL HIGH (ref 4.0–10.5)
nRBC: 0 % (ref 0.0–0.2)

## 2022-03-04 LAB — GLUCOSE, CAPILLARY
Glucose-Capillary: 141 mg/dL — ABNORMAL HIGH (ref 70–99)
Glucose-Capillary: 112 mg/dL — ABNORMAL HIGH (ref 70–99)
Glucose-Capillary: 122 mg/dL — ABNORMAL HIGH (ref 70–99)
Glucose-Capillary: 57 mg/dL — ABNORMAL LOW (ref 70–99)
Glucose-Capillary: 91 mg/dL (ref 70–99)

## 2022-03-04 MED ORDER — LOSARTAN POTASSIUM 50 MG PO TABS
50.0000 mg | ORAL_TABLET | Freq: Every day | ORAL | Status: DC
  Administered 2022-03-05: 50 mg via ORAL
  Filled 2022-03-04: qty 1

## 2022-03-04 MED ORDER — POTASSIUM CHLORIDE CRYS ER 10 MEQ PO TBCR
40.0000 meq | EXTENDED_RELEASE_TABLET | Freq: Once | ORAL | Status: AC
  Administered 2022-03-04: 40 meq via ORAL
  Filled 2022-03-04: qty 4

## 2022-03-04 MED ORDER — LOSARTAN POTASSIUM 50 MG PO TABS
25.0000 mg | ORAL_TABLET | Freq: Once | ORAL | Status: AC
  Administered 2022-03-04: 25 mg via ORAL

## 2022-03-04 NOTE — Progress Notes (Signed)
PROGRESS NOTE    Nathan Kamer [name]  VZD:[id number] DOB: December 17, 1924 DOA: 02/26/2022 PCP: Jacelyn Pi, NP    Chief Complaint  Patient presents with   Shortness of Breath    Brief Narrative:  HPI per Dr. York Grice [name] is a 88 y.o. male with medical history significant of DM2, GERD, HTN, HLD, SSS s/p PPM. Presenting with shortness of breath and cough. He reports that he's had shortness of breath for over a week. He was diagnosed with PNA in the outpatient setting and started on an abx. He is unable to tell me which one he had. This morning, he states his coughing got much worse. He didn't have any fever or sick contacts. When his symptoms did not improve, the staff at his ALF called for EMS. He denies any other aggravating or alleviating factors.    Workup done on admission including CT chest abdomen and pelvis concerning for multifocal pneumonia and sigmoid colitis. -Patient placed empirically on IV antibiotics. -Patient also noted on admission to have low CBGs and placed on a D10 drip. -Patient noted to have elevated troponins, cardiology consulted and following.    Assessment & Plan:  Principal Problem:   Sepsis ([place]) Active Problems:   Colitis   Hypertension   Diabetes ([place])   Sinus node dysfunction ([facility])   Multifocal pneumonia   Elevated troponin   GERD (gastroesophageal reflux disease)    Assessment and Plan: * Sepsis ([place]) -Patient admitted and met criteria for sepsis with on admission with fever, tachycardia, leukocytosis with a white count of 21.2, CT chest concerning for multifocal pneumonia and colitis. -Blood cultures ordered and negative. -MRSA PCR negative.  COVID-19 PCR negative.  Influenza A and B PCR negative. -Urine cultures negative. -GI pathogen panel negative. -Fever curve trending down. -Leukocytosis initially was trending down but trended back up.  -IV vancomycin has been discontinued. -Patient transitioned from IV Flagyl to oral  Flagyl on 03/02/2022. -Discontinued IV cefepime and placed on Omnicef for 3-4 more days to complete a course of antibiotic treatment. -Supportive care.  Colitis -Stool studies for C. difficile negative -GI pathogen panel negative. -Was on IV cefepime and Flagyl and transitioned to oral Flagyl.   -Changed from IV cefepime to oral Omnicef to complete course of antibiotic treatment.   -Contact precautions discontinued.    GERD (gastroesophageal reflux disease) -PPI.  Elevated troponin -Patient noted to have elevated troponins, seen by cardiology and concern for type II MI secondary to pneumonia. -2D echo done with EF of 30 to 35% and currently off BiPAP. -Patient seen in consultation by cardiology recommending addition of aspirin, carvedilol, losartan once comfortable from her respiratory perspective. -Continue Coreg.  -Cozaar resumed per cardiology and Aldactone added to patient's regimen. -Status post 48 hours of IV heparin which has subsequently been discontinued.  -Cardiology following and appreciate input and recommendations.  Multifocal pneumonia -Noted on CT chest. -Patient presented with sepsis and CT chest concerning for multifocal pneumonia. -Patient with improving rhonchorous gurgling sounds on examination. -BNP noted to be elevated. -Urine Legionella antigen pending, urine pneumococcus antigen pending.  -Blood cultures negative. -MRSA PCR negative, IVvancomycin discontinued.   -Continue Mucinex 1200 twice daily, Claritin, Flonase. -Changed IV Flagyl to oral Flagyl. -Discontinued IV cefepime and placed on Omnicef to complete course of antibiotic treatment. -Continue PPI. -Status post Lasix 20 mg IV every 12 hours x 2 doses with good urine output and clinical improvement.  Sinus node dysfunction (HCC) -Status post PPM. -BP initially noted to  be soft on admission however improving. -Coreg resumed.   -Patient placed back on home regimen Cozaar and Aldactone added per  cardiology.  -Cardiology following.  Diabetes ([place]) -Hemoglobin A1c 6.0 (03/17/2021)\ -Repeat hemoglobin A1c at 6.4 (02/27/2022). -Patient noted to have bouts of hypoglycemia and as such was on a D10 drip. -Patient started on a dysphagia 3 diet, CBGs/hypoglycemia improved and as such D10 changed to D5 normal saline.  -D5 normal saline discontinued, CBG noted to elevated to 402 the evening of 03/02/2022. -CBGs improved with CBG of 141 this morning after being started on long-acting Semglee at decreased dose of 15 units daily, SSI.  Hypertension -Blood pressure noted to be soft on admission and in the morning of 02/27/2022, and as such patient ordered a dose of IV albumin. -Blood pressure noted to be elevated in the 170s to 180s on 02/27/2022 mid morning to afternoon.  -Due to concerns for volume overload patient placed on Lasix 20 mg IV every 12 hours x 2 doses with a good urine output.   -BNP noted to be elevated.   -Coreg resumed. -Patient started back on Cozaar and Aldactone per cardiology. -Increase Cozaar to 50 mg daily. -Defer up titration of medications to cardiology.         DVT prophylaxis: Heparin drip>>> Lovenox Code Status: Partial Family Communication: Updated patient.  No family at bedside. Disposition: Likely back to assisted living facility with home health hopefully tomorrow if leukocytosis remained stable or improves.   Status is: Inpatient Remains inpatient appropriate because: Severity of illness   Consultants:  Cardiology: Dr. Marlou Porch 02/26/2022  Procedures:  CT abdomen and pelvis 02/26/2022 CT angiogram chest 02/26/2022  Antimicrobials:  IV cefepime 02/26/2022>> 03/03/2022 IV Flagyl 02/26/2022>>>> oral Flagyl 03/02/2022>>> IV vancomycin 02/26/2022>>> 02/27/2022 Omnicef 03/03/2022>>>> 03/06/2022     Subjective: Sitting up in bed.  Daughter-in-law at bedside.  Overall feeling better.  Cough improved.  Denies any significant shortness of breath.   No chest pain.  Tolerating diet.    Objective: Vitals:   03/03/22 2037 03/04/22 0657 03/04/22 0820 03/04/22 1401  BP: (!) 190/73 (!) 152/74  138/89  Pulse: 64 95  78  Resp: _0 Temp: 97.8 F (36.6 C) 97.8 F (36.6 C)  98.1 F (36.7 C)  TempSrc: Oral Oral  Oral  SpO2: 96% 94% 92% 95%  Weight:      Height:       No intake or output data in the 24 hours ending 03/04/22 2007  Filed Weights   02/28/22 0500 03/02/22 0500 03/03/22 0632  Weight: 60.5 kg 62.2 kg 61.6 kg    Examination:  General exam: NAD. Respiratory system: Diffuse coarse rhonchorous breath sounds improved.  No wheezing noted.  Fair air movement.  Speaking in full sentences.   Cardiovascular system: Regular rate and rhythm no murmurs rubs or gallops.  No JVD.  No lower extremity edema. Gastrointestinal system: Abdomen is soft, nontender, nondistended, positive bowel sounds.  No rebound.  No guarding.  Central nervous system: Alert and oriented. No focal neurological deficits. Extremities: Symmetric 5 x 5 power. Skin: No rashes, lesions or ulcers Psychiatry: Judgement and insight appear normal. Mood & affect appropriate.     Data Reviewed:   CBC: Recent Labs  Lab 02/28/22 0251 03/01/22 0239 03/02/22 0535 03/03/22 0418 03/04/22 0504  WBC 19.3* 13.9* 10.7* 10.3 13.1*  NEUTROABS 15.1* 10.5* 7.9* 7.2 9.5*  HGB 10.7* 9.9* 9.9* 10.9* 10.5*  HCT 32.6* 31.0* 30.4* 33.9* 31.8*  MCV 92.1  93.7 92.1 92.6 92.2  PLT 201 183 186 207 878    Basic Metabolic Panel: Recent Labs  Lab 02/26/22 0904 02/27/22 0020 02/28/22 0251 03/01/22 0239 03/02/22 0535 03/03/22 0418 03/04/22 0504  NA 137   < > 134* 136 140 140 142  K 4.7   < > 3.8 4.0 3.7 3.7 3.4*  CL 105   < > 104 107 110 112* 113*  CO2 23   < > 18* 21* 20* 20* 22  GLUCOSE 228*   < > 189* 154* 127* 245* 144*  BUN 49*   < > 29* 34* 32* 34* 29*  CREATININE 1.04   < > 0.98 1.01 0.92 0.86 0.76  CALCIUM 9.0   < > 7.9* 8.1* 8.4* 8.6* 8.4*  MG 2.1  --   1.8 1.9  --   --   --    < > = values in this interval not displayed.    GFR: Estimated Creatinine Clearance: 49.2 mL/min (by C-G formula based on SCr of 0.76 mg/dL).  Liver Function Tests: Recent Labs  Lab 02/26/22 0904 02/27/22 0020 02/28/22 0251  AST _0 ALT _1 ALKPHOS 63 43 52  BILITOT 0.4 0.7 0.6  PROT 8.1 5.5* 6.1*  ALBUMIN 4.0 2.8* 2.9*    CBG: Recent Labs  Lab 03/03/22 1826 03/03/22 2125 03/04/22 0837 03/04/22 1143 03/04/22 1650  GLUCAP 221* 155* 141* 112* 122*     Recent Results (from the past 240 hour(s))  Blood Culture (routine x 2)     Status: None   Collection Time: 02/26/22  9:09 AM   Specimen: BLOOD  Result Value Ref Range Status   Specimen Description   Final    BLOOD SITE NOT SPECIFIED Performed at [facility] [address], [place], [place] [address]    Special Requests   Final    BOTTLES DRAWN AEROBIC AND ANAEROBIC Blood Culture adequate volume Performed at [facility] [address], [place], [place] [address]    Culture   Final    NO GROWTH 5 DAYS Performed at [facility], [place] [address] [place], [place] [address]    Report Status 03/03/2022 FINAL  Final  Blood Culture (routine x 2)     Status: None   Collection Time: 02/26/22  9:23 AM   Specimen: BLOOD RIGHT ARM  Result Value Ref Range Status   Specimen Description   Final    BLOOD RIGHT ARM Performed at [facility], [place] [address] [place], [place] [address]    Special Requests   Final    BOTTLES DRAWN AEROBIC AND ANAEROBIC Blood Culture results may not be optimal due to an excessive volume of blood received in culture bottles Performed at [facility] [address], [place], [place] [address]    Culture   Final    NO GROWTH 5 DAYS Performed at [facility], [place] [address] [place], [place] [address]    Report Status 03/03/2022 FINAL  Final  Resp Panel by RT-PCR (Flu A&B, Covid)  Anterior Nasal Swab     Status: None   Collection Time: 02/26/22  9:52 AM   Specimen: Anterior Nasal Swab  Result Value Ref Range Status   SARS Coronavirus 2 by RT PCR NEGATIVE NEGATIVE Final    Comment: (NOTE) SARS-CoV-2 target nucleic acids are NOT DETECTED.  The SARS-CoV-2 RNA is generally detectable in upper respiratory specimens during the acute  phase of infection. The lowest concentration of SARS-CoV-2 viral copies this assay can detect is 138 copies/mL. A negative result does not preclude SARS-Cov-2 infection and should not be used as the sole basis for treatment or other patient management decisions. A negative result may occur with  improper specimen collection/handling, submission of specimen other than nasopharyngeal swab, presence of viral mutation(s) within the areas targeted by this assay, and inadequate number of viral copies(<138 copies/mL). A negative result must be combined with clinical observations, patient history, and epidemiological information. The expected result is Negative.  Fact Sheet for Patients:  EntrepreneurPulse.com.au  Fact Sheet for Healthcare Providers:  IncredibleEmployment.be  This test is no t yet approved or cleared by the [place] FDA and  has been authorized for detection and/or diagnosis of SARS-CoV-2 by FDA under an Emergency Use Authorization (EUA). This EUA will remain  in effect (meaning this test can be used) for the duration of the COVID-19 declaration under Section 564(b)(1) of the Act, 21 U.S.C.section 360bbb-3(b)(1), unless the authorization is terminated  or revoked sooner.       Influenza A by PCR NEGATIVE NEGATIVE Final   Influenza B by PCR NEGATIVE NEGATIVE Final    Comment: (NOTE) The Xpert Xpress SARS-CoV-2/FLU/RSV plus assay is intended as an aid in the diagnosis of influenza from Nasopharyngeal swab specimens and should not be used as a sole basis for treatment. Nasal washings  and aspirates are unacceptable for Xpert Xpress SARS-CoV-2/FLU/RSV testing.  Fact Sheet for Patients: EntrepreneurPulse.com.au  Fact Sheet for Healthcare Providers: IncredibleEmployment.be  This test is not yet approved or cleared by the [place] FDA and has been authorized for detection and/or diagnosis of SARS-CoV-2 by FDA under an Emergency Use Authorization (EUA). This EUA will remain in effect (meaning this test can be used) for the duration of the COVID-19 declaration under Section 564(b)(1) of the Act, 21 U.S.C. section 360bbb-3(b)(1), unless the authorization is terminated or revoked.  Performed at [facility], [place] [address], [place], [place] [address]   C Difficile Quick Screen w PCR reflex     Status: None   Collection Time: 02/26/22 10:32 AM   Specimen: STOOL  Result Value Ref Range Status   C Diff antigen NEGATIVE NEGATIVE Final   C Diff toxin NEGATIVE NEGATIVE Final   C Diff interpretation No C. difficile detected.  Final    Comment: Performed at [facility], [place] [address], [place], [place] [address]  MRSA Next Gen by PCR, Nasal     Status: None   Collection Time: 02/26/22  1:32 PM   Specimen: Nasal Mucosa; Nasal Swab  Result Value Ref Range Status   MRSA by PCR Next Gen NOT DETECTED NOT DETECTED Final    Comment: (NOTE) The GeneXpert MRSA Assay (FDA approved for NASAL specimens only), is one component of a comprehensive MRSA colonization surveillance program. It is not intended to diagnose MRSA infection nor to guide or monitor treatment for MRSA infections. Test performance is not FDA approved in patients less than 69 years old. Performed at [facility], [place] [address], [place],  [address]   Gastrointestinal Panel by PCR , Stool     Status: None   Collection Time: 02/27/22 10:31 AM   Specimen: Stool  Result Value Ref Range Status   Campylobacter  species NOT DETECTED NOT DETECTED Final   Plesimonas shigelloides NOT DETECTED NOT DETECTED Final   Salmonella species NOT DETECTED NOT DETECTED Final   Yersinia enterocolitica  NOT DETECTED NOT DETECTED Final   Vibrio species NOT DETECTED NOT DETECTED Final   Vibrio cholerae NOT DETECTED NOT DETECTED Final   Enteroaggregative E coli (EAEC) NOT DETECTED NOT DETECTED Final   Enteropathogenic E coli (EPEC) NOT DETECTED NOT DETECTED Final   Enterotoxigenic E coli (ETEC) NOT DETECTED NOT DETECTED Final   Shiga like toxin producing E coli (STEC) NOT DETECTED NOT DETECTED Final   Shigella/Enteroinvasive E coli (EIEC) NOT DETECTED NOT DETECTED Final   Cryptosporidium NOT DETECTED NOT DETECTED Final   Cyclospora cayetanensis NOT DETECTED NOT DETECTED Final   Entamoeba histolytica NOT DETECTED NOT DETECTED Final   Giardia lamblia NOT DETECTED NOT DETECTED Final   Adenovirus F40/41 NOT DETECTED NOT DETECTED Final   Astrovirus NOT DETECTED NOT DETECTED Final   Norovirus GI/GII NOT DETECTED NOT DETECTED Final   Rotavirus A NOT DETECTED NOT DETECTED Final   Sapovirus (I, II, IV, and V) NOT DETECTED NOT DETECTED Final    Comment: Performed at [facility], [address], [place], [place] [address]  Urine Culture     Status: None   Collection Time: 02/27/22  5:51 PM   Specimen: Urine, Catheterized  Result Value Ref Range Status   Specimen Description   Final    URINE, CATHETERIZED Performed at [facility], [place] [address] [place], [place] [address]    Special Requests   Final    NONE Performed at [facility], [place] [address] [place], [place] [address]    Culture   Final    NO GROWTH Performed at [facility], [place] [address], [place], [place] [address]    Report Status 02/28/2022 FINAL  Final         Radiology Studies: No results found.      Scheduled Meds:  aspirin EC  81 mg Oral Daily   carvedilol  3.125 mg Oral BID WC    cefdinir  300 mg Oral Q12H   Chlorhexidine Gluconate Cloth  6 each Topical Q0600   cholecalciferol  2,000 Units Oral Daily   enoxaparin (LOVENOX) injection  40 mg Subcutaneous Q24H   ferrous sulfate  325 mg Oral Daily   fluticasone  2 spray Each Nare Daily   guaiFENesin  1,200 mg Oral BID   insulin aspart  0-9 Units Subcutaneous TID WC   insulin glargine-yfgn  15 Units Subcutaneous Daily   levalbuterol  0.63 mg Nebulization TID   loratadine  10 mg Oral Daily   [START ON 03/05/2022] losartan  50 mg Oral Daily   metroNIDAZOLE  500 mg Oral Q12H   mouth rinse  15 mL Mouth Rinse 4 times per day   pantoprazole  40 mg Oral Daily   PARoxetine  10 mg Oral Daily   spironolactone  12.5 mg Oral Daily   Continuous Infusions:  albumin human       LOS: 6 days    Time spent: 40 minutes    Irine Seal, MD [facility]   To contact the attending provider between 7A-7P or the covering provider during after hours 7P-7A, please log into the web site [email or web address] and access using universal [place] password for that web site. If you do not have the password, please call the hospital operator.  03/04/2022, 8:07 PM

## 2022-03-04 NOTE — Care Management Important Message (Signed)
Important Message  Patient Details IM Letter given Name: Nathan Koeppen [name] MRN: [id number] Date of Birth: 1924/06/27   Medicare Important Message Given:  Yes     [name] 03/04/2022, 10:45 AM

## 2022-03-05 DIAGNOSIS — R6520 Severe sepsis without septic shock: Secondary | ICD-10-CM

## 2022-03-05 DIAGNOSIS — Z794 Long term (current) use of insulin: Secondary | ICD-10-CM

## 2022-03-05 DIAGNOSIS — J9601 Acute respiratory failure with hypoxia: Secondary | ICD-10-CM

## 2022-03-05 LAB — BASIC METABOLIC PANEL
Anion gap: 8 (ref 5–15)
BUN: 30 mg/dL — ABNORMAL HIGH (ref 8–23)
CO2: 22 mmol/L (ref 22–32)
Calcium: 8.4 mg/dL — ABNORMAL LOW (ref 8.9–10.3)
Chloride: 112 mmol/L — ABNORMAL HIGH (ref 98–111)
Creatinine, Ser: 0.79 mg/dL (ref 0.61–1.24)
GFR, Estimated: >60 mL/min (ref >60)
Glucose, Bld: 87 mg/dL (ref 70–99)
Potassium: 3.7 mmol/L (ref 3.5–5.1)
Sodium: 142 mmol/L (ref 135–145)

## 2022-03-05 LAB — CBC WITH DIFFERENTIAL/PLATELET
Abs Immature Granulocytes: 0.17 10*3/uL — ABNORMAL HIGH (ref 0.00–0.07)
Basophils Absolute: 0.1 10*3/uL (ref 0.0–0.1)
Basophils Relative: 1 %
Eosinophils Absolute: 0.3 10*3/uL (ref 0.0–0.5)
Eosinophils Relative: 2 %
HCT: 34.3 % — ABNORMAL LOW (ref 39.0–52.0)
Hemoglobin: 11.3 g/dL — ABNORMAL LOW (ref 13.0–17.0)
Immature Granulocytes: 1 %
Lymphocytes Relative: 19 %
Lymphs Abs: 2.6 10*3/uL (ref 0.7–4.0)
MCH: 30.1 pg (ref 26.0–34.0)
MCHC: 32.9 g/dL (ref 30.0–36.0)
MCV: 91.5 fL (ref 80.0–100.0)
Monocytes Absolute: 1 10*3/uL (ref 0.1–1.0)
Monocytes Relative: 8 %
Neutro Abs: 9.1 10*3/uL — ABNORMAL HIGH (ref 1.7–7.7)
Neutrophils Relative %: 69 %
Platelets: 246 10*3/uL (ref 150–400)
RBC: 3.75 MIL/uL — ABNORMAL LOW (ref 4.22–5.81)
RDW: 15.1 % (ref 11.5–15.5)
WBC: 13.3 10*3/uL — ABNORMAL HIGH (ref 4.0–10.5)
nRBC: 0 % (ref 0.0–0.2)

## 2022-03-05 LAB — GLUCOSE, CAPILLARY
Glucose-Capillary: 106 mg/dL — ABNORMAL HIGH (ref 70–99)
Glucose-Capillary: 169 mg/dL — ABNORMAL HIGH (ref 70–99)
Glucose-Capillary: 114 mg/dL — ABNORMAL HIGH (ref 70–99)

## 2022-03-05 LAB — MAGNESIUM: Magnesium: 1.9 mg/dL (ref 1.7–2.4)

## 2022-03-05 MED ORDER — CEFDINIR 300 MG PO CAPS: 300.0000 mg | ORAL_CAPSULE | Freq: Two times a day (BID) | ORAL | 0 refills | Status: DC

## 2022-03-05 MED ORDER — HYDRALAZINE HCL 20 MG/ML IJ SOLN
10.0000 mg | Freq: Four times a day (QID) | INTRAMUSCULAR | Status: DC | PRN
  Administered 2022-03-05: 10 mg via INTRAVENOUS
  Filled 2022-03-05: qty 1

## 2022-03-05 MED ORDER — SPIRONOLACTONE 25 MG PO TABS: 12.5000 mg | ORAL_TABLET | Freq: Every day | ORAL | 1 refills | Status: AC

## 2022-03-05 MED ORDER — GUAIFENESIN ER 600 MG PO TB12: 1200.0000 mg | ORAL_TABLET | Freq: Two times a day (BID) | ORAL | 0 refills | Status: AC

## 2022-03-05 MED ORDER — LEVEMIR FLEXTOUCH 100 UNIT/ML ~~LOC~~ SOPN: 15.0000 [IU] | PEN_INJECTOR | Freq: Every day | SUBCUTANEOUS | 11 refills | Status: DC

## 2022-03-05 MED ORDER — FLUTICASONE PROPIONATE 50 MCG/ACT NA SUSP: 2.0000 | Freq: Every day | NASAL | 2 refills | Status: AC

## 2022-03-05 MED ORDER — PANTOPRAZOLE SODIUM 40 MG PO TBEC: 40.0000 mg | DELAYED_RELEASE_TABLET | Freq: Every day | ORAL | 1 refills | Status: AC

## 2022-03-05 MED ORDER — LORATADINE 10 MG PO TABS: 10.0000 mg | ORAL_TABLET | Freq: Every day | ORAL | 1 refills | Status: AC

## 2022-03-05 MED ORDER — LOSARTAN POTASSIUM 50 MG PO TABS: 50.0000 mg | ORAL_TABLET | Freq: Every day | ORAL | 1 refills | Status: AC

## 2022-03-05 MED ORDER — LEVALBUTEROL TARTRATE 45 MCG/ACT IN AERO: 1.0000 | INHALATION_SPRAY | RESPIRATORY_TRACT | 2 refills | Status: AC | PRN

## 2022-03-05 MED ORDER — METRONIDAZOLE 500 MG PO TABS: 500.0000 mg | ORAL_TABLET | Freq: Two times a day (BID) | ORAL | 0 refills | Status: DC

## 2022-03-05 NOTE — Progress Notes (Signed)
Report called and patient has been picked up for transport

## 2022-03-05 NOTE — Discharge Summary (Signed)
Physician Discharge Summary   Patient: Nathan Stencel [name] MRN: [id number] DOB: [birth year]  Admit date:     02/26/2022  Discharge date: 03/05/22  Discharge Physician: Estill Cotta, MD    PCP: Jacelyn Pi, NP   Recommendations at discharge:    Continue Cefdinir 372m BID x 3 more days Continue Flagyl 5062mBID x 3 more day   Discharge Diagnoses:    Sepsis ([facility]  Colitis Multifocal pneumonia   Hypertension   Diabetes ([facility]  Sinus node dysfunction (HCC)   Elevated troponin   GERD (gastroesophageal reflux disease)    Hospital Course:  Nathan Salterr. is a 88.o. male with medical history significant of DM2, GERD, HTN, HLD, SSS s/p PPM. Presenting with shortness of breath and cough. He reports that he's had shortness of breath for over a week. He was diagnosed with PNA in the outpatient setting and started on an abx. He is unable to tell me which one he had. This morning, he states his coughing got much worse. He didn't have any fever or sick contacts. When his symptoms did not improve, the staff at his ALF called for EMS. He denies any other aggravating or alleviating factors.    Workup done on admission including CT chest abdomen and pelvis concerning for multifocal pneumonia and sigmoid colitis.  Assessment and Plan:  [facility] and met criteria for sepsis with on admission with fever, tachycardia, leukocytosis with a white count of 21.2, CT chest concerning for multifocal pneumonia and colitis. -Blood cultures negative -MRSA PCR negative.  COVID-19 PCR negative.  Influenza A and B PCR negative. -Urine cultures negative. -GI pathogen panel negative. -Leukocytosis improving and stable, slowly trending down -Patient was placed on IV cefepime and Flagyl.  Transition to oral Omnicef and oral Flagyl for 3 more days to complete full course.    Acute colitis -Stool studies for C. difficile negative -GI pathogen panel negative. -Was on IV  cefepime and Flagyl and transitioned to oral Flagyl and Omnicef.   -Contact precautions discontinued.     GERD (gastroesophageal reflux disease) -Continue PPI   Elevated troponin -Patient noted to have elevated troponins, seen by cardiology and concern for type II MI secondary to pneumonia and sepsis. -2D echo done with EF of 30 to 35% and currently off BiPAP. -Patient seen in consultation by cardiology recommending addition of aspirin, carvedilol, losartan once comfortable from her respiratory perspective. -Continue Coreg.  -Cozaar resumed per cardiology and Aldactone added to patient's regimen. -Status post 48 hours of IV heparin which has subsequently been discontinued.  -Cardiology following and appreciate input and recommendations.   Multifocal pneumonia, Streptococcus pneumonia -Noted on CT chest. -Patient presented with sepsis and CT chest concerning for multifocal pneumonia. -BNP noted to be elevated. -Urine strep antigen positive, urine Legionella negative -MRSA PCR negative, IV vancomycin discontinued.   -Patient was placed on IV cefepime and Flagyl, transitioned to oral Omnicef and oral Flagyl for 3 more days to complete full course -Continue PPI. -Status post Lasix 20 mg IV every 12 hours x 2 doses with good urine output and clinical improvement.   Sinus node dysfunction (HCC) -Status post PPM. -BP initially noted to be soft on admission however improving, Coreg resumed. -Patient placed back on home regimen Cozaar and Aldactone added per cardiology.    Diabetes mellitus type 2, IDDM -hemoglobin A1c at 6.4 (02/27/2022). -Patient noted to have bouts of hypoglycemia and as such was on a D10 drip. Patient started  on a dysphagia 3 diet -Decrease Semglee to 15 units daily   Hypertension -Initially noted to be soft due to sepsis, patient received IV albumin. -Now improved, patient started back on Coreg, Cozaar, Aldactone added  Patient seen by physical therapy, recommended  home health PT at ALF       Pain control - [place] was reviewed. and patient was instructed, not to drive, operate heavy machinery, perform activities at heights, swimming or participation in water activities or provide baby-sitting services while on Pain, Sleep and Anxiety Medications; until their outpatient Physician has advised to do so again. Also recommended to not to take more than prescribed Pain, Sleep and Anxiety Medications.  Consultants: Cardiology Procedures performed: None Disposition: Assisted living Diet recommendation:  Discharge Diet Orders (From admission, onward)     Diet: Dysphagia 3 diet with thin liquids   DISCHARGE MEDICATION: Allergies as of 03/05/2022       Reactions   Penicillins Other (See Comments)   Reaction is not listed on MAR        Medication List     STOP taking these medications    calcium carbonate 1250 (500 Ca) MG tablet Commonly known as: OS-CAL - dosed in mg of elemental calcium   doxycycline 100 MG capsule Commonly known as: VIBRAMYCIN   Gerhardt's butt cream Crea   Januvia 50 MG tablet Generic drug: sitaGLIPtin   metFORMIN 500 MG tablet Commonly known as: GLUCOPHAGE       TAKE these medications    acetaminophen 325 MG tablet Commonly known as: TYLENOL Take 2 tablets (650 mg total) by mouth every 6 (six) hours as needed for mild pain (or Fever >/= 101). What changed: reasons to take this   aspirin EC 81 MG tablet Take 1 tablet (81 mg total) by mouth daily with breakfast. Swallow whole.   BAZA PROTECT EX Apply 1 application  topically See admin instructions. Apply to affected areas of buttocks, sacrum, and genitals twice daily. May also apply as needed for each incontinence episode   Calcium Carbonate 500 MG Chew Chew 500 mg by mouth in the morning and at bedtime.   carvedilol 3.125 MG tablet Commonly known as: COREG Take 3.125 mg by mouth 2 (two) times  daily with a meal. What changed: Another medication with the same name was removed. Continue taking this medication, and follow the directions you see here.   cefdinir 300 MG capsule Commonly known as: OMNICEF Take 1 capsule (300 mg total) by mouth 2 (two) times daily for 3 days.   cyanocobalamin 1000 MCG tablet Commonly known as: VITAMIN B12 Take 2,000 mcg by mouth daily.   DUODERM CGF DRESSING EX Apply 1 Application topically once a week. Apply to sacrum wound. May also change as needed when dressing is soiled.   FeroSul 325 (65 FE) MG tablet Generic drug: ferrous sulfate Take 325 mg by mouth daily.   fluticasone 50 MCG/ACT nasal spray Commonly known as: FLONASE Place 2 sprays into both nostrils daily. Start taking on: March 06, 2022   guaiFENesin 600 MG 12 hr tablet Commonly known as: MUCINEX Take 2 tablets (1,200 mg total) by mouth 2 (two) times daily.   HumaLOG KwikPen 100 UNIT/ML KwikPen Generic drug: insulin lispro Inject 5 Units into the skin 2 (two) times daily as needed (for BS >450).   levalbuterol 45 MCG/ACT inhaler Commonly known as: XOPENEX HFA Inhale 1 puff into the lungs every 4 (four) hours as needed for wheezing.  Levemir FlexTouch 100 UNIT/ML FlexPen Generic drug: insulin detemir Inject 15 Units into the skin daily. What changed:  how much to take when to take this   loratadine 10 MG tablet Commonly known as: CLARITIN Take 1 tablet (10 mg total) by mouth daily. Start taking on: March 06, 2022   losartan 50 MG tablet Commonly known as: COZAAR Take 1 tablet (50 mg total) by mouth daily. What changed:  medication strength how much to take   metroNIDAZOLE 500 MG tablet Commonly known as: FLAGYL Take 1 tablet (500 mg total) by mouth 2 (two) times daily for 3 days.   nystatin ointment Commonly known as: MYCOSTATIN Apply 1 Application topically 2 (two) times daily. To groin area   pantoprazole 40 MG tablet Commonly known as:  PROTONIX Take 1 tablet (40 mg total) by mouth daily. Start taking on: March 06, 2022   PARoxetine 10 MG tablet Commonly known as: PAXIL Take 10 mg by mouth daily.   spironolactone 25 MG tablet Commonly known as: ALDACTONE Take 0.5 tablets (12.5 mg total) by mouth daily. Start taking on: March 06, 2022   UNABLE TO FIND Take 1 Tube by mouth as needed (BS less than 60 or signs of hypoglycemia). Med Name: glucose gel   Vitamin D3 50 MCG (2000 UT) Tabs Take 2,000 Units by mouth daily.        Follow-up Information     Alfonse Spruce, [place] Valere Dross, NP. Schedule an appointment as soon as possible for a visit in 2 week(s).   Specialty: Nurse Practitioner Why: for hospital follow-up Contact information: [address] [place] [address] [phone]                Discharge Exam: Danley Danker Weights   03/02/22 0500 03/03/22 4935 03/05/22 0601  Weight: 62.2 kg 61.6 kg 61.9 kg   S: No acute complaints, no fevers or chills, no cough, feeling better, close to baseline   Vitals:   03/05/22 0733 03/05/22 0825 03/05/22 0828 03/05/22 1223  BP: (!) 165/94   123/68  Pulse:    94  Resp:    18  Temp:    97.6 F (36.4 C)  TempSrc:      SpO2:  94% 94% 97%  Weight:      Height:         Physical Exam General: Alert and oriented x 3, NAD Cardiovascular: S1 S2 clear, RRR.  Respiratory: CTAB, no wheezing Gastrointestinal: Soft, nontender, nondistended, NBS Ext: no pedal edema bilaterally Neuro: no new deficits Psych: Normal affect and demeanor, alert and oriented x3   Condition at discharge: fair  The results of significant diagnostics from this hospitalization (including imaging, microbiology, ancillary and laboratory) are listed below for reference.   Imaging Studies: DG CHEST PORT 1 VIEW  Result Date: 02/27/2022 CLINICAL DATA:  SOB EXAM: PORTABLE CHEST 1 VIEW COMPARISON:  02/26/2022 FINDINGS: Left-sided pacemaker. Cardiac silhouette may be Enlarged. Dependent  subsegmental atelectasis with linear densities or scarring. Unremarkable pulmonary vasculature. No pneumothorax. Calcified aorta. IMPRESSION: Subsegmental atelectasis or scarring. Prominent cardiac silhouette. Electronically Signed   By: Sammie Bench M.D.   On: 02/27/2022 12:48   [place] EKG SITE RITE  Result Date: 02/27/2022 If Site Rite image not attached, placement could not be confirmed due to current cardiac rhythm.  ECHOCARDIOGRAM COMPLETE  Result Date: 02/26/2022    ECHOCARDIOGRAM REPORT   Patient Name:   Nathan Bolds [name] Date of Exam: 02/26/2022 Medical Rec #:  [id number]  Height:       67.0 in Accession #:    [id number]         Weight:       127.9 lb Date of Birth:  01-Oct-1924         BSA:          1.672 m Patient Age:    57 years           BP:           105/66 mmHg Patient Gender: M                  HR:           80 bpm. Exam Location:  Inpatient Procedure: 2D Echo and Intracardiac Opacification Agent Indications:    elevated troponin  History:        Patient has prior history of Echocardiogram examinations, most                 recent 03/20/2021. Pacemaker; Risk Factors:Diabetes and                 Hypertension.  Sonographer:    Harvie Junior Referring Phys: [id number] Jonnie Finner  Sonographer Comments: Technically difficult study due to poor echo windows, echo performed with patient supine and on artificial respirator and no subcostal window. IMPRESSIONS  1. Left ventricular ejection fraction, by estimation, is 30 to 35%. The left ventricle has moderately decreased function. The left ventricle demonstrates global hypokinesis. There is severe asymmetric left ventricular hypertrophy of the basal-septal segment. Left ventricular diastolic parameters are consistent with Grade I diastolic dysfunction (impaired relaxation).  2. Right ventricular systolic function is normal. The right ventricular size is mildly enlarged. There is mildly elevated pulmonary artery systolic pressure.  3. The  mitral valve is grossly normal. No evidence of mitral valve regurgitation. No evidence of mitral stenosis.  4. The aortic valve was not well visualized. Aortic valve regurgitation is not visualized. Comparison(s): Prior images reviewed side by side. Similar LVEF- this study is more technically difficult that prior. FINDINGS  Left Ventricle: Left ventricular ejection fraction, by estimation, is 30 to 35%. The left ventricle has moderately decreased function. The left ventricle demonstrates global hypokinesis. Definity contrast agent was given IV to delineate the left ventricular endocardial borders. The left ventricular internal cavity size was normal in size. There is severe asymmetric left ventricular hypertrophy of the basal-septal segment. Left ventricular diastolic parameters are consistent with Grade I diastolic dysfunction (impaired relaxation). Right Ventricle: The right ventricular size is mildly enlarged. No increase in right ventricular wall thickness. Right ventricular systolic function is normal. There is mildly elevated pulmonary artery systolic pressure. The tricuspid regurgitant velocity is 2.92 m/s, and with an assumed right atrial pressure of 3 mmHg, the estimated right ventricular systolic pressure is 15.9 mmHg. Left Atrium: Left atrial size was normal in size. Right Atrium: Right atrial size was normal in size. Pericardium: There is no evidence of pericardial effusion. Presence of epicardial fat layer. Mitral Valve: The mitral valve is grossly normal. No evidence of mitral valve regurgitation. No evidence of mitral valve stenosis. Tricuspid Valve: The tricuspid valve is normal in structure. Tricuspid valve regurgitation is not demonstrated. No evidence of tricuspid stenosis. Aortic Valve: The aortic valve was not well visualized. Aortic valve regurgitation is not visualized. Aortic regurgitation PHT measures 767 msec. Aortic valve mean gradient measures 5.0 mmHg. Aortic valve peak gradient  measures 8.6 mmHg. Pulmonic Valve: The pulmonic valve was not well  visualized. Pulmonic valve regurgitation is not visualized. No evidence of pulmonic stenosis. Aorta: The aortic root is normal in size and structure. IAS/Shunts: No atrial level shunt detected by color flow Doppler.  LEFT VENTRICLE PLAX 2D LVIDd:         4.40 cm      Diastology LVIDs:         3.50 cm      LV e' medial:    4.79 cm/s LV PW:         1.00 cm      LV E/e' medial:  7.2 LV IVS:        1.00 cm      LV e' lateral:   6.64 cm/s                             LV E/e' lateral: 5.2  LV Volumes (MOD) LV vol d, MOD A2C: 102.0 ml LV vol d, MOD A4C: 81.1 ml LV vol s, MOD A2C: 61.2 ml LV vol s, MOD A4C: 60.6 ml LV SV MOD A2C:     40.8 ml LV SV MOD A4C:     81.1 ml LV SV MOD BP:      30.5 ml RIGHT VENTRICLE RV Basal diam:  4.40 cm RV Mid diam:    3.90 cm TAPSE (M-mode): 2.0 cm LEFT ATRIUM             Index        RIGHT ATRIUM           Index LA diam:        3.70 cm 2.21 cm/m   RA Area:     15.40 cm LA Vol (A2C):   47.8 ml 28.59 ml/m  RA Volume:   41.00 ml  24.52 ml/m LA Vol (A4C):   31.5 ml 18.84 ml/m LA Biplane Vol: 37.4 ml 22.37 ml/m  AORTIC VALVE                    PULMONIC VALVE AV Vmax:           147.00 cm/s  PV Vmax:       0.77 m/s AV Vmean:          101.000 cm/s PV Peak grad:  2.4 mmHg AV VTI:            0.229 m AV Peak Grad:      8.6 mmHg AV Mean Grad:      5.0 mmHg LVOT Vmax:         88.80 cm/s LVOT Vmean:        63.200 cm/s LVOT VTI:          0.159 m LVOT/AV VTI ratio: 0.69 AI PHT:            767 msec MITRAL VALVE               TRICUSPID VALVE MV Area (PHT): 2.34 cm    TR Peak grad:   34.1 mmHg MV Decel Time: 324 msec    TR Vmax:        292.00 cm/s MR Peak grad: 50.3 mmHg MR Vmax:      354.67 cm/s  SHUNTS MV E velocity: 34.70 cm/s  Systemic VTI: 0.16 m MV A velocity: 75.80 cm/s MV E/A ratio:  0.46 Rudean Haskell MD Electronically signed by Rudean Haskell MD Signature Date/Time: 02/26/2022/4:27:14 PM    Final    CT Angio Chest  PE W and/or Wo Contrast  Result Date: 02/26/2022 CLINICAL DATA:  Sepsis. Shortness of breath. Recent antibiotic therapy for pneumonia. Hypoxia. EXAM: CT ANGIOGRAPHY CHEST CT ABDOMEN AND PELVIS WITH CONTRAST TECHNIQUE: Multidetector CT imaging of the chest was performed using the standard protocol during bolus administration of intravenous contrast. Multiplanar CT image reconstructions and MIPs were obtained to evaluate the vascular anatomy. Multidetector CT imaging of the abdomen and pelvis was performed using the standard protocol during bolus administration of intravenous contrast. RADIATION DOSE REDUCTION: This exam was performed according to the departmental dose-optimization program which includes automated exposure control, adjustment of the mA and/or kV according to patient size and/or use of iterative reconstruction technique. CONTRAST:  99m OMNIPAQUE IOHEXOL 350 MG/ML SOLN COMPARISON:  Chest radiograph 02/26/2022 and CT abdomen 12/14/2020 FINDINGS: Despite efforts by the technologist and patient, motion artifact is present on today's exam and could not be eliminated. This reduces exam sensitivity and specificity. CTA CHEST FINDINGS Cardiovascular: No filling defect is identified in the pulmonary arterial tree to suggest pulmonary embolus. Reduced sensitivity due to motion artifact. Coronary, aortic arch, and branch vessel atherosclerotic vascular disease. Mild cardiomegaly. Dual lead pacer noted. Mediastinum/Nodes: 1.7 cm hypodense nodule in the right thyroid lobe on image 17 series 2. In the setting of significant comorbidities or limited life expectancy, no follow-up recommended (ref: J Am Coll Radiol. 2015 Feb;12(2): 143-50).No pathologic adenopathy. Lungs/Pleura: Airspace opacities in both lower lobes suspicious for multilobar pneumonia or aspiration pneumonitis. There is some faint airspace opacity peripherally in the right upper lobe on image 47 series 4. Musculoskeletal: Degenerative  sternoclavicular arthropathy with some posterior subluxation of the right clavicle along the sternoclavicular joint as shown on image 37 series 4. Thoracic spondylosis. Review of the MIP images confirms the above findings. CT ABDOMEN and PELVIS FINDINGS Hepatobiliary: Unremarkable Pancreas: Atrophic pancreas with small scattered cystic lesions or cystic dilatation of the dorsal pancreatic duct. The largest such lesion is in the pancreatic head and measures 1.2 by 1.2 cm on image 34 series 4, stable based on my measurements compared to 12/14/2020. No progression or change since the prior exam. Spleen: Unremarkable Adrenals/Urinary Tract: Both adrenal glands appear normal. Bosniak category 1 cyst of the left kidney upper pole is benign and warrants no further workup. Urinary bladder and left distal ureter partially obscured by streak artifact from the left hip implant. Stomach/Bowel: Distended rectum and sigmoid colon, with mild associated wall thickening potentially reflecting distal colitis. The degree of wall thickening is less striking than on the 12/14/2020 exam although the distal colon is substantially more distended with frothy material down on that exam. Along the proximal margin of the dilated segment the proximal sigmoid colon demonstrates some twisting/swirling as on images 33 through 42 of series 6, along the margin of a diverticulum, but without luminal occlusion/obstruction or other significant indicator of a fulcrum for volvulus. Accordingly I favor a functional abnormality and/or distal colitis over sigmoid volvulus. 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 There is also mild wall calcification in the proximal SMA. No pathologic adenopathy. Reproductive: Prostate gland partially obscured by streak artifact from the patient's left hip implant. Other: No substantial ascites.  Trace perirectal stranding. Musculoskeletal: Left hip hemiarthroplasty.  Interdigitating articulation and likely partial fusion at L4-5 and likely partial fusion at L5-S1. Left foraminal impingement at L4-5 due to intervertebral and facet spurring. Probably fused facet joints at L4-5. Mild left foraminal stenosis at L3-4 due to facet arthropathy. Review of the MIP images confirms  the above findings. IMPRESSION: 1. No filling defect is identified in the pulmonary arterial tree to suggest pulmonary embolus. Reduced sensitivity due to motion artifact. 2. Airspace opacities in both lower lobes suspicious for multilobar pneumonia or aspiration pneumonitis. There is also some faint airspace opacity peripherally in the right upper lobe. 3. Distended rectum and sigmoid colon, with mild associated wall thickening potentially reflecting distal colitis. The degree of wall thickening is less striking than on the 12/14/2020 exam. There is some twisting/swirling along the proximal margin of the dilated segment of the proximal sigmoid colon, but without luminal occlusion/obstruction or other significant indicator of a fulcrum for volvulus. Accordingly I favor a functional abnormality and/or distal colitis over sigmoid volvulus. 4. Atrophic pancreas with small scattered cystic lesions or cystic dilatation of the dorsal pancreatic duct. The largest such lesion is in the pancreatic head and measures 1.2 by 1.2 cm. No progression or change since the prior exam. Given the patient's age, follow up pancreatic protocol CT or MRI is recommended in 2 years time. 5. 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 Left foraminal impingement at L4-5 due to intervertebral and facet spurring. 6. Aortic and coronary atherosclerosis. Aortic Atherosclerosis (ICD10-I70.0). Electronically Signed   By: Van Clines M.D.   On: 02/26/2022 11:38   CT ABDOMEN PELVIS W CONTRAST  Result Date: 02/26/2022 CLINICAL DATA:  Sepsis. Shortness of breath. Recent antibiotic therapy for pneumonia.  Hypoxia. EXAM: CT ANGIOGRAPHY CHEST CT ABDOMEN AND PELVIS WITH CONTRAST TECHNIQUE: Multidetector CT imaging of the chest was performed using the standard protocol during bolus administration of intravenous contrast. Multiplanar CT image reconstructions and MIPs were obtained to evaluate the vascular anatomy. Multidetector CT imaging of the abdomen and pelvis was performed using the standard protocol during bolus administration of intravenous contrast. RADIATION DOSE REDUCTION: This exam was performed according to the departmental dose-optimization program which includes automated exposure control, adjustment of the mA and/or kV according to patient size and/or use of iterative reconstruction technique. CONTRAST:  69m OMNIPAQUE IOHEXOL 350 MG/ML SOLN COMPARISON:  Chest radiograph 02/26/2022 and CT abdomen 12/14/2020 FINDINGS: Despite efforts by the technologist and patient, motion artifact is present on today's exam and could not be eliminated. This reduces exam sensitivity and specificity. CTA CHEST FINDINGS Cardiovascular: No filling defect is identified in the pulmonary arterial tree to suggest pulmonary embolus. Reduced sensitivity due to motion artifact. Coronary, aortic arch, and branch vessel atherosclerotic vascular disease. Mild cardiomegaly. Dual lead pacer noted. Mediastinum/Nodes: 1.7 cm hypodense nodule in the right thyroid lobe on image 17 series 2. In the setting of significant comorbidities or limited life expectancy, no follow-up recommended (ref: J Am Coll Radiol. 2015 Feb;12(2): 143-50).No pathologic adenopathy. Lungs/Pleura: Airspace opacities in both lower lobes suspicious for multilobar pneumonia or aspiration pneumonitis. There is some faint airspace opacity peripherally in the right upper lobe on image 47 series 4. Musculoskeletal: Degenerative sternoclavicular arthropathy with some posterior subluxation of the right clavicle along the sternoclavicular joint as shown on image 37 series 4.  Thoracic spondylosis. Review of the MIP images confirms the above findings. CT ABDOMEN and PELVIS FINDINGS Hepatobiliary: Unremarkable Pancreas: Atrophic pancreas with small scattered cystic lesions or cystic dilatation of the dorsal pancreatic duct. The largest such lesion is in the pancreatic head and measures 1.2 by 1.2 cm on image 34 series 4, stable based on my measurements compared to 12/14/2020. No progression or change since the prior exam. Spleen: Unremarkable Adrenals/Urinary Tract: Both adrenal glands appear normal. Bosniak category 1 cyst  of the left kidney upper pole is benign and warrants no further workup. Urinary bladder and left distal ureter partially obscured by streak artifact from the left hip implant. Stomach/Bowel: Distended rectum and sigmoid colon, with mild associated wall thickening potentially reflecting distal colitis. The degree of wall thickening is less striking than on the 12/14/2020 exam although the distal colon is substantially more distended with frothy material down on that exam. Along the proximal margin of the dilated segment the proximal sigmoid colon demonstrates some twisting/swirling as on images 33 through 42 of series 6, along the margin of a diverticulum, but without luminal occlusion/obstruction or other significant indicator of a fulcrum for volvulus. Accordingly I favor a functional abnormality and/or distal colitis over sigmoid volvulus. 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 There is also mild wall calcification in the proximal SMA. No pathologic adenopathy. Reproductive: Prostate gland partially obscured by streak artifact from the patient's left hip implant. Other: No substantial ascites.  Trace perirectal stranding. Musculoskeletal: Left hip hemiarthroplasty. Interdigitating articulation and likely partial fusion at L4-5 and likely partial fusion at L5-S1. Left foraminal impingement at L4-5 due to  intervertebral and facet spurring. Probably fused facet joints at L4-5. Mild left foraminal stenosis at L3-4 due to facet arthropathy. Review of the MIP images confirms the above findings. IMPRESSION: 1. No filling defect is identified in the pulmonary arterial tree to suggest pulmonary embolus. Reduced sensitivity due to motion artifact. 2. Airspace opacities in both lower lobes suspicious for multilobar pneumonia or aspiration pneumonitis. There is also some faint airspace opacity peripherally in the right upper lobe. 3. Distended rectum and sigmoid colon, with mild associated wall thickening potentially reflecting distal colitis. The degree of wall thickening is less striking than on the 12/14/2020 exam. There is some twisting/swirling along the proximal margin of the dilated segment of the proximal sigmoid colon, but without luminal occlusion/obstruction or other significant indicator of a fulcrum for volvulus. Accordingly I favor a functional abnormality and/or distal colitis over sigmoid volvulus. 4. Atrophic pancreas with small scattered cystic lesions or cystic dilatation of the dorsal pancreatic duct. The largest such lesion is in the pancreatic head and measures 1.2 by 1.2 cm. No progression or change since the prior exam. Given the patient's age, follow up pancreatic protocol CT or MRI is recommended in 2 years time. 5. 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 Left foraminal impingement at L4-5 due to intervertebral and facet spurring. 6. Aortic and coronary atherosclerosis. Aortic Atherosclerosis (ICD10-I70.0). Electronically Signed   By: Van Clines M.D.   On: 02/26/2022 11:38   DG Chest Port 1 View  Result Date: 02/26/2022 CLINICAL DATA:  Evaluate sepsis EXAM: PORTABLE CHEST 1 VIEW COMPARISON:  None Available. FINDINGS: LEFT-sided pacer overlies normal cardiac silhouette. No effusion, infiltrate or pneumothorax. No acute osseous abnormality. IMPRESSION: No  evidence of pulmonary infection. Electronically Signed   By: Suzy Bouchard M.D.   On: 02/26/2022 09:36    Microbiology: Results for orders placed or performed during the hospital encounter of 02/26/22  Blood Culture (routine x 2)     Status: None   Collection Time: 02/26/22  9:09 AM   Specimen: BLOOD  Result Value Ref Range Status   Specimen Description   Final    BLOOD SITE NOT SPECIFIED Performed at [facility] [address], [place], [place] [address]    Special Requests   Final    BOTTLES DRAWN AEROBIC AND ANAEROBIC Blood Culture adequate volume  Performed at [facility], [place] [address] [place], [place] [address]    Culture   Final    NO GROWTH 5 DAYS Performed at [facility], [place] [address], [place], [place] [address]    Report Status 03/03/2022 FINAL  Final  Blood Culture (routine x 2)     Status: None   Collection Time: 02/26/22  9:23 AM   Specimen: BLOOD RIGHT ARM  Result Value Ref Range Status   Specimen Description   Final    BLOOD RIGHT ARM Performed at [facility], [place] [address] [place], [place] [address]    Special Requests   Final    BOTTLES DRAWN AEROBIC AND ANAEROBIC Blood Culture results may not be optimal due to an excessive volume of blood received in culture bottles Performed at [facility] [address], [place], [place] [address]    Culture   Final    NO GROWTH 5 DAYS Performed at [facility], [place] [address], [place], [place] [address]    Report Status 03/03/2022 FINAL  Final  Resp Panel by RT-PCR (Flu A&B, Covid) Anterior Nasal Swab     Status: None   Collection Time: 02/26/22  9:52 AM   Specimen: Anterior Nasal Swab  Result Value Ref Range Status   SARS Coronavirus 2 by RT PCR NEGATIVE NEGATIVE Final    Comment: (NOTE) SARS-CoV-2 target nucleic acids are NOT DETECTED.  The SARS-CoV-2 RNA is generally detectable in upper respiratory specimens during the acute  phase of infection. The lowest concentration of SARS-CoV-2 viral copies this assay can detect is 138 copies/mL. A negative result does not preclude SARS-Cov-2 infection and should not be used as the sole basis for treatment or other patient management decisions. A negative result may occur with  improper specimen collection/handling, submission of specimen other than nasopharyngeal swab, presence of viral mutation(s) within the areas targeted by this assay, and inadequate number of viral copies(<138 copies/mL). A negative result must be combined with clinical observations, patient history, and epidemiological information. The expected result is Negative.  Fact Sheet for Patients:  EntrepreneurPulse.com.au  Fact Sheet for Healthcare Providers:  IncredibleEmployment.be  This test is no t yet approved or cleared by the [place] FDA and  has been authorized for detection and/or diagnosis of SARS-CoV-2 by FDA under an Emergency Use Authorization (EUA). This EUA will remain  in effect (meaning this test can be used) for the duration of the COVID-19 declaration under Section 564(b)(1) of the Act, 21 U.S.C.section 360bbb-3(b)(1), unless the authorization is terminated  or revoked sooner.       Influenza A by PCR NEGATIVE NEGATIVE Final   Influenza B by PCR NEGATIVE NEGATIVE Final    Comment: (NOTE) The Xpert Xpress SARS-CoV-2/FLU/RSV plus assay is intended as an aid in the diagnosis of influenza from Nasopharyngeal swab specimens and should not be used as a sole basis for treatment. Nasal washings and aspirates are unacceptable for Xpert Xpress SARS-CoV-2/FLU/RSV testing.  Fact Sheet for Patients: EntrepreneurPulse.com.au  Fact Sheet for Healthcare Providers: IncredibleEmployment.be  This test is not yet approved or cleared by the [place] FDA and has been authorized for detection and/or diagnosis of  SARS-CoV-2 by FDA under an Emergency Use Authorization (EUA). This EUA will remain in effect (meaning this test can be used) for the duration of the COVID-19 declaration under Section 564(b)(1) of the Act, 21 U.S.C. section 360bbb-3(b)(1), unless the authorization is terminated or revoked.  Performed  at [facility], [place] [address] [place], [place] [address]   C Difficile Quick Screen w PCR reflex     Status: None   Collection Time: 02/26/22 10:32 AM   Specimen: STOOL  Result Value Ref Range Status   C Diff antigen NEGATIVE NEGATIVE Final   C Diff toxin NEGATIVE NEGATIVE Final   C Diff interpretation No C. difficile detected.  Final    Comment: Performed at [facility], [place] [address] [place], [place] [address]  MRSA Next Gen by PCR, Nasal     Status: None   Collection Time: 02/26/22  1:32 PM   Specimen: Nasal Mucosa; Nasal Swab  Result Value Ref Range Status   MRSA by PCR Next Gen NOT DETECTED NOT DETECTED Final    Comment: (NOTE) The GeneXpert MRSA Assay (FDA approved for NASAL specimens only), is one component of a comprehensive MRSA colonization surveillance program. It is not intended to diagnose MRSA infection nor to guide or monitor treatment for MRSA infections. Test performance is not FDA approved in patients less than 28 years old. Performed at [facility], [place] [address] [place], [place] [address]   Gastrointestinal Panel by PCR , Stool     Status: None   Collection Time: 02/27/22 10:31 AM   Specimen: Stool  Result Value Ref Range Status   Campylobacter species NOT DETECTED NOT DETECTED Final   Plesimonas shigelloides NOT DETECTED NOT DETECTED Final   Salmonella species NOT DETECTED NOT DETECTED Final   Yersinia enterocolitica NOT DETECTED NOT DETECTED Final   Vibrio species NOT DETECTED NOT DETECTED Final   Vibrio cholerae NOT DETECTED NOT DETECTED Final   Enteroaggregative E coli (EAEC) NOT DETECTED  NOT DETECTED Final   Enteropathogenic E coli (EPEC) NOT DETECTED NOT DETECTED Final   Enterotoxigenic E coli (ETEC) NOT DETECTED NOT DETECTED Final   Shiga like toxin producing E coli (STEC) NOT DETECTED NOT DETECTED Final   Shigella/Enteroinvasive E coli (EIEC) NOT DETECTED NOT DETECTED Final   Cryptosporidium NOT DETECTED NOT DETECTED Final   Cyclospora cayetanensis NOT DETECTED NOT DETECTED Final   Entamoeba histolytica NOT DETECTED NOT DETECTED Final   Giardia lamblia NOT DETECTED NOT DETECTED Final   Adenovirus F40/41 NOT DETECTED NOT DETECTED Final   Astrovirus NOT DETECTED NOT DETECTED Final   Norovirus GI/GII NOT DETECTED NOT DETECTED Final   Rotavirus A NOT DETECTED NOT DETECTED Final   Sapovirus (I, II, IV, and V) NOT DETECTED NOT DETECTED Final    Comment: Performed at [facility], [address] [place], [place] [address]  Urine Culture     Status: None   Collection Time: 02/27/22  5:51 PM   Specimen: Urine, Catheterized  Result Value Ref Range Status   Specimen Description   Final    URINE, CATHETERIZED Performed at [facility], [place] [address], [place], [place] [address]    Special Requests   Final    NONE Performed at [facility], [place] [address], [place], [place] [address]    Culture   Final    NO GROWTH Performed at [facility], [place] [address], [place], [place] [address]    Report Status 02/28/2022 FINAL  Final    Labs: CBC: Recent Labs  Lab 03/01/22 0239 03/02/22 0535 03/03/22 0418 03/04/22 0504 03/05/22 0550  WBC 13.9* 10.7* 10.3 13.1* 13.3*  NEUTROABS 10.5* 7.9* 7.2 9.5* 9.1*  HGB 9.9* 9.9* 10.9* 10.5* 11.3*  HCT 31.0*  30.4* 33.9* 31.8* 34.3*  MCV 93.7 92.1 92.6 92.2 91.5  PLT 183 186 207 215 482   Basic Metabolic Panel: Recent Labs  Lab 02/28/22 0251 03/01/22 0239 03/02/22 0535 03/03/22 0418 03/04/22 0504 03/05/22 0550  NA 134* 136 140 140 142 142  K 3.8 4.0 3.7 3.7 3.4* 3.7  CL  104 107 110 112* 113* 112*  CO2 18* 21* 20* 20* 22 22  GLUCOSE 189* 154* 127* 245* 144* 87  BUN 29* 34* 32* 34* 29* 30*  CREATININE 0.98 1.01 0.92 0.86 0.76 0.79  CALCIUM 7.9* 8.1* 8.4* 8.6* 8.4* 8.4*  MG 1.8 1.9  --   --   --  1.9   Liver Function Tests: Recent Labs  Lab 02/27/22 0020 02/28/22 0251  AST 22 31  ALT 9 11  ALKPHOS 43 52  BILITOT 0.7 0.6  PROT 5.5* 6.1*  ALBUMIN 2.8* 2.9*   CBG: Recent Labs  Lab 03/04/22 1143 03/04/22 1650 03/04/22 2118 03/04/22 2144 03/05/22 0753  GLUCAP 112* 122* 57* 91 106*    Discharge time spent: greater than 30 minutes.  Signed: Estill Cotta, MD [facility] 03/05/2022

## 2022-03-05 NOTE — Progress Notes (Signed)
High BP noted. MD notified, awaiting order. Pt has no PRN's to cover hbp.     03/05/22 0601  Vitals  Temp 97.8 F (36.6 C)  BP (!) 175/85  MAP (mmHg) 108  BP Location Left Arm  BP Method Automatic  Patient Position (if appropriate) Lying  Pulse Rate 68  Resp (!) 22  MEWS COLOR  MEWS Score Color Green  Oxygen Therapy  SpO2 96 %  Height and Weight  Weight 61.9 kg  BMI (Calculated) 21.37  MEWS Score  MEWS Temp 0  MEWS Systolic 0  MEWS Pulse 0  MEWS RR 1  MEWS LOC 0  MEWS Score 1

## 2022-03-05 NOTE — Progress Notes (Signed)
Recycled monitor for a second reading , HR 87, Map115. Will notify Am nurse of improvement to monitor  03/05/22 0733  Vitals  BP (!) 165/94  BP Location Right Arm

## 2022-03-05 NOTE — Progress Notes (Signed)
Mobility Specialist - Progress Note   03/05/22 1033  Mobility  Activity Transferred from bed to chair  Level of Assistance Minimal assist, patient does 75% or more  Assistive Device Front wheel walker  Distance Ambulated (ft) 5 ft  Activity Response Tolerated well  Mobility Referral Yes  $Mobility charge 1 Mobility   Pt received in bed and agreed to transfer to chair. Min A for bed mobility and sit to stand. Pt is shaky during ambulation. Pt to chair with all needs met and alarm on.   Roderick Pee Mobility Specialist

## 2022-03-05 NOTE — Progress Notes (Signed)
OT Cancellation Note  Patient Details Name: Nathan Slinker [name] MRN: [id number] DOB: 08-31-1924   Cancelled Treatment:    Reason Eval/Treat Not Completed: Patient declined, no reason specified Patient declined to participate in therapy at this time. Patient reported being comfortable and wanting to stay resting in recliner. OT to continue to follow and check back as schedule will allow.  Rosalio Loud, MS Acute Rehabilitation Department Office# [phone]  03/05/2022, 2:03 PM

## 2022-03-05 NOTE — Progress Notes (Signed)
Bp still high after 10mg  Hydralazine given, Will notify Md and AM Nurse  03/05/22 0731  Vitals  BP (!) 172/101  BP Location Right Arm  MEWS COLOR  MEWS Score Color Green  MEWS Score  MEWS Temp 0  MEWS Systolic 0  MEWS Pulse 0  MEWS RR 1  MEWS LOC 0  MEWS Score 1

## 2022-03-05 NOTE — Progress Notes (Signed)
Pt HS BG  at 57mg/dl. Pt given orange juice per protocol. Results improved to 91mg/dl after 15 min recheck.  

## 2022-03-05 NOTE — TOC Transition Note (Signed)
Transition of Care [facility]) - CM/SW Discharge Note   Patient Details  Name: Nathan Kleinman [name] MRN: [id number] Date of Birth: [birth year]  [facility] CM/SW Contact:  Otelia Santee, LCSW Phone Number: 03/05/2022, 1:23 PM   Clinical Narrative:    Pt is to return to [facility] and will be going to room 146. Pt will be receiving in house PT/OT while at [facility]. RN to call report to [phone]. FL-2 and DC summary have been faxed to [employer] and placed in pt's discharge packet. PTAR called at 1:25pm to arrange transportation.    Final next level of care: Assisted Living (w/ home health) Barriers to Discharge: No Barriers Identified   Patient Goals and CMS Choice Patient states their goals for this hospitalization and ongoing recovery are:: To return to [facility] CMS Medicare.gov Compare Post Acute Care list provided to:: Patient Choice offered to / list presented to : Patient  Discharge Placement              Patient chooses bed at: [facility] Patient to be transferred to facility by: PTAR Name of family member notified: Son, Terreon Ekholm Patient and family notified of of transfer: 03/05/22  Discharge Plan and Services In-house Referral: NA Discharge Planning Services: CM Consult Post Acute Care Choice: Home Health          DME Arranged: N/A DME Agency: NA       HH Arranged: PT, OT HH Agency: Other - See comment ([facility]) Date HH Agency Contacted: 03/03/22 Time HH Agency Contacted: 1152 Representative spoke with at [facility] Agency: Tresa Endo  Social Determinants of Health (SDOH) Interventions     Readmission Risk Interventions    03/05/2022    1:21 PM 03/03/2022   11:51 AM  Readmission Risk Prevention Plan  Transportation Screening Complete Complete  PCP or Specialist Appt within 5-7 Days  Complete  PCP or Specialist Appt within 3-5 Days Complete   Home Care Screening  Complete  Medication Review (RN CM)   Complete  HRI or Home Care Consult Complete   Social Work Consult for Recovery Care Planning/Counseling Complete   Palliative Care Screening Not Applicable   Medication Review Oceanographer) Complete

## 2022-03-06 ENCOUNTER — Encounter (HOSPITAL_COMMUNITY): Payer: Self-pay

## 2022-03-06 ENCOUNTER — Emergency Department (HOSPITAL_COMMUNITY): Payer: Medicare Other

## 2022-03-06 ENCOUNTER — Other Ambulatory Visit: Payer: Self-pay

## 2022-03-06 ENCOUNTER — Inpatient Hospital Stay (HOSPITAL_COMMUNITY)
Admission: EM | Admit: 2022-03-06 | Discharge: 2022-03-11 | DRG: 637 | Disposition: A | Discharge status: Skilled Nursing Facility | Payer: Medicare Other | Source: Skilled Nursing Facility | Attending: Internal Medicine | Admitting: Internal Medicine

## 2022-03-06 DIAGNOSIS — R6510 Systemic inflammatory response syndrome (SIRS) of non-infectious origin without acute organ dysfunction: Secondary | ICD-10-CM

## 2022-03-06 DIAGNOSIS — Z7982 Long term (current) use of aspirin: Secondary | ICD-10-CM

## 2022-03-06 DIAGNOSIS — R680 Hypothermia, not associated with low environmental temperature: Secondary | ICD-10-CM | POA: Diagnosis present

## 2022-03-06 DIAGNOSIS — T68XXXA Hypothermia, initial encounter: Secondary | ICD-10-CM

## 2022-03-06 DIAGNOSIS — Z950 Presence of cardiac pacemaker: Secondary | ICD-10-CM

## 2022-03-06 DIAGNOSIS — Z96642 Presence of left artificial hip joint: Secondary | ICD-10-CM | POA: Diagnosis present

## 2022-03-06 DIAGNOSIS — E162 Hypoglycemia, unspecified: Secondary | ICD-10-CM | POA: Diagnosis not present

## 2022-03-06 DIAGNOSIS — J189 Pneumonia, unspecified organism: Secondary | ICD-10-CM | POA: Diagnosis present

## 2022-03-06 DIAGNOSIS — E11649 Type 2 diabetes mellitus with hypoglycemia without coma: Principal | ICD-10-CM | POA: Diagnosis present

## 2022-03-06 DIAGNOSIS — N400 Enlarged prostate without lower urinary tract symptoms: Secondary | ICD-10-CM | POA: Diagnosis present

## 2022-03-06 DIAGNOSIS — I495 Sick sinus syndrome: Secondary | ICD-10-CM | POA: Diagnosis present

## 2022-03-06 DIAGNOSIS — Z6821 Body mass index (BMI) 21.0-21.9, adult: Secondary | ICD-10-CM

## 2022-03-06 DIAGNOSIS — Z8249 Family history of ischemic heart disease and other diseases of the circulatory system: Secondary | ICD-10-CM

## 2022-03-06 DIAGNOSIS — K567 Ileus, unspecified: Secondary | ICD-10-CM | POA: Diagnosis present

## 2022-03-06 DIAGNOSIS — E785 Hyperlipidemia, unspecified: Secondary | ICD-10-CM | POA: Diagnosis present

## 2022-03-06 DIAGNOSIS — Z634 Disappearance and death of family member: Secondary | ICD-10-CM

## 2022-03-06 DIAGNOSIS — R627 Adult failure to thrive: Secondary | ICD-10-CM | POA: Diagnosis present

## 2022-03-06 DIAGNOSIS — Z87891 Personal history of nicotine dependence: Secondary | ICD-10-CM

## 2022-03-06 DIAGNOSIS — Z20822 Contact with and (suspected) exposure to covid-19: Secondary | ICD-10-CM | POA: Diagnosis present

## 2022-03-06 DIAGNOSIS — N179 Acute kidney failure, unspecified: Secondary | ICD-10-CM | POA: Diagnosis present

## 2022-03-06 DIAGNOSIS — Z880 Allergy status to penicillin: Secondary | ICD-10-CM

## 2022-03-06 DIAGNOSIS — E119 Type 2 diabetes mellitus without complications: Secondary | ICD-10-CM

## 2022-03-06 DIAGNOSIS — I959 Hypotension, unspecified: Secondary | ICD-10-CM | POA: Diagnosis not present

## 2022-03-06 DIAGNOSIS — Z79899 Other long term (current) drug therapy: Secondary | ICD-10-CM

## 2022-03-06 DIAGNOSIS — Z515 Encounter for palliative care: Secondary | ICD-10-CM

## 2022-03-06 DIAGNOSIS — Z66 Do not resuscitate: Secondary | ICD-10-CM | POA: Diagnosis present

## 2022-03-06 DIAGNOSIS — Z9841 Cataract extraction status, right eye: Secondary | ICD-10-CM

## 2022-03-06 DIAGNOSIS — I10 Essential (primary) hypertension: Secondary | ICD-10-CM | POA: Diagnosis present

## 2022-03-06 DIAGNOSIS — N390 Urinary tract infection, site not specified: Secondary | ICD-10-CM | POA: Diagnosis present

## 2022-03-06 DIAGNOSIS — Z794 Long term (current) use of insulin: Secondary | ICD-10-CM

## 2022-03-06 LAB — CBC WITH DIFFERENTIAL/PLATELET
Abs Immature Granulocytes: 0.16 10*3/uL — ABNORMAL HIGH (ref 0.00–0.07)
Basophils Absolute: 0.1 10*3/uL (ref 0.0–0.1)
Basophils Relative: 1 %
Eosinophils Absolute: 0.1 10*3/uL (ref 0.0–0.5)
Eosinophils Relative: 0 %
HCT: 35.6 % — ABNORMAL LOW (ref 39.0–52.0)
Hemoglobin: 11.3 g/dL — ABNORMAL LOW (ref 13.0–17.0)
Immature Granulocytes: 1 %
Lymphocytes Relative: 12 %
Lymphs Abs: 1.6 10*3/uL (ref 0.7–4.0)
MCH: 29.5 pg (ref 26.0–34.0)
MCHC: 31.7 g/dL (ref 30.0–36.0)
MCV: 93 fL (ref 80.0–100.0)
Monocytes Absolute: 0.7 10*3/uL (ref 0.1–1.0)
Monocytes Relative: 6 %
Neutro Abs: 10.8 10*3/uL — ABNORMAL HIGH (ref 1.7–7.7)
Neutrophils Relative %: 80 %
Platelets: 345 10*3/uL (ref 150–400)
RBC: 3.83 MIL/uL — ABNORMAL LOW (ref 4.22–5.81)
RDW: 15.7 % — ABNORMAL HIGH (ref 11.5–15.5)
WBC: 13.3 10*3/uL — ABNORMAL HIGH (ref 4.0–10.5)
nRBC: 0 % (ref 0.0–0.2)

## 2022-03-06 LAB — APTT: aPTT: 34 seconds (ref 24–36)

## 2022-03-06 LAB — COMPREHENSIVE METABOLIC PANEL
ALT: 15 U/L (ref 0–44)
AST: 24 U/L (ref 15–41)
Albumin: 3.5 g/dL (ref 3.5–5.0)
Alkaline Phosphatase: 56 U/L (ref 38–126)
Anion gap: 6 (ref 5–15)
BUN: 46 mg/dL — ABNORMAL HIGH (ref 8–23)
CO2: 21 mmol/L — ABNORMAL LOW (ref 22–32)
Calcium: 9.1 mg/dL (ref 8.9–10.3)
Chloride: 111 mmol/L (ref 98–111)
Creatinine, Ser: 1.2 mg/dL (ref 0.61–1.24)
GFR, Estimated: 56 mL/min — ABNORMAL LOW (ref >60)
Glucose, Bld: 181 mg/dL — ABNORMAL HIGH (ref 70–99)
Potassium: 4.3 mmol/L (ref 3.5–5.1)
Sodium: 138 mmol/L (ref 135–145)
Total Bilirubin: 0.5 mg/dL (ref 0.3–1.2)
Total Protein: 7.3 g/dL (ref 6.5–8.1)

## 2022-03-06 LAB — LACTIC ACID, PLASMA: Lactic Acid, Venous: 1.4 mmol/L (ref 0.5–1.9)

## 2022-03-06 LAB — PROTIME-INR
INR: 1.2 (ref 0.8–1.2)
Prothrombin Time: 14.7 seconds (ref 11.4–15.2)

## 2022-03-06 LAB — CBG MONITORING, ED: Glucose-Capillary: 77 mg/dL (ref 70–99)

## 2022-03-06 MED ORDER — SODIUM CHLORIDE 0.9 % IV SOLN
2.0000 g | Freq: Once | INTRAVENOUS | Status: AC
  Administered 2022-03-06: 2 g via INTRAVENOUS
  Filled 2022-03-06: qty 12.5

## 2022-03-06 MED ORDER — SODIUM CHLORIDE 0.9 % IV SOLN: 500.0000 mg | Freq: Once | INTRAVENOUS | Status: DC

## 2022-03-06 MED ORDER — DEXTROSE 10 % IV SOLN: INTRAVENOUS | Status: DC

## 2022-03-06 MED ORDER — METRONIDAZOLE 500 MG/100ML IV SOLN
500.0000 mg | Freq: Once | INTRAVENOUS | Status: AC
  Administered 2022-03-06: 500 mg via INTRAVENOUS
  Filled 2022-03-06: qty 100

## 2022-03-06 MED ORDER — SODIUM CHLORIDE 0.9 % IV SOLN: 1.0000 g | Freq: Once | INTRAVENOUS | Status: DC

## 2022-03-06 MED ORDER — LACTATED RINGERS IV BOLUS
1000.0000 mL | Freq: Once | INTRAVENOUS | Status: AC
  Administered 2022-03-06: 1000 mL via INTRAVENOUS

## 2022-03-06 NOTE — ED Provider Notes (Signed)
[facility] DEPT Provider Note: Georgena Spurling, MD, FACEP  CSN: [id number] MRN: [id number] ARRIVAL: 03/06/22 at [place]: RESA/RESA   CHIEF COMPLAINT  Pneumonia  Level 5 caveat: Altered mental status HISTORY OF PRESENT ILLNESS  03/06/22 10:58 PM Nathan Salter [name] is a 88 y.o. male nursing home resident with a history of diabetes and memory problems.  He was admitted on 01/26/2022 for sepsis due to multifocal pneumonia that was found on CT angio chest but not on plain film.  He was started on IV cefepime and Flagyl.  He was discharged home yesterday on cefdinir and Flagyl for 3 additional days.  While inpatient he was found to be MRSA negative and Legionella negative.  He was also found to have colitis but stool studies were negative for C. difficile and other pathogens.  Today he has had decreased activity and oral intake.  This evening he was minimally responsive and EMS was summoned.  His CBG was found to be 44 on scene and staff was unable to feed him due to his altered mental status.  EMS administered D10 with minimal improvement in his mental status and brought him here.  On arrival he was found to have a rectal temperature of 94.6 though his other vitals are not sepsis defining.  Nevertheless sepsis orders were initiated and he was placed in a [facility] by nursing staff.  It is noted that he has a distended abdomen.    Past Medical History:  Diagnosis Date   A-fib ([place])    Dizziness    DM (diabetes mellitus) (HCC)    Esophagitis, reflux    Heart block    Hyperlipidemia    Hypertension, essential, benign    Memory deficit    Pathological fracture of left hip due to age-related osteoporosis ([place]) 04/25/2019   SOB (shortness of breath)    SSS (sick sinus syndrome) ([place])     Past Surgical History:  Procedure Laterality Date   CATARACT EXTRACTION Right    HIP ARTHROPLASTY Left 04/22/2019   Procedure: ARTHROPLASTY BIPOLAR HIP (HEMIARTHROPLASTY);  Surgeon: Altamese Everly,  MD;  Location: [facility];  Service: Orthopedics;  Laterality: Left;   INGUINAL HERNIA REPAIR     INSERT / REPLACE / REMOVE PACEMAKER     PACEMAKER INSERTION     PPM GENERATOR CHANGEOUT N/A 04/15/2021   Procedure: PPM GENERATOR CHANGEOUT;  Surgeon: Evans Lance, MD;  Location: [facility];  Service: Cardiovascular;  Laterality: N/A;   TONSILLECTOMY AND ADENOIDECTOMY      Family History  Problem Relation Age of Onset   Heart disease Mother     Social History   Tobacco Use   Smoking status: Former    Types: Cigarettes    Quit date: 01/01/1956    Years since quitting: 66.2    Passive exposure: Never   Smokeless tobacco: Never  Vaping Use   Vaping Use: Never used  Substance Use Topics   Alcohol use: No   Drug use: No    Prior to Admission medications   Medication Sig Start Date End Date Taking? Authorizing Provider  acetaminophen (TYLENOL) 325 MG tablet Take 2 tablets (650 mg total) by mouth every 6 (six) hours as needed for mild pain (or Fever >/= 101). Patient taking differently: Take 650 mg by mouth every 6 (six) hours as needed for mild pain. 12/16/20   Roxan Hockey, MD  aspirin EC 81 MG EC tablet Take 1 tablet (81 mg total) by mouth daily with breakfast.  Swallow whole. 12/16/20   Roxan Hockey, MD  Calcium Carbonate 500 MG CHEW Chew 500 mg by mouth in the morning and at bedtime.    [provider]  carvedilol (COREG) 3.125 MG tablet Take 3.125 mg by mouth 2 (two) times daily with a meal.    [provider]  cefdinir (OMNICEF) 300 MG capsule Take 1 capsule (300 mg total) by mouth 2 (two) times daily for 3 days. 03/05/22 03/08/22  Rai, Vernelle Emerald, MD  Cholecalciferol (VITAMIN D3) 50 MCG (2000 UT) TABS Take 2,000 Units by mouth daily.    [provider]  Control Gel Formula Dressing (DUODERM CGF DRESSING EX) Apply 1 Application topically once a week. Apply to sacrum wound. May also change as needed when dressing is soiled.    [provider]  ferrous sulfate (FEROSUL) 325 (65 FE) MG tablet Take 325 mg by mouth daily.    [provider]  fluticasone (FLONASE) 50 MCG/ACT nasal spray Place 2 sprays into both nostrils daily. 03/06/22   Rai, Vernelle Emerald, MD  guaiFENesin (MUCINEX) 600 MG 12 hr tablet Take 2 tablets (1,200 mg total) by mouth 2 (two) times daily. 03/05/22   Rai, Ripudeep K, MD  insulin detemir (LEVEMIR FLEXTOUCH) 100 UNIT/ML FlexPen Inject 15 Units into the skin daily. 03/05/22   Rai, Ripudeep K, MD  insulin lispro (HUMALOG KWIKPEN) 100 UNIT/ML KwikPen Inject 5 Units into the skin 2 (two) times daily as needed (for BS >450).    [provider]  levalbuterol (XOPENEX HFA) 45 MCG/ACT inhaler Inhale 1 puff into the lungs every 4 (four) hours as needed for wheezing. 03/05/22 03/05/23  Rai, Vernelle Emerald, MD  loratadine (CLARITIN) 10 MG tablet Take 1 tablet (10 mg total) by mouth daily. 03/06/22   Rai, Vernelle Emerald, MD  losartan (COZAAR) 50 MG tablet Take 1 tablet (50 mg total) by mouth daily. 03/05/22   Rai, Vernelle Emerald, MD  metroNIDAZOLE (FLAGYL) 500 MG tablet Take 1 tablet (500 mg total) by mouth 2 (two) times daily for 3 days. 03/05/22 03/08/22  Rai, Vernelle Emerald, MD  nystatin ointment (MYCOSTATIN) Apply 1 Application topically 2 (two) times daily. To groin area    [provider]  pantoprazole (PROTONIX) 40 MG tablet Take 1 tablet (40 mg total) by mouth daily. 03/06/22   Rai, Ripudeep K, MD  PARoxetine (PAXIL) 10 MG tablet Take 10 mg by mouth daily. 03/04/21   [provider]  Skin Protectants, Misc. (BAZA PROTECT EX) Apply 1 application  topically See admin instructions. Apply to affected areas of buttocks, sacrum, and genitals twice daily. May also apply as needed for each incontinence episode    [provider]  spironolactone (ALDACTONE) 25 MG tablet Take 0.5 tablets (12.5 mg total) by mouth daily. 03/06/22   Rai, Ripudeep K, MD  UNABLE TO FIND Take 1 Tube by mouth as needed (BS less than  60 or signs of hypoglycemia). Med Name: glucose gel    [provider]  vitamin B-12 (CYANOCOBALAMIN) 1000 MCG tablet Take 2,000 mcg by mouth daily.    [provider]    Allergies Penicillins   REVIEW OF SYSTEMS  Level 5 caveat   PHYSICAL EXAMINATION  Initial Vital Signs Blood pressure (!) 159/95, pulse 63, temperature (S) (!) 94.6 F (34.8 C), temperature source Rectal, resp. rate 20, SpO2 100 %.  Examination General: Well-developed, well-nourished male in no acute distress; appearance consistent with age of record HENT: normocephalic; atraumatic Eyes: Pupils pinpoint Neck:  supple Heart: regular rate and rhythm Lungs: clear to auscultation bilaterally Abdomen: Distended; nontender; bowel sounds absent Extremities: No deformity; full range of motion; pulses normal Neurologic: Somnolent but arousable; oriented x2 Skin: Warm and dry; pale Psychiatric: Flat affect   RESULTS  Summary of this visit's results, reviewed and interpreted by myself:   EKG Interpretation  Date/Time:    Ventricular Rate:    PR Interval:    QRS Duration:   QT Interval:    QTC Calculation:   R Axis:     Text Interpretation:         Laboratory Studies: Results for orders placed or performed during the hospital encounter of 03/06/22 (from the past 24 hour(s))  CBG monitoring, ED     Status: None   Collection Time: 03/06/22 10:34 PM  Result Value Ref Range   Glucose-Capillary 77 70 - 99 mg/dL  Lactic acid, plasma     Status: None   Collection Time: 03/06/22 10:50 PM  Result Value Ref Range   Lactic Acid, Venous 1.4 0.5 - 1.9 mmol/L  Comprehensive metabolic panel     Status: Abnormal   Collection Time: 03/06/22 10:50 PM  Result Value Ref Range   Sodium 138 135 - 145 mmol/L   Potassium 4.3 3.5 - 5.1 mmol/L   Chloride 111 98 - 111 mmol/L   CO2 21 (L) 22 - 32 mmol/L   Glucose, Bld 181 (H) 70 - 99 mg/dL   BUN 46 (H) 8 - 23 mg/dL   Creatinine, Ser 1.20 0.61 - 1.24 mg/dL    Calcium 9.1 8.9 - 10.3 mg/dL   Total Protein 7.3 6.5 - 8.1 g/dL   Albumin 3.5 3.5 - 5.0 g/dL   AST 24 15 - 41 U/L   ALT 15 0 - 44 U/L   Alkaline Phosphatase 56 38 - 126 U/L   Total Bilirubin 0.5 0.3 - 1.2 mg/dL   GFR, Estimated 56 (L) >60 mL/min   Anion gap 6 5 - 15  CBC with Differential     Status: Abnormal   Collection Time: 03/06/22 10:50 PM  Result Value Ref Range   WBC 13.3 (H) 4.0 - 10.5 K/uL   RBC 3.83 (L) 4.22 - 5.81 MIL/uL   Hemoglobin 11.3 (L) 13.0 - 17.0 g/dL   HCT 35.6 (L) 39.0 - 52.0 %   MCV 93.0 80.0 - 100.0 fL   MCH 29.5 26.0 - 34.0 pg   MCHC 31.7 30.0 - 36.0 g/dL   RDW 15.7 (H) 11.5 - 15.5 %   Platelets 345 150 - 400 K/uL   nRBC 0.0 0.0 - 0.2 %   Neutrophils Relative % 80 %   Neutro Abs 10.8 (H) 1.7 - 7.7 K/uL   Lymphocytes Relative 12 %   Lymphs Abs 1.6 0.7 - 4.0 K/uL   Monocytes Relative 6 %   Monocytes Absolute 0.7 0.1 - 1.0 K/uL   Eosinophils Relative 0 %   Eosinophils Absolute 0.1 0.0 - 0.5 K/uL   Basophils Relative 1 %   Basophils Absolute 0.1 0.0 - 0.1 K/uL   Immature Granulocytes 1 %   Abs Immature Granulocytes 0.16 (H) 0.00 - 0.07 K/uL  Protime-INR     Status: None   Collection Time: 03/06/22 10:50 PM  Result Value Ref Range   Prothrombin Time 14.7 11.4 - 15.2 seconds   INR 1.2 0.8 - 1.2  APTT     Status: None   Collection Time: 03/06/22 10:50 PM  Result Value Ref Range  aPTT 34 24 - 36 seconds   Imaging Studies: DG Abd Portable 1V  Result Date: 03/06/2022 CLINICAL DATA:  Possible sepsis EXAM: PORTABLE ABDOMEN - 1 VIEW COMPARISON:  None Available. FINDINGS: Scattered large and small bowel gas is noted. No free air is seen. Dilatation of the small bowel is noted. These changes may represent an ileus although the possibility of small-bowel obstruction deserves consideration. Correlation with the physical exam is recommended. IMPRESSION: Small-bowel dilatation and gaseous distension of the colon. This may represent a generalized ileus.  Correlate with the physical exam. Electronically Signed   By: Alcide Clever M.D.   On: 03/06/2022 23:20   DG Chest Port 1 View  Result Date: 03/06/2022 CLINICAL DATA:  Weakness EXAM: PORTABLE CHEST 1 VIEW COMPARISON:  02/27/2022 FINDINGS: Cardiac shadow is mildly prominent. Pacing device is again seen. Lungs are well aerated bilaterally. No focal infiltrate or effusion is seen. No bony abnormality is noted. Scattered bowel gas is noted throughout the colon without definitive obstruction. IMPRESSION: No active disease. Electronically Signed   By: Alcide Clever M.D.   On: 03/06/2022 23:17    ED COURSE and MDM  Nursing notes, initial and subsequent vitals signs, including pulse oximetry, reviewed and interpreted by myself.  Vitals:   03/06/22 2253  BP: (!) 159/95  Pulse: 63  Resp: 20  Temp: (S) (!) 94.6 F (34.8 C)  TempSrc: Rectal  SpO2: 100%   Medications  dextrose 10 % infusion (0 mLs Intravenous Paused 03/06/22 2339)  ceFEPIme (MAXIPIME) 2 g in sodium chloride 0.9 % 100 mL IVPB (2 g Intravenous New Bag/Given 03/06/22 2331)  metroNIDAZOLE (FLAGYL) IVPB 500 mg (500 mg Intravenous New Bag/Given 03/06/22 2332)  lactated ringers bolus 1,000 mL (1,000 mLs Intravenous New Bag/Given 03/06/22 2320)   11:11 PM LR bolus and D10 drips ordered.  Chest x-ray on my review shows patchy pneumonia.  We will go ahead and start antibiotics.  Temperature Foley ordered to monitor patient's temperature.  It is unclear if the hypothermia is due to sepsis or to hypoglycemia.  11:41 PM Patient now more alert with more color in his face.  He is having a pleasant conversation with his daughter-in-law.  Temperature Foley not placed due to enlarged prostate.  Creatinine has increased since discharge consistent with an acute kidney injury.  Dr. Haroldine Laws to admit to hospitalist service.  PROCEDURES  Procedures CRITICAL CARE Performed by: Carlisle Beers Shikita Vaillancourt Total critical care time: 35 minutes Critical care time was  exclusive of separately billable procedures and treating other patients. Critical care was necessary to treat or prevent imminent or life-threatening deterioration. Critical care was time spent personally by me on the following activities: development of treatment plan with patient and/or surrogate as well as nursing, discussions with consultants, evaluation of patient's response to treatment, examination of patient, obtaining history from patient or surrogate, ordering and performing treatments and interventions, ordering and review of laboratory studies, ordering and review of radiographic studies, pulse oximetry and re-evaluation of patient's condition.   ED DIAGNOSES     ICD-10-CM   1. Multifocal pneumonia  J18.9     2. Hypoglycemia  E16.2     3. Hypothermia, initial encounter  T68.XXXA     4. SIRS (systemic inflammatory response syndrome) (HCC)  R65.10     5. AKI (acute kidney injury) (HCC)  N17.9          Alleya Demeter, [place], MD 03/07/22 [phone]
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
## 2022-03-07 DIAGNOSIS — N179 Acute kidney failure, unspecified: Secondary | ICD-10-CM

## 2022-03-07 DIAGNOSIS — E162 Hypoglycemia, unspecified: Secondary | ICD-10-CM

## 2022-03-07 DIAGNOSIS — Z87891 Personal history of nicotine dependence: Secondary | ICD-10-CM | POA: Diagnosis not present

## 2022-03-07 DIAGNOSIS — J189 Pneumonia, unspecified organism: Secondary | ICD-10-CM | POA: Diagnosis present

## 2022-03-07 DIAGNOSIS — E11649 Type 2 diabetes mellitus with hypoglycemia without coma: Secondary | ICD-10-CM | POA: Diagnosis present

## 2022-03-07 DIAGNOSIS — I959 Hypotension, unspecified: Secondary | ICD-10-CM | POA: Diagnosis not present

## 2022-03-07 DIAGNOSIS — Z9841 Cataract extraction status, right eye: Secondary | ICD-10-CM | POA: Diagnosis not present

## 2022-03-07 DIAGNOSIS — Z8249 Family history of ischemic heart disease and other diseases of the circulatory system: Secondary | ICD-10-CM | POA: Diagnosis not present

## 2022-03-07 DIAGNOSIS — T68XXXA Hypothermia, initial encounter: Secondary | ICD-10-CM

## 2022-03-07 DIAGNOSIS — Z515 Encounter for palliative care: Secondary | ICD-10-CM | POA: Diagnosis not present

## 2022-03-07 DIAGNOSIS — N390 Urinary tract infection, site not specified: Secondary | ICD-10-CM | POA: Diagnosis present

## 2022-03-07 DIAGNOSIS — Z634 Disappearance and death of family member: Secondary | ICD-10-CM | POA: Diagnosis not present

## 2022-03-07 DIAGNOSIS — N400 Enlarged prostate without lower urinary tract symptoms: Secondary | ICD-10-CM | POA: Diagnosis present

## 2022-03-07 DIAGNOSIS — K567 Ileus, unspecified: Secondary | ICD-10-CM | POA: Diagnosis present

## 2022-03-07 DIAGNOSIS — Z794 Long term (current) use of insulin: Secondary | ICD-10-CM | POA: Diagnosis not present

## 2022-03-07 DIAGNOSIS — I495 Sick sinus syndrome: Secondary | ICD-10-CM | POA: Diagnosis present

## 2022-03-07 DIAGNOSIS — R6510 Systemic inflammatory response syndrome (SIRS) of non-infectious origin without acute organ dysfunction: Secondary | ICD-10-CM

## 2022-03-07 DIAGNOSIS — Z7982 Long term (current) use of aspirin: Secondary | ICD-10-CM | POA: Diagnosis not present

## 2022-03-07 DIAGNOSIS — E785 Hyperlipidemia, unspecified: Secondary | ICD-10-CM | POA: Diagnosis present

## 2022-03-07 DIAGNOSIS — Z79899 Other long term (current) drug therapy: Secondary | ICD-10-CM | POA: Diagnosis not present

## 2022-03-07 DIAGNOSIS — I10 Essential (primary) hypertension: Secondary | ICD-10-CM | POA: Diagnosis present

## 2022-03-07 DIAGNOSIS — Z7189 Other specified counseling: Secondary | ICD-10-CM | POA: Diagnosis not present

## 2022-03-07 DIAGNOSIS — R627 Adult failure to thrive: Secondary | ICD-10-CM | POA: Diagnosis present

## 2022-03-07 DIAGNOSIS — Z20822 Contact with and (suspected) exposure to covid-19: Secondary | ICD-10-CM | POA: Diagnosis present

## 2022-03-07 DIAGNOSIS — Z950 Presence of cardiac pacemaker: Secondary | ICD-10-CM | POA: Diagnosis not present

## 2022-03-07 DIAGNOSIS — Z96642 Presence of left artificial hip joint: Secondary | ICD-10-CM | POA: Diagnosis present

## 2022-03-07 DIAGNOSIS — R680 Hypothermia, not associated with low environmental temperature: Secondary | ICD-10-CM | POA: Diagnosis present

## 2022-03-07 DIAGNOSIS — Z66 Do not resuscitate: Secondary | ICD-10-CM | POA: Diagnosis present

## 2022-03-07 LAB — URINALYSIS, ROUTINE W REFLEX MICROSCOPIC
Bilirubin Urine: NEGATIVE
Glucose, UA: NEGATIVE mg/dL
Ketones, ur: NEGATIVE mg/dL
Nitrite: NEGATIVE
Protein, ur: 30 mg/dL — AB
RBC / HPF: >50 RBC/hpf — ABNORMAL HIGH (ref 0–5)
Specific Gravity, Urine: 1.02 (ref 1.005–1.030)
WBC, UA: >50 WBC/hpf — ABNORMAL HIGH (ref 0–5)
pH: 5 (ref 5.0–8.0)

## 2022-03-07 LAB — CBG MONITORING, ED
Glucose-Capillary: 111 mg/dL — ABNORMAL HIGH (ref 70–99)
Glucose-Capillary: 115 mg/dL — ABNORMAL HIGH (ref 70–99)
Glucose-Capillary: 137 mg/dL — ABNORMAL HIGH (ref 70–99)
Glucose-Capillary: 73 mg/dL (ref 70–99)
Glucose-Capillary: 86 mg/dL (ref 70–99)

## 2022-03-07 LAB — RESP PANEL BY RT-PCR (FLU A&B, COVID) ARPGX2
Influenza A by PCR: NEGATIVE
Influenza B by PCR: NEGATIVE
SARS Coronavirus 2 by RT PCR: NEGATIVE

## 2022-03-07 MED ORDER — ACETAMINOPHEN 650 MG RE SUPP: 650.0000 mg | Freq: Four times a day (QID) | RECTAL | Status: DC | PRN

## 2022-03-07 MED ORDER — DEXTROSE IN LACTATED RINGERS 5 % IV SOLN: INTRAVENOUS | Status: DC

## 2022-03-07 MED ORDER — VITAMIN B-12 1000 MCG PO TABS
2000.0000 ug | ORAL_TABLET | Freq: Every day | ORAL | Status: DC
  Administered 2022-03-07 – 2022-03-11 (×5): 2000 ug via ORAL
  Filled 2022-03-07 (×5): qty 2

## 2022-03-07 MED ORDER — HEPARIN SODIUM (PORCINE) 5000 UNIT/ML IJ SOLN
5000.0000 [IU] | Freq: Three times a day (TID) | INTRAMUSCULAR | Status: DC
  Administered 2022-03-07 – 2022-03-11 (×12): 5000 [IU] via SUBCUTANEOUS
  Filled 2022-03-07 (×12): qty 1

## 2022-03-07 MED ORDER — INSULIN ASPART 100 UNIT/ML IJ SOLN
5.0000 [IU] | Freq: Two times a day (BID) | INTRAMUSCULAR | Status: DC | PRN
  Filled 2022-03-07: qty 0.05

## 2022-03-07 MED ORDER — VITAMIN D 25 MCG (1000 UNIT) PO TABS
2000.0000 [IU] | ORAL_TABLET | Freq: Every day | ORAL | Status: DC
  Administered 2022-03-07 – 2022-03-11 (×5): 2000 [IU] via ORAL
  Filled 2022-03-07 (×5): qty 2

## 2022-03-07 MED ORDER — FLUTICASONE PROPIONATE 50 MCG/ACT NA SUSP
2.0000 | Freq: Every day | NASAL | Status: DC
  Administered 2022-03-07 – 2022-03-11 (×5): 2 via NASAL
  Filled 2022-03-07: qty 16

## 2022-03-07 MED ORDER — CARVEDILOL 3.125 MG PO TABS
3.1250 mg | ORAL_TABLET | Freq: Two times a day (BID) | ORAL | Status: DC
  Administered 2022-03-07 – 2022-03-11 (×9): 3.125 mg via ORAL
  Filled 2022-03-07 (×9): qty 1

## 2022-03-07 MED ORDER — LORATADINE 10 MG PO TABS
10.0000 mg | ORAL_TABLET | Freq: Every day | ORAL | Status: DC
  Administered 2022-03-07 – 2022-03-11 (×5): 10 mg via ORAL
  Filled 2022-03-07 (×6): qty 1

## 2022-03-07 MED ORDER — LEVALBUTEROL TARTRATE 45 MCG/ACT IN AERO
1.0000 | INHALATION_SPRAY | RESPIRATORY_TRACT | Status: DC | PRN
  Administered 2022-03-08: 1 via RESPIRATORY_TRACT
  Filled 2022-03-07: qty 15

## 2022-03-07 MED ORDER — LACTATED RINGERS IV BOLUS
1000.0000 mL | Freq: Once | INTRAVENOUS | Status: AC
  Administered 2022-03-07: 1000 mL via INTRAVENOUS

## 2022-03-07 MED ORDER — FERROUS SULFATE 325 (65 FE) MG PO TABS
325.0000 mg | ORAL_TABLET | Freq: Every day | ORAL | Status: DC
  Administered 2022-03-07 – 2022-03-11 (×5): 325 mg via ORAL
  Filled 2022-03-07 (×5): qty 1

## 2022-03-07 MED ORDER — SODIUM CHLORIDE 0.9 % IV SOLN
2.0000 g | Freq: Two times a day (BID) | INTRAVENOUS | Status: AC
  Administered 2022-03-07 – 2022-03-08 (×4): 2 g via INTRAVENOUS
  Filled 2022-03-07 (×4): qty 12.5

## 2022-03-07 MED ORDER — PANTOPRAZOLE SODIUM 40 MG PO TBEC: 40.0000 mg | DELAYED_RELEASE_TABLET | Freq: Every day | ORAL | Status: DC

## 2022-03-07 MED ORDER — METRONIDAZOLE 500 MG/100ML IV SOLN
500.0000 mg | Freq: Two times a day (BID) | INTRAVENOUS | Status: AC
  Administered 2022-03-07 – 2022-03-08 (×4): 500 mg via INTRAVENOUS
  Filled 2022-03-07 (×4): qty 100

## 2022-03-07 MED ORDER — LOSARTAN POTASSIUM 25 MG PO TABS: 50.0000 mg | ORAL_TABLET | Freq: Every day | ORAL | Status: DC

## 2022-03-07 MED ORDER — ACETAMINOPHEN 325 MG PO TABS: 650.0000 mg | ORAL_TABLET | Freq: Four times a day (QID) | ORAL | Status: DC | PRN

## 2022-03-07 MED ORDER — PANTOPRAZOLE SODIUM 40 MG IV SOLR
40.0000 mg | INTRAVENOUS | Status: DC
  Administered 2022-03-07 – 2022-03-10 (×4): 40 mg via INTRAVENOUS
  Filled 2022-03-07 (×4): qty 10

## 2022-03-07 MED ORDER — SPIRONOLACTONE 12.5 MG HALF TABLET
12.5000 mg | ORAL_TABLET | Freq: Every day | ORAL | Status: DC
  Filled 2022-03-07: qty 1

## 2022-03-07 MED ORDER — GUAIFENESIN ER 600 MG PO TB12
1200.0000 mg | ORAL_TABLET | Freq: Two times a day (BID) | ORAL | Status: DC
  Administered 2022-03-07 – 2022-03-11 (×7): 1200 mg via ORAL
  Filled 2022-03-07 (×7): qty 2

## 2022-03-07 MED ORDER — SENNOSIDES-DOCUSATE SODIUM 8.6-50 MG PO TABS: 1.0000 | ORAL_TABLET | Freq: Every evening | ORAL | Status: DC | PRN

## 2022-03-07 MED ORDER — SODIUM CHLORIDE 0.9 % IV BOLUS
1000.0000 mL | Freq: Once | INTRAVENOUS | Status: AC
  Administered 2022-03-07: 1000 mL via INTRAVENOUS

## 2022-03-07 MED ORDER — METRONIDAZOLE 500 MG/100ML IV SOLN: 500.0000 mg | Freq: Two times a day (BID) | INTRAVENOUS | Status: DC

## 2022-03-07 MED ORDER — PAROXETINE HCL 10 MG PO TABS
10.0000 mg | ORAL_TABLET | Freq: Every day | ORAL | Status: DC
  Administered 2022-03-07 – 2022-03-11 (×5): 10 mg via ORAL
  Filled 2022-03-07 (×5): qty 1

## 2022-03-07 MED ORDER — ASPIRIN 81 MG PO TBEC
81.0000 mg | DELAYED_RELEASE_TABLET | Freq: Every day | ORAL | Status: DC
  Administered 2022-03-07 – 2022-03-11 (×5): 81 mg via ORAL
  Filled 2022-03-07 (×5): qty 1

## 2022-03-07 MED ORDER — INSULIN GLARGINE-YFGN 100 UNIT/ML ~~LOC~~ SOLN: 7.0000 [IU] | Freq: Every day | SUBCUTANEOUS | Status: DC

## 2022-03-07 NOTE — ED Notes (Signed)
Orange juice provided.  

## 2022-03-07 NOTE — H&P (Addendum)
PCP:   Sunday SpillersNguyen, Hong Thu T, NP   Chief Complaint:  Decreased mentation  HPI: This is a 88 year old male with past medical history of  DM2, GERD, HTN, HLD, SSS s/p PPM.  He was admitted here 11/22 to 11/29, discharged yesterday.  He was admitted with a diagnosis of sepsis from multifocal pneumonia, and colitis.  He was discharged to the nursing home on antibiotics cefdinir and Flagyl.  Today he was noted to have decreased mentation with decreased oral intake and activity.  This progress as the evening wore on.  Fingerstick blood sugar check was 44.  911 was called, he was transported to the ER.    In-route and ambulance he was given to D10, by time he arrived in the ER his mentation had improved.  His fingerstick blood sugar was 180.  He had a rectal temperature of 94.9.  A Bair hugger was placed on him.  Hospitalist asked to admit.   Review of Systems:  Unable to obtain secondary to patient not recalling yesterday's events  Past Medical History: Past Medical History:  Diagnosis Date   A-fib (HCC)    Dizziness    DM (diabetes mellitus) (HCC)    Esophagitis, reflux    Heart block    Hyperlipidemia    Hypertension, essential, benign    Memory deficit    Pathological fracture of left hip due to age-related osteoporosis (HCC) 04/25/2019   SOB (shortness of breath)    SSS (sick sinus syndrome) (HCC)    Past Surgical History:  Procedure Laterality Date   CATARACT EXTRACTION Right    HIP ARTHROPLASTY Left 04/22/2019   Procedure: ARTHROPLASTY BIPOLAR HIP (HEMIARTHROPLASTY);  Surgeon: Myrene GalasHandy, Michael, MD;  [facility];  Service: Orthopedics;  Laterality: Left;   INGUINAL HERNIA REPAIR     INSERT / REPLACE / REMOVE PACEMAKER     PACEMAKER INSERTION     PPM GENERATOR CHANGEOUT N/A 04/15/2021   Procedure: PPM GENERATOR CHANGEOUT;  Surgeon: Marinus Mawaylor, Gregg W, MD;  Location: [facility];  Service: Cardiovascular;  Laterality: N/A;   TONSILLECTOMY AND ADENOIDECTOMY       Medications: Prior to Admission medications   Medication Sig Start Date End Date Taking? Authorizing Provider  acetaminophen (TYLENOL) 325 MG tablet Take 2 tablets (650 mg total) by mouth every 6 (six) hours as needed for mild pain (or Fever >/= 101). Patient taking differently: Take 650 mg by mouth every 6 (six) hours as needed for mild pain. 12/16/20   Shon HaleEmokpae, Courage, MD  aspirin EC 81 MG EC tablet Take 1 tablet (81 mg total) by mouth daily with breakfast. Swallow whole. 12/16/20   Shon HaleEmokpae, Courage, MD  Calcium Carbonate 500 MG CHEW Chew 500 mg by mouth in the morning and at bedtime.    [provider]  carvedilol (COREG) 3.125 MG tablet Take 3.125 mg by mouth 2 (two) times daily with a meal.    [provider]  cefdinir (OMNICEF) 300 MG capsule Take 1 capsule (300 mg total) by mouth 2 (two) times daily for 3 days. 03/05/22 03/08/22  Rai, Delene Ruffiniipudeep K, MD  Cholecalciferol (VITAMIN D3) 50 MCG (2000 UT) TABS Take 2,000 Units by mouth daily.    [provider]  Control Gel Formula Dressing (DUODERM CGF DRESSING EX) Apply 1 Application topically once a week. Apply to sacrum wound. May also change as needed when dressing is soiled.    [provider]  ferrous sulfate (FEROSUL) 325 (65 FE) MG tablet Take 325 mg  by mouth daily.    [provider]  fluticasone (FLONASE) 50 MCG/ACT nasal spray Place 2 sprays into both nostrils daily. 03/06/22   Rai, Delene Ruffini, MD  guaiFENesin (MUCINEX) 600 MG 12 hr tablet Take 2 tablets (1,200 mg total) by mouth 2 (two) times daily. 03/05/22   Rai, Ripudeep K, MD  insulin detemir (LEVEMIR FLEXTOUCH) 100 UNIT/ML FlexPen Inject 15 Units into the skin daily. 03/05/22   Rai, Ripudeep K, MD  insulin lispro (HUMALOG KWIKPEN) 100 UNIT/ML KwikPen Inject 5 Units into the skin 2 (two) times daily as needed (for BS >450).    [provider]  levalbuterol (XOPENEX HFA) 45 MCG/ACT inhaler Inhale 1 puff into the lungs every 4  (four) hours as needed for wheezing. 03/05/22 03/05/23  Rai, Delene Ruffini, MD  loratadine (CLARITIN) 10 MG tablet Take 1 tablet (10 mg total) by mouth daily. 03/06/22   Rai, Delene Ruffini, MD  losartan (COZAAR) 50 MG tablet Take 1 tablet (50 mg total) by mouth daily. 03/05/22   Rai, Delene Ruffini, MD  metroNIDAZOLE (FLAGYL) 500 MG tablet Take 1 tablet (500 mg total) by mouth 2 (two) times daily for 3 days. 03/05/22 03/08/22  Rai, Delene Ruffini, MD  nystatin ointment (MYCOSTATIN) Apply 1 Application topically 2 (two) times daily. To groin area    [provider]  pantoprazole (PROTONIX) 40 MG tablet Take 1 tablet (40 mg total) by mouth daily. 03/06/22   Rai, Ripudeep K, MD  PARoxetine (PAXIL) 10 MG tablet Take 10 mg by mouth daily. 03/04/21   [provider]  Skin Protectants, Misc. (BAZA PROTECT EX) Apply 1 application  topically See admin instructions. Apply to affected areas of buttocks, sacrum, and genitals twice daily. May also apply as needed for each incontinence episode    [provider]  spironolactone (ALDACTONE) 25 MG tablet Take 0.5 tablets (12.5 mg total) by mouth daily. 03/06/22   Rai, Ripudeep K, MD  UNABLE TO FIND Take 1 Tube by mouth as needed (BS less than 60 or signs of hypoglycemia). Med Name: glucose gel    [provider]  vitamin B-12 (CYANOCOBALAMIN) 1000 MCG tablet Take 2,000 mcg by mouth daily.    [provider]    Allergies:   Allergies  Allergen Reactions   Penicillins Other (See Comments)    Reaction is not listed on MAR    Social History:  reports that he quit smoking about 66 years ago. His smoking use included cigarettes. He has never been exposed to tobacco smoke. He has never used smokeless tobacco. He reports that he does not drink alcohol and does not use drugs.  Family History: Family History  Problem Relation Age of Onset   Heart disease Mother     Physical Exam: Vitals:   03/06/22 2345 03/07/22 0000 03/07/22 0015  03/07/22 0023  BP: (!) 132/99 (!) 176/79 (!) 159/77   Pulse: 60 60 78   Resp: (!) 27 (!) 21 18   Temp:    (!) 95.1 F (35.1 C)  TempSrc:    Rectal  SpO2: 100% 100% 99%     General: Awake but somewhat lethargic.  No acute distress Eyes: PERRLA, pink conjunctiva, no scleral icterus ENT: Dry oral mucosa, neck supple, no thyromegaly Lungs: clear to ascultation, no wheeze, no crackles, no use of accessory muscles Cardiovascular: regular rate and rhythm, no regurgitation, no gallops, no murmurs. No carotid bruits, no JVD Abdomen: soft, decreased BS, distended abdomen: No TTP,, no organomegaly, not an  acute abdomen GU: not examined Neuro: CN II - XII grossly intact, sensation intact Musculoskeletal: strength 5/5 all extremities, no clubbing, cyanosis or edema Skin: no rash, no subcutaneous crepitation, no decubitus Psych: appropriate patient   Labs on Admission:  Recent Labs    03/05/22 0550 03/06/22 2250  NA 142 138  K 3.7 4.3  CL 112* 111  CO2 22 21*  GLUCOSE 87 181*  BUN 30* 46*  CREATININE 0.79 1.20  CALCIUM 8.4* 9.1  MG 1.9  --    Recent Labs    03/06/22 2250  AST 24  ALT 15  ALKPHOS 56  BILITOT 0.5  PROT 7.3  ALBUMIN 3.5    Recent Labs    03/05/22 0550 03/06/22 2250  WBC 13.3* 13.3*  NEUTROABS 9.1* 10.8*  HGB 11.3* 11.3*  HCT 34.3* 35.6*  MCV 91.5 93.0  PLT 246 345    Micro Results: Recent Results (from the past 240 hour(s))  Blood Culture (routine x 2)     Status: None   Collection Time: 02/26/22  9:09 AM   Specimen: BLOOD  Result Value Ref Range Status   Specimen Description   Final    BLOOD SITE NOT SPECIFIED Performed at [facility], [address] [place], [place] [address]    Special Requests   Final    BOTTLES DRAWN AEROBIC AND ANAEROBIC Blood Culture adequate volume Performed at [facility], [address], [place], [place] [address]    Culture   Final    NO GROWTH 5 DAYS Performed at [facility], [address] [place], [place] [address]    Report Status 03/03/2022 FINAL  Final  Blood Culture (routine x 2)     Status: None   Collection Time: 02/26/22  9:23 AM   Specimen: BLOOD RIGHT ARM  Result Value Ref Range Status   Specimen Description   Final    BLOOD RIGHT ARM Performed at [facility], [address], [place], [place] [address]    Special Requests   Final    BOTTLES DRAWN AEROBIC AND ANAEROBIC Blood Culture results may not be optimal due to an excessive volume of blood received in culture bottles Performed at [facility], [address] [place], [place] [address]    Culture   Final    NO GROWTH 5 DAYS Performed at [facility], [address] [place], [place] [address]    Report Status 03/03/2022 FINAL  Final  Resp Panel by RT-PCR (Flu A&B, Covid) Anterior Nasal Swab     Status: None   Collection Time: 02/26/22  9:52 AM   Specimen: Anterior Nasal Swab  Result Value Ref Range Status   SARS Coronavirus 2 by RT PCR NEGATIVE NEGATIVE Final    Comment: (NOTE) SARS-CoV-2 target nucleic acids are NOT DETECTED.  The SARS-CoV-2 RNA is generally detectable in upper respiratory specimens during the acute phase of infection. The lowest concentration of SARS-CoV-2 viral copies this assay can detect is 138 copies/mL. A negative result does not preclude SARS-Cov-2 infection and should not be used as the sole basis for treatment or other patient management decisions. A negative result may occur with  improper specimen collection/handling, submission of specimen other than nasopharyngeal swab, presence of viral mutation(s) within the areas targeted by this assay, and inadequate number of viral copies(<138 copies/mL). A negative result must be combined with clinical observations, patient history, and epidemiological information. The expected result is Negative.  Fact Sheet for Patients:   BloggerCourse.com  Fact Sheet for Healthcare Providers:  SeriousBroker.it  This test is no t yet approved or cleared by the [place] FDA and  has been authorized for detection and/or diagnosis of SARS-CoV-2 by FDA under an Emergency Use Authorization (EUA). This EUA will remain  in effect (meaning this test can be used) for the duration of the COVID-19 declaration under Section 564(b)(1) of the Act, 21 U.S.C.section 360bbb-3(b)(1), unless the authorization is terminated  or revoked sooner.       Influenza A by PCR NEGATIVE NEGATIVE Final   Influenza B by PCR NEGATIVE NEGATIVE Final    Comment: (NOTE) The Xpert Xpress SARS-CoV-2/FLU/RSV plus assay is intended as an aid in the diagnosis of influenza from Nasopharyngeal swab specimens and should not be used as a sole basis for treatment. Nasal washings and aspirates are unacceptable for Xpert Xpress SARS-CoV-2/FLU/RSV testing.  Fact Sheet for Patients: BloggerCourse.com  Fact Sheet for Healthcare Providers: SeriousBroker.it  This test is not yet approved or cleared by the [place] FDA and has been authorized for detection and/or diagnosis of SARS-CoV-2 by FDA under an Emergency Use Authorization (EUA). This EUA will remain in effect (meaning this test can be used) for the duration of the COVID-19 declaration under Section 564(b)(1) of the Act, 21 U.S.C. section 360bbb-3(b)(1), unless the authorization is terminated or revoked.  Performed at [facility], [address] [place], [place] [address]   C Difficile Quick Screen w PCR reflex     Status: None   Collection Time: 02/26/22 10:32 AM   Specimen: STOOL  Result Value Ref Range Status   C Diff antigen NEGATIVE NEGATIVE Final   C Diff toxin NEGATIVE NEGATIVE Final   C Diff interpretation No C. difficile detected.  Final    Comment: Performed at  [facility], [address] [place], [place] [address]  MRSA Next Gen by PCR, Nasal     Status: None   Collection Time: 02/26/22  1:32 PM   Specimen: Nasal Mucosa; Nasal Swab  Result Value Ref Range Status   MRSA by PCR Next Gen NOT DETECTED NOT DETECTED Final    Comment: (NOTE) The GeneXpert MRSA Assay (FDA approved for NASAL specimens only), is one component of a comprehensive MRSA colonization surveillance program. It is not intended to diagnose MRSA infection nor to guide or monitor treatment for MRSA infections. Test performance is not FDA approved in patients less than 3 years old. Performed at [facility], [address], [place], [place] [address]   Gastrointestinal Panel by PCR , Stool     Status: None   Collection Time: 02/27/22 10:31 AM   Specimen: Stool  Result Value Ref Range Status   Campylobacter species NOT DETECTED NOT DETECTED Final   Plesimonas shigelloides NOT DETECTED NOT DETECTED Final   Salmonella species NOT DETECTED NOT DETECTED Final   Yersinia enterocolitica NOT DETECTED NOT DETECTED Final   Vibrio species NOT DETECTED NOT DETECTED Final   Vibrio cholerae NOT DETECTED NOT DETECTED Final   Enteroaggregative E coli (EAEC) NOT DETECTED NOT DETECTED Final   Enteropathogenic E coli (EPEC) NOT DETECTED NOT DETECTED Final   Enterotoxigenic E coli (ETEC) NOT DETECTED NOT DETECTED Final   Shiga like toxin producing E coli (STEC) NOT DETECTED NOT DETECTED Final   Shigella/Enteroinvasive E coli (EIEC) NOT DETECTED NOT DETECTED Final   Cryptosporidium NOT DETECTED NOT DETECTED Final   Cyclospora cayetanensis NOT DETECTED NOT  DETECTED Final   Entamoeba histolytica NOT DETECTED NOT DETECTED Final   Giardia lamblia NOT DETECTED NOT DETECTED Final   Adenovirus F40/41 NOT DETECTED NOT DETECTED Final   Astrovirus NOT DETECTED NOT DETECTED Final   Norovirus GI/GII NOT DETECTED NOT DETECTED Final   Rotavirus A NOT DETECTED NOT  DETECTED Final   Sapovirus (I, II, IV, and V) NOT DETECTED NOT DETECTED Final    Comment: Performed at [facility], [address], [place], [place] [address]  Urine Culture     Status: None   Collection Time: 02/27/22  5:51 PM   Specimen: Urine, Catheterized  Result Value Ref Range Status   Specimen Description   Final    URINE, CATHETERIZED Performed at [facility], [address] [place], [place] [address]    Special Requests   Final    NONE Performed at [facility], [address], [place], [place] [address]    Culture   Final    NO GROWTH Performed at [facility], [address], [place], [place] [address]    Report Status 02/28/2022 FINAL  Final     Radiological Exams on Admission: DG Abd Portable 1V  Result Date: 03/06/2022 CLINICAL DATA:  Possible sepsis EXAM: PORTABLE ABDOMEN - 1 VIEW COMPARISON:  None Available. FINDINGS: Scattered large and small bowel gas is noted. No free air is seen. Dilatation of the small bowel is noted. These changes may represent an ileus although the possibility of small-bowel obstruction deserves consideration. Correlation with the physical exam is recommended. IMPRESSION: Small-bowel dilatation and gaseous distension of the colon. This may represent a generalized ileus. Correlate with the physical exam. Electronically Signed   By: Alcide Clever M.D.   On: 03/06/2022 23:20   DG Chest Port 1 View  Result Date: 03/06/2022 CLINICAL DATA:  Weakness EXAM: PORTABLE CHEST 1 VIEW COMPARISON:  02/27/2022 FINDINGS: Cardiac shadow is mildly prominent. Pacing device is again seen. Lungs are well aerated bilaterally. No focal infiltrate or effusion is seen. No bony abnormality is noted. Scattered bowel gas is noted throughout the colon without definitive obstruction. IMPRESSION: No active disease. Electronically Signed   By: Alcide Clever M.D.   On: 03/06/2022 23:17    Assessment/Plan Present on  Admission:  Hypoglycemia associated with type 2 diabetes mellitus (HCC) Admit to stepdown -hold lantus -D5 LR at 75 cc an hour -Sliding scale insulin ordered  Hypothermia -I believe this  is more likely hypoglycemia related than sepsis. Sepsis protocol initiated in ER.  Patient recultured.  Medications of cefepime, Flagyl resumed cont  -nursing to document % of meals eaten  Possible ileus -Patient n.p.o. d/t possible ileus and decreased mentation -Reevaluate -IV fluid hydration  Recovering from multi focal pneumonia -Antibiotics resumed Possible UTI -Await cultures.  On cefepime   Hypertension -Patient had a brief episode of hypotension, was positive to IV fluid bolus. -Spironolactone, Coreg, losartan held.    Nathan Alvarez 03/07/2022, 12:41 AM

## 2022-03-07 NOTE — Hospital Course (Signed)
88 year old male with past medical history of  DM2, GERD, HTN, HLD, SSS s/p PPM.  He was admitted here 11/22 to 11/29, discharged yesterday.  He was admitted with a diagnosis of sepsis from multifocal pneumonia, and colitis.  He was discharged to the nursing home on antibiotics cefdinir and Flagyl.  Today he was noted to have decreased mentation with decreased oral intake and activity.  This progress as the evening wore on.  Fingerstick blood sugar check was 44.  911 was called, he was transported to the ER.     In-route and ambulance he was given to D10, by time he arrived in the ER his mentation had improved.  His fingerstick blood sugar was 180.  He had a rectal temperature of 94.9.  A Bair hugger was placed on him.  Hospitalist asked to admit.

## 2022-03-07 NOTE — Progress Notes (Signed)
Per Candise Bowens with [facility]- pt and family wants their services at discharge.

## 2022-03-07 NOTE — Progress Notes (Signed)
  Progress Note   Patient: Nathan Pillsbury [name] FKC:[id number] DOB: 24-Feb-1925 DOA: 03/06/2022     0 DOS: the patient was seen and examined on 03/07/2022   Brief hospital course: 88 year old male with past medical history of  DM2, GERD, HTN, HLD, SSS s/p PPM.  He was admitted here 11/22 to 11/29, discharged yesterday.  He was admitted with a diagnosis of sepsis from multifocal pneumonia, and colitis.  He was discharged to the nursing home on antibiotics cefdinir and Flagyl.  Today he was noted to have decreased mentation with decreased oral intake and activity.  This progress as the evening wore on.  Fingerstick blood sugar check was 44.  911 was called, he was transported to the ER.     In-route and ambulance he was given to D10, by time he arrived in the ER his mentation had improved.  His fingerstick blood sugar was 180.  He had a rectal temperature of 94.9.  A Bair hugger was placed on him.  Hospitalist asked to admit.   Assessment and Plan: Present on Admission:  Hypoglycemia associated with type 2 diabetes mellitus (HCC) -lantus now on hold -continue D5 LR at 75 cc an hour -continue SSI as needed   Hypothermia -resolved -likely secondary to presenting hypoglycemia that has resolved    Ileus seen on imaging -denies abd pain this AM, tolerated sips with RN -Start clears, plan to advance as tolerated -continue IV fluid hydration   Recovering from multi focal pneumonia -Antibiotics resumed, currently on cefepime  UTI  -Urinalysis suggestive of UTI -Await cultures.  On cefepime    Hypertension -Patient had a brief episode of hypotension, was positive to IV fluid bolus. -continued coreg      Subjective: Reports feeling better  Physical Exam: Vitals:   03/07/22 1600 03/07/22 1630 03/07/22 1700 03/07/22 1730  BP: (!) 153/63 (!) 156/77 (!) 165/73 (!) 147/72  Pulse: 61 68 64 62  Resp: (!) 21 (!) 21 (!) 21 (!) 21  Temp:    (!) 97.2 F (36.2 C)  TempSrc:      SpO2: 100% 100%  100% 100%   General exam: Awake, laying in bed, in nad Respiratory system: Normal respiratory effort, no wheezing Cardiovascular system: regular rate, s1, s2 Gastrointestinal system: Soft, nondistended, positive BS Central nervous system: CN2-12 grossly intact, strength intact Extremities: Perfused, no clubbing Skin: Normal skin turgor, no notable skin lesions seen Psychiatry: Mood normal // no visual hallucinations   Data Reviewed:  Labs reviewed: Na 138, Cr 1.2, WBC 13.3  Family Communication: Pt in room, family not at bedside  Disposition: Status is: Inpatient Remains inpatient appropriate because: Severity of illness  Planned Discharge Destination: Home and Skilled nursing facility     Author: Rickey Barbara, MD 03/07/2022 6:08 PM  For on call review [email or web address].

## 2022-03-07 NOTE — ED Notes (Signed)
Pt had small bowel movement. Mepilex to the sacral, bilateral posterior heels and bilateral buttocks that were placed prior to arrival remain in place.

## 2022-03-07 NOTE — ED Notes (Addendum)
Update provided to son [phone]

## 2022-03-07 NOTE — Progress Notes (Signed)
Pharmacy Antibiotic Note  Arnold Kester [name] is a 88 y.o. male admitted on 03/06/2022 with pna, just discharged from hospital on 11/29.  Pharmacy has been consulted for cefepime dosing.  Plan: Cefepime 2gm IV q12h Follow renal function and clinical course     Temp (24hrs), Avg:94.9 F (34.9 C), Min:94.6 F (34.8 C), Max:95.1 F (35.1 C)  Recent Labs  Lab 03/02/22 0535 03/03/22 0418 03/04/22 0504 03/05/22 0550 03/06/22 2250  WBC 10.7* 10.3 13.1* 13.3* 13.3*  CREATININE 0.92 0.86 0.76 0.79 1.20  LATICACIDVEN  --   --   --   --  1.4    Estimated Creatinine Clearance: 33 mL/min (by C-G formula based on SCr of 1.2 mg/dL).    Allergies  Allergen Reactions   Penicillins Other (See Comments)    Reaction is not listed on MAR    Antimicrobials this admission: 12/1 cefepime >>  Dose adjustments this admission:   Microbiology results: 11/30 BCx:  11/30 UCx:    Thank you for allowing pharmacy to be a part of this patient's care.  Arley Phenix RPh 03/07/2022, 1:04 AM

## 2022-03-08 DIAGNOSIS — N179 Acute kidney failure, unspecified: Secondary | ICD-10-CM | POA: Diagnosis not present

## 2022-03-08 DIAGNOSIS — E11649 Type 2 diabetes mellitus with hypoglycemia without coma: Secondary | ICD-10-CM | POA: Diagnosis not present

## 2022-03-08 DIAGNOSIS — J189 Pneumonia, unspecified organism: Secondary | ICD-10-CM | POA: Diagnosis not present

## 2022-03-08 DIAGNOSIS — E162 Hypoglycemia, unspecified: Secondary | ICD-10-CM | POA: Diagnosis not present

## 2022-03-08 LAB — GLUCOSE, CAPILLARY
Glucose-Capillary: 148 mg/dL — ABNORMAL HIGH (ref 70–99)
Glucose-Capillary: 153 mg/dL — ABNORMAL HIGH (ref 70–99)
Glucose-Capillary: 191 mg/dL — ABNORMAL HIGH (ref 70–99)
Glucose-Capillary: 132 mg/dL — ABNORMAL HIGH (ref 70–99)

## 2022-03-08 LAB — CBC
HCT: 29.8 % — ABNORMAL LOW (ref 39.0–52.0)
Hemoglobin: 9.6 g/dL — ABNORMAL LOW (ref 13.0–17.0)
MCH: 29.6 pg (ref 26.0–34.0)
MCHC: 32.2 g/dL (ref 30.0–36.0)
MCV: 92 fL (ref 80.0–100.0)
Platelets: 283 10*3/uL (ref 150–400)
RBC: 3.24 MIL/uL — ABNORMAL LOW (ref 4.22–5.81)
RDW: 15.9 % — ABNORMAL HIGH (ref 11.5–15.5)
WBC: 10.9 10*3/uL — ABNORMAL HIGH (ref 4.0–10.5)
nRBC: 0 % (ref 0.0–0.2)

## 2022-03-08 LAB — COMPREHENSIVE METABOLIC PANEL
ALT: 12 U/L (ref 0–44)
AST: 19 U/L (ref 15–41)
Albumin: 2.6 g/dL — ABNORMAL LOW (ref 3.5–5.0)
Alkaline Phosphatase: 40 U/L (ref 38–126)
Anion gap: 7 (ref 5–15)
BUN: 27 mg/dL — ABNORMAL HIGH (ref 8–23)
CO2: 19 mmol/L — ABNORMAL LOW (ref 22–32)
Calcium: 8.1 mg/dL — ABNORMAL LOW (ref 8.9–10.3)
Chloride: 115 mmol/L — ABNORMAL HIGH (ref 98–111)
Creatinine, Ser: 0.91 mg/dL (ref 0.61–1.24)
GFR, Estimated: >60 mL/min (ref >60)
Glucose, Bld: 121 mg/dL — ABNORMAL HIGH (ref 70–99)
Potassium: 3.7 mmol/L (ref 3.5–5.1)
Sodium: 141 mmol/L (ref 135–145)
Total Bilirubin: 0.4 mg/dL (ref 0.3–1.2)
Total Protein: 5.5 g/dL — ABNORMAL LOW (ref 6.5–8.1)

## 2022-03-08 LAB — URINE CULTURE: Culture: 20000 — AB

## 2022-03-08 MED ORDER — LOSARTAN POTASSIUM 50 MG PO TABS
50.0000 mg | ORAL_TABLET | Freq: Every day | ORAL | Status: DC
  Administered 2022-03-08 – 2022-03-11 (×4): 50 mg via ORAL
  Filled 2022-03-08 (×4): qty 1

## 2022-03-08 MED ORDER — GUAIFENESIN-DM 100-10 MG/5ML PO SYRP
5.0000 mL | ORAL_SOLUTION | ORAL | Status: DC | PRN
  Administered 2022-03-08: 5 mL via ORAL
  Filled 2022-03-08: qty 5

## 2022-03-08 MED ORDER — GLUCERNA SHAKE PO LIQD
237.0000 mL | Freq: Three times a day (TID) | ORAL | Status: DC
  Administered 2022-03-09 – 2022-03-11 (×6): 237 mL via ORAL
  Filled 2022-03-08 (×10): qty 237

## 2022-03-08 MED ORDER — SPIRONOLACTONE 12.5 MG HALF TABLET
12.5000 mg | ORAL_TABLET | Freq: Every day | ORAL | Status: DC
  Administered 2022-03-08 – 2022-03-11 (×4): 12.5 mg via ORAL
  Filled 2022-03-08 (×4): qty 1

## 2022-03-08 MED ORDER — INSULIN ASPART 100 UNIT/ML IJ SOLN: 0.0000 [IU] | Freq: Every day | INTRAMUSCULAR | Status: DC

## 2022-03-08 MED ORDER — ADULT MULTIVITAMIN W/MINERALS CH
1.0000 | ORAL_TABLET | Freq: Every day | ORAL | Status: DC
  Administered 2022-03-08 – 2022-03-11 (×4): 1 via ORAL
  Filled 2022-03-08 (×5): qty 1

## 2022-03-08 MED ORDER — INSULIN ASPART 100 UNIT/ML IJ SOLN
0.0000 [IU] | Freq: Three times a day (TID) | INTRAMUSCULAR | Status: DC
  Administered 2022-03-08: 2 [IU] via SUBCUTANEOUS
  Administered 2022-03-09: 7 [IU] via SUBCUTANEOUS
  Administered 2022-03-11: 2 [IU] via SUBCUTANEOUS

## 2022-03-08 NOTE — Progress Notes (Signed)
Progress Note   Patient: Nathan Southers [name] AVW:[id number] DOB: [birth year] DOA: 03/06/2022     1 DOS: the patient was seen and examined on 03/08/2022   Brief hospital course: 88 year old male with past medical history of  DM2, GERD, HTN, HLD, SSS s/p PPM.  He was admitted here 11/22 to 11/29, discharged yesterday.  He was admitted with a diagnosis of sepsis from multifocal pneumonia, and colitis.  He was discharged to the nursing home on antibiotics cefdinir and Flagyl.  Today he was noted to have decreased mentation with decreased oral intake and activity.  This progress as the evening wore on.  Fingerstick blood sugar check was 44.  911 was called, he was transported to the ER.     In-route and ambulance he was given to D10, by time he arrived in the ER his mentation had improved.  His fingerstick blood sugar was 180.  He had a rectal temperature of 94.9.  A Bair hugger was placed on him.  Hospitalist asked to admit.   Assessment and Plan: Present on Admission:  Hypoglycemia associated with type 2 diabetes mellitus (HCC) -lantus now on hold -glycemic trends now stable, have d/c'd d5 fluids -Will liberalize diet given family concerns of patient's failure to thrive, see below   Hypothermia -resolved -likely secondary to presenting hypoglycemia that has resolved    Ileus seen on imaging -denies abd pain this AM, tolerated sips with RN -Start clears, plan to advance as tolerated -continue IV fluid hydration   Recovering from multi focal pneumonia -Antibiotics resumed, currently on cefepime and flagyl, to complete course tonight -on minimal O2 support  UTI  -Urinalysis suggestive of UTI -only some yeast in urine cx, otherwise neg -to complete course of cefepime    Hypertension -Patient had a brief episode of hypotension, was positive to IV fluid bolus. -continued coreg  Failure to thrive -Met with family at bedside. Great concern of pt's gradual decline overtime and very poor  PO intake -Pt has prior hx of esophageal strictures, however family wishes to hold off on any aggressive measures, including esephageal dilation -Pt's son reports that pt will attempt to eat in hospital, however when left on his own, will not eat, leading to hypoglycemic attacks and failure to thrive -Family reports decline began after the death of his wife -Pt currently DNR. Family in agreement to discuss further with Palliative Care. Have consulted Palliative Care      Subjective: States feeling better today  Physical Exam: Vitals:   03/07/22 1938 03/07/22 2328 03/08/22 0454 03/08/22 1154  BP: (!) 176/75 (!) 148/76 (!) 166/82 (!) 170/93  Pulse: 69 69 69 65  Resp: (!) _0 Temp: 98.1 F (36.7 C) 98.6 F (37 C) 98.5 F (36.9 C) 97.7 F (36.5 C)  TempSrc: Oral Oral Oral Oral  SpO2: 96% 95% 94% 95%  Weight: 61.9 kg     Height: _1  (1.702 m)      General exam: Conversant, in no acute distress Respiratory system: normal chest rise, clear, no audible wheezing Cardiovascular system: regular rhythm, s1-s2 Gastrointestinal system: Nondistended, nontender, pos BS Central nervous system: No seizures, no tremors Extremities: No cyanosis, no joint deformities Skin: No rashes, no pallor Psychiatry: Affect normal // no auditory hallucinations   Data Reviewed:  Labs reviewed: Na 141, Cr 0.91, WBC 10.9  Family Communication: Pt in room, family not at bedside  Disposition: Status is: Inpatient Remains inpatient appropriate because: Severity of illness  Planned Discharge Destination: Home  and Skilled nursing facility     Author: Marylu Lund, MD 03/08/2022 6:27 PM  For on call review [email or web address].

## 2022-03-08 NOTE — Progress Notes (Signed)
Initial Nutrition Assessment  DOCUMENTATION CODES:   Not applicable  INTERVENTION:  - Add MVI q day.   - Add Glucerna Shake po TID, each supplement provides 220 kcal and 10 grams of protein   NUTRITION DIAGNOSIS:   Inadequate oral intake related to poor appetite as evidenced by meal completion < 50%.  GOAL:   Patient will meet greater than or equal to 90% of their needs  MONITOR:   PO intake, Supplement acceptance  REASON FOR ASSESSMENT:   Malnutrition Screening Tool, Consult Assessment of nutrition requirement/status  ASSESSMENT:   88 y.o. male admits related to decreased mentation. PMH includes: T2DM, GERD, HTN, HLD. Pt is currently receiving medical management related to hypoglycemia associated with T2DM.  Meds include: Vit D, Vit B12, ferrous sulfate, sliding scale insulin, aldactone. Labs reviewed.   The pt was admitted recently with poor PO intakes. RD will add supplements and continue to monitor PO intakes. Wts stable per record. Will attempt to gather nutrition hx details at follow up.   NUTRITION - FOCUSED PHYSICAL EXAM:  Unable to assess at this time due to remote assessment. Will attempt at follow up.   Diet Order:   Diet Order             DIET DYS 3 Room service appropriate? Yes; Fluid consistency: Thin  Diet effective now                   EDUCATION NEEDS:   Not appropriate for education at this time  Skin:  Skin Assessment: Skin Integrity Issues: Skin Integrity Issues:: DTI DTI: bilateral buttocks  Last BM:  12/2  Height:   Ht Readings from Last 1 Encounters:  03/07/22 5\' 7"  (1.702 m)    Weight:   Wt Readings from Last 1 Encounters:  03/07/22 61.9 kg    Ideal Body Weight:     BMI:  Body mass index is 21.37 kg/m.  Estimated Nutritional Needs:   Kcal:  1545-1855 kcals  Protein:  75-90 gm  Fluid:  >/= 1.5 L  14/01/23, RD, LDN, CNSC.

## 2022-03-08 NOTE — Plan of Care (Addendum)
Received patient as new admit from ED. Alert and oriented x 3-4. No reports of pain. Denies SOB,on RA. + ronchi. Frequent cough noted. On tele and pulse ox. Primofit in placed. Stage 2 and IASD on buttocks. IVF fluids continued, IVF antibiotics given. VS monitored. Safety and fall precautions observed. Bed alarm on. Call bell within reach.   Problem: Fluid Volume: Goal: Hemodynamic stability will improve Outcome: Progressing   Problem: Clinical Measurements: Goal: Signs and symptoms of infection will decrease Outcome: Progressing   Problem: Respiratory: Goal: Ability to maintain adequate ventilation will improve Outcome: Progressing   Problem: Education: Goal: Knowledge of General Education information will improve Description: Including pain rating scale, medication(s)/side effects and non-pharmacologic comfort measures Outcome: Progressing   Problem: Health Behavior/Discharge Planning: Goal: Ability to manage health-related needs will improve Outcome: Progressing   Problem: Clinical Measurements: Goal: Ability to maintain clinical measurements within normal limits will improve Outcome: Progressing Goal: Will remain free from infection Outcome: Progressing Goal: Respiratory complications will improve Outcome: Progressing   Problem: Activity: Goal: Risk for activity intolerance will decrease Outcome: Progressing   Problem: Nutrition: Goal: Adequate nutrition will be maintained Outcome: Progressing   Problem: Elimination: Goal: Will not experience complications related to urinary retention Outcome: Progressing   Problem: Pain Managment: Goal: General experience of comfort will improve Outcome: Progressing   Problem: Safety: Goal: Ability to remain free from injury will improve Outcome: Progressing   Problem: Skin Integrity: Goal: Risk for impaired skin integrity will decrease Outcome: Progressing

## 2022-03-09 DIAGNOSIS — E11649 Type 2 diabetes mellitus with hypoglycemia without coma: Secondary | ICD-10-CM | POA: Diagnosis not present

## 2022-03-09 DIAGNOSIS — E162 Hypoglycemia, unspecified: Secondary | ICD-10-CM | POA: Diagnosis not present

## 2022-03-09 DIAGNOSIS — N179 Acute kidney failure, unspecified: Secondary | ICD-10-CM | POA: Diagnosis not present

## 2022-03-09 DIAGNOSIS — J189 Pneumonia, unspecified organism: Secondary | ICD-10-CM | POA: Diagnosis not present

## 2022-03-09 LAB — COMPREHENSIVE METABOLIC PANEL
ALT: 11 U/L (ref 0–44)
AST: 16 U/L (ref 15–41)
Albumin: 2.7 g/dL — ABNORMAL LOW (ref 3.5–5.0)
Alkaline Phosphatase: 47 U/L (ref 38–126)
Anion gap: 8 (ref 5–15)
BUN: 25 mg/dL — ABNORMAL HIGH (ref 8–23)
CO2: 21 mmol/L — ABNORMAL LOW (ref 22–32)
Calcium: 7.9 mg/dL — ABNORMAL LOW (ref 8.9–10.3)
Chloride: 112 mmol/L — ABNORMAL HIGH (ref 98–111)
Creatinine, Ser: 0.88 mg/dL (ref 0.61–1.24)
GFR, Estimated: >60 mL/min (ref >60)
Glucose, Bld: 117 mg/dL — ABNORMAL HIGH (ref 70–99)
Potassium: 3.5 mmol/L (ref 3.5–5.1)
Sodium: 141 mmol/L (ref 135–145)
Total Bilirubin: 0.6 mg/dL (ref 0.3–1.2)
Total Protein: 5.8 g/dL — ABNORMAL LOW (ref 6.5–8.1)

## 2022-03-09 LAB — CBC
HCT: 29.4 % — ABNORMAL LOW (ref 39.0–52.0)
Hemoglobin: 9.7 g/dL — ABNORMAL LOW (ref 13.0–17.0)
MCH: 30.1 pg (ref 26.0–34.0)
MCHC: 33 g/dL (ref 30.0–36.0)
MCV: 91.3 fL (ref 80.0–100.0)
Platelets: 285 10*3/uL (ref 150–400)
RBC: 3.22 MIL/uL — ABNORMAL LOW (ref 4.22–5.81)
RDW: 15.8 % — ABNORMAL HIGH (ref 11.5–15.5)
WBC: 10.7 10*3/uL — ABNORMAL HIGH (ref 4.0–10.5)
nRBC: 0 % (ref 0.0–0.2)

## 2022-03-09 LAB — GLUCOSE, CAPILLARY
Glucose-Capillary: 123 mg/dL — ABNORMAL HIGH (ref 70–99)
Glucose-Capillary: 305 mg/dL — ABNORMAL HIGH (ref 70–99)
Glucose-Capillary: 87 mg/dL (ref 70–99)
Glucose-Capillary: 123 mg/dL — ABNORMAL HIGH (ref 70–99)

## 2022-03-09 NOTE — Evaluation (Signed)
Physical Therapy Evaluation Patient Details Name: Nathan Dwan [name] MRN: [id number] DOB: [birth year] Today's Date: 03/09/2022  History of Present Illness  Patient is a 88 year old male admitted from [facility] assisted living 2* weakness, hypoglycemia, hypothermia, possible ileus and currently recovering from multifocal PNA with recent hospital admit. PMH: DM II, HTN, SSS s/p PPM, HLD.  Clinical Impression   Patient is a 88 year old male who was admitted for above. Patient was living at ALF at wheelchair level with limited ambulation using walker and caregiver support for ADL tasks. Patient was min A with RW x2 to transfer from edge of bed to [facility] for toileting and then to recliner in room. Patient quick to fatigue during task.Patient was noted to have decreased functional activity tolerance, decreased standing balance, and decreased safety awareness.  Patient would continue to benefit from skilled PT services at this time while admitted and after d/c to address noted deficits in order and maximize IND and safety.         Recommendations for follow up therapy are one component of a multi-disciplinary discharge planning process, led by the attending physician.  Recommendations may be updated based on patient status, additional functional criteria and insurance authorization.  Follow Up Recommendations Home health PT      Assistance Recommended at Discharge Intermittent Supervision/Assistance  Patient can return home with the following  A little help with walking and/or transfers;A little help with bathing/dressing/bathroom;Assistance with cooking/housework;Assist for transportation;Help with stairs or ramp for entrance    Equipment Recommendations None recommended by PT  Recommendations for Other Services       Functional Status Assessment Patient has had a recent decline in their functional status and demonstrates the ability to make significant improvements in function in a reasonable  and predictable amount of time.     Precautions / Restrictions Precautions Precautions: Fall Restrictions Weight Bearing Restrictions: No      Mobility  Bed Mobility Overal bed mobility: Needs Assistance Bed Mobility: Supine to Sit     Supine to sit: Mod assist, HOB elevated     General bed mobility comments: Assist to bring trunk to upright and to complete rotation to EOB sitting.    Transfers Overall transfer level: Needs assistance Equipment used: Rolling walker (2 wheels) Transfers: Sit to/from Stand, Bed to chair/wheelchair/BSC Sit to Stand: Min assist, From elevated surface   Step pivot transfers: Min assist       General transfer comment: min assist to bring wt up and fwd and to balance in initial standing; step/pvt bed to [facility] to recliner    Ambulation/Gait               General Gait Details: step pvt bed to [facility] to recliner only 2* SOB  Stairs            Wheelchair Mobility    Modified Rankin (Stroke Patients Only)       Balance Overall balance assessment: Needs assistance Sitting-balance support: No upper extremity supported, Feet supported Sitting balance-Leahy Scale: Good     Standing balance support: Single extremity supported Standing balance-Leahy Scale: Poor                               Pertinent Vitals/Pain Pain Assessment Pain Assessment: No/denies pain    Home Living Family/patient expects to be discharged to:: Assisted living  Home Equipment: Conservation officer, nature (2 wheels);Rollator (4 wheels);Shower seat;Grab bars - tub/shower      Prior Function Prior Level of Function : Needs assist             Mobility Comments: uses w/c mostly but RW some in room per patient report. ADLs Comments: Requires assist for ADLs     Hand Dominance   Dominant Hand: Right    Extremity/Trunk Assessment   Upper Extremity Assessment Upper Extremity Assessment: Overall WFL for tasks assessed     Lower Extremity Assessment Lower Extremity Assessment: Defer to PT evaluation    Cervical / Trunk Assessment Cervical / Trunk Assessment: Kyphotic  Communication   Communication: HOH  Cognition Arousal/Alertness: Awake/alert Behavior During Therapy: WFL for tasks assessed/performed Overall Cognitive Status: Within Functional Limits for tasks assessed                                          General Comments      Exercises     Assessment/Plan    PT Assessment Patient needs continued PT services  PT Problem List Decreased strength;Decreased activity tolerance;Decreased balance;Decreased mobility;Decreased knowledge of use of DME       PT Treatment Interventions DME instruction;Gait training;Functional mobility training;Therapeutic activities;Therapeutic exercise;Patient/family education    PT Goals (Current goals can be found in the Care Plan section)  Acute Rehab PT Goals Patient Stated Goal: Regain IND PT Goal Formulation: With patient Time For Goal Achievement: 03/21/22 Potential to Achieve Goals: Good    Frequency Min 3X/week     Co-evaluation               AM-PAC PT "6 Clicks" Mobility  Outcome Measure Help needed turning from your back to your side while in a flat bed without using bedrails?: A Little Help needed moving from lying on your back to sitting on the side of a flat bed without using bedrails?: A Little Help needed moving to and from a bed to a chair (including a wheelchair)?: A Little Help needed standing up from a chair using your arms (e.g., wheelchair or bedside chair)?: A Little Help needed to walk in hospital room?: A Lot Help needed climbing 3-5 steps with a railing? : Total 6 Click Score: 15    End of Session Equipment Utilized During Treatment: Gait belt Activity Tolerance: Patient tolerated treatment well Patient left: with call bell/phone within reach;in chair;with chair alarm set Nurse Communication: Mobility  status PT Visit Diagnosis: Difficulty in walking, not elsewhere classified (R26.2);Muscle weakness (generalized) (M62.81)    Time: [id number] PT Time Calculation (min) (ACUTE ONLY): 24 min   Charges:   PT Evaluation $PT Eval Low Complexity: 1 Low PT Treatments $Therapeutic Activity: 8-22 mins        Debe Coder PT Acute Rehabilitation Services Pager [phone] Office [phone]   Trustin Chapa 03/09/2022, 2:51 PM

## 2022-03-09 NOTE — Progress Notes (Signed)
Progress Note   Patient: Nathan Snowdon [name] SLH:[id number] DOB: 07/25/1924 DOA: 03/06/2022     2 DOS: the patient was seen and examined on 03/09/2022   Brief hospital course: 88 year old male with past medical history of  DM2, GERD, HTN, HLD, SSS s/p PPM.  He was admitted here 11/22 to 11/29, discharged yesterday.  He was admitted with a diagnosis of sepsis from multifocal pneumonia, and colitis.  He was discharged to the nursing home on antibiotics cefdinir and Flagyl.  Today he was noted to have decreased mentation with decreased oral intake and activity.  This progress as the evening wore on.  Fingerstick blood sugar check was 44.  911 was called, he was transported to the ER.     In-route and ambulance he was given to D10, by time he arrived in the ER his mentation had improved.  His fingerstick blood sugar was 180.  He had a rectal temperature of 94.9.  A Bair hugger was placed on him.  Hospitalist asked to admit.   Assessment and Plan: Present on Admission:  Hypoglycemia associated with type 2 diabetes mellitus ([place]) -lantus now on hold -glycemic trends now stable, have d/c'd d5 fluids -Will liberalize diet given family concerns of patient's failure to thrive, see below -glucose trends seem stable   Hypothermia -resolved -likely secondary to presenting hypoglycemia that has resolved    Ileus seen on imaging -now tolerating diet   Recovering from multi focal pneumonia -Antibiotics completed on 03/08/22 -on minimal O2 support  UTI  -Urinalysis suggestive of UTI -only some yeast in urine cx, otherwise neg -to completed course of cefepime    Hypertension -Patient had a brief episode of hypotension, was positive to IV fluid bolus. -continued coreg  Failure to thrive -Met with family at bedside. Great concern of pt's gradual decline overtime and very poor PO intake -Pt has prior hx of esophageal strictures, however family wishes to hold off on any aggressive measures,  including esephageal dilation -Pt's son reports that pt will attempt to eat in hospital, however when left on his own, will not eat, leading to hypoglycemic attacks and failure to thrive -Family reports decline began after the death of his wife -Pt currently DNR. Family in agreement to discuss further with Palliative Care. -Pending consult by Palliative Care. Suspect pt would be ideal candidate for hospice      Subjective: without complaints. Reports tolerating breakfast well  Physical Exam: Vitals:   03/08/22 1154 03/08/22 2034 03/09/22 0517 03/09/22 1249  BP: (!) 170/93 (!) 142/78 (!) 156/78 (!) 143/79  Pulse: 65 68 80 71  Resp: _0 Temp: 97.7 F (36.5 C) 98.4 F (36.9 C) 97.6 F (36.4 C) (!) 97.5 F (36.4 C)  TempSrc: Oral Oral  Oral  SpO2: 95% 95% 96% 97%  Weight:      Height:       General exam: Awake, laying in bed, in nad Respiratory system: Normal respiratory effort, no wheezing Cardiovascular system: regular rate, s1, s2 Gastrointestinal system: Soft, nondistended, positive BS Central nervous system: CN2-12 grossly intact, strength intact Extremities: Perfused, no clubbing Skin: Normal skin turgor, no notable skin lesions seen Psychiatry: Mood normal // no visual hallucinations   Data Reviewed:  Labs reviewed: Na 141, Cr 0.88, WBC 10.7  Family Communication: Pt in room, family not at bedside  Disposition: Status is: Inpatient Remains inpatient appropriate because: Severity of illness  Planned Discharge Destination: Home and Skilled nursing facility     Author: Annie Main  Wyline Copas, MD 03/09/2022 5:50 PM  For on call review [email or web address].

## 2022-03-09 NOTE — Plan of Care (Signed)
Patient alert and oriented x 3-4. No reports of pain and SOB. Frequent and persistent coughing noted. Patient refused cough medicine. Tele and pulse ox in placed. External cath in placed. VS monitored. Safety and fall precautions observed. Call bell within reach.   Problem: Fluid Volume: Goal: Hemodynamic stability will improve Outcome: Progressing   Problem: Clinical Measurements: Goal: Signs and symptoms of infection will decrease Outcome: Progressing   Problem: Respiratory: Goal: Ability to maintain adequate ventilation will improve Outcome: Progressing   Problem: Clinical Measurements: Goal: Ability to maintain clinical measurements within normal limits will improve Outcome: Progressing   Problem: Pain Managment: Goal: General experience of comfort will improve Outcome: Progressing   Problem: Safety: Goal: Ability to remain free from injury will improve Outcome: Progressing

## 2022-03-09 NOTE — Evaluation (Addendum)
Occupational Therapy Evaluation Patient Details Name: Nathan Dripps [name] MRN: [id number] DOB: 02/03/1925 Today's Date: 03/09/2022   History of Present Illness Patient is a 88 year old male admitted from [facility] assisted living 2* weakness, hypoglycemia, hypothermia, possible ileus and currently recovering from multifocal PNA with recent hospital admit. PMH: DM II, HTN, SSS s/p PPM, HLD.   Clinical Impression   Mr. Nathan Alvarez is a 88 year old man who presents with impaired balance, generalized weakness and decreased activity tolerance. On evaluation he is mod assist to stand from recliner, min assist to ambulate with walker, and min guard to stand statically. He is able to take his hands off the walker but needs one hand to stabilize himself for clothing management. He needs max assist for LB dressing and setup to mod assist for all other Adls. He reports he can typically toilet himself. Patient will benefit from skilled OT services while in hospital to improve deficits and learn compensatory strategies as needed in order to return to PLOF.  Recommend HH OT at discharge.       Recommendations for follow up therapy are one component of a multi-disciplinary discharge planning process, led by the attending physician.  Recommendations may be updated based on patient status, additional functional criteria and insurance authorization.   Follow Up Recommendations  Home health OT     Assistance Recommended at Discharge Intermittent Supervision/Assistance  Patient can return home with the following A little help with walking and/or transfers;A little help with bathing/dressing/bathroom;Direct supervision/assist for financial management;Help with stairs or ramp for entrance;Direct supervision/assist for medications management;Assist for transportation;Assistance with cooking/housework    Functional Status Assessment  Patient has had a recent decline in their functional status and/or  demonstrates limited ability to make significant improvements in function in a reasonable and predictable amount of time  Equipment Recommendations  None recommended by OT    Recommendations for Other Services       Precautions / Restrictions Precautions Precautions: Fall Restrictions Weight Bearing Restrictions: No      Mobility Bed Mobility                    Transfers                          Balance Overall balance assessment: Needs assistance Sitting-balance support: No upper extremity supported, Feet supported Sitting balance-Leahy Scale: Good     Standing balance support: During functional activity, Reliant on assistive device for balance Standing balance-Leahy Scale: Fair Standing balance comment: able to take hands of walker and stand, typically uses one hand to steady himself                           ADL either performed or assessed with clinical judgement   ADL Overall ADL's : Needs assistance/impaired Eating/Feeding: Set up;Sitting   Grooming: Set up;Sitting   Upper Body Bathing: Minimal assistance;Sitting   Lower Body Bathing: Sit to/from stand;Moderate assistance   Upper Body Dressing : Minimal assistance;Sitting   Lower Body Dressing: Maximal assistance;Sit to/from stand   Toilet Transfer: Minimal assistance;Rolling walker (2 wheels);BSC/3in1   Toileting- Clothing Manipulation and Hygiene: Moderate assistance;Sit to/from stand       Functional mobility during ADLs: Moderate assistance;Rolling walker (2 wheels) General ADL Comments: Mod assist to power up from recliner. Min assist with ambulate with walker     Vision Patient Visual Report: No change from baseline  Perception     Praxis      Pertinent Vitals/Pain Pain Assessment Pain Assessment: No/denies pain     Hand Dominance Right   Extremity/Trunk Assessment Upper Extremity Assessment Upper Extremity Assessment: Overall WFL for tasks  assessed   Lower Extremity Assessment Lower Extremity Assessment: Defer to PT evaluation   Cervical / Trunk Assessment Cervical / Trunk Assessment: Kyphotic   Communication Communication Communication: HOH   Cognition Arousal/Alertness: Awake/alert Behavior During Therapy: WFL for tasks assessed/performed Overall Cognitive Status: Within Functional Limits for tasks assessed                                       General Comments       Exercises     Shoulder Instructions      Home Living Family/patient expects to be discharged to:: Assisted living                             Home Equipment: Rolling Walker (2 wheels);Rollator (4 wheels);Shower seat;Grab bars - tub/shower          Prior Functioning/Environment Prior Level of Function : Needs assist             Mobility Comments: uses w/c mostly but RW some in room per patient report. ADLs Comments: Requires assist for ADLs - reports he can get to bathroom with walker or wc and doesn't have assist for toileting mostly. Needs assist for LB dressing.        OT Problem List: Impaired balance (sitting and/or standing);Decreased coordination;Decreased activity tolerance;Decreased knowledge of precautions;Decreased knowledge of use of DME or AE      OT Treatment/Interventions: Self-care/ADL training;Patient/family education;Therapeutic activities;Balance training;DME and/or AE instruction    OT Goals(Current goals can be found in the care plan section) Acute Rehab OT Goals Patient Stated Goal: regain strength OT Goal Formulation: With patient Time For Goal Achievement: 03/23/22 Potential to Achieve Goals: Good  OT Frequency: Min 2X/week    Co-evaluation              AM-PAC OT "6 Clicks" Daily Activity     Outcome Measure Help from another person eating meals?: A Little Help from another person taking care of personal grooming?: A Little Help from another person toileting, which  includes using toliet, bedpan, or urinal?: A Lot Help from another person bathing (including washing, rinsing, drying)?: A Lot Help from another person to put on and taking off regular upper body clothing?: A Little Help from another person to put on and taking off regular lower body clothing?: A Lot 6 Click Score: 15   End of Session Equipment Utilized During Treatment: Rolling walker (2 wheels) Nurse Communication: Mobility status  Activity Tolerance: Patient tolerated treatment well Patient left: in chair;with call bell/phone within reach;with chair alarm set;with nursing/sitter in room  OT Visit Diagnosis: Unsteadiness on feet (R26.81);Muscle weakness (generalized) (M62.81);Other abnormalities of gait and mobility (R26.89)                Time: 6283-6629 OT Time Calculation (min): 9 min Charges:  OT General Charges $OT Visit: 1 Visit OT Evaluation $OT Eval Low Complexity: 1 Low  Donnella Sham, OTR/L Acute Care Rehab Services  Office [phone]   Kelli Churn 03/09/2022, 1:48 PM

## 2022-03-10 DIAGNOSIS — Z515 Encounter for palliative care: Secondary | ICD-10-CM

## 2022-03-10 DIAGNOSIS — E11649 Type 2 diabetes mellitus with hypoglycemia without coma: Secondary | ICD-10-CM | POA: Diagnosis not present

## 2022-03-10 DIAGNOSIS — Z7189 Other specified counseling: Secondary | ICD-10-CM | POA: Diagnosis not present

## 2022-03-10 DIAGNOSIS — E162 Hypoglycemia, unspecified: Secondary | ICD-10-CM | POA: Diagnosis not present

## 2022-03-10 DIAGNOSIS — J189 Pneumonia, unspecified organism: Secondary | ICD-10-CM | POA: Diagnosis not present

## 2022-03-10 LAB — COMPREHENSIVE METABOLIC PANEL
ALT: 11 U/L (ref 0–44)
AST: 16 U/L (ref 15–41)
Albumin: 2.6 g/dL — ABNORMAL LOW (ref 3.5–5.0)
Alkaline Phosphatase: 42 U/L (ref 38–126)
Anion gap: 8 (ref 5–15)
BUN: 25 mg/dL — ABNORMAL HIGH (ref 8–23)
CO2: 21 mmol/L — ABNORMAL LOW (ref 22–32)
Calcium: 8 mg/dL — ABNORMAL LOW (ref 8.9–10.3)
Chloride: 111 mmol/L (ref 98–111)
Creatinine, Ser: 0.94 mg/dL (ref 0.61–1.24)
GFR, Estimated: >60 mL/min (ref >60)
Glucose, Bld: 108 mg/dL — ABNORMAL HIGH (ref 70–99)
Potassium: 3.5 mmol/L (ref 3.5–5.1)
Sodium: 140 mmol/L (ref 135–145)
Total Bilirubin: 0.4 mg/dL (ref 0.3–1.2)
Total Protein: 5.5 g/dL — ABNORMAL LOW (ref 6.5–8.1)

## 2022-03-10 LAB — GLUCOSE, CAPILLARY
Glucose-Capillary: 116 mg/dL — ABNORMAL HIGH (ref 70–99)
Glucose-Capillary: 121 mg/dL — ABNORMAL HIGH (ref 70–99)
Glucose-Capillary: 112 mg/dL — ABNORMAL HIGH (ref 70–99)
Glucose-Capillary: 101 mg/dL — ABNORMAL HIGH (ref 70–99)

## 2022-03-10 LAB — CBC
HCT: 28.8 % — ABNORMAL LOW (ref 39.0–52.0)
Hemoglobin: 9.4 g/dL — ABNORMAL LOW (ref 13.0–17.0)
MCH: 30.3 pg (ref 26.0–34.0)
MCHC: 32.6 g/dL (ref 30.0–36.0)
MCV: 92.9 fL (ref 80.0–100.0)
Platelets: 273 10*3/uL (ref 150–400)
RBC: 3.1 MIL/uL — ABNORMAL LOW (ref 4.22–5.81)
RDW: 16.2 % — ABNORMAL HIGH (ref 11.5–15.5)
WBC: 12.6 10*3/uL — ABNORMAL HIGH (ref 4.0–10.5)
nRBC: 0 % (ref 0.0–0.2)

## 2022-03-10 MED ORDER — PANTOPRAZOLE SODIUM 40 MG PO TBEC
40.0000 mg | DELAYED_RELEASE_TABLET | Freq: Every day | ORAL | Status: DC
  Administered 2022-03-11: 40 mg via ORAL
  Filled 2022-03-10: qty 1

## 2022-03-10 NOTE — Plan of Care (Signed)
Patient alert and oriented x 3-4. Persistent cough noted. Denies SOB and pain. External catheter in placed. Tele and pulse ox in placed. VS monitored. Safety and fall precautions observed.    Problem: Respiratory: Goal: Ability to maintain adequate ventilation will improve Outcome: Progressing   Problem: Pain Managment: Goal: General experience of comfort will improve Outcome: Progressing   Problem: Safety: Goal: Ability to remain free from injury will improve Outcome: Progressing   Problem: Skin Integrity: Goal: Risk for impaired skin integrity will decrease Outcome: Progressing

## 2022-03-10 NOTE — Consult Note (Signed)
Consultation Note Date: 03/10/2022   Patient Name: Nathan Hoying [name]  DOB: [birth year]  MRN: [id number]  Age / Sex: 88 y.o., male  PCP: Sunday Spillers, NP Referring Physician: Jerald Kief, MD  Reason for Consultation: Establishing goals of care  HPI/Patient Profile: 88 y.o. male   admitted on 03/06/2022    Clinical Assessment and Goals of Care: 88 year old gentleman with diabetes GERD hypertension dyslipidemia sick sinus syndrome status post pacemaker was recently in the hospital from November 22 to March 05, 2022.  Was admitted with sepsis multifocal pneumonia colitis was discharged to nursing home on antibiotics however was brought back in with altered mental status decreased oral intake and was admitted to hospital with hypoglycemia, hypothermia and ileus.  Palliative medicine team consulted for ongoing goals of care discussions.  Patient is resting in bed with eyes closed, does not verbalize or interact much.  Call placed and was able to reach family, discussed with daughter-in-law.  Previously, goals of care were discussed and it was noted that the patient has had gradual progressive decline, has been having poor oral intake and has prior history of esophageal strictures.  Patient's family wishes to continue with a more comfort focused mode of care and they elected for addition of hospice services at the patient's current facility.  Discussed about the type of care from a hospice perspective and what that looks like.  End-of-life signs and symptoms also briefly discussed.  Patient's family is aware of the serious and terminal nature of the patient's overall condition and desire addition of hospice and focus on avoiding pain and suffering at end-of-life.  HCPOA  Son and daughter-in-law.  SUMMARY OF RECOMMENDATIONS   DNR/DNI Continue current mode of care Transfer back to [facility] with hospice  services. Thank you for the consult.  Code Status/Advance Care Planning: DNR   Symptom Management:     Palliative Prophylaxis:  Aspiration    Psycho-social/Spiritual:  Desire for further Chaplaincy support:yes Additional Recommendations: Education on Hospice  Prognosis:  < 6 months  Discharge Planning:  back to [facility] with hospice.        Primary Diagnoses: Present on Admission:  Hypertension  Pacemaker  Multifocal pneumonia  Hypertension, essential, benign  Hypotension  Hypoglycemia associated with type 2 diabetes mellitus (HCC)  Hypoglycemia   I have reviewed the medical record, interviewed the patient and family, and examined the patient. The following aspects are pertinent.  Past Medical History:  Diagnosis Date   A-fib (HCC)    Dizziness    DM (diabetes mellitus) (HCC)    Esophagitis, reflux    Heart block    Hyperlipidemia    Hypertension, essential, benign    Memory deficit    Pathological fracture of left hip due to age-related osteoporosis (HCC) 04/25/2019   SOB (shortness of breath)    SSS (sick sinus syndrome) (HCC)    Social History   Socioeconomic History   Marital status: Widowed    Spouse name: Not on file   Number of children:  Not on file   Years of education: Not on file   Highest education level: Not on file  Occupational History   Not on file  Tobacco Use   Smoking status: Former    Types: Cigarettes    Quit date: 01/01/1956    Years since quitting: 66.2    Passive exposure: Never   Smokeless tobacco: Never  Vaping Use   Vaping Use: Never used  Substance and Sexual Activity   Alcohol use: No   Drug use: No   Sexual activity: Not Currently    Comment: MARRIED  Other Topics Concern   Not on file  Social History Narrative   Not on file   Social Determinants of Health   Financial Resource Strain: Not on file  Food Insecurity: No Food Insecurity (03/07/2022)   Hunger Vital Sign    Worried About Running Out of Food  in the Last Year: Never true    Ran Out of Food in the Last Year: Never true  Transportation Needs: No Transportation Needs (03/07/2022)   PRAPARE - Administrator, Civil Service (Medical): No    Lack of Transportation (Non-Medical): No  Physical Activity: Not on file  Stress: Not on file  Social Connections: Not on file   Family History  Problem Relation Age of Onset   Heart disease Mother    Scheduled Meds:  aspirin EC  81 mg Oral Q breakfast   carvedilol  3.125 mg Oral BID WC   cholecalciferol  2,000 Units Oral Daily   cyanocobalamin  2,000 mcg Oral Daily   feeding supplement (GLUCERNA SHAKE)  237 mL Oral TID BM   ferrous sulfate  325 mg Oral Daily   fluticasone  2 spray Each Nare Daily   guaiFENesin  1,200 mg Oral BID   heparin  5,000 Units Subcutaneous Q8H   insulin aspart  0-5 Units Subcutaneous QHS   insulin aspart  0-9 Units Subcutaneous TID WC   loratadine  10 mg Oral Daily   losartan  50 mg Oral Daily   multivitamin with minerals  1 tablet Oral Daily   [START ON 03/11/2022] pantoprazole  40 mg Oral Daily   PARoxetine  10 mg Oral Daily   spironolactone  12.5 mg Oral Daily   Continuous Infusions:  dextrose 5% lactated ringers 50 mL/hr at 03/08/22 1027   PRN Meds:.acetaminophen **OR** acetaminophen, guaiFENesin-dextromethorphan, levalbuterol, senna-docusate Medications Prior to Admission:  Prior to Admission medications   Medication Sig Start Date End Date Taking? Authorizing Provider  acetaminophen (TYLENOL) 325 MG tablet Take 2 tablets (650 mg total) by mouth every 6 (six) hours as needed for mild pain (or Fever >/= 101). Patient taking differently: Take 650 mg by mouth every 6 (six) hours as needed for mild pain. 12/16/20  Yes Shon Hale, MD  aspirin EC 81 MG EC tablet Take 1 tablet (81 mg total) by mouth daily with breakfast. Swallow whole. 12/16/20  Yes Shon Hale, MD  Calcium Carbonate 500 MG CHEW Chew 500 mg by mouth in the morning and at  bedtime.   Yes [provider]  carvedilol (COREG) 3.125 MG tablet Take 3.125 mg by mouth 2 (two) times daily with a meal.   Yes [provider]  Cholecalciferol (VITAMIN D3) 50 MCG (2000 UT) TABS Take 2,000 Units by mouth daily.   Yes [provider]  Control Gel Formula Dressing (DUODERM CGF DRESSING EX) Apply 1 Application topically once a week. Apply to sacrum wound. May also change  as needed when dressing is soiled.   Yes [provider]  ferrous sulfate (FEROSUL) 325 (65 FE) MG tablet Take 325 mg by mouth daily.   Yes [provider]  fluticasone (FLONASE) 50 MCG/ACT nasal spray Place 2 sprays into both nostrils daily. 03/06/22  Yes Rai, Ripudeep K, MD  guaiFENesin (MUCINEX) 600 MG 12 hr tablet Take 2 tablets (1,200 mg total) by mouth 2 (two) times daily. 03/05/22  Yes Rai, Ripudeep K, MD  insulin detemir (LEVEMIR FLEXTOUCH) 100 UNIT/ML FlexPen Inject 15 Units into the skin daily. 03/05/22  Yes Rai, Ripudeep K, MD  insulin lispro (HUMALOG KWIKPEN) 100 UNIT/ML KwikPen Inject 5 Units into the skin 2 (two) times daily as needed (for BS >450).   Yes [provider]  levalbuterol (XOPENEX HFA) 45 MCG/ACT inhaler Inhale 1 puff into the lungs every 4 (four) hours as needed for wheezing. 03/05/22 03/05/23 Yes Rai, Ripudeep K, MD  loratadine (CLARITIN) 10 MG tablet Take 1 tablet (10 mg total) by mouth daily. 03/06/22  Yes Rai, Ripudeep K, MD  losartan (COZAAR) 50 MG tablet Take 1 tablet (50 mg total) by mouth daily. 03/05/22  Yes Rai, Ripudeep K, MD  nystatin ointment (MYCOSTATIN) Apply 1 Application topically 2 (two) times daily. To groin area   Yes [provider]  pantoprazole (PROTONIX) 40 MG tablet Take 1 tablet (40 mg total) by mouth daily. 03/06/22  Yes Rai, Ripudeep K, MD  PARoxetine (PAXIL) 10 MG tablet Take 10 mg by mouth daily. 03/04/21  Yes [provider]  Skin Protectants, Misc. (BAZA PROTECT EX) Apply 1 application   topically See admin instructions. Apply to affected areas of buttocks, sacrum, and genitals twice daily. May also apply as needed for each incontinence episode   Yes [provider]  spironolactone (ALDACTONE) 25 MG tablet Take 0.5 tablets (12.5 mg total) by mouth daily. 03/06/22  Yes Rai, Ripudeep K, MD  vitamin B-12 (CYANOCOBALAMIN) 1000 MCG tablet Take 2,000 mcg by mouth daily.   Yes [provider]  UNABLE TO FIND Take 1 Tube by mouth as needed (BS less than 60 or signs of hypoglycemia). Med Name: glucose gel    [provider]   Allergies  Allergen Reactions   Penicillins Other (See Comments)    Reaction is not listed on MAR   Review of Systems Does not verbalize Physical Exam Resting in bed with eyes closed Awakens when name is called Appears with generalized weakness Regular work of breathing Abdomen mildly distended Trace edema  Vital Signs: BP (!) 146/87 (BP Location: Right Arm)   Pulse 64   Temp (!) 97.5 F (36.4 C) (Oral)   Resp 19   Ht 5\' 7"  (1.702 m)   Wt 61.9 kg   SpO2 96%   BMI 21.37 kg/m  Pain Scale: 0-10   Pain Score: 0-No pain   SpO2: SpO2: 96 % O2 Device:SpO2: 96 % O2 Flow Rate: .   IO: Intake/output summary:  Intake/Output Summary (Last 24 hours) at 03/10/2022 1332 Last data filed at 03/10/2022 0600 Gross per 24 hour  Intake 240 ml  Output 900 ml  Net -660 ml    LBM: Last BM Date : 03/10/22 Baseline Weight: Weight: 61.9 kg Most recent weight: Weight: 61.9 kg     Palliative Assessment/Data:   Palliative performance scale 30%  Time In: 12.30 Time Out: 1330 Time Total: 60 Greater than 50%  of this time was spent counseling and coordinating care related to the above assessment  and plan.  Signed by: Loistine Chance, MD   Please contact Palliative Medicine Team phone at [phone] for questions and concerns.  For individual provider: See Shea Evans

## 2022-03-10 NOTE — Progress Notes (Signed)
Progress Note   Patient: Nathan Dayhoff [name] OIN:[id number] DOB: [birth year] DOA: 03/06/2022     3 DOS: the patient was seen and examined on 03/10/2022   Brief hospital course: 88 year old male with past medical history of  DM2, GERD, HTN, HLD, SSS s/p PPM.  He was admitted here 11/22 to 11/29, discharged yesterday.  He was admitted with a diagnosis of sepsis from multifocal pneumonia, and colitis.  He was discharged to the nursing home on antibiotics cefdinir and Flagyl.  Today he was noted to have decreased mentation with decreased oral intake and activity.  This progress as the evening wore on.  Fingerstick blood sugar check was 44.  911 was called, he was transported to the ER.     In-route and ambulance he was given to D10, by time he arrived in the ER his mentation had improved.  His fingerstick blood sugar was 180.  He had a rectal temperature of 94.9.  A Bair hugger was placed on him.  Hospitalist asked to admit.   Assessment and Plan: Present on Admission:  Hypoglycemia associated with type 2 diabetes mellitus ([place]) -lantus now on hold -glycemic trends now stable, have d/c'd d5 fluids -Will liberalize diet given family concerns of patient's failure to thrive, see below -glucose trends seem stable currently -Plan SSI coverage as needed moving forward   Hypothermia -resolved -likely secondary to presenting hypoglycemia that has resolved    Ileus seen on imaging -now tolerating diet   Recovering from multi focal pneumonia -Antibiotics completed on 03/08/22 -on minimal O2 support  UTI  -Urinalysis suggestive of UTI -only some yeast in urine cx, otherwise neg -to completed course of cefepime    Hypertension -Patient had a brief episode of hypotension, was positive to IV fluid bolus. -continued coreg  Failure to thrive -Met with family at bedside. Great concern of pt's gradual decline overtime and very poor PO intake -Pt has prior hx of esophageal strictures, however family  wishes to hold off on any aggressive measures, including esephageal dilation -Pt's son reports that pt will attempt to eat in hospital, however when left on his own, will not eat, leading to hypoglycemic attacks and failure to thrive -Family reports decline began after the death of his wife -Pt currently DNR. Family in agreement to discuss further with Palliative Care. -Appreciate Palliative Care input. Also did receive message from facility provider. Plan for transfer back to facility with hospice services      Subjective: Without complaints today. Claims to be working on his breakfast when seen. Staff reports pt is not eating much  Physical Exam: Vitals:   03/09/22 1249 03/09/22 2148 03/10/22 0514 03/10/22 1205  BP: (!) 143/79 (!) 148/75 (!) 174/80 (!) 146/87  Pulse: 71 72 63 64  Resp: _0 Temp: (!) 97.5 F (36.4 C) 98 F (36.7 C) 98 F (36.7 C) (!) 97.5 F (36.4 C)  TempSrc: Oral Oral Oral Oral  SpO2: 97% 96% 96% 96%  Weight:      Height:       General exam: Conversant, in no acute distress Respiratory system: normal chest rise, clear, no audible wheezing Cardiovascular system: regular rhythm, s1-s2 Gastrointestinal system: Nondistended, nontender, pos BS Central nervous system: No seizures, no tremors Extremities: No cyanosis, no joint deformities Skin: No rashes, no pallor Psychiatry: Affect normal // no auditory hallucinations   Data Reviewed:  Labs reviewed: Na 140, Cr 0.94, WBC 9.4  Family Communication: Pt in room, family not at bedside  Disposition: Status is: Inpatient Remains inpatient appropriate because: Severity of illness  Planned Discharge Destination: Skilled nursing facility     Author: Marylu Lund, MD 03/10/2022 5:29 PM  For on call review [email or web address].

## 2022-03-10 NOTE — TOC Initial Note (Signed)
Transition of Care (TOC) - Initial/Assessment Note    Patient Details  Name: Nathan Eskew [name] MRN: [id number] Date of Birth: 1924/12/27  Transition of Care [facility]) CM/SW Contact:    Otelia Santee, LCSW Phone Number: 03/10/2022, 9:58 AM  Clinical Narrative:                 Pt currently resides at [facility]. Spoke with pt's daughter in law over the phone who shares that they have had some conversations with [facility] but, have not yet began services as they have more questions/need more information about Hospice. Palliative care has been consulted and TOC will continue to follow for recommendations.   Expected Discharge Plan: Assisted Living Barriers to Discharge: Continued Medical Work up   Patient Goals and CMS Choice Patient states their goals for this hospitalization and ongoing recovery are:: For pt to return to [facility] CMS Medicare.gov Compare Post Acute Care list provided to:: Patient Represenative (must comment) (Son/DIL) Choice offered to / list presented to : Adult Children, HC POA / Guardian  Expected Discharge Plan and Services Expected Discharge Plan: Assisted Living In-house Referral: Hospice / Palliative Care Discharge Planning Services: CM Consult Post Acute Care Choice: Hospice Living arrangements for the past 2 months: Assisted Living Facility                 DME Arranged: N/A DME Agency: NA                  Prior Living Arrangements/Services Living arrangements for the past 2 months: Assisted Living Facility Lives with:: Facility Resident Patient language and need for interpreter reviewed:: Yes Do you feel safe going back to the place where you live?: Yes      Need for Family Participation in Patient Care: Yes (Comment) Care giver support system in place?: Yes (comment) Current home services: DME, Home OT, Home PT (RW;3in1) Criminal Activity/Legal Involvement Pertinent to Current Situation/Hospitalization: No - Comment as  needed  Activities of Daily Living Home Assistive Devices/Equipment: None ADL Screening (condition at time of admission) Patient's cognitive ability adequate to safely complete daily activities?: No Is the patient deaf or have difficulty hearing?: Yes Does the patient have difficulty seeing, even when wearing glasses/contacts?: No Does the patient have difficulty concentrating, remembering, or making decisions?: No Patient able to express need for assistance with ADLs?: Yes Does the patient have difficulty dressing or bathing?: Yes Independently performs ADLs?: No Does the patient have difficulty walking or climbing stairs?: Yes Weakness of Legs: Both Weakness of Arms/Hands: None  Permission Sought/Granted Permission sought to share information with : Facility Medical sales representative, Family Supports Permission granted to share information with : Yes, Verbal Permission Granted  Share Information with NAME: Nathan Alvarez  Permission granted to share info w AGENCY: [facility] and [facility]  Permission granted to share info w Relationship: Daughter in [place]  Permission granted to share info w Contact Information: [phone]  Emotional Assessment Appearance::  (UTA) Attitude/Demeanor/Rapport: Unable to Assess Affect (typically observed): Unable to Assess Orientation: : Oriented to Self, Oriented to Place Alcohol / Substance Use: Not Applicable Psych Involvement: No (comment)  Admission diagnosis:  Hypoglycemia [E16.2] SIRS (systemic inflammatory response syndrome) (HCC) [R65.10] AKI (acute kidney injury) (HCC) [N17.9] Hypothermia, initial encounter [T68.XXXA] Hypoglycemia associated with type 2 diabetes mellitus (HCC) [E11.649] Multifocal pneumonia [J18.9] Patient Active Problem List   Diagnosis Date Noted   Hypoglycemia associated with type 2 diabetes mellitus (HCC) 03/07/2022   Hypoglycemia 03/07/2022  Sepsis (HCC) 02/26/2022   Elevated troponin 02/26/2022   GERD  (gastroesophageal reflux disease) 02/26/2022   Dysphagia    Abnormal barium swallow    Acute systolic heart failure (HCC)    Influenza A 03/17/2021   Hypotension 03/17/2021   Septic shock (HCC)    Acute respiratory failure with hypoxia (HCC)    Lactic acidosis    Weakness generalized    Multifocal pneumonia 01/21/2021   Pressure injury of skin 01/21/2021   Fall    Aspiration pneumonia (HCC) 12/15/2020   AKI (acute kidney injury) (HCC) 12/14/2020   Colitis 12/14/2020   Sinus node dysfunction (HCC) 03/06/2020   Pacemaker 03/06/2020   Benign prostatic hyperplasia with nocturia 05/23/2019   Vitamin D deficiency 04/25/2019   Pathological fracture of left hip due to age-related osteoporosis (HCC) 04/25/2019   Femoral neck fracture (HCC) 04/21/2019   Hypertension 04/21/2019   Diabetes (HCC) 04/21/2019   Hyponatremia 04/21/2019   Anemia 04/21/2019   Hypertension, essential, benign 07/13/2017   PCP:  Sunday Spillers, NP Pharmacy:   [facility], [place] - [address] [place] [address] Phone: [phone] Fax: [phone]  [facility] [address] [place] [address] Phone: [phone] Fax: [phone]     Social Determinants of Health (SDOH) Interventions    Readmission Risk Interventions    03/10/2022    9:54 AM 03/05/2022    1:21 PM 03/03/2022   11:51 AM  Readmission Risk Prevention Plan  Transportation Screening Complete Complete Complete  PCP or Specialist Appt within 5-7 Days   Complete  PCP or Specialist Appt within 3-5 Days Complete Complete   Home Care Screening   Complete  Medication Review (RN CM)   Complete  HRI or Home Care Consult Complete Complete   Social Work Consult for Recovery Care Planning/Counseling Complete Complete   Palliative Care Screening Complete Not Applicable   Medication Review Oceanographer) Complete Complete

## 2022-03-10 NOTE — Progress Notes (Signed)
Johann Capers, NP notified of patient's BP. Scheduled coreg given earlier.    03/10/22 0514  Vitals  Temp 98 F (36.7 C)  Temp Source Oral  BP (!) 174/80  MAP (mmHg) 104  BP Location Left Arm  BP Method Automatic  Patient Position (if appropriate) Lying  Pulse Rate 63  Pulse Rate Source Monitor  Level of Consciousness  Level of Consciousness Alert  MEWS COLOR  MEWS Score Color Green  Oxygen Therapy  SpO2 96 %  O2 Device Room Air

## 2022-03-11 LAB — GLUCOSE, CAPILLARY
Glucose-Capillary: 86 mg/dL (ref 70–99)
Glucose-Capillary: 169 mg/dL — ABNORMAL HIGH (ref 70–99)

## 2022-03-11 MED ORDER — INSULIN LISPRO (1 UNIT DIAL) 100 UNIT/ML (KWIKPEN): PEN_INJECTOR | SUBCUTANEOUS | 11 refills | Status: AC

## 2022-03-11 MED ORDER — INSULIN PEN NEEDLE 31G X 5 MM MISC: 1.0000 | Freq: Three times a day (TID) | 0 refills | Status: AC | PRN

## 2022-03-11 NOTE — TOC Initial Note (Addendum)
Transition of Care (TOC) - Initial/Assessment Note    Patient Details  Name: Nathan Letizia [name] MRN: [id number] Date of Birth: 06/05/1924  Transition of Care [facility]) CM/SW Contact:    Coralyn Helling, LCSW Phone Number: 03/11/2022, 12:15 PM  Clinical Narrative: Patient to return to [facility] with Hospice provided by [facility]. DC docs faxed to facility. Patient return confirmed with Tobi Bastos. Patient to return via [facility]. [facility] notified or patient return. Son, Jahmir notified of return. Patient packet on chart. RN number for report [phone]   Expected Discharge Plan: Assisted Living Barriers to Discharge: Continued Medical Work up   Patient Goals and CMS Choice Patient states their goals for this hospitalization and ongoing recovery are:: For pt to return to [facility] CMS Medicare.gov Compare Post Acute Care list provided to:: Patient Represenative (must comment) (Son/DIL) Choice offered to / list presented to : Adult Children, HC POA / Guardian  Expected Discharge Plan and Services Expected Discharge Plan: Assisted Living In-house Referral: Hospice / Palliative Care Discharge Planning Services: CM Consult Post Acute Care Choice: Hospice Living arrangements for the past 2 months: Assisted Living Facility Expected Discharge Date: 03/11/22               DME Arranged: N/A DME Agency: NA                  Prior Living Arrangements/Services Living arrangements for the past 2 months: Assisted Living Facility Lives with:: Facility Resident Patient language and need for interpreter reviewed:: Yes Do you feel safe going back to the place where you live?: Yes      Need for Family Participation in Patient Care: Yes (Comment) Care giver support system in place?: Yes (comment) Current home services: DME, Home OT, Home PT (RW;3in1) Criminal Activity/Legal Involvement Pertinent to Current Situation/Hospitalization: No - Comment as needed  Activities of Daily Living Home  Assistive Devices/Equipment: None ADL Screening (condition at time of admission) Patient's cognitive ability adequate to safely complete daily activities?: No Is the patient deaf or have difficulty hearing?: Yes Does the patient have difficulty seeing, even when wearing glasses/contacts?: No Does the patient have difficulty concentrating, remembering, or making decisions?: No Patient able to express need for assistance with ADLs?: Yes Does the patient have difficulty dressing or bathing?: Yes Independently performs ADLs?: No Does the patient have difficulty walking or climbing stairs?: Yes Weakness of Legs: Both Weakness of Arms/Hands: None  Permission Sought/Granted Permission sought to share information with : Facility Medical sales representative, Family Supports Permission granted to share information with : Yes, Verbal Permission Granted  Share Information with NAME: Sherrill Mckamie  Permission granted to share info w AGENCY: [facility] and [facility]  Permission granted to share info w Relationship: Daughter in [place]  Permission granted to share info w Contact Information: [phone]  Emotional Assessment Appearance::  (UTA) Attitude/Demeanor/Rapport: Unable to Assess Affect (typically observed): Unable to Assess Orientation: : Oriented to Self, Oriented to Place Alcohol / Substance Use: Not Applicable Psych Involvement: No (comment)  Admission diagnosis:  Hypoglycemia [E16.2] SIRS (systemic inflammatory response syndrome) (HCC) [R65.10] AKI (acute kidney injury) (HCC) [N17.9] Hypothermia, initial encounter [T68.XXXA] Hypoglycemia associated with type 2 diabetes mellitus (HCC) [E11.649] Multifocal pneumonia [J18.9] Patient Active Problem List   Diagnosis Date Noted   Hypoglycemia associated with type 2 diabetes mellitus (HCC) 03/07/2022   Hypoglycemia 03/07/2022   Sepsis (HCC) 02/26/2022   Elevated troponin 02/26/2022   GERD (gastroesophageal reflux disease)  02/26/2022   Dysphagia    Abnormal  barium swallow    Acute systolic heart failure (HCC)    Influenza A 03/17/2021   Hypotension 03/17/2021   Septic shock (HCC)    Acute respiratory failure with hypoxia (HCC)    Lactic acidosis    Weakness generalized    Multifocal pneumonia 01/21/2021   Pressure injury of skin 01/21/2021   Fall    Aspiration pneumonia (HCC) 12/15/2020   AKI (acute kidney injury) (HCC) 12/14/2020   Colitis 12/14/2020   Sinus node dysfunction (HCC) 03/06/2020   Pacemaker 03/06/2020   Benign prostatic hyperplasia with nocturia 05/23/2019   Vitamin D deficiency 04/25/2019   Pathological fracture of left hip due to age-related osteoporosis (HCC) 04/25/2019   Femoral neck fracture (HCC) 04/21/2019   Hypertension 04/21/2019   Diabetes (HCC) 04/21/2019   Hyponatremia 04/21/2019   Anemia 04/21/2019   Hypertension, essential, benign 07/13/2017   PCP:  Sunday Spillers, NP Pharmacy:   [facility], [place] - [address] [place] [address] Phone: [phone] Fax: [phone]  [facility] [address] [place] [address] Phone: [phone] Fax: [phone]     Social Determinants of Health (SDOH) Interventions    Readmission Risk Interventions    03/10/2022    9:54 AM 03/05/2022    1:21 PM 03/03/2022   11:51 AM  Readmission Risk Prevention Plan  Transportation Screening Complete Complete Complete  PCP or Specialist Appt within 5-7 Days   Complete  PCP or Specialist Appt within 3-5 Days Complete Complete   Home Care Screening   Complete  Medication Review (RN CM)   Complete  HRI or Home Care Consult Complete Complete   Social Work Consult for Recovery Care Planning/Counseling Complete Complete   Palliative Care Screening Complete Not Applicable   Medication Review Oceanographer) Complete Complete

## 2022-03-11 NOTE — Care Management Important Message (Signed)
Important Message  Patient Details IM Letter given Name: Nathan Gura [name] MRN: [id number] Date of Birth: 1924/08/18   Medicare Important Message Given:  Yes     [name] 03/11/2022, 11:21 AM

## 2022-03-11 NOTE — Discharge Summary (Signed)
Physician Discharge Summary   Patient: Nathan Kelter [name] MRN: [id number] DOB: Sep 30, 1924  Admit date:     03/06/2022  Discharge date: 03/11/22  Discharge Physician: Marylu Lund   PCP: Jacelyn Pi, NP   Recommendations at discharge:    Follow up with PCP as needed Follow up with hospice services   Discharge Diagnoses: Principal Problem:   Hypoglycemia associated with type 2 diabetes mellitus ([place]) Active Problems:   Hypertension   Diabetes ([place])   Pacemaker   Multifocal pneumonia   Hypertension, essential, benign   Hypotension   Hypoglycemia  Resolved Problems:   * No resolved hospital problems. *  Hospital Course: 88 year old male with past medical history of  DM2, GERD, HTN, HLD, SSS s/p PPM.  He was admitted here 11/22 to 11/29, discharged yesterday.  He was admitted with a diagnosis of sepsis from multifocal pneumonia, and colitis.  He was discharged to the nursing home on antibiotics cefdinir and Flagyl.  Today he was noted to have decreased mentation with decreased oral intake and activity.  This progress as the evening wore on.  Fingerstick blood sugar check was 44.  911 was called, he was transported to the ER.     In-route and ambulance he was given to D10, by time he arrived in the ER his mentation had improved.  His fingerstick blood sugar was 180.  He had a rectal temperature of 94.9.  A Bair hugger was placed on him.  Hospitalist asked to admit.   Assessment and Plan: Present on Admission:  Hypoglycemia associated with type 2 diabetes mellitus ([place]) -lantus now on hold -glycemic trends now stable, off all insulin. Was continued on SSI alone, however pt did not require any doses this visit -Will liberalize diet given family concerns of patient's failure to thrive, see below -glucose trends seem stable currently -Plan SSI coverage as needed moving forward   Hypothermia -resolved -likely secondary to presenting hypoglycemia that has resolved    Ileus  seen on imaging -now tolerating diet   Recovering from multi focal pneumonia -Antibiotics completed on 03/08/22 -on minimal O2 support   UTI  -Urinalysis suggestive of UTI -only some yeast in urine cx, otherwise neg -to completed course of cefepime    Hypertension -Patient had a brief episode of hypotension, was positive to IV fluid bolus. -continued coreg   Failure to thrive -Met with family at bedside. Great concern of pt's gradual decline overtime and very poor PO intake -Pt has prior hx of esophageal strictures, however family wishes to hold off on any aggressive measures, including esephageal dilation -Pt's son reports that pt will attempt to eat in hospital, however when left on his own, will not eat, leading to hypoglycemic attacks and failure to thrive -Family reports decline began after the death of his wife -Pt currently DNR. Family in agreement to discuss further with Palliative Care. -Appreciate Palliative Care input. Also did receive message from facility provider. Plan for transfer back to facility with hospice services       Consultants: Palliative Care Procedures performed:   Disposition: Skilled nursing facility with hospice Diet recommendation:  Regular diet DISCHARGE MEDICATION: Allergies as of 03/11/2022       Reactions   Penicillins Other (See Comments)   Reaction is not listed on MAR        Medication List     STOP taking these medications    cefdinir 300 MG capsule Commonly known as: OMNICEF   Levemir FlexTouch 100 UNIT/ML FlexPen  Generic drug: insulin detemir   metroNIDAZOLE 500 MG tablet Commonly known as: FLAGYL   UNABLE TO FIND       TAKE these medications    acetaminophen 325 MG tablet Commonly known as: TYLENOL Take 2 tablets (650 mg total) by mouth every 6 (six) hours as needed for mild pain (or Fever >/= 101). What changed: reasons to take this   aspirin EC 81 MG tablet Take 1 tablet (81 mg total) by mouth daily with  breakfast. Swallow whole.   BAZA PROTECT EX Apply 1 application  topically See admin instructions. Apply to affected areas of buttocks, sacrum, and genitals twice daily. May also apply as needed for each incontinence episode   Calcium Carbonate 500 MG Chew Chew 500 mg by mouth in the morning and at bedtime.   carvedilol 3.125 MG tablet Commonly known as: COREG Take 3.125 mg by mouth 2 (two) times daily with a meal.   cyanocobalamin 1000 MCG tablet Commonly known as: VITAMIN B12 Take 2,000 mcg by mouth daily.   DUODERM CGF DRESSING EX Apply 1 Application topically once a week. Apply to sacrum wound. May also change as needed when dressing is soiled.   FeroSul 325 (65 FE) MG tablet Generic drug: ferrous sulfate Take 325 mg by mouth daily.   fluticasone 50 MCG/ACT nasal spray Commonly known as: FLONASE Place 2 sprays into both nostrils daily.   guaiFENesin 600 MG 12 hr tablet Commonly known as: MUCINEX Take 2 tablets (1,200 mg total) by mouth 2 (two) times daily.   insulin lispro 100 UNIT/ML KwikPen Commonly known as: HumaLOG KwikPen Sliding Scale CBG < 70: Implement Hypoglycemia Standing Orders CBG 70 - 120: 0 units  CBG 121 - 150: 1 unit  CBG 151 - 200: 2 units  CBG 201 - 250: 3 units  CBG 251 - 300: 5 units  CBG 301 - 350: 7 units  CBG 351 - 400: 9 units  CBG > 400: Please notify facility MD What changed:  how much to take how to take this when to take this reasons to take this additional instructions   Insulin Pen Needle 31G X 5 MM Misc 1 Device by Does not apply route 3 (three) times daily between meals as needed. For use with insulin pens   levalbuterol 45 MCG/ACT inhaler Commonly known as: XOPENEX HFA Inhale 1 puff into the lungs every 4 (four) hours as needed for wheezing.   loratadine 10 MG tablet Commonly known as: CLARITIN Take 1 tablet (10 mg total) by mouth daily.   losartan 50 MG tablet Commonly known as: COZAAR Take 1 tablet (50 mg total) by  mouth daily.   nystatin ointment Commonly known as: MYCOSTATIN Apply 1 Application topically 2 (two) times daily. To groin area   pantoprazole 40 MG tablet Commonly known as: PROTONIX Take 1 tablet (40 mg total) by mouth daily.   PARoxetine 10 MG tablet Commonly known as: PAXIL Take 10 mg by mouth daily.   spironolactone 25 MG tablet Commonly known as: ALDACTONE Take 0.5 tablets (12.5 mg total) by mouth daily.   Vitamin D3 50 MCG (2000 UT) Tabs Take 2,000 Units by mouth daily.        Follow-up Information     Benedict Needy Thu T, NP Follow up.   Specialty: Nurse Practitioner Why: Hospital follow up, As needed Contact information: [address] [place] [address] [phone]         follow up with hospice services Follow up.  Why: Hospital follow up               Discharge Exam: Filed Weights   03/07/22 1938  Weight: 61.9 kg   General exam: Awake, laying in bed, in nad Respiratory system: Normal respiratory effort, no wheezing Cardiovascular system: regular rate, s1, s2 Gastrointestinal system: Soft, nondistended, positive BS Central nervous system: CN2-12 grossly intact, strength intact Extremities: Perfused, no clubbing Skin: Normal skin turgor, no notable skin lesions seen Psychiatry: Mood normal // no visual hallucinations   Condition at discharge: fair  The results of significant diagnostics from this hospitalization (including imaging, microbiology, ancillary and laboratory) are listed below for reference.   Imaging Studies: DG Abd Portable 1V  Result Date: 03/06/2022 CLINICAL DATA:  Possible sepsis EXAM: PORTABLE ABDOMEN - 1 VIEW COMPARISON:  None Available. FINDINGS: Scattered large and small bowel gas is noted. No free air is seen. Dilatation of the small bowel is noted. These changes may represent an ileus although the possibility of small-bowel obstruction deserves consideration. Correlation with the physical exam is  recommended. IMPRESSION: Small-bowel dilatation and gaseous distension of the colon. This may represent a generalized ileus. Correlate with the physical exam. Electronically Signed   By: Inez Catalina M.D.   On: 03/06/2022 23:20   DG Chest Port 1 View  Result Date: 03/06/2022 CLINICAL DATA:  Weakness EXAM: PORTABLE CHEST 1 VIEW COMPARISON:  02/27/2022 FINDINGS: Cardiac shadow is mildly prominent. Pacing device is again seen. Lungs are well aerated bilaterally. No focal infiltrate or effusion is seen. No bony abnormality is noted. Scattered bowel gas is noted throughout the colon without definitive obstruction. IMPRESSION: No active disease. Electronically Signed   By: Inez Catalina M.D.   On: 03/06/2022 23:17   DG CHEST PORT 1 VIEW  Result Date: 02/27/2022 CLINICAL DATA:  SOB EXAM: PORTABLE CHEST 1 VIEW COMPARISON:  02/26/2022 FINDINGS: Left-sided pacemaker. Cardiac silhouette may be Enlarged. Dependent subsegmental atelectasis with linear densities or scarring. Unremarkable pulmonary vasculature. No pneumothorax. Calcified aorta. IMPRESSION: Subsegmental atelectasis or scarring. Prominent cardiac silhouette. Electronically Signed   By: Sammie Bench M.D.   On: 02/27/2022 12:48   [place] EKG SITE RITE  Result Date: 02/27/2022 If Site Rite image not attached, placement could not be confirmed due to current cardiac rhythm.  ECHOCARDIOGRAM COMPLETE  Result Date: 02/26/2022    ECHOCARDIOGRAM REPORT   Patient Name:   Darrell Hauk [name] Date of Exam: 02/26/2022 Medical Rec #:  [id number]          Height:       67.0 in Accession #:    [id number]         Weight:       127.9 lb Date of Birth:  05-23-1924         BSA:          1.672 m Patient Age:    89 years           BP:           105/66 mmHg Patient Gender: M                  HR:           80 bpm. Exam Location:  Inpatient Procedure: 2D Echo and Intracardiac Opacification Agent Indications:    elevated troponin  History:        Patient has prior history of  Echocardiogram examinations, most  recent 03/20/2021. Pacemaker; Risk Factors:Diabetes and                 Hypertension.  Sonographer:    Harvie Junior Referring Phys: [id number] Jonnie Finner  Sonographer Comments: Technically difficult study due to poor echo windows, echo performed with patient supine and on artificial respirator and no subcostal window. IMPRESSIONS  1. Left ventricular ejection fraction, by estimation, is 30 to 35%. The left ventricle has moderately decreased function. The left ventricle demonstrates global hypokinesis. There is severe asymmetric left ventricular hypertrophy of the basal-septal segment. Left ventricular diastolic parameters are consistent with Grade I diastolic dysfunction (impaired relaxation).  2. Right ventricular systolic function is normal. The right ventricular size is mildly enlarged. There is mildly elevated pulmonary artery systolic pressure.  3. The mitral valve is grossly normal. No evidence of mitral valve regurgitation. No evidence of mitral stenosis.  4. The aortic valve was not well visualized. Aortic valve regurgitation is not visualized. Comparison(s): Prior images reviewed side by side. Similar LVEF- this study is more technically difficult that prior. FINDINGS  Left Ventricle: Left ventricular ejection fraction, by estimation, is 30 to 35%. The left ventricle has moderately decreased function. The left ventricle demonstrates global hypokinesis. Definity contrast agent was given IV to delineate the left ventricular endocardial borders. The left ventricular internal cavity size was normal in size. There is severe asymmetric left ventricular hypertrophy of the basal-septal segment. Left ventricular diastolic parameters are consistent with Grade I diastolic dysfunction (impaired relaxation). Right Ventricle: The right ventricular size is mildly enlarged. No increase in right ventricular wall thickness. Right ventricular systolic function is normal. There  is mildly elevated pulmonary artery systolic pressure. The tricuspid regurgitant velocity is 2.92 m/s, and with an assumed right atrial pressure of 3 mmHg, the estimated right ventricular systolic pressure is 89.2 mmHg. Left Atrium: Left atrial size was normal in size. Right Atrium: Right atrial size was normal in size. Pericardium: There is no evidence of pericardial effusion. Presence of epicardial fat layer. Mitral Valve: The mitral valve is grossly normal. No evidence of mitral valve regurgitation. No evidence of mitral valve stenosis. Tricuspid Valve: The tricuspid valve is normal in structure. Tricuspid valve regurgitation is not demonstrated. No evidence of tricuspid stenosis. Aortic Valve: The aortic valve was not well visualized. Aortic valve regurgitation is not visualized. Aortic regurgitation PHT measures 767 msec. Aortic valve mean gradient measures 5.0 mmHg. Aortic valve peak gradient measures 8.6 mmHg. Pulmonic Valve: The pulmonic valve was not well visualized. Pulmonic valve regurgitation is not visualized. No evidence of pulmonic stenosis. Aorta: The aortic root is normal in size and structure. IAS/Shunts: No atrial level shunt detected by color flow Doppler.  LEFT VENTRICLE PLAX 2D LVIDd:         4.40 cm      Diastology LVIDs:         3.50 cm      LV e' medial:    4.79 cm/s LV PW:         1.00 cm      LV E/e' medial:  7.2 LV IVS:        1.00 cm      LV e' lateral:   6.64 cm/s                             LV E/e' lateral: 5.2  LV Volumes (MOD) LV vol d, MOD A2C: 102.0 ml LV vol d, MOD A4C:  81.1 ml LV vol s, MOD A2C: 61.2 ml LV vol s, MOD A4C: 60.6 ml LV SV MOD A2C:     40.8 ml LV SV MOD A4C:     81.1 ml LV SV MOD BP:      30.5 ml RIGHT VENTRICLE RV Basal diam:  4.40 cm RV Mid diam:    3.90 cm TAPSE (M-mode): 2.0 cm LEFT ATRIUM             Index        RIGHT ATRIUM           Index LA diam:        3.70 cm 2.21 cm/m   RA Area:     15.40 cm LA Vol (A2C):   47.8 ml 28.59 ml/m  RA Volume:   41.00 ml   24.52 ml/m LA Vol (A4C):   31.5 ml 18.84 ml/m LA Biplane Vol: 37.4 ml 22.37 ml/m  AORTIC VALVE                    PULMONIC VALVE AV Vmax:           147.00 cm/s  PV Vmax:       0.77 m/s AV Vmean:          101.000 cm/s PV Peak grad:  2.4 mmHg AV VTI:            0.229 m AV Peak Grad:      8.6 mmHg AV Mean Grad:      5.0 mmHg LVOT Vmax:         88.80 cm/s LVOT Vmean:        63.200 cm/s LVOT VTI:          0.159 m LVOT/AV VTI ratio: 0.69 AI PHT:            767 msec MITRAL VALVE               TRICUSPID VALVE MV Area (PHT): 2.34 cm    TR Peak grad:   34.1 mmHg MV Decel Time: 324 msec    TR Vmax:        292.00 cm/s MR Peak grad: 50.3 mmHg MR Vmax:      354.67 cm/s  SHUNTS MV E velocity: 34.70 cm/s  Systemic VTI: 0.16 m MV A velocity: 75.80 cm/s MV E/A ratio:  0.46 Rudean Haskell MD Electronically signed by Rudean Haskell MD Signature Date/Time: 02/26/2022/4:27:14 PM    Final    CT Angio Chest PE W and/or Wo Contrast  Result Date: 02/26/2022 CLINICAL DATA:  Sepsis. Shortness of breath. Recent antibiotic therapy for pneumonia. Hypoxia. EXAM: CT ANGIOGRAPHY CHEST CT ABDOMEN AND PELVIS WITH CONTRAST TECHNIQUE: Multidetector CT imaging of the chest was performed using the standard protocol during bolus administration of intravenous contrast. Multiplanar CT image reconstructions and MIPs were obtained to evaluate the vascular anatomy. Multidetector CT imaging of the abdomen and pelvis was performed using the standard protocol during bolus administration of intravenous contrast. RADIATION DOSE REDUCTION: This exam was performed according to the departmental dose-optimization program which includes automated exposure control, adjustment of the mA and/or kV according to patient size and/or use of iterative reconstruction technique. CONTRAST:  26m OMNIPAQUE IOHEXOL 350 MG/ML SOLN COMPARISON:  Chest radiograph 02/26/2022 and CT abdomen 12/14/2020 FINDINGS: Despite efforts by the technologist and patient, motion  artifact is present on today's exam and could not be eliminated. This reduces exam sensitivity and specificity. CTA CHEST FINDINGS Cardiovascular: No filling defect is  identified in the pulmonary arterial tree to suggest pulmonary embolus. Reduced sensitivity due to motion artifact. Coronary, aortic arch, and branch vessel atherosclerotic vascular disease. Mild cardiomegaly. Dual lead pacer noted. Mediastinum/Nodes: 1.7 cm hypodense nodule in the right thyroid lobe on image 17 series 2. In the setting of significant comorbidities or limited life expectancy, no follow-up recommended (ref: J Am Coll Radiol. 2015 Feb;12(2): 143-50).No pathologic adenopathy. Lungs/Pleura: Airspace opacities in both lower lobes suspicious for multilobar pneumonia or aspiration pneumonitis. There is some faint airspace opacity peripherally in the right upper lobe on image 47 series 4. Musculoskeletal: Degenerative sternoclavicular arthropathy with some posterior subluxation of the right clavicle along the sternoclavicular joint as shown on image 37 series 4. Thoracic spondylosis. Review of the MIP images confirms the above findings. CT ABDOMEN and PELVIS FINDINGS Hepatobiliary: Unremarkable Pancreas: Atrophic pancreas with small scattered cystic lesions or cystic dilatation of the dorsal pancreatic duct. The largest such lesion is in the pancreatic head and measures 1.2 by 1.2 cm on image 34 series 4, stable based on my measurements compared to 12/14/2020. No progression or change since the prior exam. Spleen: Unremarkable Adrenals/Urinary Tract: Both adrenal glands appear normal. Bosniak category 1 cyst of the left kidney upper pole is benign and warrants no further workup. Urinary bladder and left distal ureter partially obscured by streak artifact from the left hip implant. Stomach/Bowel: Distended rectum and sigmoid colon, with mild associated wall thickening potentially reflecting distal colitis. The degree of wall thickening is less  striking than on the 12/14/2020 exam although the distal colon is substantially more distended with frothy material down on that exam. Along the proximal margin of the dilated segment the proximal sigmoid colon demonstrates some twisting/swirling as on images 33 through 42 of series 6, along the margin of a diverticulum, but without luminal occlusion/obstruction or other significant indicator of a fulcrum for volvulus. Accordingly I favor a functional abnormality and/or distal colitis over sigmoid volvulus. 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 There is also mild wall calcification in the proximal SMA. No pathologic adenopathy. Reproductive: Prostate gland partially obscured by streak artifact from the patient's left hip implant. Other: No substantial ascites.  Trace perirectal stranding. Musculoskeletal: Left hip hemiarthroplasty. Interdigitating articulation and likely partial fusion at L4-5 and likely partial fusion at L5-S1. Left foraminal impingement at L4-5 due to intervertebral and facet spurring. Probably fused facet joints at L4-5. Mild left foraminal stenosis at L3-4 due to facet arthropathy. Review of the MIP images confirms the above findings. IMPRESSION: 1. No filling defect is identified in the pulmonary arterial tree to suggest pulmonary embolus. Reduced sensitivity due to motion artifact. 2. Airspace opacities in both lower lobes suspicious for multilobar pneumonia or aspiration pneumonitis. There is also some faint airspace opacity peripherally in the right upper lobe. 3. Distended rectum and sigmoid colon, with mild associated wall thickening potentially reflecting distal colitis. The degree of wall thickening is less striking than on the 12/14/2020 exam. There is some twisting/swirling along the proximal margin of the dilated segment of the proximal sigmoid colon, but without luminal occlusion/obstruction or other significant indicator of a  fulcrum for volvulus. Accordingly I favor a functional abnormality and/or distal colitis over sigmoid volvulus. 4. Atrophic pancreas with small scattered cystic lesions or cystic dilatation of the dorsal pancreatic duct. The largest such lesion is in the pancreatic head and measures 1.2 by 1.2 cm. No progression or change since the prior exam. Given the patient's age, follow up pancreatic protocol  CT or MRI is recommended in 2 years time. 5. 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 Left foraminal impingement at L4-5 due to intervertebral and facet spurring. 6. Aortic and coronary atherosclerosis. Aortic Atherosclerosis (ICD10-I70.0). Electronically Signed   By: Van Clines M.D.   On: 02/26/2022 11:38   CT ABDOMEN PELVIS W CONTRAST  Result Date: 02/26/2022 CLINICAL DATA:  Sepsis. Shortness of breath. Recent antibiotic therapy for pneumonia. Hypoxia. EXAM: CT ANGIOGRAPHY CHEST CT ABDOMEN AND PELVIS WITH CONTRAST TECHNIQUE: Multidetector CT imaging of the chest was performed using the standard protocol during bolus administration of intravenous contrast. Multiplanar CT image reconstructions and MIPs were obtained to evaluate the vascular anatomy. Multidetector CT imaging of the abdomen and pelvis was performed using the standard protocol during bolus administration of intravenous contrast. RADIATION DOSE REDUCTION: This exam was performed according to the departmental dose-optimization program which includes automated exposure control, adjustment of the mA and/or kV according to patient size and/or use of iterative reconstruction technique. CONTRAST:  71m OMNIPAQUE IOHEXOL 350 MG/ML SOLN COMPARISON:  Chest radiograph 02/26/2022 and CT abdomen 12/14/2020 FINDINGS: Despite efforts by the technologist and patient, motion artifact is present on today's exam and could not be eliminated. This reduces exam sensitivity and specificity. CTA CHEST FINDINGS Cardiovascular: No filling  defect is identified in the pulmonary arterial tree to suggest pulmonary embolus. Reduced sensitivity due to motion artifact. Coronary, aortic arch, and branch vessel atherosclerotic vascular disease. Mild cardiomegaly. Dual lead pacer noted. Mediastinum/Nodes: 1.7 cm hypodense nodule in the right thyroid lobe on image 17 series 2. In the setting of significant comorbidities or limited life expectancy, no follow-up recommended (ref: J Am Coll Radiol. 2015 Feb;12(2): 143-50).No pathologic adenopathy. Lungs/Pleura: Airspace opacities in both lower lobes suspicious for multilobar pneumonia or aspiration pneumonitis. There is some faint airspace opacity peripherally in the right upper lobe on image 47 series 4. Musculoskeletal: Degenerative sternoclavicular arthropathy with some posterior subluxation of the right clavicle along the sternoclavicular joint as shown on image 37 series 4. Thoracic spondylosis. Review of the MIP images confirms the above findings. CT ABDOMEN and PELVIS FINDINGS Hepatobiliary: Unremarkable Pancreas: Atrophic pancreas with small scattered cystic lesions or cystic dilatation of the dorsal pancreatic duct. The largest such lesion is in the pancreatic head and measures 1.2 by 1.2 cm on image 34 series 4, stable based on my measurements compared to 12/14/2020. No progression or change since the prior exam. Spleen: Unremarkable Adrenals/Urinary Tract: Both adrenal glands appear normal. Bosniak category 1 cyst of the left kidney upper pole is benign and warrants no further workup. Urinary bladder and left distal ureter partially obscured by streak artifact from the left hip implant. Stomach/Bowel: Distended rectum and sigmoid colon, with mild associated wall thickening potentially reflecting distal colitis. The degree of wall thickening is less striking than on the 12/14/2020 exam although the distal colon is substantially more distended with frothy material down on that exam. Along the proximal  margin of the dilated segment the proximal sigmoid colon demonstrates some twisting/swirling as on images 33 through 42 of series 6, along the margin of a diverticulum, but without luminal occlusion/obstruction or other significant indicator of a fulcrum for volvulus. Accordingly I favor a functional abnormality and/or distal colitis over sigmoid volvulus. 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 There is also mild wall calcification in the proximal SMA. No pathologic adenopathy. Reproductive: Prostate gland partially obscured by streak artifact from the patient's left hip implant. Other: No substantial ascites.  Trace perirectal stranding. Musculoskeletal: Left hip hemiarthroplasty. Interdigitating articulation and likely partial fusion at L4-5 and likely partial fusion at L5-S1. Left foraminal impingement at L4-5 due to intervertebral and facet spurring. Probably fused facet joints at L4-5. Mild left foraminal stenosis at L3-4 due to facet arthropathy. Review of the MIP images confirms the above findings. IMPRESSION: 1. No filling defect is identified in the pulmonary arterial tree to suggest pulmonary embolus. Reduced sensitivity due to motion artifact. 2. Airspace opacities in both lower lobes suspicious for multilobar pneumonia or aspiration pneumonitis. There is also some faint airspace opacity peripherally in the right upper lobe. 3. Distended rectum and sigmoid colon, with mild associated wall thickening potentially reflecting distal colitis. The degree of wall thickening is less striking than on the 12/14/2020 exam. There is some twisting/swirling along the proximal margin of the dilated segment of the proximal sigmoid colon, but without luminal occlusion/obstruction or other significant indicator of a fulcrum for volvulus. Accordingly I favor a functional abnormality and/or distal colitis over sigmoid volvulus. 4. Atrophic pancreas with small scattered  cystic lesions or cystic dilatation of the dorsal pancreatic duct. The largest such lesion is in the pancreatic head and measures 1.2 by 1.2 cm. No progression or change since the prior exam. Given the patient's age, follow up pancreatic protocol CT or MRI is recommended in 2 years time. 5. 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 Left foraminal impingement at L4-5 due to intervertebral and facet spurring. 6. Aortic and coronary atherosclerosis. Aortic Atherosclerosis (ICD10-I70.0). Electronically Signed   By: Van Clines M.D.   On: 02/26/2022 11:38   DG Chest Port 1 View  Result Date: 02/26/2022 CLINICAL DATA:  Evaluate sepsis EXAM: PORTABLE CHEST 1 VIEW COMPARISON:  None Available. FINDINGS: LEFT-sided pacer overlies normal cardiac silhouette. No effusion, infiltrate or pneumothorax. No acute osseous abnormality. IMPRESSION: No evidence of pulmonary infection. Electronically Signed   By: Suzy Bouchard M.D.   On: 02/26/2022 09:36    Microbiology: Results for orders placed or performed during the hospital encounter of 03/06/22  Blood Culture (routine x 2)     Status: None (Preliminary result)   Collection Time: 03/06/22 10:50 PM   Specimen: BLOOD  Result Value Ref Range Status   Specimen Description   Final    BLOOD BLOOD LEFT HAND Performed at [facility], [place] [address], [place], [place] [address]    Special Requests   Final    BOTTLES DRAWN AEROBIC ONLY Blood Culture results may not be optimal due to an inadequate volume of blood received in culture bottles Performed at [facility] [address] [place], [place] [address]    Culture   Final    NO GROWTH 4 DAYS Performed at [facility], [place] [address], [place], [place] [address]    Report Status PENDING  Incomplete  Blood Culture (routine x 2)     Status: None (Preliminary result)   Collection Time: 03/06/22 10:50 PM   Specimen: BLOOD  Result  Value Ref Range Status   Specimen Description   Final    BLOOD BLOOD RIGHT HAND Performed at [facility] [address], [place], [place] [address]    Special Requests   Final    BOTTLES DRAWN AEROBIC AND ANAEROBIC Blood Culture adequate volume Performed at [facility] [address], [place], [place] [address]    Culture   Final    NO GROWTH 4 DAYS Performed at  [facility], [place] [address] [place], [place] [address]    Report Status PENDING  Incomplete  Resp Panel by RT-PCR (Flu A&B, Covid) Anterior Nasal Swab     Status: None   Collection Time: 03/06/22 11:49 PM   Specimen: Anterior Nasal Swab  Result Value Ref Range Status   SARS Coronavirus 2 by RT PCR NEGATIVE NEGATIVE Final    Comment: (NOTE) SARS-CoV-2 target nucleic acids are NOT DETECTED.  The SARS-CoV-2 RNA is generally detectable in upper respiratory specimens during the acute phase of infection. The lowest concentration of SARS-CoV-2 viral copies this assay can detect is 138 copies/mL. A negative result does not preclude SARS-Cov-2 infection and should not be used as the sole basis for treatment or other patient management decisions. A negative result may occur with  improper specimen collection/handling, submission of specimen other than nasopharyngeal swab, presence of viral mutation(s) within the areas targeted by this assay, and inadequate number of viral copies(<138 copies/mL). A negative result must be combined with clinical observations, patient history, and epidemiological information. The expected result is Negative.  Fact Sheet for Patients:  EntrepreneurPulse.com.au  Fact Sheet for Healthcare Providers:  IncredibleEmployment.be  This test is no t yet approved or cleared by the [place] FDA and  has been authorized for detection and/or diagnosis of SARS-CoV-2 by FDA under an Emergency Use Authorization (EUA). This  EUA will remain  in effect (meaning this test can be used) for the duration of the COVID-19 declaration under Section 564(b)(1) of the Act, 21 U.S.C.section 360bbb-3(b)(1), unless the authorization is terminated  or revoked sooner.       Influenza A by PCR NEGATIVE NEGATIVE Final   Influenza B by PCR NEGATIVE NEGATIVE Final    Comment: (NOTE) The Xpert Xpress SARS-CoV-2/FLU/RSV plus assay is intended as an aid in the diagnosis of influenza from Nasopharyngeal swab specimens and should not be used as a sole basis for treatment. Nasal washings and aspirates are unacceptable for Xpert Xpress SARS-CoV-2/FLU/RSV testing.  Fact Sheet for Patients: EntrepreneurPulse.com.au  Fact Sheet for Healthcare Providers: IncredibleEmployment.be  This test is not yet approved or cleared by the [place] FDA and has been authorized for detection and/or diagnosis of SARS-CoV-2 by FDA under an Emergency Use Authorization (EUA). This EUA will remain in effect (meaning this test can be used) for the duration of the COVID-19 declaration under Section 564(b)(1) of the Act, 21 U.S.C. section 360bbb-3(b)(1), unless the authorization is terminated or revoked.  Performed at [facility], [place] [address] [place], [place] [address]   Urine Culture     Status: Abnormal   Collection Time: 03/07/22  5:25 AM   Specimen: In/Out Cath Urine  Result Value Ref Range Status   Specimen Description   Final    IN/OUT CATH URINE Performed at [facility] [address], [place], [place] [address]    Special Requests   Final    NONE Performed at [facility], [place] [address] [place], [place] [address]    Culture 20,000 COLONIES/mL YEAST (A)  Final   Report Status 03/08/2022 FINAL  Final    Labs: CBC: Recent Labs  Lab 03/05/22 0550 03/06/22 2250 03/08/22 0546 03/09/22 0528 03/10/22 0413  WBC 13.3* 13.3* 10.9*  10.7* 12.6*  NEUTROABS 9.1* 10.8*  --   --   --   HGB 11.3* 11.3* 9.6* 9.7* 9.4*  HCT 34.3* 35.6* 29.8* 29.4* 28.8*  MCV 91.5 93.0 92.0 91.3 92.9  PLT 246 345 283 285 543   Basic Metabolic Panel: Recent Labs  Lab 03/05/22 0550 03/06/22 2250 03/08/22 0546 03/09/22 0528 03/10/22 0413  NA 142 138 141 141 140  K 3.7 4.3 3.7 3.5 3.5  CL 112* 111 115* 112* 111  CO2 22 21* 19* 21* 21*  GLUCOSE 87 181* 121* 117* 108*  BUN 30* 46* 27* 25* 25*  CREATININE 0.79 1.20 0.91 0.88 0.94  CALCIUM 8.4* 9.1 8.1* 7.9* 8.0*  MG 1.9  --   --   --   --    Liver Function Tests: Recent Labs  Lab 03/06/22 2250 03/08/22 0546 03/09/22 0528 03/10/22 0413  AST _0 ALT _1 ALKPHOS 56 40 47 42  BILITOT 0.5 0.4 0.6 0.4  PROT 7.3 5.5* 5.8* 5.5*  ALBUMIN 3.5 2.6* 2.7* 2.6*   CBG: Recent Labs  Lab 03/10/22 0815 03/10/22 1201 03/10/22 1700 03/10/22 2153 03/11/22 0817  GLUCAP 116* 121* 112* 101* 86    Discharge time spent: less than 30 minutes.  Signed: Marylu Lund, MD [facility] 03/11/2022

## 2022-03-11 NOTE — TOC Progression Note (Signed)
Transition of Care (TOC) - Progression Note    Patient Details  Name: Nathan Alvarez [name] MRN: [id number] Date of Birth: March 19, 1925  Transition of Care [facility]) CM/SW Contact  Coralyn Helling, [place] Phone Number: 03/11/2022, 11:47 AM  Clinical Narrative:    DC summary and Fl2 faxed to facility. Awaiting facility to confirm patient return. TOC following for dc to facility.   Expected Discharge Plan: Assisted Living Barriers to Discharge: Continued Medical Work up  Expected Discharge Plan and Services Expected Discharge Plan: Assisted Living In-house Referral: Hospice / Palliative Care Discharge Planning Services: CM Consult Post Acute Care Choice: Hospice Living arrangements for the past 2 months: Assisted Living Facility Expected Discharge Date: 03/11/22               DME Arranged: N/A DME Agency: NA                   Social Determinants of Health (SDOH) Interventions    Readmission Risk Interventions    03/10/2022    9:54 AM 03/05/2022    1:21 PM 03/03/2022   11:51 AM  Readmission Risk Prevention Plan  Transportation Screening Complete Complete Complete  PCP or Specialist Appt within 5-7 Days   Complete  PCP or Specialist Appt within 3-5 Days Complete Complete   Home Care Screening   Complete  Medication Review (RN CM)   Complete  HRI or Home Care Consult Complete Complete   Social Work Consult for Recovery Care Planning/Counseling Complete Complete   Palliative Care Screening Complete Not Applicable   Medication Review Ocean[employer]) Complete Complete

## 2022-03-11 NOTE — NC FL2 (Signed)
[place] MEDICAID FL2 LEVEL OF CARE FORM     IDENTIFICATION  Patient Name: Nathan Route [name] Birthdate: [birth year] Sex: male Admission Date (Current Location): 03/06/2022  [facility] and [place] Number:  Producer, television/film/video and Address:  [facility],  [address] [place], [place] [address]      Provider Number: [id number]  Attending Physician Name and Address:  Jerald Kief, MD  Relative Name and Phone Number:  Hilda Lias [phone]    Current Level of Care: Hospital Recommended Level of Care: Assisted Living Facility Prior Approval Number:    Date Approved/Denied:   PASRR Number:    Discharge Plan: Other (Comment) (ALF)    Current Diagnoses: Patient Active Problem List   Diagnosis Date Noted   Hypoglycemia associated with type 2 diabetes mellitus (HCC) 03/07/2022   Hypoglycemia 03/07/2022   Sepsis (HCC) 02/26/2022   Elevated troponin 02/26/2022   GERD (gastroesophageal reflux disease) 02/26/2022   Dysphagia    Abnormal barium swallow    Acute systolic heart failure (HCC)    Influenza A 03/17/2021   Hypotension 03/17/2021   Septic shock (HCC)    Acute respiratory failure with hypoxia (HCC)    Lactic acidosis    Weakness generalized    Multifocal pneumonia 01/21/2021   Pressure injury of skin 01/21/2021   Fall    Aspiration pneumonia (HCC) 12/15/2020   AKI (acute kidney injury) (HCC) 12/14/2020   Colitis 12/14/2020   Sinus node dysfunction (HCC) 03/06/2020   Pacemaker 03/06/2020   Benign prostatic hyperplasia with nocturia 05/23/2019   Vitamin D deficiency 04/25/2019   Pathological fracture of left hip due to age-related osteoporosis (HCC) 04/25/2019   Femoral neck fracture (HCC) 04/21/2019   Hypertension 04/21/2019   Diabetes (HCC) 04/21/2019   Hyponatremia 04/21/2019   Anemia 04/21/2019   Hypertension, essential, benign 07/13/2017    Orientation RESPIRATION BLADDER Height & Weight     Self, Time, Situation  Normal Continent Weight:  136 lb 7.4 oz (61.9 kg) Height:  5\' 7"  (170.2 cm)  BEHAVIORAL SYMPTOMS/MOOD NEUROLOGICAL BOWEL NUTRITION STATUS      Continent Diet (regular)  AMBULATORY STATUS COMMUNICATION OF NEEDS Skin   Limited Assist Verbally Normal                       Personal Care Assistance Level of Assistance  Bathing, Feeding, Dressing Bathing Assistance: Maximum assistance Feeding assistance: Independent Dressing Assistance: Maximum assistance     Functional Limitations Info  Sight, Hearing, Speech Sight Info: Adequate Hearing Info: Adequate Speech Info: Adequate    SPECIAL CARE FACTORS FREQUENCY  PT (By licensed PT), OT (By licensed OT)                 Contractures Contractures Info: Not present    Additional Factors Info  Allergies, Code Status Code Status Info: DNR Allergies Info: Penicillins           Current Medications (03/11/2022):  This is the current hospital active medication list Current Facility-Administered Medications  Medication Dose Route Frequency Provider Last Rate Last Admin   acetaminophen (TYLENOL) tablet 650 mg  650 mg Oral Q6H PRN Crosley, Debby, MD       Or   acetaminophen (TYLENOL) suppository 650 mg  650 mg Rectal Q6H PRN Crosley, Debby, MD       aspirin EC tablet 81 mg  81 mg Oral Q breakfast Crosley, Debby, MD   81 mg at 03/11/22 0909   carvedilol (COREG) tablet 3.125 mg  3.125 mg Oral BID WC Crosley, Debby, MD   3.125 mg at 03/11/22 0910   cholecalciferol (VITAMIN D3) 25 MCG (1000 UNIT) tablet 2,000 Units  2,000 Units Oral Daily Gery Pray, MD   2,000 Units at 03/11/22 9798   cyanocobalamin (VITAMIN B12) tablet 2,000 mcg  2,000 mcg Oral Daily Crosley, Debby, MD   2,000 mcg at 03/11/22 0909   dextrose 5 % in lactated ringers infusion   Intravenous Continuous Jerald Kief, MD 50 mL/hr at 03/08/22 1027 Rate Change at 03/08/22 1027   feeding supplement (GLUCERNA SHAKE) (GLUCERNA SHAKE) liquid 237 mL  237 mL Oral TID BM Jerald Kief, MD   237 mL  at 03/11/22 0912   ferrous sulfate tablet 325 mg  325 mg Oral Daily Crosley, Debby, MD   325 mg at 03/11/22 0909   fluticasone (FLONASE) 50 MCG/ACT nasal spray 2 spray  2 spray Each Nare Daily Gery Pray, MD   2 spray at 03/11/22 0917   guaiFENesin (MUCINEX) 12 hr tablet 1,200 mg  1,200 mg Oral BID Gery Pray, MD   1,200 mg at 03/11/22 0917   guaiFENesin-dextromethorphan (ROBITUSSIN DM) 100-10 MG/5ML syrup 5 mL  5 mL Oral Q4H PRN Jerald Kief, MD   5 mL at 03/08/22 2235   heparin injection 5,000 Units  5,000 Units Subcutaneous Q8H Crosley, Debby, MD   5,000 Units at 03/11/22 0529   insulin aspart (novoLOG) injection 0-5 Units  0-5 Units Subcutaneous QHS Jerald Kief, MD       insulin aspart (novoLOG) injection 0-9 Units  0-9 Units Subcutaneous TID WC Jerald Kief, MD   7 Units at 03/09/22 1251   levalbuterol (XOPENEX HFA) inhaler 1 puff  1 puff Inhalation Q4H PRN Gery Pray, MD   1 puff at 03/08/22 0509   loratadine (CLARITIN) tablet 10 mg  10 mg Oral Daily Gery Pray, MD   10 mg at 03/11/22 0909   losartan (COZAAR) tablet 50 mg  50 mg Oral Daily Jerald Kief, MD   50 mg at 03/11/22 0910   multivitamin with minerals tablet 1 tablet  1 tablet Oral Daily Jerald Kief, MD   1 tablet at 03/11/22 0909   pantoprazole (PROTONIX) EC tablet 40 mg  40 mg Oral Daily Jerald Kief, MD   40 mg at 03/11/22 9211   PARoxetine (PAXIL) tablet 10 mg  10 mg Oral Daily Gery Pray, MD   10 mg at 03/11/22 9417   senna-docusate (Senokot-S) tablet 1 tablet  1 tablet Oral QHS PRN Gery Pray, MD       spironolactone (ALDACTONE) tablet 12.5 mg  12.5 mg Oral Daily Jerald Kief, MD   12.5 mg at 03/11/22 4081     Discharge Medications: STOP taking these medications     cefdinir 300 MG capsule Commonly known as: OMNICEF    Levemir FlexTouch 100 UNIT/ML FlexPen Generic drug: insulin detemir    metroNIDAZOLE 500 MG tablet Commonly known as: FLAGYL    UNABLE TO FIND            TAKE these medications     acetaminophen 325 MG tablet Commonly known as: TYLENOL Take 2 tablets (650 mg total) by mouth every 6 (six) hours as needed for mild pain (or Fever >/= 101). What changed: reasons to take this    aspirin EC 81 MG tablet Take 1 tablet (81 mg total) by mouth daily with breakfast. Swallow whole.    BAZA PROTECT EX  Apply 1 application  topically See admin instructions. Apply to affected areas of buttocks, sacrum, and genitals twice daily. May also apply as needed for each incontinence episode    Calcium Carbonate 500 MG Chew Chew 500 mg by mouth in the morning and at bedtime.    carvedilol 3.125 MG tablet Commonly known as: COREG Take 3.125 mg by mouth 2 (two) times daily with a meal.    cyanocobalamin 1000 MCG tablet Commonly known as: VITAMIN B12 Take 2,000 mcg by mouth daily.    DUODERM CGF DRESSING EX Apply 1 Application topically once a week. Apply to sacrum wound. May also change as needed when dressing is soiled.    FeroSul 325 (65 FE) MG tablet Generic drug: ferrous sulfate Take 325 mg by mouth daily.    fluticasone 50 MCG/ACT nasal spray Commonly known as: FLONASE Place 2 sprays into both nostrils daily.    guaiFENesin 600 MG 12 hr tablet Commonly known as: MUCINEX Take 2 tablets (1,200 mg total) by mouth 2 (two) times daily.    insulin lispro 100 UNIT/ML KwikPen Commonly known as: HumaLOG KwikPen Sliding Scale CBG < 70: Implement Hypoglycemia Standing Orders CBG 70 - 120: 0 units  CBG 121 - 150: 1 unit  CBG 151 - 200: 2 units  CBG 201 - 250: 3 units  CBG 251 - 300: 5 units  CBG 301 - 350: 7 units  CBG 351 - 400: 9 units  CBG > 400: Please notify facility MD What changed:  how much to take how to take this when to take this reasons to take this additional instructions    Insulin Pen Needle 31G X 5 MM Misc 1 Device by Does not apply route 3 (three) times daily between meals as needed. For use with insulin pens     levalbuterol 45 MCG/ACT inhaler Commonly known as: XOPENEX HFA Inhale 1 puff into the lungs every 4 (four) hours as needed for wheezing.    loratadine 10 MG tablet Commonly known as: CLARITIN Take 1 tablet (10 mg total) by mouth daily.    losartan 50 MG tablet Commonly known as: COZAAR Take 1 tablet (50 mg total) by mouth daily.    nystatin ointment Commonly known as: MYCOSTATIN Apply 1 Application topically 2 (two) times daily. To groin area    pantoprazole 40 MG tablet Commonly known as: PROTONIX Take 1 tablet (40 mg total) by mouth daily.    PARoxetine 10 MG tablet Commonly known as: PAXIL Take 10 mg by mouth daily.    spironolactone 25 MG tablet Commonly known as: ALDACTONE Take 0.5 tablets (12.5 mg total) by mouth daily.    Vitamin D3 50 MCG (2000 UT) Tabs Take 2,000 Units by mouth daily.      Relevant Imaging Results:  Relevant Lab Results:   Additional Information SSN: [id number], Hospice to follow  Coralyn Helling, LCSW

## 2022-03-11 NOTE — Plan of Care (Signed)
Patient is discharging today, he is stable and in no distress.

## 2022-03-12 LAB — CULTURE, BLOOD (ROUTINE X 2)
Culture: NO GROWTH
Culture: NO GROWTH
Special Requests: ADEQUATE

## 2022-04-07 DEATH — deceased

## 2022-07-17 ENCOUNTER — Ambulatory Visit: Payer: Medicare Other

## 2023-07-04 IMAGING — RF DG ESOPHAGUS
9 of 12 series · 13 of 24 positions shown · non-contrast
Comparison: None.

CLINICAL DATA: Difficulty swallowing

EXAM:
ESOPHOGRAM/BARIUM SWALLOW
TECHNIQUE: Single contrast examination was performed using  thin barium.
FLUOROSCOPY TIME:  Fluoroscopy Time:  2 minutes 54 seconds
Radiation Exposure Index (if provided by the fluoroscopic device):
12.0 mGy
Number of Acquired Spot Images: 7 series, 346 images. This includes
multiple cine clips.

[Series 1: cp_standard · 0.55mm/px · 2 of 90 frames shown (1 of 9)]
[frame 11/90]
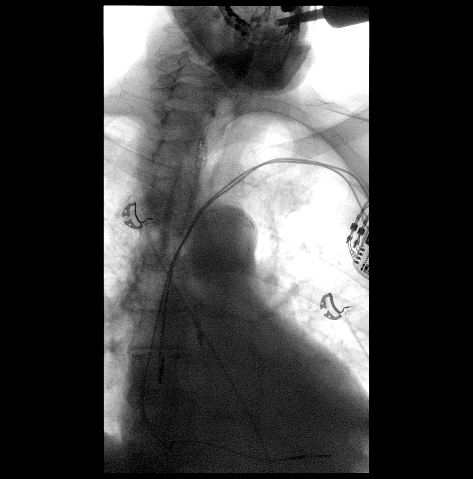
[frame 77/90]
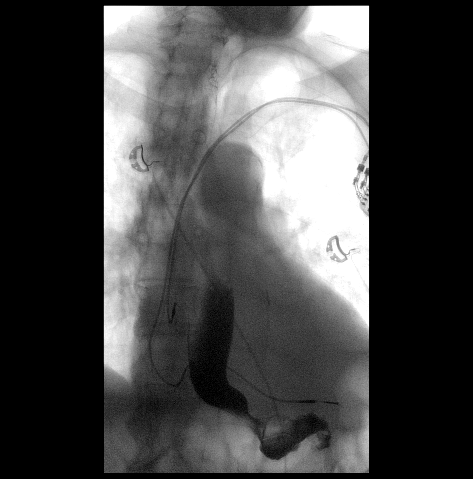

[Series 3: cp_standard · 0.19mm/px · 1 of 1 slices shown (2 of 9)]
[im 1/1]
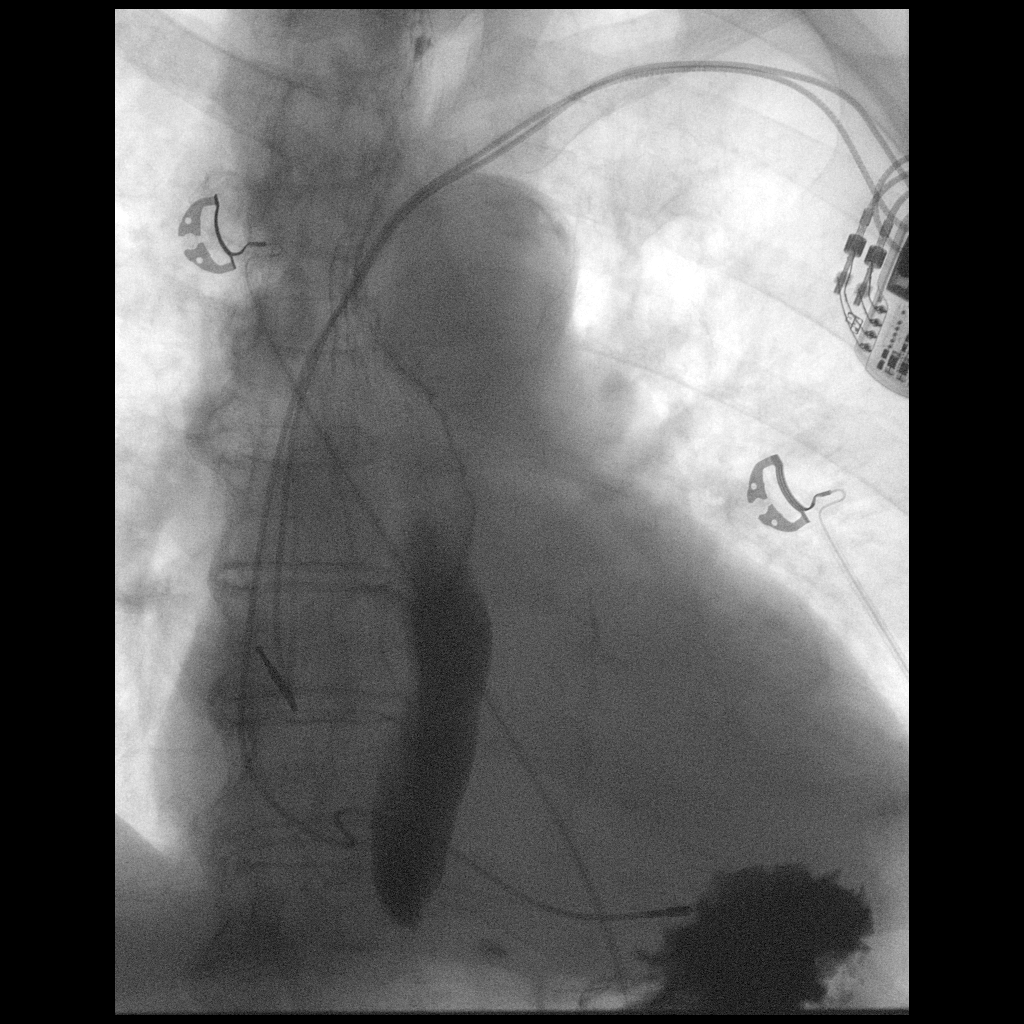

[Series 4: cp_standard · 0.39mm/px · 1 of 22 frames shown (3 of 9)]
[frame 19/22]
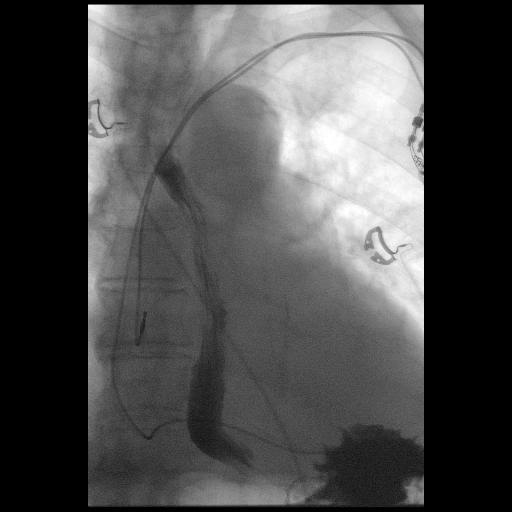

[Series 5: cp_standard · 0.38mm/px · 2 of 90 frames shown (4 of 9)]
[frame 14/90]
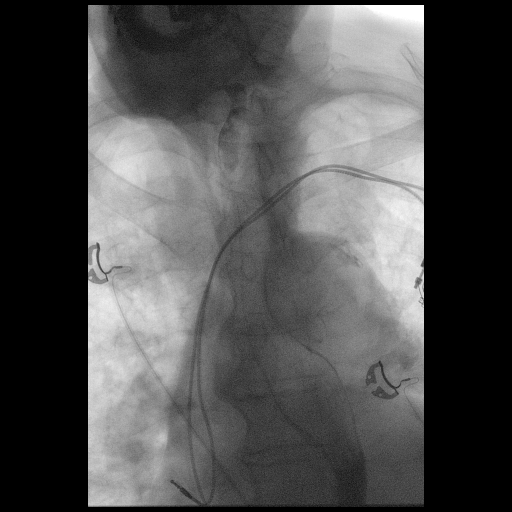
[frame 77/90]
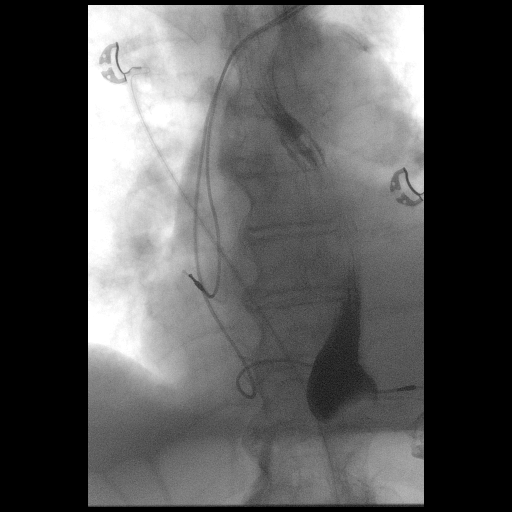

[Series 8: cp_standard · 0.38mm/px · 2 of 29 frames shown (5 of 9)]
[frame 5/29]
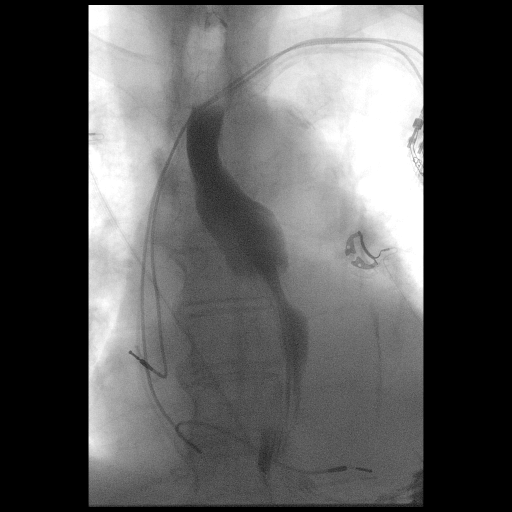
[frame 25/29]
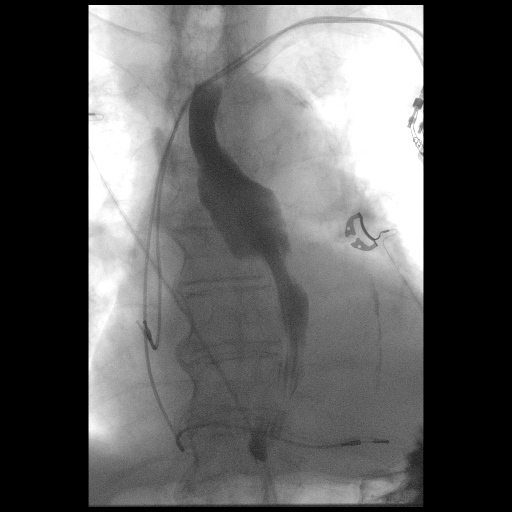

[Series 9: cp_standard · 0.38mm/px · 1 of 45 frames shown (6 of 9)]
[frame 23/45]
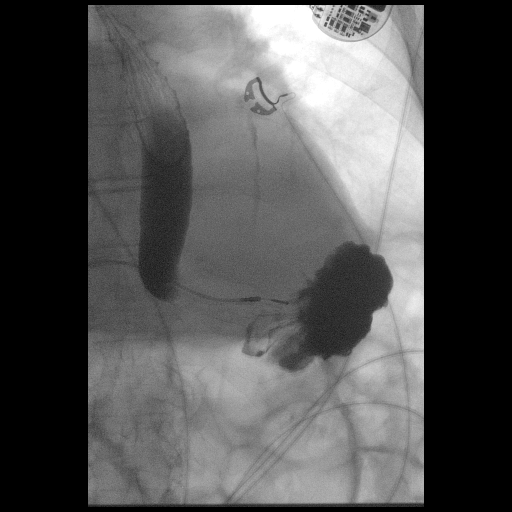

[Series 11: cp_standard · 0.57mm/px · 2 of 23 frames shown (7 of 9)]
[frame 4/23]
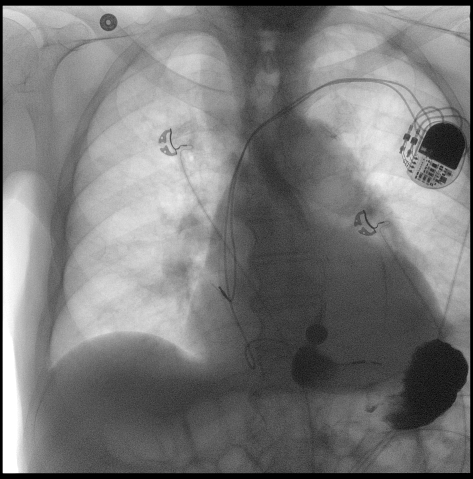
[frame 20/23]
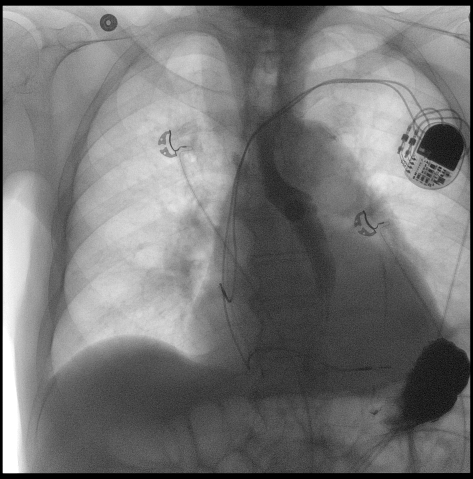

[Series 12: cp_standard · 0.38mm/px · 1 of 42 frames shown (8 of 9)]
[frame 7/42]
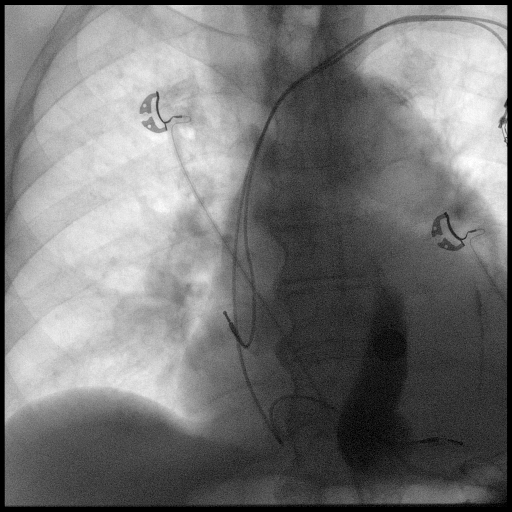

[Series 13: cp_standard · 0.18mm/px · 1 of 1 slices shown (9 of 9)]
[im 1/1]
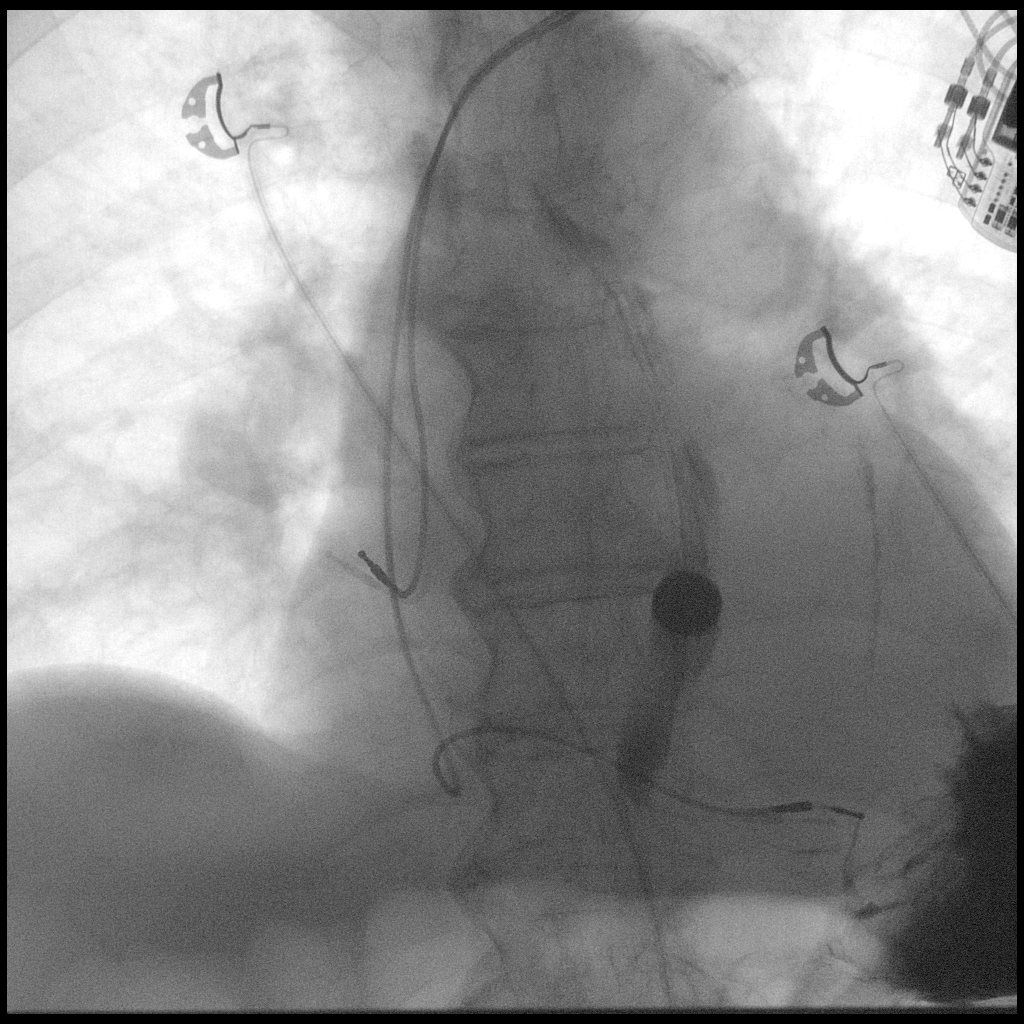

[13 of 24 positions shown; findings below may reference images not displayed]

FINDINGS: There is severe esophageal dysmotility with delayed distal
progression of contrast into the stomach.

No visible ulceration. Small hiatal hernia. Gastroesophageal reflux
occurred to the midesophagus.

A 13 mm barium tablet became lodged in the distal esophagus just
above the GE junction and did not pass within 10 minutes. Esophageal
dysmotility took the pill to and fro from the mid to distal
esophagus.
IMPRESSION: Severe esophageal dysmotility.

Small hiatal hernia. Gastroesophageal reflux occurred to the
midesophagus.

A 13 mm barium tablet stuck in the distal esophagus just above the
GE junction and did not pass within 10 minutes. This is favored to
be due to severe esophageal dysmotility, however a distal esophageal
stricture cannot be excluded.
